# Patient Record
Sex: Female | Born: 1950 | Race: White | Hispanic: No | Smoking: Never smoker
Health system: Southern US, Community
[De-identification: ages and names within clinical notes are randomized; demographics above are authoritative.]

## PROBLEM LIST (undated history)

## (undated) DIAGNOSIS — R739 Hyperglycemia, unspecified: Secondary | ICD-10-CM

## (undated) DIAGNOSIS — I10 Essential (primary) hypertension: Secondary | ICD-10-CM

## (undated) DIAGNOSIS — I4891 Unspecified atrial fibrillation: Secondary | ICD-10-CM

## (undated) DIAGNOSIS — I5032 Chronic diastolic (congestive) heart failure: Secondary | ICD-10-CM

## (undated) DIAGNOSIS — N390 Urinary tract infection, site not specified: Secondary | ICD-10-CM

## (undated) DIAGNOSIS — I517 Cardiomegaly: Secondary | ICD-10-CM

## (undated) DIAGNOSIS — N200 Calculus of kidney: Secondary | ICD-10-CM

## (undated) DIAGNOSIS — E119 Type 2 diabetes mellitus without complications: Secondary | ICD-10-CM

## (undated) DIAGNOSIS — E039 Hypothyroidism, unspecified: Secondary | ICD-10-CM

## (undated) DIAGNOSIS — E663 Overweight: Secondary | ICD-10-CM

## (undated) HISTORY — DX: Calculus of kidney: N20.0

## (undated) HISTORY — DX: Urinary tract infection, site not specified: N39.0

## (undated) HISTORY — PX: THYROIDECTOMY: SHX17

## (undated) HISTORY — DX: Overweight: E66.3

## (undated) HISTORY — DX: Essential (primary) hypertension: I10

---

## 2001-06-14 ENCOUNTER — Ambulatory Visit (HOSPITAL_COMMUNITY): Admission: RE | Admit: 2001-06-14 | Discharge: 2001-06-14 | Payer: Self-pay | Admitting: Internal Medicine

## 2001-06-14 ENCOUNTER — Encounter: Payer: Self-pay | Admitting: Internal Medicine

## 2001-07-16 ENCOUNTER — Encounter: Payer: Self-pay | Admitting: Internal Medicine

## 2001-07-16 ENCOUNTER — Ambulatory Visit (HOSPITAL_COMMUNITY): Admission: RE | Admit: 2001-07-16 | Discharge: 2001-07-16 | Payer: Self-pay | Admitting: Internal Medicine

## 2001-07-18 ENCOUNTER — Encounter: Payer: Self-pay | Admitting: Urology

## 2001-07-18 ENCOUNTER — Ambulatory Visit (HOSPITAL_COMMUNITY): Admission: RE | Admit: 2001-07-18 | Discharge: 2001-07-18 | Payer: Self-pay | Admitting: Urology

## 2001-07-25 ENCOUNTER — Encounter: Payer: Self-pay | Admitting: Urology

## 2001-07-25 ENCOUNTER — Ambulatory Visit (HOSPITAL_COMMUNITY): Admission: RE | Admit: 2001-07-25 | Discharge: 2001-07-25 | Payer: Self-pay | Admitting: *Deleted

## 2001-07-30 ENCOUNTER — Encounter: Payer: Self-pay | Admitting: Urology

## 2001-07-30 ENCOUNTER — Ambulatory Visit (HOSPITAL_COMMUNITY): Admission: RE | Admit: 2001-07-30 | Discharge: 2001-07-30 | Payer: Self-pay | Admitting: Urology

## 2001-08-22 ENCOUNTER — Ambulatory Visit (HOSPITAL_COMMUNITY): Admission: RE | Admit: 2001-08-22 | Discharge: 2001-08-22 | Payer: Self-pay | Admitting: Urology

## 2001-08-22 ENCOUNTER — Encounter: Payer: Self-pay | Admitting: Urology

## 2001-08-24 ENCOUNTER — Ambulatory Visit (HOSPITAL_COMMUNITY): Admission: RE | Admit: 2001-08-24 | Discharge: 2001-08-24 | Payer: Self-pay | Admitting: Urology

## 2001-08-24 ENCOUNTER — Encounter: Payer: Self-pay | Admitting: Urology

## 2001-10-26 ENCOUNTER — Ambulatory Visit (HOSPITAL_COMMUNITY): Admission: RE | Admit: 2001-10-26 | Discharge: 2001-10-26 | Payer: Self-pay | Admitting: Urology

## 2001-10-26 ENCOUNTER — Encounter: Payer: Self-pay | Admitting: Urology

## 2002-06-14 ENCOUNTER — Ambulatory Visit (HOSPITAL_COMMUNITY): Admission: RE | Admit: 2002-06-14 | Discharge: 2002-06-14 | Payer: Self-pay | Admitting: Internal Medicine

## 2002-06-14 ENCOUNTER — Encounter: Payer: Self-pay | Admitting: Internal Medicine

## 2004-10-03 ENCOUNTER — Emergency Department (HOSPITAL_COMMUNITY): Admission: EM | Admit: 2004-10-03 | Discharge: 2004-10-03 | Payer: Self-pay | Admitting: Emergency Medicine

## 2008-08-26 ENCOUNTER — Ambulatory Visit (HOSPITAL_COMMUNITY): Admission: RE | Admit: 2008-08-26 | Discharge: 2008-08-26 | Payer: Self-pay | Admitting: Cardiology

## 2008-08-26 ENCOUNTER — Ambulatory Visit: Payer: Self-pay | Admitting: Cardiology

## 2008-08-27 ENCOUNTER — Ambulatory Visit: Payer: Self-pay | Admitting: Cardiology

## 2008-08-29 ENCOUNTER — Ambulatory Visit: Payer: Self-pay | Admitting: Cardiology

## 2008-09-04 ENCOUNTER — Ambulatory Visit: Payer: Self-pay | Admitting: Cardiology

## 2008-09-05 ENCOUNTER — Encounter: Payer: Self-pay | Admitting: Cardiology

## 2008-09-05 ENCOUNTER — Ambulatory Visit (HOSPITAL_COMMUNITY): Admission: RE | Admit: 2008-09-05 | Discharge: 2008-09-05 | Payer: Self-pay | Admitting: Cardiology

## 2008-09-05 ENCOUNTER — Ambulatory Visit: Payer: Self-pay | Admitting: Cardiology

## 2008-09-10 ENCOUNTER — Ambulatory Visit: Payer: Self-pay | Admitting: Cardiology

## 2008-09-19 ENCOUNTER — Ambulatory Visit: Payer: Self-pay | Admitting: Cardiology

## 2008-10-02 ENCOUNTER — Ambulatory Visit: Payer: Self-pay | Admitting: Cardiology

## 2008-10-13 ENCOUNTER — Ambulatory Visit: Payer: Self-pay | Admitting: Cardiology

## 2008-10-29 ENCOUNTER — Ambulatory Visit: Payer: Self-pay | Admitting: Cardiology

## 2008-11-03 ENCOUNTER — Ambulatory Visit: Payer: Self-pay | Admitting: Cardiology

## 2008-11-10 ENCOUNTER — Ambulatory Visit: Payer: Self-pay | Admitting: Cardiology

## 2008-11-27 ENCOUNTER — Ambulatory Visit: Payer: Self-pay | Admitting: Cardiology

## 2008-12-08 ENCOUNTER — Ambulatory Visit: Payer: Self-pay | Admitting: Cardiology

## 2008-12-17 ENCOUNTER — Ambulatory Visit (HOSPITAL_COMMUNITY): Admission: RE | Admit: 2008-12-17 | Discharge: 2008-12-17 | Payer: Self-pay | Admitting: Cardiology

## 2008-12-17 ENCOUNTER — Ambulatory Visit: Payer: Self-pay | Admitting: Cardiology

## 2008-12-25 ENCOUNTER — Ambulatory Visit: Payer: Self-pay | Admitting: Cardiology

## 2009-01-14 ENCOUNTER — Ambulatory Visit: Payer: Self-pay | Admitting: Cardiology

## 2009-01-22 ENCOUNTER — Ambulatory Visit: Payer: Self-pay | Admitting: Cardiology

## 2009-02-18 ENCOUNTER — Ambulatory Visit: Payer: Self-pay | Admitting: Cardiology

## 2009-03-17 ENCOUNTER — Encounter (INDEPENDENT_AMBULATORY_CARE_PROVIDER_SITE_OTHER): Payer: Self-pay | Admitting: *Deleted

## 2009-03-17 LAB — CONVERTED CEMR LAB
BUN: 27 mg/dL
CO2: 26 meq/L
Calcium: 8.9 mg/dL
Chloride: 104 meq/L
Creatinine, Ser: 0.85 mg/dL
Glucose, Bld: 167 mg/dL
Potassium: 4.3 meq/L
Sodium: 141 meq/L

## 2009-03-19 ENCOUNTER — Ambulatory Visit: Payer: Self-pay | Admitting: Cardiology

## 2009-04-20 ENCOUNTER — Ambulatory Visit: Payer: Self-pay | Admitting: Cardiology

## 2009-05-05 DIAGNOSIS — E1122 Type 2 diabetes mellitus with diabetic chronic kidney disease: Secondary | ICD-10-CM

## 2009-05-05 DIAGNOSIS — N200 Calculus of kidney: Secondary | ICD-10-CM

## 2009-05-05 DIAGNOSIS — N183 Chronic kidney disease, stage 3 (moderate): Secondary | ICD-10-CM

## 2009-05-05 DIAGNOSIS — I10 Essential (primary) hypertension: Secondary | ICD-10-CM

## 2009-05-05 DIAGNOSIS — N390 Urinary tract infection, site not specified: Secondary | ICD-10-CM | POA: Insufficient documentation

## 2009-05-05 DIAGNOSIS — I482 Chronic atrial fibrillation, unspecified: Secondary | ICD-10-CM

## 2009-05-05 DIAGNOSIS — Z9089 Acquired absence of other organs: Secondary | ICD-10-CM

## 2009-05-05 DIAGNOSIS — E1165 Type 2 diabetes mellitus with hyperglycemia: Secondary | ICD-10-CM

## 2009-05-05 DIAGNOSIS — Z6841 Body Mass Index (BMI) 40.0 and over, adult: Secondary | ICD-10-CM

## 2009-05-07 ENCOUNTER — Ambulatory Visit: Payer: Self-pay | Admitting: Cardiology

## 2009-06-08 ENCOUNTER — Ambulatory Visit: Payer: Self-pay | Admitting: Cardiology

## 2009-06-08 ENCOUNTER — Encounter: Payer: Self-pay | Admitting: *Deleted

## 2009-06-08 LAB — CONVERTED CEMR LAB: POC INR: 4.5

## 2009-06-15 ENCOUNTER — Ambulatory Visit: Payer: Self-pay

## 2009-06-15 LAB — CONVERTED CEMR LAB: POC INR: 2.4

## 2009-07-01 ENCOUNTER — Ambulatory Visit: Payer: Self-pay | Admitting: Cardiology

## 2009-07-01 LAB — CONVERTED CEMR LAB: POC INR: 2.8

## 2009-07-23 ENCOUNTER — Ambulatory Visit: Payer: Self-pay | Admitting: Cardiology

## 2009-07-23 LAB — CONVERTED CEMR LAB: POC INR: 2.6

## 2009-08-04 ENCOUNTER — Encounter (INDEPENDENT_AMBULATORY_CARE_PROVIDER_SITE_OTHER): Payer: Self-pay | Admitting: *Deleted

## 2009-08-07 ENCOUNTER — Telehealth (INDEPENDENT_AMBULATORY_CARE_PROVIDER_SITE_OTHER): Payer: Self-pay | Admitting: *Deleted

## 2009-08-20 ENCOUNTER — Ambulatory Visit: Payer: Self-pay | Admitting: Cardiology

## 2009-08-20 LAB — CONVERTED CEMR LAB: POC INR: 3.2

## 2009-08-26 ENCOUNTER — Encounter: Payer: Self-pay | Admitting: Cardiology

## 2009-08-26 ENCOUNTER — Encounter (INDEPENDENT_AMBULATORY_CARE_PROVIDER_SITE_OTHER): Payer: Self-pay | Admitting: *Deleted

## 2009-09-16 ENCOUNTER — Ambulatory Visit: Payer: Self-pay | Admitting: Cardiology

## 2009-09-16 LAB — CONVERTED CEMR LAB: POC INR: 2.4

## 2009-10-14 ENCOUNTER — Ambulatory Visit: Payer: Self-pay | Admitting: Cardiology

## 2009-10-14 LAB — CONVERTED CEMR LAB: POC INR: 2.2

## 2009-11-12 ENCOUNTER — Ambulatory Visit: Payer: Self-pay | Admitting: Cardiology

## 2009-11-12 LAB — CONVERTED CEMR LAB: POC INR: 1.9

## 2009-12-01 ENCOUNTER — Encounter (INDEPENDENT_AMBULATORY_CARE_PROVIDER_SITE_OTHER): Payer: Self-pay | Admitting: *Deleted

## 2009-12-10 ENCOUNTER — Ambulatory Visit: Payer: Self-pay | Admitting: Cardiology

## 2009-12-10 ENCOUNTER — Encounter: Payer: Self-pay | Admitting: Cardiology

## 2009-12-10 LAB — CONVERTED CEMR LAB: POC INR: 2.2

## 2010-01-06 ENCOUNTER — Ambulatory Visit: Payer: Self-pay | Admitting: Cardiology

## 2010-01-06 LAB — CONVERTED CEMR LAB: POC INR: 2.6

## 2010-01-14 ENCOUNTER — Telehealth (INDEPENDENT_AMBULATORY_CARE_PROVIDER_SITE_OTHER): Payer: Self-pay

## 2010-02-04 ENCOUNTER — Ambulatory Visit: Payer: Self-pay | Admitting: Cardiology

## 2010-02-04 LAB — CONVERTED CEMR LAB: POC INR: 2.4

## 2010-03-04 ENCOUNTER — Ambulatory Visit: Payer: Self-pay | Admitting: Cardiology

## 2010-03-04 LAB — CONVERTED CEMR LAB: POC INR: 2.5

## 2010-03-31 ENCOUNTER — Ambulatory Visit: Payer: Self-pay | Admitting: Cardiology

## 2010-03-31 ENCOUNTER — Telehealth (INDEPENDENT_AMBULATORY_CARE_PROVIDER_SITE_OTHER): Payer: Self-pay | Admitting: *Deleted

## 2010-03-31 LAB — CONVERTED CEMR LAB: POC INR: 1.9

## 2010-04-01 ENCOUNTER — Encounter (INDEPENDENT_AMBULATORY_CARE_PROVIDER_SITE_OTHER): Payer: Self-pay | Admitting: *Deleted

## 2010-04-02 ENCOUNTER — Encounter (INDEPENDENT_AMBULATORY_CARE_PROVIDER_SITE_OTHER): Payer: Self-pay | Admitting: *Deleted

## 2010-04-02 DIAGNOSIS — E876 Hypokalemia: Secondary | ICD-10-CM | POA: Insufficient documentation

## 2010-04-07 ENCOUNTER — Telehealth (INDEPENDENT_AMBULATORY_CARE_PROVIDER_SITE_OTHER): Payer: Self-pay | Admitting: *Deleted

## 2010-04-14 ENCOUNTER — Encounter: Payer: Self-pay | Admitting: Cardiology

## 2010-04-15 LAB — CONVERTED CEMR LAB
BUN: 22 mg/dL (ref 6–23)
CO2: 26 meq/L (ref 19–32)
Calcium: 8.9 mg/dL (ref 8.4–10.5)
Chloride: 102 meq/L (ref 96–112)
Creatinine, Ser: 1.05 mg/dL (ref 0.40–1.20)
Glucose, Bld: 256 mg/dL — ABNORMAL HIGH (ref 70–99)
Potassium: 4.1 meq/L (ref 3.5–5.3)
Sodium: 139 meq/L (ref 135–145)

## 2010-04-28 ENCOUNTER — Ambulatory Visit: Payer: Self-pay | Admitting: Cardiovascular Disease

## 2010-04-28 LAB — CONVERTED CEMR LAB: POC INR: 2.6

## 2010-06-02 ENCOUNTER — Ambulatory Visit: Payer: Self-pay | Admitting: Cardiology

## 2010-06-02 LAB — CONVERTED CEMR LAB: POC INR: 1.8

## 2010-06-14 ENCOUNTER — Ambulatory Visit: Payer: Self-pay | Admitting: Cardiology

## 2010-06-14 DIAGNOSIS — E785 Hyperlipidemia, unspecified: Secondary | ICD-10-CM

## 2010-06-23 ENCOUNTER — Ambulatory Visit: Payer: Self-pay | Admitting: Cardiology

## 2010-06-23 LAB — CONVERTED CEMR LAB: POC INR: 2.5

## 2010-06-25 LAB — CONVERTED CEMR LAB
BUN: 22 mg/dL (ref 6–23)
CO2: 30 meq/L (ref 19–32)
Calcium: 8.3 mg/dL — ABNORMAL LOW (ref 8.4–10.5)
Chloride: 103 meq/L (ref 96–112)
Creatinine, Ser: 1.15 mg/dL (ref 0.40–1.20)
Glucose, Bld: 254 mg/dL — ABNORMAL HIGH (ref 70–99)
Potassium: 4 meq/L (ref 3.5–5.3)
Sodium: 145 meq/L (ref 135–145)

## 2010-07-21 ENCOUNTER — Ambulatory Visit: Payer: Self-pay | Admitting: Cardiology

## 2010-07-21 LAB — CONVERTED CEMR LAB: POC INR: 2

## 2010-07-28 LAB — CONVERTED CEMR LAB
ALT: 15 units/L (ref 0–35)
AST: 13 units/L (ref 0–37)
Albumin: 3.7 g/dL (ref 3.5–5.2)
Alkaline Phosphatase: 77 units/L (ref 39–117)
BUN: 21 mg/dL (ref 6–23)
CO2: 26 meq/L (ref 19–32)
Calcium: 8.8 mg/dL (ref 8.4–10.5)
Chloride: 105 meq/L (ref 96–112)
Cholesterol: 212 mg/dL — ABNORMAL HIGH (ref 0–200)
Creatinine, Ser: 1.01 mg/dL (ref 0.40–1.20)
Glucose, Bld: 228 mg/dL — ABNORMAL HIGH (ref 70–99)
HDL: 43 mg/dL (ref >39)
LDL Cholesterol: 136 mg/dL — ABNORMAL HIGH (ref 0–99)
Potassium: 3.8 meq/L (ref 3.5–5.3)
Sodium: 139 meq/L (ref 135–145)
Total Bilirubin: 0.5 mg/dL (ref 0.3–1.2)
Total CHOL/HDL Ratio: 4.9
Total Protein: 6.2 g/dL (ref 6.0–8.3)
Triglycerides: 166 mg/dL — ABNORMAL HIGH (ref <150)
VLDL: 33 mg/dL (ref 0–40)

## 2010-08-18 ENCOUNTER — Ambulatory Visit: Payer: Self-pay | Admitting: Cardiology

## 2010-08-18 LAB — CONVERTED CEMR LAB: POC INR: 2.2

## 2010-09-02 ENCOUNTER — Telehealth (INDEPENDENT_AMBULATORY_CARE_PROVIDER_SITE_OTHER): Payer: Self-pay | Admitting: *Deleted

## 2010-09-15 ENCOUNTER — Ambulatory Visit: Payer: Self-pay

## 2010-09-15 LAB — CONVERTED CEMR LAB: POC INR: 2.2

## 2010-09-23 ENCOUNTER — Telehealth (INDEPENDENT_AMBULATORY_CARE_PROVIDER_SITE_OTHER): Payer: Self-pay

## 2010-09-23 ENCOUNTER — Ambulatory Visit: Payer: Self-pay | Admitting: Cardiology

## 2010-09-23 ENCOUNTER — Encounter (INDEPENDENT_AMBULATORY_CARE_PROVIDER_SITE_OTHER): Payer: Self-pay | Admitting: *Deleted

## 2010-10-13 ENCOUNTER — Ambulatory Visit: Payer: Self-pay | Admitting: Cardiology

## 2010-10-13 LAB — CONVERTED CEMR LAB: POC INR: 2.4

## 2010-11-10 ENCOUNTER — Ambulatory Visit: Admission: RE | Admit: 2010-11-10 | Discharge: 2010-11-10 | Payer: Self-pay | Source: Home / Self Care

## 2010-11-10 LAB — CONVERTED CEMR LAB: POC INR: 1.9

## 2010-11-14 ENCOUNTER — Encounter: Payer: Self-pay | Admitting: Urology

## 2010-11-23 NOTE — Medication Information (Signed)
Summary: ccr-lr  Anticoagulant Therapy  Managed by: Vashti Hey, RN PCP: Dr.Mark Judene Companion MD: Dietrich Pates MD, Molly Maduro Indication 1: Atrial Fibrillation Lab Used: [facility] [place] Site: [place] INR POC 2.5  Dietary changes: no    Health status changes: no    Bleeding/hemorrhagic complications: no    Recent/future hospitalizations: no    Any changes in medication regimen? no    Recent/future dental: no  Any missed doses?: no       Is patient compliant with meds? yes       Allergies: 1)  ! Penicillin  Anticoagulation Management History:      The patient is taking warfarin and comes in today for a routine follow up visit.  Positive risk factors for bleeding include presence of serious comorbidities.  Negative risk factors for bleeding include an age less than 53 years old.  The bleeding index is 'intermediate risk'.  Positive CHADS2 values include History of HTN and History of Diabetes.  Negative CHADS2 values include Age > 31 years old.  The start date was 08/27/2008.  Anticoagulation responsible provider: Dietrich Pates MD, Molly Maduro.  INR POC: 2.5.  Cuvette Lot#: [id number].  Exp: 10/11.    Anticoagulation Management Assessment/Plan:      The patient's current anticoagulation dose is Warfarin sodium 2.5 mg tabs: take as directed by coumadin clinic.  The target INR is 2.0-3.0.  The next INR is due 03/31/2010.  Anticoagulation instructions were given to patient.  Results were reviewed/authorized by Vashti Hey, RN.  She was notified by Vashti Hey RN.         Prior Anticoagulation Instructions: INR 2.4 Continue coumadin 5mg  once daily except 2.5mg  on Mondays and Fridays  Current Anticoagulation Instructions: INR 2.5 Continue coumadin 5mg  once daily except 2.5mg  on Mondays and Fridays Prescriptions: TOPROL XL 200 MG XR24H-TAB (METOPROLOL SUCCINATE) take 1 tab daily  #30 x 6   Entered by:   Vashti Hey RN   Authorized by:   Kathlen Brunswick, MD,  [facility]   Signed by:   Vashti Hey RN on 03/04/2010   Method used:   Electronically to        [facility]  [address] (retail)       [address]       [place], [place]  [address]       Ph: [phone]       Fax: [phone]   RxID:   [id number]

## 2010-11-23 NOTE — Assessment & Plan Note (Signed)
Summary: 6 mth f/u per checkout on 05/07/09/tg  Medications Added WARFARIN SODIUM 2.5 MG TABS (WARFARIN SODIUM) take as directed by coumadin clinic DILTIAZEM HCL ER BEADS 360 MG XR24H-CAP (DILTIAZEM HCL ER BEADS) take 1 tablet by mouth once daily TOPROL XL 200 MG XR24H-TAB (METOPROLOL SUCCINATE) take 1 tab daily GLIMEPIRIDE 2 MG TABS (GLIMEPIRIDE) take 1 tab daily METFORMIN HCL 500 MG TABS (METFORMIN HCL) take 1 tab two times a day LEVOTHYROXINE SODIUM 175 MCG TABS (LEVOTHYROXINE SODIUM) take 1 tab daily TRIAMTERENE-HCTZ 37.5-25 MG TABS (TRIAMTERENE-HCTZ) take 1 tab daily      Allergies Added:   Visit Type:  Follow-up Primary Provider:  Dr.Mark Nobie Putnam  CC:  no complaints today.  History of Present Illness: Amber Armstrong comes in today for further management of her history of atrial fibrillation, hypertension, obesity, mild left ventricular hypertrophy, and anticoagulation.  She's having no significant complaints today. She specifically denies any palpitations, chest pain, increased shortness of breath, orthopnea, or peripheral edema. She checks her blood pressure work and is running about 160  Current Medications (verified): 1)  Warfarin Sodium 2.5 Mg Tabs (Warfarin Sodium) .... Take As Directed By Coumadin Clinic 2)  Diltiazem Hcl Er Beads 360 Mg Xr24h-Cap (Diltiazem Hcl Er Beads) .... Take 1 Tablet By Mouth Once Daily 3)  Toprol Xl 200 Mg Xr24h-Tab (Metoprolol Succinate) .... Take 1 Tab Daily 4)  Glimepiride 2 Mg Tabs (Glimepiride) .... Take 1 Tab Daily 5)  Metformin Hcl 500 Mg Tabs (Metformin Hcl) .... Take 1 Tab Two Times A Day 6)  Levothyroxine Sodium 175 Mcg Tabs (Levothyroxine Sodium) .... Take 1 Tab Daily 7)  Triamterene-Hctz 37.5-25 Mg Tabs (Triamterene-Hctz) .... Take 1 Tab Daily  Allergies (verified): 1)  ! Penicillin  Past History:  Past Medical History: Last updated: 2009/05/15 Current Problems:  NEPHROLITHIASIS (ICD-592.0) HYPERTENSION (ICD-401.9) DIABETES  MELLITUS, TYPE II (ICD-250.00) THYROIDECTOMY, HX OF (ICD-V45.79) OVERWEIGHT (ICD-278.02) UTI (ICD-599.0) FIBRILLATION, ATRIAL (ICD-427.31)  Past Surgical History: Last updated: May 15, 2009 thyroidectomy  Family History: Last updated: 05-15-2009 Father:deceased due to alzhimers Mother:alive with afib  Social History: Last updated: 05/15/2009 Full Time pt works at [employer]  Tobacco Use - No.  Alcohol Use - no Regular Exercise - no Drug Use - no pt has no children  Risk Factors: Exercise: no (2009-05-15)  Risk Factors: Smoking Status: never (2009/05/15)  Review of Systems       negative other than history of present illness  Vital Signs:  Patient profile:   60 year old female Height:      62 inches Weight:      206 pounds BMI:     37.81 Pulse rate:   71 / minute BP sitting:   168 / 88  (right arm)  Vitals Entered By: Dreama Saa, CNA (December 10, 2009 11:02 AM)  Physical Exam  General:  obese.   Head:  normocephalic and atraumatic Eyes:  PERRLA/EOM intact; conjunctiva and lids normal. Mouth:  poor dentition.   Neck:  Neck supple, no JVD. No masses, thyromegaly or abnormal cervical nodes. Lungs:  Clear bilaterally to auscultation and percussion. Heart:  irregular rate and rhythm, variable S1-S2, PMI poorly appreciated Msk:  decreased ROM.   Pulses:  pulses normal in all 4 extremities Extremities:  No clubbing or cyanosis.trace left pedal edema and trace right pedal edema.   Neurologic:  Alert and oriented x 3. Skin:  Intact without lesions or rashes. Psych:  Normal affect.   Impression & Recommendations:  Problem # 1:  FIBRILLATION,  ATRIAL (ICD-427.31) Assessment Unchanged  Her updated medication list for this problem includes:    Warfarin Sodium 2.5 Mg Tabs (Warfarin sodium) .Marland Kitchen... Take as directed by coumadin clinic    Toprol Xl 200 Mg Xr24h-tab (Metoprolol succinate) .Marland Kitchen... Take 1 tab daily  Problem # 2:  HYPERTENSION  (ICD-401.9) Assessment: Deteriorated Herblood pressure running high. She is taking diltiazem extended release 180 twice a day. We'll change to 360 q.a.m. She return in about 2 weeks for blood pressure check. Her updated medication list for this problem includes:    Diltiazem Hcl Er Beads 360 Mg Xr24h-cap (Diltiazem hcl er beads) .Marland Kitchen... Take 1 tablet by mouth once daily    Toprol Xl 200 Mg Xr24h-tab (Metoprolol succinate) .Marland Kitchen... Take 1 tab daily    Triamterene-hctz 37.5-25 Mg Tabs (Triamterene-hctz) .Marland Kitchen... Take 1 tab daily  Patient Instructions: 1)  Your physician recommends that you schedule a follow-up appointment in: 6 months 2)  Your physician has recommended you make the following change in your medication: Increase Diltiazem to 360mg  by mouth once daily  Prescriptions: DILTIAZEM HCL ER BEADS 360 MG XR24H-CAP (DILTIAZEM HCL ER BEADS) take 1 tablet by mouth once daily  #30 x 6   Entered by:   Larita Fife Via LPN   Authorized by:   Gaylord Shih, MD, [facility]   Signed by:   Larita Fife Via LPN on 16/07/9603   Method used:   Electronically to        [facility]  [place] [address] (retail)       [address]       [place], [place]  [address]       Ph: [phone]       Fax: [phone]   RxID:   [id number] WARFARIN SODIUM 2.5 MG TABS (WARFARIN SODIUM) take as directed by coumadin clinic  #45 x 3   Entered by:   Larita Fife Via LPN   Authorized by:   Gaylord Shih, MD, [facility]   Signed by:   Larita Fife Via LPN on 08/20/2535   Method used:   Electronically to        [facility]  [address] [address] (retail)       [address]       [place], [place]  [address]       Ph: [phone]       Fax: [phone]   RxID:   [id number]

## 2010-11-23 NOTE — Medication Information (Signed)
Summary: ccr-lr  Anticoagulant Therapy  Managed by: Vashti Hey, RN PCP: Dr Wyvonnia Dusky MD: Dietrich Pates MD, Molly Maduro Indication 1: Atrial Fibrillation Lab Used: [facility] [place] Site: [place] INR POC 2.0  Dietary changes: no    Health status changes: no    Bleeding/hemorrhagic complications: no    Recent/future hospitalizations: no    Any changes in medication regimen? no    Recent/future dental: no  Any missed doses?: no       Is patient compliant with meds? yes       Allergies: 1)  ! Penicillin  Anticoagulation Management History:      The patient is taking warfarin and comes in today for a routine follow up visit.  Positive risk factors for bleeding include presence of serious comorbidities.  Negative risk factors for bleeding include an age less than 24 years old.  The bleeding index is 'intermediate risk'.  Positive CHADS2 values include History of HTN and History of Diabetes.  Negative CHADS2 values include Age > 85 years old.  The start date was 08/27/2008.  Anticoagulation responsible provider: Dietrich Pates MD, Molly Maduro.  INR POC: 2.0.  Cuvette Lot#: [id number].  Exp: 10/11.    Anticoagulation Management Assessment/Plan:      The patient's current anticoagulation dose is Warfarin sodium 2.5 mg tabs: take as directed by coumadin clinic.  The target INR is 2.0-3.0.  The next INR is due 08/18/2010.  Anticoagulation instructions were given to patient.  Results were reviewed/authorized by Vashti Hey, RN.  She was notified by Vashti Hey RN.         Prior Anticoagulation Instructions: INR 2.5 Continue coumadin 5mg  once daily except 2.5mg  on Mondays and Fridays  Current Anticoagulation Instructions: INR 2.0 Continue coumadin 5mg  once daily except 2.5mg  on Mondays and Fridays

## 2010-11-23 NOTE — Progress Notes (Signed)
**Note De-Identified Amber Armstrong Obfuscation** Summary: Medication Question   Phone Note Call from Patient   Caller: Patient Reason for Call: Talk to Nurse Summary of Call: patient would like return phone call regarding medicine that Dr.Wall wanted to start her on today / said it had Calcium in it and she couldn't take anything with Calcium in it / tg Initial call taken by: Raechel Ache [facility],  September 23, 2010 1:04 PM  Follow-up for Phone Call        Per Dr. Daleen Squibb, pt. is advised Atorvastatin is a calcium compound and is ok for her to take. Pt. states she understands. Follow-up by: Larita Fife Arman Loy LPN,  September 23, 2010 1:35 PM

## 2010-11-23 NOTE — Progress Notes (Signed)
   Labs faxed to [facility] @ [phone] [facility]  September 02, 2010 11:50 AM

## 2010-11-23 NOTE — Progress Notes (Signed)
   Phone Note Call from Patient   Caller: Patient Call For: triam/hctz 37.5/25 Summary of Call: [facility] will not be carrying triam/hctz anymore, takes 1/2 tablet daily pt doesn't want to tx meds anywhere else  would like for you to try her on something else pt only has 1 pill left for tomorrow  please advise asap Initial call taken by: Teressa Lower RN,  March 31, 2010 11:47 AM  Follow-up for Phone Call        change to HCTZ 25mg /d and KCL rich diet, Check BMET in 2 weeks. Follow-up by: Gaylord Shih, MD, [facility],  April 01, 2010 3:31 PM

## 2010-11-23 NOTE — Letter (Signed)
Summary: [place] [employer] [facility] [employer], agricultural at [employer]  [address], [place] [address]   Phone: [phone]  Fax: [phone]     September 23, 2010 MRN: [id number]   [facility] [address] [place], [place]  [address]      YOUR LAB WORK IS DUE   November 13, 2010  Please go to [facility], located across the street from [facility] on the second floor.  Hours are Monday - Friday 7am until 7:30pm         Saturday 8am until 12noon    _X_  DO NOT EAT OR DRINK AFTER MIDNIGHT EVENING PRIOR TO LABWORK

## 2010-11-23 NOTE — Letter (Signed)
Summary: Handout Printed  Printed Handout:  - Diet - Sodium-Controlled 

## 2010-11-23 NOTE — Medication Information (Signed)
Summary: ccr-lr  Anticoagulant Therapy  Managed by: Vashti Hey, RN Supervising MD: Dietrich Pates MD, Molly Maduro Indication 1: Atrial Fibrillation Lab Used: [facility] [place] Site: [place] INR POC 1.9  Dietary changes: no    Health status changes: no    Bleeding/hemorrhagic complications: no    Recent/future hospitalizations: no    Any changes in medication regimen? no    Recent/future dental: no  Any missed doses?: no       Is patient compliant with meds? yes       Allergies: 1)  ! Penicillin  Anticoagulation Management History:      The patient is taking warfarin and comes in today for a routine follow up visit.  Positive risk factors for bleeding include presence of serious comorbidities.  Negative risk factors for bleeding include an age less than 67 years old.  The bleeding index is 'intermediate risk'.  Positive CHADS2 values include History of HTN and History of Diabetes.  Negative CHADS2 values include Age > 1 years old.  The start date was 08/27/2008.  Anticoagulation responsible provider: Dietrich Pates MD, Molly Maduro.  INR POC: 1.9.  Cuvette Lot#: [id number].  Exp: 10/11.    Anticoagulation Management Assessment/Plan:      The patient's current anticoagulation dose is Warfarin sodium 2.5 mg tabs: Marland Kitchen  The target INR is 2.0-3.0.  The next INR is due 12/10/2009.  Anticoagulation instructions were given to patient.  Results were reviewed/authorized by Vashti Hey, RN.  She was notified by Vashti Hey RN.         Prior Anticoagulation Instructions: INR 2.2 Continue coumadin 5mg  once daily except 2.5mg  on Mondays and Fridays  Current Anticoagulation Instructions: INR 1.9 Take coumadin 3 tablets tonight then resume 2 tablets once daily except 1 tablet on Mondays and Fridays

## 2010-11-23 NOTE — Medication Information (Signed)
Summary: PROTIME/TG  Anticoagulant Therapy  Managed by: Vashti Hey, RN PCP: Dr Wyvonnia Dusky MD: Diona Browner MD, Remi Deter Indication 1: Atrial Fibrillation Lab Used: [facility] [place] Site: [place] INR POC 1.8  Dietary changes: no    Health status changes: no    Bleeding/hemorrhagic complications: no    Recent/future hospitalizations: no    Any changes in medication regimen? no    Recent/future dental: no  Any missed doses?: no       Is patient compliant with meds? yes       Allergies: 1)  ! Penicillin  Anticoagulation Management History:      The patient is taking warfarin and comes in today for a routine follow up visit.  Positive risk factors for bleeding include presence of serious comorbidities.  Negative risk factors for bleeding include an age less than 38 years old.  The bleeding index is 'intermediate risk'.  Positive CHADS2 values include History of HTN and History of Diabetes.  Negative CHADS2 values include Age > 80 years old.  The start date was 08/27/2008.  Anticoagulation responsible provider: Diona Browner MD, Remi Deter.  INR POC: 1.8.  Cuvette Lot#: [id number].  Exp: 10/11.    Anticoagulation Management Assessment/Plan:      The patient's current anticoagulation dose is Warfarin sodium 2.5 mg tabs: take as directed by coumadin clinic.  The target INR is 2.0-3.0.  The next INR is due 06/23/2010.  Anticoagulation instructions were given to patient.  Results were reviewed/authorized by Vashti Hey, RN.  She was notified by Vashti Hey RN.         Prior Anticoagulation Instructions: INR 2.6 Continue coumadin 5mg  once daily except 2.5mg  on Mondays and Fridays  Current Anticoagulation Instructions: INR 1.8 Take coumadin 3 tablets tonight and tomorrow night then resume 2 tablets once daily except 1 tablet on Mondays and Fridays

## 2010-11-23 NOTE — Medication Information (Signed)
Summary: ccr-lr  Anticoagulant Therapy  Managed by: Vashti Hey, RN Supervising MD: Daleen Squibb MD, Maisie Fus Indication 1: Atrial Fibrillation Lab Used: [facility]  Site: [place] INR POC 2.2  Dietary changes: no    Health status changes: no    Bleeding/hemorrhagic complications: no    Recent/future hospitalizations: no    Any changes in medication regimen? no    Recent/future dental: no  Any missed doses?: no       Is patient compliant with meds? yes       Allergies: 1)  ! Penicillin  Anticoagulation Management History:      The patient is taking warfarin and comes in today for a routine follow up visit.  Positive risk factors for bleeding include presence of serious comorbidities.  Negative risk factors for bleeding include an age less than 50 years old.  The bleeding index is 'intermediate risk'.  Positive CHADS2 values include History of HTN and History of Diabetes.  Negative CHADS2 values include Age > 42 years old.  The start date was 08/27/2008.  Anticoagulation responsible provider: Daleen Squibb MD, Maisie Fus.  INR POC: 2.2.  Cuvette Lot#: [id number].  Exp: 10/11.    Anticoagulation Management Assessment/Plan:      The patient's current anticoagulation dose is Warfarin sodium 2.5 mg tabs: Marland Kitchen  The target INR is 2.0-3.0.  The next INR is due 01/06/2010.  Anticoagulation instructions were given to patient.  Results were reviewed/authorized by Vashti Hey, RN.  She was notified by Vashti Hey RN.         Prior Anticoagulation Instructions: INR 1.9 Take coumadin 3 tablets tonight then resume 2 tablets once daily except 1 tablet on Mondays and Fridays  Current Anticoagulation Instructions: INR 2.2 Continue coumadin 5mg  once daily except 2.5mg  on Mondays and Fridays

## 2010-11-23 NOTE — Assessment & Plan Note (Signed)
Summary: 1-2 wk bp check per checkout on 06/14/10/tg  Nurse Visit   Vital Signs:  Patient profile:   60 year old female Weight:      201 pounds O2 Sat:      97 % Pulse rate:   65 / minute BP sitting:   177 / 91  (left arm)  Vitals Entered ByLarita Fife Via LPN (June 23, 2010 11:08 AM)  Primary Provider:  Dr Sherwood Gambler   History of Present Illness: Pt. arrives in office for BP check/nurse visit. On last OV with Dr. Daleen Squibb on 06-14-10, pt. was advised to stop Triamterine and start taking Furosemide 20mg  by mouth once daily and to decrease Simvastatin 10mg  at bedtime. Pt. c/o swelling in ankles, otherwise she has no complaints at this time. BP this morning is 177/91. Pt. states that her BP is not normally this high, she checks often at [facility] and states readings range from 150/70 to 160/77.  Pt. advised to elevate feet while at rest and that we will call her with Dr. Vern Claude recommendation's, if any.   Allergies: 1)  ! Penicillin

## 2010-11-23 NOTE — Medication Information (Signed)
Summary: ccr-lr  Anticoagulant Therapy  Managed by: Vashti Hey, RN PCP: Dr Wyvonnia Dusky MD: Eden Emms MD, Theron Arista Indication 1: Atrial Fibrillation Lab Used: [facility] [place] Site: [place] INR POC 2.6  Dietary changes: no    Health status changes: no    Bleeding/hemorrhagic complications: no    Recent/future hospitalizations: no    Any changes in medication regimen? no    Recent/future dental: no  Any missed doses?: no       Is patient compliant with meds? yes       Allergies: 1)  ! Penicillin  Anticoagulation Management History:      The patient is taking warfarin and comes in today for a routine follow up visit.  Positive risk factors for bleeding include presence of serious comorbidities.  Negative risk factors for bleeding include an age less than 45 years old.  The bleeding index is 'intermediate risk'.  Positive CHADS2 values include History of HTN and History of Diabetes.  Negative CHADS2 values include Age > 63 years old.  The start date was 08/27/2008.  Anticoagulation responsible provider: Eden Emms MD, Theron Arista.  INR POC: 2.6.  Cuvette Lot#: [id number].  Exp: 10/11.    Anticoagulation Management Assessment/Plan:      The patient's current anticoagulation dose is Warfarin sodium 2.5 mg tabs: take as directed by coumadin clinic.  The target INR is 2.0-3.0.  The next INR is due 06/02/2010.  Anticoagulation instructions were given to patient.  Results were reviewed/authorized by Vashti Hey, RN.  She was notified by Vashti Hey RN.         Prior Anticoagulation Instructions: INR 1.9 Take coumadin 3 tablets tonight then resume 5mg  once daily except 2.5mg  on Mondays and Fridays  Current Anticoagulation Instructions: INR 2.6 Continue coumadin 5mg  once daily except 2.5mg  on Mondays and Fridays Prescriptions: WARFARIN SODIUM 2.5 MG TABS (WARFARIN SODIUM) take as directed by coumadin clinic  #45 x 3   Entered by:   Vashti Hey RN   Authorized by:    Kathlen Brunswick, MD, [facility]   Signed by:   Vashti Hey RN on 04/28/2010   Method used:   Electronically to        [facility]  [address] [place] [address] (retail)       [address]       [place], [place]  [address]       Ph: [phone]       Fax: [phone]   RxID:   [id number]

## 2010-11-23 NOTE — Assessment & Plan Note (Signed)
Summary: 6 mth f/u per checkout on 12/10/09/tg  Medications Added SIMVASTATIN 10 MG TABS (SIMVASTATIN) take 1 tablet by mouth at bedtime CINNAMON 500 MG TABS (CINNAMON) TAKE 2 TABS DAILY RA FISH OIL 1000 MG CAPS (OMEGA-3 FATTY ACIDS) TAKE 1 TAB DAILY FUROSEMIDE 20 MG TABS (FUROSEMIDE) take 1 tablet by mouth at bedtime      Allergies Added:   Visit Type:  Follow-up Primary Provider:  Dr Sherwood Gambler  CC:  EDEMA IN FEET AND ANKLES.  History of Present Illness: Amber Armstrong returns today for evaluation and management of her atrial fibrillation, hypertension, obesity, and mixed hyperlipidemia.  Her weight has gone down 7 pounds. She is very compliant with her medications. By history at least, she seems to avoid sodium.  Her blood pressure continues to be high in the office. She is quite anxious.  Her biggest complaint today is that of edema in her legs.  She denies any melena or hematochezia  She has normal left ventricular systolic function by echocardiogram November of 2009. At that time she also had a moderately dilated right ventricle with hyperdynamic right ventricular function a right ventricular hypertrophy. She had moderate pulmonary hypertension. Right atrium is also dilated.  She denies any symptoms of atrial fibrillation  Current Medications (verified): 1)  Warfarin Sodium 2.5 Mg Tabs (Warfarin Sodium) .... Take As Directed By Coumadin Clinic 2)  Diltiazem Hcl Er Beads 360 Mg Xr24h-Cap (Diltiazem Hcl Er Beads) .... Take 1 Tablet By Mouth Once Daily 3)  Toprol Xl 200 Mg Xr24h-Tab (Metoprolol Succinate) .... Take 1 Tab Daily 4)  Glimepiride 2 Mg Tabs (Glimepiride) .... Take 1 Tab Daily 5)  Metformin Hcl 500 Mg Tabs (Metformin Hcl) .... Take 1 Tab Two Times A Day 6)  Levothyroxine Sodium 175 Mcg Tabs (Levothyroxine Sodium) .... Take 1 Tab Daily 7)  Simvastatin 10 Mg Tabs (Simvastatin) .... Take 1 Tablet By Mouth At Bedtime 8)  Cinnamon 500 Mg Tabs (Cinnamon) .... Take 2 Tabs  Daily 9)  Ra Fish Oil 1000 Mg Caps (Omega-3 Fatty Acids) .... Take 1 Tab Daily 10)  Furosemide 20 Mg Tabs (Furosemide) .... Take 1 Tablet By Mouth At Bedtime  Allergies (verified): 1)  ! Penicillin  Past History:  Past Medical History: Last updated: 05/16/2009 Current Problems:  NEPHROLITHIASIS (ICD-592.0) HYPERTENSION (ICD-401.9) DIABETES MELLITUS, TYPE II (ICD-250.00) THYROIDECTOMY, HX OF (ICD-V45.79) OVERWEIGHT (ICD-278.02) UTI (ICD-599.0) FIBRILLATION, ATRIAL (ICD-427.31)  Past Surgical History: Last updated: 2009/05/16 thyroidectomy  Family History: Last updated: May 16, 2009 Father:deceased due to alzhimers Mother:alive with afib  Social History: Last updated: May 16, 2009 Full Time pt works at [employer]  Tobacco Use - No.  Alcohol Use - no Regular Exercise - no Drug Use - no pt has no children  Risk Factors: Exercise: no (May 16, 2009)  Risk Factors: Smoking Status: never (2009/05/16)  Review of Systems       negative other history of present illness  Vital Signs:  Patient profile:   60 year old female Weight:      199 pounds Pulse rate:   70 / minute BP sitting:   165 / 91  (right arm)  Vitals Entered By: Dreama Saa, CNA (June 14, 2010 10:33 AM)  Physical Exam  General:  obese, no acute distress Head:  normocephalic and atraumatic Eyes:  glasses otherwise normal Mouth:  poor dentition.  poor dentition.   Neck:  Neck supple, no JVD. No masses, thyromegaly or abnormal cervical nodes. Chest Wall:  no deformities or breast masses noted Lungs:  Clear  bilaterally to auscultation and percussion. Heart:  PMI poorly appreciated, regular rate and rhythm, normal S1-S2 Msk:  decreased ROM.  decreased ROM.   Pulses:  pulses normal in all 4 extremities Extremities:  2+ left pedal edema and 2+ right pedal edema.  2+ left pedal edema and 2+ right pedal edema.   Neurologic:  Alert and oriented x 3. Skin:  Intact without lesions or  rashes. Psych:  Normal affect.   Problems:  Medical Problems Added: 1)  Dx of Hyperlipidemia  (ICD-272.4)  Impression & Recommendations:  Problem # 1:  FIBRILLATION, ATRIAL (ICD-427.31) Assessment Improved  Her updated medication list for this problem includes:    Warfarin Sodium 2.5 Mg Tabs (Warfarin sodium) .Marland Kitchen... Take as directed by coumadin clinic    Toprol Xl 200 Mg Xr24h-tab (Metoprolol succinate) .Marland Kitchen... Take 1 tab daily  Problem # 2:  HYPERTENSION (ICD-401.9) Assessment: Deteriorated Her blood pressure remains elevated. Salt restriction reviewed. Under change her Lasix from her other diuretic. Blood pressure check in 2 weeks with the nurses. Followup blood work in 6 weeks. The following medications were removed from the medication list:    Triamterene-hctz 37.5-25 Mg Tabs (Triamterene-hctz) .Marland Kitchen... Take 1 tablet by mouth once a day    Losartan Potassium 50 Mg Tabs (Losartan potassium) .Marland Kitchen... Take 1 tablet daily Her updated medication list for this problem includes:    Diltiazem Hcl Er Beads 360 Mg Xr24h-cap (Diltiazem hcl er beads) .Marland Kitchen... Take 1 tablet by mouth once daily    Toprol Xl 200 Mg Xr24h-tab (Metoprolol succinate) .Marland Kitchen... Take 1 tab daily    Furosemide 20 Mg Tabs (Furosemide) .Marland Kitchen... Take 1 tablet by mouth at bedtime  Problem # 3:  DIABETES MELLITUS, TYPE II (ICD-250.00) Assessment: Unchanged  The following medications were removed from the medication list:    Losartan Potassium 50 Mg Tabs (Losartan potassium) .Marland Kitchen... Take 1 tablet daily Her updated medication list for this problem includes:    Glimepiride 2 Mg Tabs (Glimepiride) .Marland Kitchen... Take 1 tab daily    Metformin Hcl 500 Mg Tabs (Metformin hcl) .Marland Kitchen... Take 1 tab two times a day  Problem # 4:  OVERWEIGHT (ICD-278.02) Assessment: Improved she has lost 7 pounds. Congratulations given  Problem # 5:  HYPOKALEMIA, MILD (ICD-276.8)  with That changed to Lasix, will followup potassium level in a week to 10 days. She'll  probably need potassium replacement. Another option would be to put her on spironolactone which would be potassium sparing and also perhaps help her blood pressure and edema with Lasix  Future Orders: T-Basic Metabolic Panel [id number]) ... 06/24/2010  Problem # 6:  HYPERLIPIDEMIA (ICD-272.4)  With increase in her action Will to diltiazem and simvastatin, we'll decrease her simvastatin 10 mg. When I see her back we may switch her Lipitor which will go generic later this year. Her updated medication list for this problem includes:    Simvastatin 10 Mg Tabs (Simvastatin) .Marland Kitchen... Take 1 tablet by mouth at bedtime  Her updated medication list for this problem includes:    Simvastatin 10 Mg Tabs (Simvastatin) .Marland Kitchen... Take 1 tablet by mouth at bedtime  Future Orders: T-Comprehensive Metabolic Panel ([id number]) ... 07/26/2010 T-Lipid Profile [id number]) ... 07/26/2010  Patient Instructions: 1)  Your physician recommends that you schedule a follow-up appointment in: 3 months 2)  Your physician recommends that you return for lab work in: 10 days and 7 weeks 3)  Your physician has recommended you make the following change in your medication: Stop taking Triamterine, start taking Furosemide  20mg  by mouth once daily and decrease Simvastatin to 10mg  by mouth at bedtime  4)  Your physician has requested that you limit the intake of sodium (salt) in your diet to four grams daily. Please see [facility] handout. 5)  Congratulations on your weight loss and we encourage you to continue weight loss effort. Good job. Prescriptions: SIMVASTATIN 10 MG TABS (SIMVASTATIN) take 1 tablet by mouth at bedtime  #30 x 3   Entered by:   Larita Fife Via LPN   Authorized by:   Gaylord Shih, MD, [facility]   Signed by:   Larita Fife Via LPN on 16/07/9603   Method used:   Electronically to        [facility]  [address] (retail)       [address]       [place], [place]  [address]       Ph: [phone]       Fax:  [phone]   RxID:   [id number] FUROSEMIDE 20 MG TABS (FUROSEMIDE) take 1 tablet by mouth at bedtime  #30 x 3   Entered by:   Larita Fife Via LPN   Authorized by:   Gaylord Shih, MD, [facility]   Signed by:   Larita Fife Via LPN on 08/20/2535   Method used:   Electronically to        [facility]  [address] (retail)       [address]       [place], [place]  [address]       Ph: [phone]       Fax: [phone]   RxID:   [id number]

## 2010-11-23 NOTE — Letter (Signed)
Summary: Work Writer at [employer]  [address] [place] [address]   Phone: [phone]  Fax: [phone]     December 10, 2009    [facility]   The above named patient had a medical visit today at:  11:00am.  Please take this into consideration that patient is taking  Furosemide (fluid pill) and must be allowed to go to bathroom as she needs to.     Sincerely yours,  Architectural technologist

## 2010-11-23 NOTE — Medication Information (Signed)
Summary: ccr-lr  Anticoagulant Therapy  Managed by: Vashti Hey, RN PCP: Dr Wyvonnia Dusky MD: Diona Browner MD, Remi Deter Indication 1: Atrial Fibrillation Lab Used: [facility] [place] Site: [place] INR POC 2.2  Dietary changes: no    Health status changes: no    Bleeding/hemorrhagic complications: no    Recent/future hospitalizations: no    Any changes in medication regimen? no    Recent/future dental: no  Any missed doses?: no       Is patient compliant with meds? yes       Allergies: 1)  ! Penicillin  Anticoagulation Management History:      The patient is taking warfarin and comes in today for a routine follow up visit.  Positive risk factors for bleeding include presence of serious comorbidities.  Negative risk factors for bleeding include an age less than 79 years old.  The bleeding index is 'intermediate risk'.  Positive CHADS2 values include History of HTN and History of Diabetes.  Negative CHADS2 values include Age > 35 years old.  The start date was 08/27/2008.  Anticoagulation responsible provider: Diona Browner MD, Remi Deter.  INR POC: 2.2.  Cuvette Lot#: [id number].  Exp: 10/11.    Anticoagulation Management Assessment/Plan:      The patient's current anticoagulation dose is Warfarin sodium 2.5 mg tabs: take as directed by coumadin clinic.  The target INR is 2.0-3.0.  The next INR is due 10/13/2010.  Anticoagulation instructions were given to patient.  Results were reviewed/authorized by Vashti Hey, RN.  She was notified by Vashti Hey RN.         Prior Anticoagulation Instructions: INR 2.2 Continue coumadin 5mg  once daily except 2.5mg  on Mondays and Fridays  Current Anticoagulation Instructions: Same as Prior Instructions.

## 2010-11-23 NOTE — Miscellaneous (Signed)
  Clinical Lists Changes  Problems: Added new problem of HYPOKALEMIA, MILD (ICD-276.8) Medications: Changed medication from TRIAMTERENE-HCTZ 37.5-25 MG TABS (TRIAMTERENE-HCTZ) take 1 tab daily to HYDROCHLOROTHIAZIDE 25 MG TABS (HYDROCHLOROTHIAZIDE) Take one tablet by mouth daily. - Signed Rx of HYDROCHLOROTHIAZIDE 25 MG TABS (HYDROCHLOROTHIAZIDE) Take one tablet by mouth daily.;  #30 x 3;  Signed;  Entered by: Teressa Lower RN;  Authorized by: Gaylord Shih, MD, [facility];  Method used: Electronically to [facility] [address] [place] [address], [place], [place], [place]  [address], Ph: [phone], Fax: [phone] Orders: Added new Test order of T-Basic Metabolic Panel [id number]) - Signed    Prescriptions: HYDROCHLOROTHIAZIDE 25 MG TABS (HYDROCHLOROTHIAZIDE) Take one tablet by mouth daily.  #30 x 3   Entered by:   Teressa Lower RN   Authorized by:   Gaylord Shih, MD, [facility]   Signed by:   Teressa Lower RN on 04/01/2010   Method used:   Electronically to        [facility]  [address] (retail)       [address]       [place], [place]  [address]       Ph: [phone]       Fax: [phone]   RxID:   [id number]  I spoke with pt mother, gave instructions for pt to family member, called in rx to drug store   Teressa Lower RN  April 02, 2010 11:39 AM

## 2010-11-23 NOTE — Miscellaneous (Signed)
Summary: LABS BMP,03/17/2009  Clinical Lists Changes  Observations: Added new observation of CALCIUM: 8.9 mg/dL (95/62/1308 65:78) Added new observation of CREATININE: 0.85 mg/dL (46/96/2952 84:13) Added new observation of BUN: 27 mg/dL (24/40/1027 25:36) Added new observation of BG RANDOM: 167 mg/dL (64/40/3474 25:95) Added new observation of CO2 PLSM/SER: 26 meq/L (03/17/2009 10:35) Added new observation of CL SERUM: 104 meq/L (03/17/2009 10:35) Added new observation of K SERUM: 4.3 meq/L (03/17/2009 10:35) Added new observation of NA: 141 meq/L (03/17/2009 10:35)

## 2010-11-23 NOTE — Assessment & Plan Note (Signed)
Summary: 3 mth f/u per checkout on 06/14/10/tg  Medications Added TOPROL XL 200 MG XR24H-TAB (METOPROLOL SUCCINATE) take 1 tab daily GLIMEPIRIDE 4 MG TABS (GLIMEPIRIDE) take 1 tab daily LIPITOR 10 MG TABS (ATORVASTATIN CALCIUM) take 1 tablet by mouth at bedtime      Allergies Added:   Visit Type:  Follow-up Primary Provider:  Dr Sherwood Gambler  CC:  no cardiology complaints.  History of Present Illness: Ms Pola comes in today for followup of her history of atrial for relation, hypertension, and hyperlipidemia.  She offers no complaints today. She did not take her medicines until 30 minutes before arrival. She works in crazy shifts.  When carefully questioned, she only takes a half of her Toprol the morning and half in the evening. That's because she cannot swallow whole pill.  We note also she is on simvastatin and diltiazem. She is on 20 mg of simvastatin, max suggested 10 mg.  She denies any palpitations, syncope, chest pain, orthopnea, PND, or edema.  Current Medications (verified): 1)  Warfarin Sodium 2.5 Mg Tabs (Warfarin Sodium) .... Take As Directed By Coumadin Clinic 2)  Diltiazem Hcl Er Beads 360 Mg Xr24h-Cap (Diltiazem Hcl Er Beads) .... Take 1 Tablet By Mouth Once Daily 3)  Toprol Xl 200 Mg Xr24h-Tab (Metoprolol Succinate) .... Take 1 Tab Daily 4)  Glimepiride 4 Mg Tabs (Glimepiride) .... Take 1 Tab Daily 5)  Metformin Hcl 500 Mg Tabs (Metformin Hcl) .... Take 1 Tab Two Times A Day 6)  Levothyroxine Sodium 175 Mcg Tabs (Levothyroxine Sodium) .... Take 1 Tab Daily 7)  Cinnamon 500 Mg Tabs (Cinnamon) .... Take 2 Tabs Daily 8)  Ra Fish Oil 1000 Mg Caps (Omega-3 Fatty Acids) .... Take 1 Tab Daily 9)  Furosemide 20 Mg Tabs (Furosemide) .... Take 1 Tablet By Mouth At Bedtime 10)  Lipitor 10 Mg Tabs (Atorvastatin Calcium) .... Take 1 Tablet By Mouth At Bedtime  Allergies (verified): 1)  ! Penicillin  Comments:  Nurse/Medical Assistant: patient brought med bottles the only med  she forgot at home is her simvastatin 20 mg at bedtime  Past History:  Past Medical History: Last updated: 2009-05-21 Current Problems:  NEPHROLITHIASIS (ICD-592.0) HYPERTENSION (ICD-401.9) DIABETES MELLITUS, TYPE II (ICD-250.00) THYROIDECTOMY, HX OF (ICD-V45.79) OVERWEIGHT (ICD-278.02) UTI (ICD-599.0) FIBRILLATION, ATRIAL (ICD-427.31)  Past Surgical History: Last updated: May 21, 2009 thyroidectomy  Family History: Last updated: 2009/05/21 Father:deceased due to alzhimers Mother:alive with afib  Social History: Last updated: May 21, 2009 Full Time pt works at [employer]  Tobacco Use - No.  Alcohol Use - no Regular Exercise - no Drug Use - no pt has no children  Risk Factors: Exercise: no (21-May-2009)  Risk Factors: Smoking Status: never (May 21, 2009)  Review of Systems       negative other than history of present illness  Vital Signs:  Patient profile:   60 year old female Weight:      201 pounds O2 Sat:      97 % on Room air Pulse rate:   73 / minute BP sitting:   195 / 102  (right arm)  Vitals Entered By: Dreama Saa, CNA (September 23, 2010 11:42 AM)  O2 Flow:  Room air  Physical Exam  General:  morbidly obese, no acute distress Head:  normocephalic and atraumatic Eyes:  PERRLA/EOM intact; conjunctiva and lids normal. Neck:  Neck supple, no JVD. No masses, thyromegaly or abnormal cervical nodes. Lungs:  Clear bilaterally to auscultation and percussion. Heart:  PMI difficult to appreciate, normal S1-S2  with some irregularity, no murmur, no carotid bruit Msk:  Back normal, normal gait. Muscle strength and tone normal. Pulses:  pulses normal in all 4 extremities Extremities:  No clubbing or cyanosis. Neurologic:  Alert and oriented x 3. Skin:  Intact without lesions or rashes. Psych:  Normal affect.   Impression & Recommendations:  Problem # 1:  HYPERLIPIDEMIA (ICD-272.4) Will change her simvastatin 2 atorvastatin to 10 mg a day.  Check labs in 6 weeks. The following medications were removed from the medication list:    Simvastatin 20 Mg Tabs (Simvastatin) .Marland Kitchen... Take 1 tablet by mouth at bedtime Her updated medication list for this problem includes:    Lipitor 10 Mg Tabs (Atorvastatin calcium) .Marland Kitchen... Take 1 tablet by mouth at bedtime  Future Orders: T-Lipid Profile ([id number]) ... 11/04/2010 T-Hepatic Function [id number]) ... 11/04/2010  Problem # 2:  HYPERTENSION (ICD-401.9) Assessment: Deteriorated I have asked her to take both the Toprol and diltiazem at night and continue take furosemide in the morning. We also asked her to take both halves of her Toprol at night for a total of 200 mg. She will check her blood pressure on a regular basis. Her updated medication list for this problem includes:    Diltiazem Hcl Er Beads 360 Mg Xr24h-cap (Diltiazem hcl er beads) .Marland Kitchen... Take 1 tablet by mouth once daily    Toprol Xl 200 Mg Xr24h-tab (Metoprolol succinate) .Marland Kitchen... Take 1 tab daily    Furosemide 20 Mg Tabs (Furosemide) .Marland Kitchen... Take 1 tablet by mouth at bedtime  Problem # 3:  DIABETES MELLITUS, TYPE II (ICD-250.00) tight blood sugar control emphasized to avoid micro-and macrovascular complications Her updated medication list for this problem includes:    Glimepiride 4 Mg Tabs (Glimepiride) .Marland Kitchen... Take 1 tab daily    Metformin Hcl 500 Mg Tabs (Metformin hcl) .Marland Kitchen... Take 1 tab two times a day  Patient Instructions: 1)  Your physician recommends that you schedule a follow-up appointment in: 6 months 2)  Your physician has recommended you make the following change in your medication: Stop taking Simvastatin and start taking Atorvastin 10mg  by mouth at bedtime  3)  Your physician recommends that you return for lab work in: 6 weeks Prescriptions: TOPROL XL 200 MG XR24H-TAB (METOPROLOL SUCCINATE) take 1 tab daily  #30 x 6   Entered by:   Larita Fife Via LPN   Authorized by:   Gaylord Shih, MD, [facility]   Signed by:   Larita Fife Via LPN on  43/32/9518   Method used:   Electronically to        [facility]  [address] (retail)       [address]       [place], [place]  [address]       Ph: [phone]       Fax: [phone]   RxID:   [id number] LIPITOR 10 MG TABS (ATORVASTATIN CALCIUM) take 1 tablet by mouth at bedtime  #30 x 6   Entered by:   Larita Fife Via LPN   Authorized by:   Gaylord Shih, MD, [facility]   Signed by:   Larita Fife Via LPN on 17/61/6073   Method used:   Electronically to        [facility]  [address] (retail)       [address]       [place], [place]  [address]  Ph: [phone]       Fax: [phone]   RxID:   [id number]

## 2010-11-23 NOTE — Medication Information (Signed)
Summary: ccr-lr  Anticoagulant Therapy  Managed by: Vashti Hey, RN PCP: Dr Wyvonnia Dusky MD: Diona Browner MD, Remi Deter Indication 1: Atrial Fibrillation Lab Used: [facility] [place] Site: [place] INR POC 2.2  Dietary changes: no    Health status changes: no    Bleeding/hemorrhagic complications: no    Recent/future hospitalizations: no    Any changes in medication regimen? no    Recent/future dental: no  Any missed doses?: no       Is patient compliant with meds? yes       Allergies: 1)  ! Penicillin  Anticoagulation Management History:      The patient is taking warfarin and comes in today for a routine follow up visit.  Positive risk factors for bleeding include presence of serious comorbidities.  Negative risk factors for bleeding include an age less than 61 years old.  The bleeding index is 'intermediate risk'.  Positive CHADS2 values include History of HTN and History of Diabetes.  Negative CHADS2 values include Age > 50 years old.  The start date was 08/27/2008.  Anticoagulation responsible provider: Diona Browner MD, Remi Deter.  INR POC: 2.2.  Cuvette Lot#: [id number].  Exp: 10/11.    Anticoagulation Management Assessment/Plan:      The patient's current anticoagulation dose is Warfarin sodium 2.5 mg tabs: take as directed by coumadin clinic.  The target INR is 2.0-3.0.  The next INR is due 09/15/2010.  Anticoagulation instructions were given to patient.  Results were reviewed/authorized by Vashti Hey, RN.  She was notified by Vashti Hey RN.         Prior Anticoagulation Instructions: INR 2.0 Continue coumadin 5mg  once daily except 2.5mg  on Mondays and Fridays  Current Anticoagulation Instructions: INR 2.2 Continue coumadin 5mg  once daily except 2.5mg  on Mondays and Fridays

## 2010-11-23 NOTE — Medication Information (Signed)
Summary: ccr-lr  Anticoagulant Therapy  Managed by: Vashti Hey, RN PCP: Dr.Mark Judene Companion MD: Dietrich Pates MD, Molly Maduro Indication 1: Atrial Fibrillation Lab Used: [facility] [place] Site: [place] INR POC 2.4  Dietary changes: no    Health status changes: no    Bleeding/hemorrhagic complications: no    Recent/future hospitalizations: no    Any changes in medication regimen? no    Recent/future dental: no  Any missed doses?: no       Is patient compliant with meds? yes       Allergies: 1)  ! Penicillin  Anticoagulation Management History:      The patient is taking warfarin and comes in today for a routine follow up visit.  Positive risk factors for bleeding include presence of serious comorbidities.  Negative risk factors for bleeding include an age less than 61 years old.  The bleeding index is 'intermediate risk'.  Positive CHADS2 values include History of HTN and History of Diabetes.  Negative CHADS2 values include Age > 108 years old.  The start date was 08/27/2008.  Anticoagulation responsible provider: Dietrich Pates MD, Molly Maduro.  INR POC: 2.4.  Cuvette Lot#: [id number].  Exp: 10/11.    Anticoagulation Management Assessment/Plan:      The patient's current anticoagulation dose is Warfarin sodium 2.5 mg tabs: take as directed by coumadin clinic.  The target INR is 2.0-3.0.  The next INR is due 03/04/2010.  Anticoagulation instructions were given to patient.  Results were reviewed/authorized by Vashti Hey, RN.  She was notified by Vashti Hey RN.         Prior Anticoagulation Instructions: INR 2.6 Continue coumadin 5mg  once daily except 2.5mg  on Mondays and Fridays  Current Anticoagulation Instructions: INR 2.4 Continue coumadin 5mg  once daily except 2.5mg  on Mondays and Fridays

## 2010-11-23 NOTE — Letter (Signed)
Summary: [place] [facility] Work Engineer, agricultural at [employer]  [address] [place] [address] [place] [address]   Phone: [phone]  Fax: [phone]     June 14, 2010 MRN: [id number]   [facility] [address] [place], [place]  [address]      YOUR LAB WORK IS DUE  July 26, 2010 _________________________________________  Please go to [facility], located across the street from [facility] on the second floor.  Hours are Monday - Friday 7am until 7:30pm         Saturday 8am until 12noon    _X_  DO NOT EAT OR DRINK AFTER MIDNIGHT EVENING PRIOR TO LABWORK  __ YOUR LABWORK IS NOT FASTING --YOU MAY EAT PRIOR TO LABWORK

## 2010-11-23 NOTE — Progress Notes (Signed)
Summary: RX REFILL   Phone Note Call from Patient Call back at Home Phone [phone]   Caller: PT Reason for Call: Refill Medication Summary of Call: PT NEEDS WARFRIN 2.5MG  CALLED INTO [facility] IN [place] Initial call taken by: Faythe Ghee,  January 14, 2010 9:39 AM  Follow-up for Phone Call        Pt. advised that med is at pharmacy ready for pick up. Follow-up by: Larita Fife Via LPN,  January 14, 2010 11:01 AM

## 2010-11-23 NOTE — Medication Information (Signed)
Summary: ccr-lr  Anticoagulant Therapy  Managed by: Vashti Hey, RN PCP: Dr.Mark Nobie Putnam Supervising MD: Daleen Squibb MD, Maisie Fus Indication 1: Atrial Fibrillation Lab Used: [facility] [place] Site: [place] INR POC 2.6  Dietary changes: no    Health status changes: no    Bleeding/hemorrhagic complications: no    Recent/future hospitalizations: no    Any changes in medication regimen? no    Recent/future dental: no  Any missed doses?: no       Is patient compliant with meds? yes       Allergies: 1)  ! Penicillin  Anticoagulation Management History:      The patient is taking warfarin and comes in today for a routine follow up visit.  Positive risk factors for bleeding include presence of serious comorbidities.  Negative risk factors for bleeding include an age less than 15 years old.  The bleeding index is 'intermediate risk'.  Positive CHADS2 values include History of HTN and History of Diabetes.  Negative CHADS2 values include Age > 18 years old.  The start date was 08/27/2008.  Anticoagulation responsible provider: Daleen Squibb MD, Maisie Fus.  INR POC: 2.6.  Cuvette Lot#: [id number].  Exp: 10/11.    Anticoagulation Management Assessment/Plan:      The patient's current anticoagulation dose is Warfarin sodium 2.5 mg tabs: take as directed by coumadin clinic.  The target INR is 2.0-3.0.  The next INR is due 02/04/2010.  Anticoagulation instructions were given to patient.  Results were reviewed/authorized by Vashti Hey, RN.  She was notified by Vashti Hey RN.         Prior Anticoagulation Instructions: INR 2.2 Continue coumadin 5mg  once daily except 2.5mg  on Mondays and Fridays  Current Anticoagulation Instructions: INR 2.6 Continue coumadin 5mg  once daily except 2.5mg  on Mondays and Fridays

## 2010-11-23 NOTE — Letter (Signed)
Summary: [place] [facility] [employer], agricultural at [employer]  [address], [place] [address]   Phone: [phone]  Fax: [phone]     April 02, 2010 MRN: [id number]   [facility] [address] [place], [place]  [address]      YOUR LAB WORK IS DUE   April 15, 2010  Please go to [facility], located across the street from [facility] on the second floor.  Hours are Monday - Friday 7am until 7:30pm         Saturday 8am until 12noon    __  DO NOT EAT OR DRINK AFTER MIDNIGHT EVENING PRIOR TO LABWORK  _x_ YOUR LABWORK IS NOT FASTING --YOU MAY EAT PRIOR TO LABWORK  PLEASE DISCONTINUE YOUR TRIAM/HCTZ , START HYDROCHLORATHIAZIDE 25MG  DAILY, INCREASE POTASSIUM IN YOUR DIET.  LABWORK ON April 15, 2010

## 2010-11-23 NOTE — Medication Information (Signed)
Summary: ccr-lr  Anticoagulant Therapy  Managed by: Vashti Hey, RN PCP: Dr.Mark Judene Companion MD: Diona Browner MD, Remi Deter Indication 1: Atrial Fibrillation Lab Used: [facility] [place] Site: [place] INR POC 1.9  Dietary changes: no    Health status changes: no    Bleeding/hemorrhagic complications: no    Recent/future hospitalizations: no    Any changes in medication regimen? no    Recent/future dental: no  Any missed doses?: no       Is patient compliant with meds? yes       Allergies: 1)  ! Penicillin  Anticoagulation Management History:      The patient is taking warfarin and comes in today for a routine follow up visit.  Positive risk factors for bleeding include presence of serious comorbidities.  Negative risk factors for bleeding include an age less than 33 years old.  The bleeding index is 'intermediate risk'.  Positive CHADS2 values include History of HTN and History of Diabetes.  Negative CHADS2 values include Age > 75 years old.  The start date was 08/27/2008.  Anticoagulation responsible provider: Diona Browner MD, Remi Deter.  INR POC: 1.9.  Cuvette Lot#: [id number].  Exp: 10/11.    Anticoagulation Management Assessment/Plan:      The patient's current anticoagulation dose is Warfarin sodium 2.5 mg tabs: take as directed by coumadin clinic.  The target INR is 2.0-3.0.  The next INR is due 04/28/2010.  Anticoagulation instructions were given to patient.  Results were reviewed/authorized by Vashti Hey, RN.  She was notified by Vashti Hey RN.         Prior Anticoagulation Instructions: INR 2.5 Continue coumadin 5mg  once daily except 2.5mg  on Mondays and Fridays  Current Anticoagulation Instructions: INR 1.9 Take coumadin 3 tablets tonight then resume 5mg  once daily except 2.5mg  on Mondays and Fridays

## 2010-11-23 NOTE — Progress Notes (Signed)
Summary: Medication Question  Medications Added TRIAMTERENE-HCTZ 37.5-25 MG TABS (TRIAMTERENE-HCTZ) Take 1 tablet by mouth once a day       Phone Note Call from Patient   Caller: Patient Reason for Call: Talk to Nurse Summary of Call: pt states that she received letter from [facility] about stopping a med / pt seemed to be extremely confused about the "med" she was to stop taking / please return call to pt/tg Initial call taken by: Raechel Ache [facility],  April 07, 2010 2:52 PM  Follow-up for Phone Call        [facility] and pt prefers to stay on maxzide since it controls her bp and fluid better than the plain hctz Follow-up by: Teressa Lower RN,  April 07, 2010 3:01 PM    New/Updated Medications: TRIAMTERENE-HCTZ 37.5-25 MG TABS (TRIAMTERENE-HCTZ) Take 1 tablet by mouth once a day Prescriptions: TRIAMTERENE-HCTZ 37.5-25 MG TABS (TRIAMTERENE-HCTZ) Take 1 tablet by mouth once a day  #30 x 3   Entered by:   Teressa Lower RN   Authorized by:   Gaylord Shih, MD, [facility]   Signed by:   Teressa Lower RN on 04/07/2010   Method used:   Electronically to        [facility]  [address] (retail)       [address]       [place], [place]  [address]       Ph: [phone]       Fax: [phone]   RxID:   [id number]

## 2010-11-23 NOTE — Medication Information (Signed)
Summary: ccr-lr  Anticoagulant Therapy  Managed by: Vashti Hey, RN PCP: Dr Wyvonnia Dusky MD: Dietrich Pates MD, Molly Maduro Indication 1: Atrial Fibrillation Lab Used: [facility] [place] Site: [address] INR POC 2.5  Dietary changes: no    Health status changes: no    Bleeding/hemorrhagic complications: no    Recent/future hospitalizations: no    Any changes in medication regimen? no    Recent/future dental: no  Any missed doses?: no       Is patient compliant with meds? yes       Allergies: 1)  ! Penicillin  Anticoagulation Management History:      The patient is taking warfarin and comes in today for a routine follow up visit.  Positive risk factors for bleeding include presence of serious comorbidities.  Negative risk factors for bleeding include an age less than 86 years old.  The bleeding index is 'intermediate risk'.  Positive CHADS2 values include History of HTN and History of Diabetes.  Negative CHADS2 values include Age > 75 years old.  The start date was 08/27/2008.  Anticoagulation responsible Nakeem Murnane: Dietrich Pates MD, Molly Maduro.  INR POC: 2.5.  Cuvette Lot#: [id number].  Exp: 10/11.    Anticoagulation Management Assessment/Plan:      The patient's current anticoagulation dose is Warfarin sodium 2.5 mg tabs: take as directed by coumadin clinic.  The target INR is 2.0-3.0.  The next INR is due 07/21/2010.  Anticoagulation instructions were given to patient.  Results were reviewed/authorized by Vashti Hey, RN.  She was notified by Vashti Hey RN.         Prior Anticoagulation Instructions: INR 1.8 Take coumadin 3 tablets tonight and tomorrow night then resume 2 tablets once daily except 1 tablet on Mondays and Fridays  Current Anticoagulation Instructions: INR 2.5 Continue coumadin 5mg  once daily except 2.5mg  on Mondays and Fridays

## 2010-11-25 NOTE — Medication Information (Signed)
Summary: ccr-lr  Anticoagulant Therapy  Managed by: Vashti Hey, RN PCP: Dr Wyvonnia Dusky MD: Diona Browner MD, Remi Deter Indication 1: Atrial Fibrillation Lab Used: [facility] [place] Site: [place] INR POC 1.9  Dietary changes: no    Health status changes: no    Bleeding/hemorrhagic complications: no    Recent/future hospitalizations: no    Any changes in medication regimen? no    Recent/future dental: no  Any missed doses?: no       Is patient compliant with meds? yes       Allergies: 1)  ! Penicillin  Anticoagulation Management History:      The patient is taking warfarin and comes in today for a routine follow up visit.  Positive risk factors for bleeding include presence of serious comorbidities.  Negative risk factors for bleeding include an age less than 61 years old.  The bleeding index is 'intermediate risk'.  Positive CHADS2 values include History of HTN and History of Diabetes.  Negative CHADS2 values include Age > 12 years old.  The start date was 08/27/2008.  Anticoagulation responsible provider: Diona Browner MD, Remi Deter.  INR POC: 1.9.  Cuvette Lot#: [id number].  Exp: 10/11.    Anticoagulation Management Assessment/Plan:      The patient's current anticoagulation dose is Warfarin sodium 2.5 mg tabs: take as directed by coumadin clinic.  The target INR is 2.0-3.0.  The next INR is due 12/08/2010.  Anticoagulation instructions were given to patient.  Results were reviewed/authorized by Vashti Hey, RN.  She was notified by Vashti Hey RN.         Prior Anticoagulation Instructions: INR 2.4 Continue coumadin 5mg  once daily except 2.5mg  on Mondays and Fridays  Current Anticoagulation Instructions: INR 1.9 Take coumadin 3 tablets tonight then resume 2 tablets once daily except 1 tablet on Mondays and Fridays

## 2010-11-25 NOTE — Medication Information (Signed)
Summary: ccr-lr  Anticoagulant Therapy  Managed by: Vashti Hey, RN PCP: Dr Wyvonnia Dusky MD: Dietrich Pates MD, Molly Maduro Indication 1: Atrial Fibrillation Lab Used: [facility] [place] Site: [place] INR POC 2.4  Dietary changes: no    Health status changes: no    Bleeding/hemorrhagic complications: no    Recent/future hospitalizations: no    Any changes in medication regimen? no    Recent/future dental: no  Any missed doses?: no       Is patient compliant with meds? yes       Allergies: 1)  ! Penicillin  Anticoagulation Management History:      The patient is taking warfarin and comes in today for a routine follow up visit.  Positive risk factors for bleeding include presence of serious comorbidities.  Negative risk factors for bleeding include an age less than 18 years old.  The bleeding index is 'intermediate risk'.  Positive CHADS2 values include History of HTN and History of Diabetes.  Negative CHADS2 values include Age > 18 years old.  The start date was 08/27/2008.  Anticoagulation responsible provider: Dietrich Pates MD, Molly Maduro.  INR POC: 2.4.  Cuvette Lot#: [id number].  Exp: 10/11.    Anticoagulation Management Assessment/Plan:      The patient's current anticoagulation dose is Warfarin sodium 2.5 mg tabs: take as directed by coumadin clinic.  The target INR is 2.0-3.0.  The next INR is due 11/10/2010.  Anticoagulation instructions were given to patient.  Results were reviewed/authorized by Vashti Hey, RN.  She was notified by Vashti Hey RN.         Prior Anticoagulation Instructions: INR 2.2 Continue coumadin 5mg  once daily except 2.5mg  on Mondays and Fridays  Current Anticoagulation Instructions: INR 2.4 Continue coumadin 5mg  once daily except 2.5mg  on Mondays and Fridays

## 2010-12-08 ENCOUNTER — Encounter (INDEPENDENT_AMBULATORY_CARE_PROVIDER_SITE_OTHER): Payer: BC Managed Care – PPO

## 2010-12-08 ENCOUNTER — Encounter: Payer: Self-pay | Admitting: Cardiology

## 2010-12-08 DIAGNOSIS — I4891 Unspecified atrial fibrillation: Secondary | ICD-10-CM

## 2010-12-08 DIAGNOSIS — Z7901 Long term (current) use of anticoagulants: Secondary | ICD-10-CM

## 2010-12-08 LAB — CONVERTED CEMR LAB: POC INR: 2.2

## 2010-12-15 NOTE — Medication Information (Signed)
Summary: CCR-LR/TR  Anticoagulant Therapy  Managed by: Vashti Hey, RN PCP: Dr Wyvonnia Dusky MD: Diona Browner MD, Remi Deter Indication 1: Atrial Fibrillation Lab Used: [facility] [place] Site: [place] INR POC 2.2  Dietary changes: no    Health status changes: no    Bleeding/hemorrhagic complications: no    Recent/future hospitalizations: no    Any changes in medication regimen? no    Recent/future dental: no  Any missed doses?: no       Is patient compliant with meds? yes       Allergies: 1)  ! Penicillin  Anticoagulation Management History:      The patient is taking warfarin and comes in today for a routine follow up visit.  Positive risk factors for bleeding include presence of serious comorbidities.  Negative risk factors for bleeding include an age less than 6 years old.  The bleeding index is 'intermediate risk'.  Positive CHADS2 values include History of HTN and History of Diabetes.  Negative CHADS2 values include Age > 3 years old.  The start date was 08/27/2008.  Anticoagulation responsible provider: Diona Browner MD, Remi Deter.  INR POC: 2.2.  Cuvette Lot#: [id number].  Exp: 10/11.    Anticoagulation Management Assessment/Plan:      The patient's current anticoagulation dose is Warfarin sodium 2.5 mg tabs: take as directed by coumadin clinic.  The target INR is 2.0-3.0.  The next INR is due 01/05/2011.  Anticoagulation instructions were given to patient.  Results were reviewed/authorized by Vashti Hey, RN.  She was notified by Vashti Hey RN.         Prior Anticoagulation Instructions: INR 1.9 Take coumadin 3 tablets tonight then resume 2 tablets once daily except 1 tablet on Mondays and Fridays  Current Anticoagulation Instructions: INR 2.2 Continue coumadin 5mg  once daily except 2.5mg  on Mondays and Fridays

## 2011-01-05 ENCOUNTER — Encounter: Payer: Self-pay | Admitting: Cardiology

## 2011-01-05 ENCOUNTER — Encounter (INDEPENDENT_AMBULATORY_CARE_PROVIDER_SITE_OTHER): Payer: BC Managed Care – PPO

## 2011-01-05 DIAGNOSIS — I4891 Unspecified atrial fibrillation: Secondary | ICD-10-CM

## 2011-01-05 DIAGNOSIS — Z7901 Long term (current) use of anticoagulants: Secondary | ICD-10-CM

## 2011-01-05 LAB — CONVERTED CEMR LAB: POC INR: 2.3

## 2011-01-11 NOTE — Medication Information (Signed)
Summary: ccr-lr  Anticoagulant Therapy  Managed by: Vashti Hey, RN PCP: Dr Wyvonnia Dusky MD: Daleen Squibb MD, Maisie Fus Indication 1: Atrial Fibrillation Lab Used: [facility] [place] Site: [place] INR POC 2.3  Dietary changes: no    Health status changes: no    Bleeding/hemorrhagic complications: no    Recent/future hospitalizations: no    Any changes in medication regimen? no    Recent/future dental: no  Any missed doses?: no       Is patient compliant with meds? yes       Allergies: 1)  ! Penicillin  Anticoagulation Management History:      The patient is taking warfarin and comes in today for a routine follow up visit.  Positive risk factors for bleeding include presence of serious comorbidities.  Negative risk factors for bleeding include an age less than 77 years old.  The bleeding index is 'intermediate risk'.  Positive CHADS2 values include History of HTN and History of Diabetes.  Negative CHADS2 values include Age > 68 years old.  The start date was 08/27/2008.  Anticoagulation responsible provider: Daleen Squibb MD, Maisie Fus.  INR POC: 2.3.  Cuvette Lot#: [id number].  Exp: 10/11.    Anticoagulation Management Assessment/Plan:      The patient's current anticoagulation dose is Warfarin sodium 2.5 mg tabs: take as directed by coumadin clinic.  The target INR is 2.0-3.0.  The next INR is due 02/02/2011.  Anticoagulation instructions were given to patient.  Results were reviewed/authorized by Vashti Hey, RN.  She was notified by Vashti Hey RN.         Prior Anticoagulation Instructions: INR 2.2 Continue coumadin 5mg  once daily except 2.5mg  on Mondays and Fridays  Current Anticoagulation Instructions: INR 2.3 Continue coumadin 5mg  once daily except 2.5mg  on Mondays and Fridays

## 2011-02-01 ENCOUNTER — Encounter: Payer: Self-pay | Admitting: Cardiology

## 2011-02-01 DIAGNOSIS — Z7901 Long term (current) use of anticoagulants: Secondary | ICD-10-CM | POA: Insufficient documentation

## 2011-02-01 DIAGNOSIS — I4891 Unspecified atrial fibrillation: Secondary | ICD-10-CM

## 2011-02-02 ENCOUNTER — Ambulatory Visit (INDEPENDENT_AMBULATORY_CARE_PROVIDER_SITE_OTHER): Payer: BC Managed Care – PPO | Admitting: *Deleted

## 2011-02-02 DIAGNOSIS — Z7901 Long term (current) use of anticoagulants: Secondary | ICD-10-CM

## 2011-02-02 DIAGNOSIS — I4891 Unspecified atrial fibrillation: Secondary | ICD-10-CM

## 2011-02-02 LAB — POCT INR: INR: 2.6

## 2011-02-07 ENCOUNTER — Other Ambulatory Visit: Payer: Self-pay | Admitting: Cardiology

## 2011-02-08 LAB — GLUCOSE, CAPILLARY: Glucose-Capillary: 213 mg/dL — ABNORMAL HIGH (ref 70–99)

## 2011-02-08 LAB — PROTIME-INR
INR: 2.2 — ABNORMAL HIGH (ref 0.00–1.49)
Prothrombin Time: 25.6 seconds — ABNORMAL HIGH (ref 11.6–15.2)

## 2011-02-10 ENCOUNTER — Telehealth: Payer: Self-pay | Admitting: Cardiology

## 2011-02-10 MED ORDER — WARFARIN SODIUM 2.5 MG PO TABS: 2.5000 mg | ORAL_TABLET | ORAL | Status: DC

## 2011-02-10 NOTE — Telephone Encounter (Signed)
Patient needs refill on Warfarin called to [facility] in [place] / tg

## 2011-03-02 ENCOUNTER — Ambulatory Visit (INDEPENDENT_AMBULATORY_CARE_PROVIDER_SITE_OTHER): Payer: BC Managed Care – PPO | Admitting: *Deleted

## 2011-03-02 DIAGNOSIS — I4891 Unspecified atrial fibrillation: Secondary | ICD-10-CM

## 2011-03-02 DIAGNOSIS — Z7901 Long term (current) use of anticoagulants: Secondary | ICD-10-CM

## 2011-03-02 LAB — POCT INR: INR: 2.5

## 2011-03-08 NOTE — Letter (Signed)
December 08, 2008    Amber Duel, MD  [address], [address]  [place], [place] [address]   RE:  Amber Armstrong, Amber Armstrong  MRN:  [id number]  /  DOB:  09-Jul-1951   Dear Amber Armstrong,   Amber Armstrong returns to the office for continued assessment and treatment  of atrial fibrillation and hypertension.  Since her last visit, she has  done well from a symptomatic standpoint.  She is working full-time at  [employer] without any difficulty.  She has noted some mild pedal edema  that is not terribly troublesome to her.   Her medications are unchanged from her last visit.   PHYSICAL EXAMINATION:  GENERAL:  Overweight, pleasant woman in no acute  distress.  VITAL SIGNS:  The weight is 190, 16 pounds more than last month.  Blood  pressure 170/95, heart rate 66 and irregular, respirations 14 and  unlabored.  NECK:  Mild jugular venous distention only noted on the right side of  the neck; normal carotid upstrokes without bruits.  LUNGS:  Clear.  CARDIAC:  Irregular rhythm; normal first and second heart sounds.  Modest systolic ejection murmur.  ABDOMEN:  Soft and nontender; no organomegaly.  EXTREMITIES:  1-1/2+ pretibial edema.   INR has been therapeutic since mid January.   IMPRESSION:  Amber Armstrong is doing well from a symptomatic standpoint.  We  will proceed with a cardioversion attempt.  She will return to the  office in 1 month to determine if this was successful and if she can  maintain sinus rhythm without antiarrhythmic agents.   Blood pressure is high on this visit, but has never been elevated in  multiple previous assessments.  She tells me that she ate a large  quantity of sausage recently and also has obviously gained a significant  amount of weight.  She will try to reverse both of these indiscretions  over the coming weeks.    Sincerely,      Gerrit Friends. Dietrich Pates, MD, [facility]  Electronically Signed    RMR/MedQ  DD: 12/08/2008  DT: 12/09/2008  Job #: [id number]

## 2011-03-08 NOTE — Letter (Signed)
September 04, 2008    Patrica Duel, MD  [address], [address]  [place], [place] [address]   RE:  Amber Armstrong, Amber Armstrong  MRN:  [id number]  /  DOB:  July 26, 1951   Dear [name],   Amber Armstrong returns to the office for continued assessment and treatment  of atrial fibrillation of unknown duration and a recent urinary tract  infection.  She continues to do well from symptomatic standpoint.  She  does note when she walks entirely across the floor at the [employer] where  she works, she experiences fatigue in her legs.  She has had no dyspnea.  She notes no palpitations.  She has no urinary symptoms.   Current medications include metoprolol ER 200 mg daily, warfarin as  directed with near therapeutic anticoagulation, levothyroxine 0.175 mg  daily, glyburide/metformin 2.5/500 mg b.i.d., diltiazem 180 mg daily.   On exam, pleasant woman in no acute distress.  The weight is 170,  stable.  Blood pressure 100/70, heart rate 100 and irregular,  respirations 16.  Neck, no jugular venous distention; no carotid bruits.  Lungs, decreased breath sounds in the bases; otherwise clear.  Cardiac,  irregular rhythm; normal first and second heart sounds; minimal systolic  murmur.  Abdomen, soft and nontender; no organomegaly.  Extremities, no  edema.   RHYTHM STRIP:  Atrial fibrillation with a ventricular rate of 96.   Echocardiogram is pending.   IMPRESSION:  Amber Armstrong continues to tolerate atrial fibrillation quite  well.  I am inclined to leave her in this arrhythmia, but generally  afford the patient's at least 1 cardioversion before accepting  chronic atrial fibrillation.  She has not been adequately anticoagulated  to allow this to proceed.  Rate control continues to be suboptimal.  We  will increase diltiazem to a total of 360 mg daily and reduce her dose  of metoprolol to 100 mg daily.  Further dosage adjustments will be made  in 1 week by the cardiology nurses.  I will see this nice woman again in  2  weeks.    Sincerely,      Gerrit Friends. Dietrich Pates, MD, [facility]  Electronically Signed    RMR/MedQ  DD: 09/04/2008  DT: 09/05/2008  Job #: [id number]

## 2011-03-08 NOTE — Assessment & Plan Note (Signed)
[facility]                       [place] CARDIOLOGY OFFICE NOTE   NAME:Armstrong, Amber CILENTO                       MRN:          [id number]  DATE:05/07/2009                            DOB:          [birth year]    Amber Armstrong comes in today for followup of the following issues.  1. Paroxysmal atrial fibrillation.  2. Hypertension.  3. Mild left ventricular hypertrophy.  4. Anticoagulation.   She comes in today with no significant complaints.  Specifically, denies  any syncope, palpitations, orthopnea, PND, or peripheral edema.  She  seems very compliant to our Coumadin Clinic and also brings her meds for  [place] to review today.   Her biggest complaint is that of having two discolorations of her big  toe nails.  On inspection, she has some small ecchymotic areas  underneath the nail bed.  There is no sign of infection.  Looks like  this is a shoe problem rather than trauma per se.   MEDICATIONS:  Today include,  1. Metoprolol extended release 100 mg twice a day.  2. Warfarin 2.5 mg as directed.  3. Levothyroxine 175 mcg a day.  4. Glyburide.  5. Metformin 2.5/500 b.i.d.  6. Diltiazem 180 mg p.o. b.i.d., which is extended release form.  7. Triamterene and hydrochlorothiazide 37.5/25 mg half a tablet per      day.   PHYSICAL EXAMINATION:  GENERAL:  She is very pleasant.  Alert and  oriented.  VITAL SIGNS:  Blood pressure is 161/76 in the left arm, her pulse is 66  and regular, her weight is up 8 pounds to 189.  HEENT:  Other than her teeth being in somewhat poor repair unremarkable.  NECK:  Supple.  Carotid upstrokes were equal bilaterally without bruits.  No JVD.  Thyroid is not enlarged.  Trachea is midline.  CHEST:  Lungs are clear to auscultation and percussion.  HEART:  Regular rate and rhythm.  No murmur, rub, or gallop.  ABDOMEN:  Soft, good bowel sounds.  No obvious organomegaly.  EXTREMITIES:  No edema.  She has small lunar areas of  discoloration  consistent with blood in her nails on big toes.  No sign of infection.  Pulses were 2+/4+ bilaterally symmetrical.  NEUROLOGIC:  Intact.  SKIN:  Otherwise unremarkable for a slight bruise on her right forearm.   INR today is 2.5.   ASSESSMENT:  1. History of paroxysmal atrial fibrillation, staying in sinus rhythm.  2. Hypertension.  She says it is under good control at home, but I am      concerned about other stress related circumstances besides Suppes      coat syndrome.  3. Anticoagulation therapeutic.  4. 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   PLAN:  1. Change diltiazem extended release to 360 mg q.a.m.  2. Continue other medication.  3. Advised to wear diabetic or orthopedic-type shoes that are      comfortable.  She actually just purchased some from a drug store in      [place].   We will see her back in 6 months.  Thomas C. Daleen Squibb, MD, [facility]  Electronically Signed    TCW/MedQ  DD: 05/07/2009  DT: 05/08/2009  Job #: [id number]   cc:   Madelin Rear. Sherwood Gambler, MD  Patrica Duel, M.D.

## 2011-03-08 NOTE — Letter (Signed)
October 29, 2008    Amber Duel, MD  [address], [address]  [place], [place] [address]   RE:  Amber Armstrong, Amber Armstrong  MRN:  [id number]  /  DOB:  [birth year]   Dear Amber Armstrong,   Amber Armstrong returns to the office for continued assessment and treatment  of atrial fibrillation.  Since her last visit, she has remained  asymptomatic.  She is active at work without difficulty.  She has noted  some mild pedal edema that has not been problematic for her.  She  experiences no palpitations.   Medications are unchanged from her last visit.  Unfortunately, she was  given improper directions regarding her warfarin dosage.  The dose was  accidentally had.  Accordingly, her INR is normal today.   PHYSICAL EXAMINATION:  GENERAL:  Pleasant, overweight woman in no acute  distress.  VITAL SIGNS:  The weight is 174, one pound more than at her last visit,  blood pressure 135/85, heart rate 80 and irregular, respirations 12.  NECK:  No jugular venous distention.  LUNGS:  Clear.  CARDIAC:  Irregular rhythm; normal first and second heart sound; minimal  systolic murmur.  ABDOMEN:  Soft and nontender; no organomegaly.  EXTREMITIES:  1+ pitting pretibial edema.   Rhythm strip:  Atrial fibrillation with a controlled ventricular  response.   IMPRESSION:  Amber Armstrong is doing well overall.  Unfortunately, we cannot  proceed with cardioversion now and anticoagulation has been  inadvertently interrupted.  She will resume her  usual dose of 5 mg 5 days per week and 2.5 mg 2 days per week.  We will  follow her INRs and reassess this nice woman in 6 weeks at which time I  hope we can schedule cardioversion.   Her pedal edema partially reflects some excess salt in her diet.  Diltiazem may be contributing.  At this point, she does not require  diuretics.  We will emphasize dietary management.    Sincerely,      Amber Friends. Dietrich Pates, MD, [facility]  Electronically Signed    RMR/MedQ  DD: 10/29/2008  DT: 10/30/2008  Job  #: [id number]

## 2011-03-08 NOTE — Letter (Signed)
September 19, 2008    Amber Duel, MD  [address]  [place], [place] [address]   RE:  Amber, Armstrong  MRN:  [id number]  /  DOB:  [birth year]   Dear Amber Armstrong,   Amber Armstrong returns to the office for continued management of atrial  fibrillation.  Since her last visit, she has done well.  She notes an  improved sense of well-being and an increased ability to walk without  fatigue or dyspnea.  She feels that she is back to normal with respect  to her exercise tolerance.  Her symptoms of nephrolithiasis and urinary  tract infection are resolved.  She feels fine at the present time.   CURRENT MEDICATIONS:  1. Metoprolol 100 mg b.i.d.  2. Warfarin as directed with therapeutic anticoagulation.  3. Levothyroxine 0.175 mg daily.  4. Glyburide.  5. Metformin 2.5/500 mg daily.  6. Diltiazem 180 mg b.i.d.   PHYSICAL EXAMINATION:  GENERAL:  Pleasant woman in no acute distress.  The weight is 173, 3 pounds more than 2 weeks ago.  Blood pressure  110/75, heart rate 70 and irregular, and respirations 14.  NECK:  Mild jugular venous distention.  LUNGS:  Clear.  CARDIAC:  Normal first and second heart sounds.  EXTREMITIES:  No edema.   IMPRESSION:  We have finally achieved adequate control of heart rate  with Amber Armstrong.  This appears to have resulted in resolution of any  symptoms she had attributable to atrial fibrillation.  Nonetheless,  since we have taken the trouble to anticoagulate her, I would be  inclined to  attempt 1 cardioversion.  She will need 1 month of therapeutic INRs  before we do that.  I will reevaluate her in 6 months to see if she is  ready at that time.  She is a borderline candidate for a long-term  anticoagulation.  She will probably need a transesophageal  echocardiogram to assist with that decision.  She will arrange to follow  up with Dr. Rito Ehrlich for nephrolithiasis.    Sincerely,      Gerrit Friends. Dietrich Pates, MD, [facility]  Electronically Signed    RMR/MedQ  DD: 09/19/2008  DT: 09/19/2008  Job #: [id number]

## 2011-03-08 NOTE — Assessment & Plan Note (Signed)
[facility]                       [facility] OFFICE NOTE   NAME:Carrara, KYARA BOXER                       MRN:          [id number]  DATE:02/18/2009                            DOB:          09-20-1951    Ms. Bourdon comes in today for history of paroxysmal atrial fibrillation.  She says she can now walk 1 mile which she could not walk hardly at all  back couple of months ago.   She is having her Coumadin checked across the street.  Her INR last was  2.0 on 2.5 mg of warfarin a day.   She is complaining of some lower extremity edema.  She used to be on  Maxzide but is no longer taking it.   Her current meds are:  1. Metoprolol extended release 200 mg a half a tablet twice a day.  2. Warfarin.  3. Levothyroxine 175 mcg a day.  4. Glyburide.  5. Metformin 2.5/500 one b.i.d.  6. Diltiazem 180 mg p.o. b.i.d.   PHYSICAL EXAMINATION:  VITAL SIGNS:  Today, her blood pressure is  150/80, her pulse is 76 and regular, weight is 181 up 7 pounds.  HEENT:  Normal except for bad dentition.  NECK:  Supple.  There is no JVD.  Carotid upstrokes are equal  bilaterally without bruits.  Thyroid is not enlarged.  Trachea is  midline.  LUNGS:  Clear to auscultation and percussion.  HEART:  A regular rate and rhythm.  She is clearly in sinus rhythm.  ABDOMEN:  Obese with good bowel sounds.  No midline bruit.  EXTREMITIES:  No 2+ pretibial edema.  Pulses were present but reduced.  No sign of DVT.  NEUROLOGIC:  Grossly intact.   ASSESSMENT AND PLAN:  Ms. Mcafee remains in sinus rhythm.  She was in  sinus rhythm in March when she was last seen.  She has gained weight,  and she has significant lower extremity edema.  She has no symptoms of  heart failure.  She has a history of normal left ventricular function.   PLAN:  1. Begin Maxzide per her request q.a.m.  2. Check BMET in the week.   We will see her back in several months.   Hopefully, the Maxzide will  help her blood pressure as well.     Thomas C. Daleen Squibb, MD, [facility]  Electronically Signed    TCW/MedQ  DD: 02/18/2009  DT: 02/19/2009  Job #: [id number]   cc:   Patrica Duel, M.D.

## 2011-03-08 NOTE — Letter (Signed)
August 29, 2008    Patrica Duel, MD  [address]  [place], [place] [address].   RE:  Amber Armstrong, Amber Armstrong  MRN:  [id number]  /  DOB:  1951-07-25   Dear [name]:   Ms. Paar returns to the office for continued assessment and treatment  of atrial fibrillation.  Since last visit, she continues to be  asymptomatic from cardiac standpoint.  She notes no lightheadedness, no  palpitations, no chest pain, and no dyspnea.  Her laboratory studies  revealed pyuria with minimal hematuria and bacteria.  She has been  started on Bactrim DS 1 tablet b.i.d. for 3 days with resolution of her  prior symptoms of abdominal pain, flank pain, nausea, and emesis.  INR  was nearly therapeutic when rechecked at 2 days ago.   MEDICATIONS:  Unchanged from her last visit except for discontinuation  of doxazosin and an increase of metoprolol to 50 mg t.i.d.   PHYSICAL EXAMINATION:  GENERAL:  Pleasant, well-appearing woman.  VITAL SIGNS:  The weight is 170, unchanged.  Blood pressure 100/75,  heart rate 120 and irregular.  NECK:  No jugular venous distention.  LUNGS:  Clear.  CARDIAC:  Normal first and second heart sound; modest early systolic  ejection murmur.  ABDOMEN:  Soft and nontender; normal bowel sounds; no masses.  EXTREMITIES:  No edema.   RHYTHM STRIP:  Atrial fibrillation with rapid ventricular response.   INR - 1.9 on August 27, 2008.   IMPRESSION:  [name] is doing well with antibiotic therapy for  presumed urinary tract infection.  I had ordered a urine culture, but  one was never obtained.  We will complete an empiric course of treatment  of 3 days and reassess UA and culture in a week.   Her rate in atrial fibrillation continues to be uncontrolled despite a  marked increase in metoprolol.  We will further increase the dose to 200  mg per day and add diltiazem 180 mg daily.  Because her blood pressure  is sagging, lisinopril will be discontinued.  I will reassess this nice  woman again in 1 week.  An INR is pending from today.    Sincerely,      Gerrit Friends. Dietrich Pates, MD, [facility]  Electronically Signed    RMR/MedQ  DD: 08/29/2008  DT: 08/30/2008  Job #: [id number]

## 2011-03-08 NOTE — Letter (Signed)
August 27, 2008    Amber Duel, MD  [address]  [place], [place] [address]   RE:  Amber Armstrong, Amber Armstrong  MRN:  [id number]  /  DOB:  05-14-1951   Dear Amber Armstrong,   It was my pleasure evaluating Amber Armstrong in consultation in the office  today at your request.  As you know, this nice woman was recently seen  by you for nausea and emesis.  She has had some abdominal discomfort,  but pain has not been prominent.  She also describes some flank pain.  She has had some chills, but no frank rigors and no definite fevers.  She has been given Phenergan, but has not been using it.  Nausea has  continued with four episodes of emesis today alone.  There has been no  hematemesis.  She had some diarrhea to begin with, but this has  resolved.  Her appetite and p.o. intake are fairly good in between  episodes.   On presentation to your office, a pulse irregularity was noted.  She was  found to have atrial fibrillation and was referred for further  evaluation.  She denies palpitations.  She has had no prior history of  cardiac disease.  She has never been evaluated by cardiologist nor  undergone any significant cardiac testing.   PAST MEDICAL HISTORY:  Essentially benign.  She has only been  hospitalized on one occasion, for elective partial thyroidectomy at age  48.  She has diabetes controlled with oral medication.  She has had  hypertension that has been well controlled with medical therapy.   ALLERGIES:  PENICILLIN is reported.   CURRENT MEDICATIONS:  1. Metoprolol ER 50 mg daily.  2. Warfarin 2.5 mg daily.  3. Levothyroxine 0.175 mg daily.  4. Glyburide/metformin 2.5/500 mg b.i.d.  5. Lisinopril 5 mg daily.  6. Doxazosin 2 mg daily.   SOCIAL HISTORY:  Liver with her mother, who is also a patient of mine.  Widowed with no children.  Currently employed at [employer].   FAMILY HISTORY:  Father died with Alzheimer; mother has atrial  fibrillation.  One sister is alive and well.   REVIEW OF SYSTEMS:  Notable for the need for corrective lenses, some  hearing impairment, stable weight following a diabetic diet.  All other  systems reviewed and are negative.   PHYSICAL EXAMINATION:  GENERAL:  Pleasant overweight woman in no acute  distress.  VITAL SIGNS:  The weight is 171, blood pressure 110/70, heart rate 125  and irregular, temperature 97.5.  HEENT:  Anicteric sclerae; normal lids and conjunctivae; normal oral  mucosa.  NECK:  No jugular venous distention; normal carotid upstrokes without  bruits.  ENDOCRINE:  1+ thyroid enlargement bilaterally.  SKIN:  No significant lesions.  HEMATOPOIETIC:  No adenopathy.  LUNGS:  Clear.  CARDIAC:  Normal first and second heart sounds; minimal systolic murmur.  ABDOMEN:  Mild tenderness in the upper abdomen and epigastrium, normal  bowel sounds; no masses; no organomegaly.  EXTREMITIES:  Normal distal  pulses; no edema.   EKG:  Atrial fibrillation/flutter with a rapid ventricular response;  rightward axis; right ventricular conduction delay - consider RVH; low-  voltage in the V leads; delayed R-wave progression; nonspecific ST-T  wave abnormality.   LABORATORY DATA:  Normal CBC, normal thyroid function studies, elevated  glucose of 286, mildly elevated BUN of 31, and mildly elevated  creatinine of 1.3.  Electrolytes are normal.   IMPRESSION:  Amber Armstrong has atrial fibrillation/flutter of  uncertain  duration.  She remains tachycardic despite appropriate initial therapy.  We will increase the metoprolol to a total of 150 mg per day.  An  echocardiogram will be obtained to evaluate for structural heart  disease.  Assuming a normal study, her arrhythmia will be attributed to  hypertension.   She has relatively low-risk thromboembolism.  We will continue  anticoagulation for now, but she may not require this in the long term.   Her abdominal symptoms persist.  I will obtain an amylase and lipase  level, an urine  analysis, stool for hemoccult testing, abdominal plain  film and an abdominal ultrasound.  I have asked her  to return to you for further assessment of those symptoms.  I will see  her again within the next 1-2 weeks.   Thanks so much for sending her to me for assessment.    Sincerely,      Gerrit Friends. Dietrich Pates, MD, [facility]  Electronically Signed    RMR/MedQ  DD: 08/27/2008  DT: 08/28/2008  Job #: [id number]

## 2011-03-11 NOTE — H&P (Signed)
[facility]  Patient:    Amber Armstrong, Amber Armstrong Visit Number: [id number] MRN: [id number]          Service Type: OUT Location: RAD Attending Physician:  Dennie Maizes Dictated by:   Dennie Maizes, M.D. Admit Date:  08/24/2001 Discharge Date: 08/24/2001   CC:         Elfredia Nevins, M.D.   History and Physical  PREOPERATIVE HISTORY AND PHYSICAL  CHIEF COMPLAINT:  Severe right flank pain, right upper ureteral calculus with obstruction, status post right ureteral stent placement.  HISTORY OF PRESENT ILLNESS:  This 60 year old female has a history of recurrent urolithiasis.  She has undergone ESL of left renal calculus in Mar 07, 1993.  She has passed several stones after the ESL.  She was referred to me by Dr. Elfredia Nevins for evaluation and management of right renal colic.  The patient had severe right flank pain radiating into the front for several days.  This was associated with nausea.  She did not have any fever, just the voiding difficulty.  There was no history of gross hematuria.  A CT scan of the abdomen and pelvis with contrast was done at [facility].  This revealed a moderate sized hiatal hernia, a small right renal calculus without obstruction and an obstructing 8 x 5 mm size right upper ureteral calculus, moderate hydronephrosis, dilated ureter, and perinephric soft tissue stranding were noted.  The patient underwent cystoscopy, right retrograde pyelogram, and right ureteral stent placement.  She has failed to undergo ESL about a month ago.  A stone could not be seen on the plain x-ray.  The procedure was cancelled.  A noncontrast CT was repeated and the stone was located in the right renal pelvis near the upper end of the stent.  The patient is brought back to the day hospital today for ESL of the right renal calculus.  PAST MEDICAL HISTORY:  History of hypertension, hypothyroidism, and status post thyroid goiter removal.  MEDICATIONS:   Cardura 2 mg p.o. q.d., Atenolol 25 mg p.o. q.d., Synthroid 0.125 mg p.o. q.d.  ALLERGIES:  PENICILLIN.  PHYSICAL EXAMINATION:  GENERAL:  The patient is not in any pain.  VITAL SIGNS:  Height 5 feet.  Weight 190 pounds.  HEAD, EYES, EARS, NOSE AND THROAT:  Normal.  NECK:  No masses.  LUNGS:  Clear to auscultation.  HEART:  Regular rate and rhythm, no murmurs.  ABDOMEN:  Soft.  No palpable flank mass, no costovertebral angle tenderness. Bladder is not palpable.  PELVIC:  Examination not done.  IMPRESSION:  1. Right renal colic. 2. Right renal calculus. 3. Status post right ureteral stent placement.  PLAN:  ESL of right renal calculus with IV sedation in day hospital.  I have discussed with the patient regarding the diagnosis, operative details, alternative treatments, outcome, possible risks and complications and she has agreed for the procedure to be done with IV sedation. Dictated by:   Dennie Maizes, M.D. Attending Physician:  Dennie Maizes DD:  08/21/01 TD:  08/22/01 Job: [id number]

## 2011-03-11 NOTE — H&P (Signed)
[facility]  Patient:    Amber Armstrong, Amber Armstrong Visit Number: [id number] MRN: [id number]          Service Type: DSU Location: DAY Attending Physician:  Dennie Maizes Dictated by:   Dennie Maizes, M.D. Admit Date:  07/25/2001   CC:         Elfredia Nevins, M.D.   History and Physical  CHIEF COMPLAINT: Right flank pain.  HISTORY OF PRESENT ILLNESS: This 60 year old female has been referred to me by Dr. Sherwood Gambler for evaluation and management of right renal colic.  She is known to me from prior office visits.  She was seen in 1994 and treated with ESWL of left renal calculus.  She has a history of recurrent urolithiasis.  She has passed seven or eight small stones since her last visit in 1995.  She has been experiencing intermittent right flank pain radiating to the front for the past three days.  She was evaluated by Dr. Sherwood Gambler.  A CT of the abdomen and pelvis with contrast has been done.  This revealed two small right renal calculi.  There is a large 8 x 5 mm size right upper ureter calculus at the level of the ureteropelvic junction with obstruction and hydronephrosis. Perinephric soft tissue stranding has been noted.  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  The patient continued to have intermittent moderate right flank pain and nausea.  She has undergone cystoscopy, retrograde pyelogram, and right ureter stent placement.  She is brought to the day hospital today for ESWL of right upper ureter calculus.  PAST MEDICAL HISTORY:  1. History of hypertension.  2. Hypothyroidism.  3. Status post thyroid surgery for removal of goiter.  4. Status post ESWL of left renal calculus.  5. History of recurrent urolithiasis.  MEDICATIONS:  1. Cardura 2 mg p.o. q.d.  2. Atenolol 25 mg p.o. q.d.  3. Synthroid 1.25 mg p.o. q.d.  ALLERGIES: PENICILLIN.  PHYSICAL EXAMINATION:  VITAL SIGNS: Height 5 feet.  Weight 190 pounds.  HEENT: Normal.  NECK: No  masses.  LUNGS: Clear to auscultation.  HEART: Regular rate and rhythm.  No murmurs.  ABDOMEN: Soft.  No palpable flank mass.  No costovertebral angle tenderness.  GU: Bladder not palpable.  PELVIC: Examination not done.  IMPRESSION: Right renal colic and right upper ureter calculus with obstruction, status post right ureter stent placement.  PLAN: I discussed with the patient regarding the diagnosis and treatment options.  She is scheduled to undergo ESWL of the right upper ureter calculus with IV sedation at [facility].  I have informed her about the diagnosis, operative details, alternative treatment, outcome, possible risks and complications, and she has agreed for the procedure to be done. Dictated by:   Dennie Maizes, M.D. Attending Physician:  Dennie Maizes DD:  07/24/01 TD:  07/25/01 Job: [id number]

## 2011-03-31 ENCOUNTER — Ambulatory Visit (INDEPENDENT_AMBULATORY_CARE_PROVIDER_SITE_OTHER): Payer: BC Managed Care – PPO | Admitting: *Deleted

## 2011-03-31 DIAGNOSIS — Z7901 Long term (current) use of anticoagulants: Secondary | ICD-10-CM

## 2011-03-31 DIAGNOSIS — I4891 Unspecified atrial fibrillation: Secondary | ICD-10-CM

## 2011-03-31 LAB — POCT INR: INR: 2.3

## 2011-04-05 ENCOUNTER — Encounter: Payer: Self-pay | Admitting: Cardiology

## 2011-04-19 ENCOUNTER — Encounter: Payer: Self-pay | Admitting: Cardiology

## 2011-04-19 ENCOUNTER — Ambulatory Visit (INDEPENDENT_AMBULATORY_CARE_PROVIDER_SITE_OTHER): Payer: BC Managed Care – PPO | Admitting: Cardiology

## 2011-04-19 DIAGNOSIS — I4891 Unspecified atrial fibrillation: Secondary | ICD-10-CM

## 2011-04-19 DIAGNOSIS — Z7901 Long term (current) use of anticoagulants: Secondary | ICD-10-CM

## 2011-04-19 DIAGNOSIS — I10 Essential (primary) hypertension: Secondary | ICD-10-CM

## 2011-04-19 DIAGNOSIS — E785 Hyperlipidemia, unspecified: Secondary | ICD-10-CM

## 2011-04-19 MED ORDER — ATORVASTATIN CALCIUM 20 MG PO TABS: 20.0000 mg | ORAL_TABLET | Freq: Every day | ORAL | Status: DC

## 2011-04-19 NOTE — Patient Instructions (Signed)
**Note De-Identified Clark Clowdus Obfuscation** Your physician has recommended you make the following change in your medication: Start taking Atorvastin (Lipitor) 20mg  at bedtime, REMINDER: YOU MUST TAKE THIS MEDICATION EVERY NIGHT  Your physician recommends that you return for lab work in: 6 weeks  Your physician recommends that you start a 2 gram sodium diet, please see handout given to you at today's office visit  Your physician recommends that you schedule a follow-up appointment in: 1 year

## 2011-04-19 NOTE — Assessment & Plan Note (Signed)
Stable, no change in treatment.

## 2011-04-19 NOTE — Progress Notes (Signed)
HPI [name] comes in today for followup, evaluation and management of her chronic A. Fib, hypertension, and mixed hyperlipidemia. She offers no complaints today including chest discomfort or palpitations. 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 She is not clear about her sugar control. She never started the Lipitor because of a flulike illness.  We're treating her with rate control and anticoagulation. Medications reviewed and she seems mostly compliant. Her weight is a difficult the last visit at 201 pounds Past Medical History  Diagnosis Date  . Hypertension   . Diabetes mellitus     Type II  . Arrhythmia     Atrial fib  . Nephrolithiasis   . History of thyroidectomy   . Overweight   . UTI (urinary tract infection)     Past Surgical History  Procedure Date  . Thyroidectomy     Family History  Problem Relation Age of Onset  . Arrhythmia Mother     Atrial fib    History   Social History  . Marital Status: Divorced    Spouse Name: N/A    Number of Children: 0  . Years of Education: N/A   Occupational History  . Employed     [employer]   Social History Main Topics  . Smoking status: Never Smoker   . Smokeless tobacco: Never Used  . Alcohol Use: No  . Drug Use: No  . Sexually Active: Not on file   Other Topics Concern  . Not on file   Social History Narrative   Full time pt works at [employer]. Widowed. No regular exercise.    Allergies  Allergen Reactions  . Penicillins     Current Outpatient Prescriptions  Medication Sig Dispense Refill  . Cinnamon 500 MG TABS Take 500 mg by mouth daily.       Marland Kitchen diltiazem (TIAZAC) 360 MG 24 hr capsule Take 360 mg by mouth daily.        . furosemide (LASIX) 20 MG tablet Take 20 mg by mouth at bedtime.        Marland Kitchen glimepiride (AMARYL) 4 MG tablet Take 4 mg by mouth daily.        Marland Kitchen levothyroxine (SYNTHROID, LEVOTHROID) 175 MCG tablet Take 175 mcg by mouth daily.        . metFORMIN  (GLUCOPHAGE) 500 MG tablet Take 500 mg by mouth 2 (two) times daily.        . metoprolol (TOPROL-XL) 200 MG 24 hr tablet Take 200 mg by mouth daily.        Marland Kitchen warfarin (COUMADIN) 2.5 MG tablet Take 1 tablet (2.5 mg total) by mouth as directed.  45 tablet  2  . atorvastatin (LIPITOR) 20 MG tablet Take 1 tablet (20 mg total) by mouth at bedtime.  30 tablet  11  . DISCONTD: atorvastatin (LIPITOR) 10 MG tablet Take 10 mg by mouth at bedtime.        Marland Kitchen DISCONTD: Omega-3 Fatty Acids (RA FISH OIL) 1000 MG CAPS Take 1,000 mg by mouth daily.          ROS Negative other than HPI.   PE General Appearance: well developed, well nourished in no acute distress, obese HEENT: symmetrical face, PERRLA, Fair dentition Neck: no JVD, thyromegaly, or adenopathy, trachea midline Chest: symmetric without deformity Cardiac: PMI non-displaced, U. Regular rate and rhythm normal S1, S2, no gallop or murmur Lung: clear to ausculation and percussion Vascular: all pulses full  without bruits  Abdominal: nondistended, nontender, good bowel sounds, no HSM, no bruits Extremities: no cyanosis, clubbing , 1+ pitting edema with chronic tightening and brawny changes, no sign of DVT, no varicosities  Skin: normal color, no rashes Neuro: alert and oriented x 3, non-focal Pysch: normal affect Filed Vitals:   04/19/11 1025  BP: 151/92  Pulse: 58  Height: 5\' 1"  (1.549 m)  Weight: 201 lb (91.173 kg)  SpO2: 97%    EKG  Labs and Studies Reviewed.   No results found for this basename: WBC, HGB, HCT, MCV, PLT      Chemistry      Component Value Date/Time   NA 139 07/26/2010 2028   K 3.8 07/26/2010 2028   CL 105 07/26/2010 2028   CO2 26 07/26/2010 2028   BUN 21 07/26/2010 2028   CREATININE 1.01 07/26/2010 2028      Component Value Date/Time   CALCIUM 8.8 07/26/2010 2028   ALKPHOS 77 07/26/2010 2028   AST 13 07/26/2010 2028   ALT 15 07/26/2010 2028   BILITOT 0.5 07/26/2010 2028       Lab Results  Component Value Date     CHOL 212* 07/26/2010   Lab Results  Component Value Date   HDL 43 07/26/2010   Lab Results  Component Value Date   LDLCALC 136* 07/26/2010   Lab Results  Component Value Date   TRIG 166* 07/26/2010   Lab Results  Component Value Date   CHOLHDL 4.9 Ratio 07/26/2010   No results found for this basename: HGBA1C   Lab Results  Component Value Date   ALT 15 07/26/2010   AST 13 07/26/2010   ALKPHOS 77 07/26/2010   BILITOT 0.5 07/26/2010   No results found for this basename: TSH

## 2011-04-19 NOTE — Assessment & Plan Note (Signed)
She never started the Lipitor. We will start with 20 mg of atorvastatin q.h.s. With followup blood work in 6 weeks

## 2011-04-25 ENCOUNTER — Other Ambulatory Visit: Payer: Self-pay | Admitting: Cardiology

## 2011-05-04 ENCOUNTER — Ambulatory Visit (INDEPENDENT_AMBULATORY_CARE_PROVIDER_SITE_OTHER): Payer: BC Managed Care – PPO | Admitting: *Deleted

## 2011-05-04 DIAGNOSIS — Z7901 Long term (current) use of anticoagulants: Secondary | ICD-10-CM

## 2011-05-04 DIAGNOSIS — I4891 Unspecified atrial fibrillation: Secondary | ICD-10-CM

## 2011-05-04 LAB — POCT INR: INR: 2.5

## 2011-06-02 ENCOUNTER — Ambulatory Visit (INDEPENDENT_AMBULATORY_CARE_PROVIDER_SITE_OTHER): Payer: BC Managed Care – PPO | Admitting: *Deleted

## 2011-06-02 DIAGNOSIS — Z7901 Long term (current) use of anticoagulants: Secondary | ICD-10-CM

## 2011-06-02 DIAGNOSIS — I4891 Unspecified atrial fibrillation: Secondary | ICD-10-CM

## 2011-06-29 ENCOUNTER — Ambulatory Visit (INDEPENDENT_AMBULATORY_CARE_PROVIDER_SITE_OTHER): Payer: BC Managed Care – PPO | Admitting: *Deleted

## 2011-06-29 DIAGNOSIS — I4891 Unspecified atrial fibrillation: Secondary | ICD-10-CM

## 2011-06-29 DIAGNOSIS — Z7901 Long term (current) use of anticoagulants: Secondary | ICD-10-CM

## 2011-07-12 ENCOUNTER — Other Ambulatory Visit: Payer: Self-pay | Admitting: *Deleted

## 2011-07-12 MED ORDER — WARFARIN SODIUM 2.5 MG PO TABS: 2.5000 mg | ORAL_TABLET | ORAL | Status: DC

## 2011-07-13 ENCOUNTER — Other Ambulatory Visit: Payer: Self-pay | Admitting: Cardiology

## 2011-07-27 ENCOUNTER — Ambulatory Visit (INDEPENDENT_AMBULATORY_CARE_PROVIDER_SITE_OTHER): Payer: BC Managed Care – PPO | Admitting: *Deleted

## 2011-07-27 DIAGNOSIS — I4891 Unspecified atrial fibrillation: Secondary | ICD-10-CM

## 2011-07-27 DIAGNOSIS — Z7901 Long term (current) use of anticoagulants: Secondary | ICD-10-CM

## 2011-07-27 LAB — POCT INR: INR: 1.9

## 2011-08-24 ENCOUNTER — Ambulatory Visit (INDEPENDENT_AMBULATORY_CARE_PROVIDER_SITE_OTHER): Payer: BC Managed Care – PPO | Admitting: *Deleted

## 2011-08-24 DIAGNOSIS — Z7901 Long term (current) use of anticoagulants: Secondary | ICD-10-CM

## 2011-08-24 DIAGNOSIS — I4891 Unspecified atrial fibrillation: Secondary | ICD-10-CM

## 2011-08-24 LAB — POCT INR: INR: 2.1

## 2011-09-06 ENCOUNTER — Other Ambulatory Visit: Payer: Self-pay | Admitting: *Deleted

## 2011-09-06 MED ORDER — WARFARIN SODIUM 2.5 MG PO TABS: 2.5000 mg | ORAL_TABLET | ORAL | Status: DC

## 2011-09-21 ENCOUNTER — Ambulatory Visit (INDEPENDENT_AMBULATORY_CARE_PROVIDER_SITE_OTHER): Payer: BC Managed Care – PPO | Admitting: *Deleted

## 2011-09-21 DIAGNOSIS — I4891 Unspecified atrial fibrillation: Secondary | ICD-10-CM

## 2011-09-21 DIAGNOSIS — Z7901 Long term (current) use of anticoagulants: Secondary | ICD-10-CM

## 2011-10-10 ENCOUNTER — Other Ambulatory Visit: Payer: Self-pay | Admitting: Cardiology

## 2011-10-26 ENCOUNTER — Ambulatory Visit (INDEPENDENT_AMBULATORY_CARE_PROVIDER_SITE_OTHER): Payer: BC Managed Care – PPO | Admitting: *Deleted

## 2011-10-26 DIAGNOSIS — Z7901 Long term (current) use of anticoagulants: Secondary | ICD-10-CM

## 2011-10-26 DIAGNOSIS — I4891 Unspecified atrial fibrillation: Secondary | ICD-10-CM

## 2011-10-26 LAB — POCT INR: INR: 1.8

## 2011-11-16 ENCOUNTER — Ambulatory Visit (INDEPENDENT_AMBULATORY_CARE_PROVIDER_SITE_OTHER): Payer: BC Managed Care – PPO | Admitting: *Deleted

## 2011-11-16 DIAGNOSIS — Z7901 Long term (current) use of anticoagulants: Secondary | ICD-10-CM

## 2011-11-16 DIAGNOSIS — I4891 Unspecified atrial fibrillation: Secondary | ICD-10-CM

## 2011-11-23 ENCOUNTER — Ambulatory Visit (INDEPENDENT_AMBULATORY_CARE_PROVIDER_SITE_OTHER): Payer: BC Managed Care – PPO | Admitting: *Deleted

## 2011-11-23 DIAGNOSIS — Z7901 Long term (current) use of anticoagulants: Secondary | ICD-10-CM

## 2011-11-23 DIAGNOSIS — I4891 Unspecified atrial fibrillation: Secondary | ICD-10-CM

## 2011-11-23 MED ORDER — WARFARIN SODIUM 2.5 MG PO TABS: 2.5000 mg | ORAL_TABLET | ORAL | Status: DC

## 2011-12-14 ENCOUNTER — Ambulatory Visit (INDEPENDENT_AMBULATORY_CARE_PROVIDER_SITE_OTHER): Payer: BC Managed Care – PPO | Admitting: *Deleted

## 2011-12-14 DIAGNOSIS — Z7901 Long term (current) use of anticoagulants: Secondary | ICD-10-CM

## 2011-12-14 DIAGNOSIS — I4891 Unspecified atrial fibrillation: Secondary | ICD-10-CM

## 2012-01-11 ENCOUNTER — Ambulatory Visit (INDEPENDENT_AMBULATORY_CARE_PROVIDER_SITE_OTHER): Payer: BC Managed Care – PPO | Admitting: *Deleted

## 2012-01-11 DIAGNOSIS — I4891 Unspecified atrial fibrillation: Secondary | ICD-10-CM

## 2012-01-11 DIAGNOSIS — Z7901 Long term (current) use of anticoagulants: Secondary | ICD-10-CM

## 2012-02-09 ENCOUNTER — Ambulatory Visit (INDEPENDENT_AMBULATORY_CARE_PROVIDER_SITE_OTHER): Payer: BC Managed Care – PPO | Admitting: *Deleted

## 2012-02-09 DIAGNOSIS — I4891 Unspecified atrial fibrillation: Secondary | ICD-10-CM

## 2012-02-09 DIAGNOSIS — Z7901 Long term (current) use of anticoagulants: Secondary | ICD-10-CM

## 2012-03-07 ENCOUNTER — Ambulatory Visit (INDEPENDENT_AMBULATORY_CARE_PROVIDER_SITE_OTHER): Payer: BC Managed Care – PPO | Admitting: *Deleted

## 2012-03-07 DIAGNOSIS — I4891 Unspecified atrial fibrillation: Secondary | ICD-10-CM

## 2012-03-07 DIAGNOSIS — Z7901 Long term (current) use of anticoagulants: Secondary | ICD-10-CM

## 2012-03-07 LAB — POCT INR: INR: 3.1

## 2012-03-10 ENCOUNTER — Other Ambulatory Visit: Payer: Self-pay | Admitting: Cardiology

## 2012-03-19 ENCOUNTER — Other Ambulatory Visit: Payer: Self-pay | Admitting: Cardiology

## 2012-03-21 ENCOUNTER — Telehealth: Payer: Self-pay | Admitting: Cardiology

## 2012-03-21 MED ORDER — FUROSEMIDE 20 MG PO TABS: 20.0000 mg | ORAL_TABLET | Freq: Every day | ORAL | Status: DC

## 2012-03-21 NOTE — Telephone Encounter (Signed)
Pt needs lasix called in she has one pill left/tmj

## 2012-03-21 NOTE — Telephone Encounter (Signed)
rx sent into pharmacy, pt mother aware/ct

## 2012-03-23 ENCOUNTER — Other Ambulatory Visit: Payer: Self-pay | Admitting: Cardiology

## 2012-03-28 ENCOUNTER — Encounter: Payer: Self-pay | Admitting: Cardiology

## 2012-03-28 ENCOUNTER — Ambulatory Visit (INDEPENDENT_AMBULATORY_CARE_PROVIDER_SITE_OTHER): Payer: BC Managed Care – PPO | Admitting: Cardiology

## 2012-03-28 ENCOUNTER — Other Ambulatory Visit: Payer: Self-pay

## 2012-03-28 VITALS — BP 170/92 | HR 75 | Resp 16 | Ht 60.0 in | Wt 205.0 lb

## 2012-03-28 DIAGNOSIS — E785 Hyperlipidemia, unspecified: Secondary | ICD-10-CM

## 2012-03-28 DIAGNOSIS — E119 Type 2 diabetes mellitus without complications: Secondary | ICD-10-CM

## 2012-03-28 DIAGNOSIS — Z7901 Long term (current) use of anticoagulants: Secondary | ICD-10-CM

## 2012-03-28 DIAGNOSIS — Z6841 Body Mass Index (BMI) 40.0 and over, adult: Secondary | ICD-10-CM

## 2012-03-28 DIAGNOSIS — I10 Essential (primary) hypertension: Secondary | ICD-10-CM

## 2012-03-28 DIAGNOSIS — R609 Edema, unspecified: Secondary | ICD-10-CM

## 2012-03-28 DIAGNOSIS — I4891 Unspecified atrial fibrillation: Secondary | ICD-10-CM

## 2012-03-28 DIAGNOSIS — R600 Localized edema: Secondary | ICD-10-CM | POA: Insufficient documentation

## 2012-03-28 MED ORDER — LISINOPRIL 20 MG PO TABS: 20.0000 mg | ORAL_TABLET | Freq: Every day | ORAL | Status: DC

## 2012-03-28 MED ORDER — METOPROLOL SUCCINATE ER 200 MG PO TB24: 200.0000 mg | ORAL_TABLET | Freq: Every day | ORAL | Status: DC

## 2012-03-28 NOTE — Patient Instructions (Addendum)
**Note De-Identified Bill Yohn Obfuscation** Your physician recommends that you return for lab work in: 2 weeks  Your physician has recommended you make the following change in your medication: start taking Lisinopril 20 mg daily  Your physician recommends that you schedule a follow-up appointment in: blood pressure check in 2 weeks and in 6 months with provider

## 2012-03-28 NOTE — Assessment & Plan Note (Signed)
I strongly encouraged her to lose weight.

## 2012-03-28 NOTE — Assessment & Plan Note (Signed)
Not under good control. With her diabetes, we'll add lisinopril 20 mg per day. We'll have blood pressure check as well as metabolic profile in 10 days to 2 weeks. Salt restriction increased activity and weight loss reinforced.

## 2012-03-28 NOTE — Assessment & Plan Note (Signed)
Asymptomatic. Continue rate control and anticoagulation. 

## 2012-03-28 NOTE — Progress Notes (Signed)
HPI  Amber Armstrong comes in today for evaluation and management of her chronic A. fib, anticoagulation, hypertension, and type 2 diabetes mellitus.  She is still working at [employer]. He stands and sits all day. She is ready to retire.  She has been noncompliant with her diet particularly with sodium. She has not lost any weight.  As the chest pain palpitations shortness of breath. She denies any orthopnea PND.  She denies any melena or hematochezia. Past Medical History  Diagnosis Date  . Hypertension   . Diabetes mellitus     Type II  . Arrhythmia     Atrial fib  . Nephrolithiasis   . History of thyroidectomy   . Overweight   . UTI (urinary tract infection)     Current Outpatient Prescriptions  Medication Sig Dispense Refill  . Cinnamon 500 MG TABS Take 500 mg by mouth daily.       Marland Kitchen diltiazem (TIAZAC) 360 MG 24 hr capsule TAKE ONE CAPSULE BY MOUTH EVERY DAY  30 capsule  3  . furosemide (LASIX) 20 MG tablet Take 1 tablet (20 mg total) by mouth at bedtime.  30 tablet  6  . insulin glargine (LANTUS) 100 UNIT/ML injection Inject 70 Units into the skin at bedtime.        Marland Kitchen levothyroxine (SYNTHROID, LEVOTHROID) 175 MCG tablet Take 175 mcg by mouth daily.        . metFORMIN (GLUCOPHAGE) 500 MG tablet Take 500 mg by mouth 2 (two) times daily.        . metoprolol (TOPROL-XL) 200 MG 24 hr tablet TAKE ONE TABLET BY MOUTH EVERY DAY  30 tablet  5  . Omega-3 Fatty Acids (FISH OIL PO) Take by mouth daily.      Marland Kitchen warfarin (COUMADIN) 2.5 MG tablet TAKE ONE TO TWO TABLETS BY MOUTH EVERY DAY AS DIRECTED  60 tablet  3    Allergies  Allergen Reactions  . Penicillins     Family History  Problem Relation Age of Onset  . Arrhythmia Mother     Atrial fib    History   Social History  . Marital Status: Divorced    Spouse Name: N/A    Number of Children: 0  . Years of Education: N/A   Occupational History  . Employed     [employer]   Social History Main Topics  . Smoking status: Never  Smoker   . Smokeless tobacco: Never Used  . Alcohol Use: No  . Drug Use: No  . Sexually Active: Not on file   Other Topics Concern  . Not on file   Social History Narrative   Full time pt works at [employer]. Widowed. No regular exercise.    ROS ALL NEGATIVE EXCEPT THOSE NOTED IN HPI  PE  General Appearance: well developed, well nourished in no acute distress, obese HEENT: symmetrical face, PERRLA, poor dentition  Neck: no JVD, thyromegaly, or adenopathy, trachea midline Chest: symmetric without deformity Cardiac: PMI non-displaced, irregular rate and rhythm, normal S1, S2, no gallop or murmur Lung: clear to ausculation and percussion Vascular: all pulses full without bruits  Abdominal: nondistended, nontender, good bowel sounds, no HSM, no bruits Extremities: no cyanosis, 1-2+ pitting edema bilaterally mid tibia down., no sign of DVT, no varicosities  Skin: normal color, no rashes Neuro: alert and oriented x 3, non-focal Pysch: normal affect  EKG  BMET    Component Value Date/Time   NA 139 07/26/2010 2028   K 3.8 07/26/2010 2028  CL 105 07/26/2010 2028   CO2 26 07/26/2010 2028   GLUCOSE 228* 07/26/2010 2028   BUN 21 07/26/2010 2028   CREATININE 1.01 07/26/2010 2028   CALCIUM 8.8 07/26/2010 2028    Lipid Panel     Component Value Date/Time   CHOL 212* 07/26/2010 2028   TRIG 166* 07/26/2010 2028   HDL 43 07/26/2010 2028   CHOLHDL 4.9 Ratio 07/26/2010 2028   VLDL 33 07/26/2010 2028   LDLCALC 136* 07/26/2010 2028    CBC No results found for this basename: wbc, rbc, hgb, hct, plt, mcv, mch, mchc, rdw, neutrabs, lymphsabs, monoabs, eosabs, basosabs

## 2012-04-04 ENCOUNTER — Ambulatory Visit (INDEPENDENT_AMBULATORY_CARE_PROVIDER_SITE_OTHER): Payer: BC Managed Care – PPO | Admitting: *Deleted

## 2012-04-04 DIAGNOSIS — Z7901 Long term (current) use of anticoagulants: Secondary | ICD-10-CM

## 2012-04-04 DIAGNOSIS — I4891 Unspecified atrial fibrillation: Secondary | ICD-10-CM

## 2012-04-11 ENCOUNTER — Ambulatory Visit (INDEPENDENT_AMBULATORY_CARE_PROVIDER_SITE_OTHER): Payer: BC Managed Care – PPO | Admitting: *Deleted

## 2012-04-11 VITALS — BP 152/84 | HR 46 | Wt 204.0 lb

## 2012-04-11 DIAGNOSIS — I10 Essential (primary) hypertension: Secondary | ICD-10-CM

## 2012-04-11 NOTE — Progress Notes (Signed)
Presents for return blood pressure check.  Medications reconciled by list.  No complaints noted at this time.

## 2012-04-12 LAB — COMPREHENSIVE METABOLIC PANEL
BUN: 23 mg/dL (ref 6–23)
CO2: 30 mEq/L (ref 19–32)
Calcium: 8.8 mg/dL (ref 8.4–10.5)
Chloride: 103 mEq/L (ref 96–112)
Creat: 1.09 mg/dL (ref 0.50–1.10)

## 2012-05-01 ENCOUNTER — Ambulatory Visit (HOSPITAL_COMMUNITY)
Admission: RE | Admit: 2012-05-01 | Discharge: 2012-05-01 | Disposition: A | Discharge status: Home / Self Care | Payer: BC Managed Care – PPO | Source: Ambulatory Visit | Attending: Family Medicine | Admitting: Family Medicine

## 2012-05-01 ENCOUNTER — Other Ambulatory Visit (HOSPITAL_COMMUNITY): Payer: Self-pay | Admitting: Family Medicine

## 2012-05-01 DIAGNOSIS — M25549 Pain in joints of unspecified hand: Secondary | ICD-10-CM | POA: Insufficient documentation

## 2012-05-02 LAB — PROTIME-INR: INR: 2.1 — AB (ref 0.9–1.1)

## 2012-05-03 ENCOUNTER — Encounter: Payer: Self-pay | Admitting: Cardiology

## 2012-05-07 ENCOUNTER — Encounter: Payer: Self-pay | Admitting: Cardiology

## 2012-05-09 ENCOUNTER — Ambulatory Visit (INDEPENDENT_AMBULATORY_CARE_PROVIDER_SITE_OTHER): Payer: BC Managed Care – PPO | Admitting: *Deleted

## 2012-05-09 DIAGNOSIS — Z7901 Long term (current) use of anticoagulants: Secondary | ICD-10-CM

## 2012-05-09 DIAGNOSIS — I4891 Unspecified atrial fibrillation: Secondary | ICD-10-CM

## 2012-06-04 ENCOUNTER — Ambulatory Visit (INDEPENDENT_AMBULATORY_CARE_PROVIDER_SITE_OTHER): Payer: BC Managed Care – PPO | Admitting: *Deleted

## 2012-06-04 DIAGNOSIS — Z7901 Long term (current) use of anticoagulants: Secondary | ICD-10-CM

## 2012-06-04 DIAGNOSIS — I4891 Unspecified atrial fibrillation: Secondary | ICD-10-CM

## 2012-06-13 ENCOUNTER — Other Ambulatory Visit: Payer: Self-pay | Admitting: Cardiology

## 2012-07-05 ENCOUNTER — Ambulatory Visit (INDEPENDENT_AMBULATORY_CARE_PROVIDER_SITE_OTHER): Payer: BC Managed Care – PPO | Admitting: *Deleted

## 2012-07-05 DIAGNOSIS — Z7901 Long term (current) use of anticoagulants: Secondary | ICD-10-CM

## 2012-07-05 DIAGNOSIS — I4891 Unspecified atrial fibrillation: Secondary | ICD-10-CM

## 2012-07-05 LAB — POCT INR: INR: 2.5

## 2012-07-09 ENCOUNTER — Other Ambulatory Visit: Payer: Self-pay | Admitting: Cardiology

## 2012-07-23 NOTE — Telephone Encounter (Signed)
Error/tg °

## 2012-08-01 ENCOUNTER — Other Ambulatory Visit: Payer: Self-pay | Admitting: Cardiology

## 2012-08-01 NOTE — Telephone Encounter (Signed)
Please Review and refill, Thank You. 

## 2012-08-16 ENCOUNTER — Ambulatory Visit (INDEPENDENT_AMBULATORY_CARE_PROVIDER_SITE_OTHER): Payer: BC Managed Care – PPO | Admitting: *Deleted

## 2012-08-16 DIAGNOSIS — Z7901 Long term (current) use of anticoagulants: Secondary | ICD-10-CM

## 2012-08-16 DIAGNOSIS — I4891 Unspecified atrial fibrillation: Secondary | ICD-10-CM

## 2012-09-06 ENCOUNTER — Ambulatory Visit (INDEPENDENT_AMBULATORY_CARE_PROVIDER_SITE_OTHER): Payer: BC Managed Care – PPO | Admitting: *Deleted

## 2012-09-06 DIAGNOSIS — Z7901 Long term (current) use of anticoagulants: Secondary | ICD-10-CM

## 2012-09-06 DIAGNOSIS — I4891 Unspecified atrial fibrillation: Secondary | ICD-10-CM

## 2012-10-09 ENCOUNTER — Other Ambulatory Visit: Payer: Self-pay | Admitting: *Deleted

## 2012-10-09 MED ORDER — METOPROLOL SUCCINATE ER 200 MG PO TB24: 200.0000 mg | ORAL_TABLET | Freq: Every day | ORAL | Status: DC

## 2012-10-09 MED ORDER — DILTIAZEM HCL ER BEADS 360 MG PO CP24: 360.0000 mg | ORAL_CAPSULE | Freq: Every day | ORAL | Status: DC

## 2012-10-10 ENCOUNTER — Ambulatory Visit: Payer: BC Managed Care – PPO | Admitting: Cardiology

## 2012-10-10 ENCOUNTER — Ambulatory Visit (INDEPENDENT_AMBULATORY_CARE_PROVIDER_SITE_OTHER): Payer: BC Managed Care – PPO | Admitting: *Deleted

## 2012-10-10 DIAGNOSIS — I4891 Unspecified atrial fibrillation: Secondary | ICD-10-CM

## 2012-10-10 DIAGNOSIS — Z7901 Long term (current) use of anticoagulants: Secondary | ICD-10-CM

## 2012-10-15 ENCOUNTER — Other Ambulatory Visit: Payer: Self-pay | Admitting: Cardiology

## 2012-10-25 ENCOUNTER — Ambulatory Visit (INDEPENDENT_AMBULATORY_CARE_PROVIDER_SITE_OTHER): Payer: BC Managed Care – PPO | Admitting: *Deleted

## 2012-10-25 DIAGNOSIS — I4891 Unspecified atrial fibrillation: Secondary | ICD-10-CM

## 2012-10-25 DIAGNOSIS — Z7901 Long term (current) use of anticoagulants: Secondary | ICD-10-CM

## 2012-10-25 LAB — POCT INR: INR: 1.9

## 2012-10-25 MED ORDER — WARFARIN SODIUM 2.5 MG PO TABS: ORAL_TABLET | ORAL | Status: DC

## 2012-11-26 ENCOUNTER — Ambulatory Visit (INDEPENDENT_AMBULATORY_CARE_PROVIDER_SITE_OTHER): Payer: BC Managed Care – PPO | Admitting: *Deleted

## 2012-11-26 DIAGNOSIS — Z7901 Long term (current) use of anticoagulants: Secondary | ICD-10-CM

## 2012-11-26 DIAGNOSIS — I4891 Unspecified atrial fibrillation: Secondary | ICD-10-CM

## 2012-11-26 LAB — POCT INR: INR: 3.9

## 2012-12-12 ENCOUNTER — Encounter: Payer: Self-pay | Admitting: Cardiology

## 2012-12-12 ENCOUNTER — Ambulatory Visit (INDEPENDENT_AMBULATORY_CARE_PROVIDER_SITE_OTHER): Payer: BC Managed Care – PPO | Admitting: *Deleted

## 2012-12-12 ENCOUNTER — Ambulatory Visit (INDEPENDENT_AMBULATORY_CARE_PROVIDER_SITE_OTHER): Payer: BC Managed Care – PPO | Admitting: Cardiology

## 2012-12-12 VITALS — BP 135/85 | HR 63 | Ht 60.0 in | Wt 202.1 lb

## 2012-12-12 DIAGNOSIS — I4891 Unspecified atrial fibrillation: Secondary | ICD-10-CM

## 2012-12-12 DIAGNOSIS — I517 Cardiomegaly: Secondary | ICD-10-CM

## 2012-12-12 DIAGNOSIS — E785 Hyperlipidemia, unspecified: Secondary | ICD-10-CM

## 2012-12-12 DIAGNOSIS — I10 Essential (primary) hypertension: Secondary | ICD-10-CM

## 2012-12-12 DIAGNOSIS — R609 Edema, unspecified: Secondary | ICD-10-CM

## 2012-12-12 DIAGNOSIS — Z6841 Body Mass Index (BMI) 40.0 and over, adult: Secondary | ICD-10-CM

## 2012-12-12 DIAGNOSIS — Z7901 Long term (current) use of anticoagulants: Secondary | ICD-10-CM

## 2012-12-12 HISTORY — DX: Cardiomegaly: I51.7

## 2012-12-12 LAB — POCT INR: INR: 3.1

## 2012-12-12 NOTE — Assessment & Plan Note (Addendum)
She says her lipids have been good. I do not have any recent values. With her diabetes, she probably should. Followup with primary care with blood work before starting drug. Goal LDL certainly less than 100.

## 2012-12-12 NOTE — Assessment & Plan Note (Signed)
Asymptomatic. Continue rate control and anticoagulation. Return the office in one year.

## 2012-12-12 NOTE — Patient Instructions (Addendum)
Your physician recommends that you schedule a follow-up appointment in: ONE YEAR  Your physician recommends that you continue on your current medications as directed. Please refer to the Current Medication list given to you today.  

## 2012-12-12 NOTE — Assessment & Plan Note (Signed)
Improved. No change in current medications.

## 2012-12-12 NOTE — Progress Notes (Signed)
HPI Ms Raphael returns today for evaluation and management of her history of chronic A. fib, morbid obesity, right-sided ventricular dysfunction, and other cardiac risk factors.  She denies any angina or chest pain. She is still working at [employer]. Her weight has been relatively stable. She denies orthopnea, PND and her edema has been stable. She denies any palpitations or presyncope or syncope. She's very compliant with her medications.  Blood work is followed by primary care at [facility]. She does not know her hemoglobin A1c but her blood sugars average around 110. She says her cholesterol is good. She's not on a statin.  She denies any problems with anticoagulation. She's had no bleeding or melena.  Past Medical History  Diagnosis Date  . Hypertension   . Diabetes mellitus     Type II  . Arrhythmia     Atrial fib  . Nephrolithiasis   . History of thyroidectomy   . Overweight   . UTI (urinary tract infection)     Current Outpatient Prescriptions  Medication Sig Dispense Refill  . bimatoprost (LUMIGAN) 0.03 % ophthalmic solution Place 1 drop into both eyes at bedtime.      . Cinnamon 500 MG TABS Take 500 mg by mouth daily.       Marland Kitchen diltiazem (TAZTIA XT) 360 MG 24 hr capsule Take 1 capsule (360 mg total) by mouth daily.  90 capsule  1  . furosemide (LASIX) 20 MG tablet TAKE ONE TABLET BY MOUTH EVERY DAY AT BEDTIME  30 tablet  5  . insulin glargine (LANTUS) 100 UNIT/ML injection Inject 70 Units into the skin at bedtime.        Marland Kitchen levothyroxine (SYNTHROID, LEVOTHROID) 175 MCG tablet Take 175 mcg by mouth daily.        Marland Kitchen lisinopril (PRINIVIL,ZESTRIL) 20 MG tablet Take 1 tablet (20 mg total) by mouth daily.  90 tablet  1  . metFORMIN (GLUCOPHAGE) 500 MG tablet Take 500 mg by mouth 2 (two) times daily.        . metoprolol (TOPROL-XL) 200 MG 24 hr tablet Take 1 tablet (200 mg total) by mouth daily.  90 tablet  1  . warfarin (COUMADIN) 2.5 MG tablet Take 2 tablets daily except 1 tablet on  Wednesdays  60 tablet  3   No current facility-administered medications for this visit.    Allergies  Allergen Reactions  . Penicillins     Family History  Problem Relation Age of Onset  . Arrhythmia Mother     Atrial fib    History   Social History  . Marital Status: Divorced    Spouse Name: N/A    Number of Children: 0  . Years of Education: N/A   Occupational History  . Employed     [employer]   Social History Main Topics  . Smoking status: Never Smoker   . Smokeless tobacco: Never Used  . Alcohol Use: No  . Drug Use: No  . Sexually Active: Not on file   Other Topics Concern  . Not on file   Social History Narrative   Full time pt works at [employer]. Widowed. No regular exercise.    ROS ALL NEGATIVE EXCEPT THOSE NOTED IN HPI  PE  General Appearance: well developed, well nourished in no acute distress, obese HEENT: symmetrical face, PERRLA,  Neck: no JVD, thyromegaly, or adenopathy, trachea midline Chest: symmetric without deformity Cardiac: PMI non-displaced, RRR, normal S1, S2, no gallop or murmur Lung: clear to ausculation and percussion  Vascular: all pulses full without bruits  Abdominal: nondistended, nontender, good bowel sounds, no HSM, no bruits Extremities: no cyanosis, clubbing , 1+ bilateral pitting edema no sign of DVT, no varicosities  Skin: normal color, no rashes Neuro: alert and oriented x 3, non-focal Pysch: normal affect  EKG chronic A. fib, low-voltage, right axis deviation consistent with right ventricular hypertrophy, nonspecific ST wave changes  BMET    Component Value Date/Time   NA 143 04/11/2012 0953   K 3.8 04/11/2012 0953   CL 103 04/11/2012 0953   CO2 30 04/11/2012 0953   GLUCOSE 181* 04/11/2012 0953   BUN 23 04/11/2012 0953   CREATININE 1.09 04/11/2012 0953   CREATININE 1.01 07/26/2010 2028   CALCIUM 8.8 04/11/2012 0953    Lipid Panel     Component Value Date/Time   CHOL 212* 07/26/2010 2028   TRIG 166*  07/26/2010 2028   HDL 43 07/26/2010 2028   CHOLHDL 4.9 Ratio 07/26/2010 2028   VLDL 33 07/26/2010 2028   LDLCALC 136* 07/26/2010 2028    CBC No results found for this basename: wbc, rbc, hgb, hct, plt, mcv, mch, mchc, rdw, neutrabs, lymphsabs, monoabs, eosabs, basosabs

## 2012-12-12 NOTE — Assessment & Plan Note (Signed)
Good control

## 2013-01-02 ENCOUNTER — Ambulatory Visit (INDEPENDENT_AMBULATORY_CARE_PROVIDER_SITE_OTHER): Payer: BC Managed Care – PPO | Admitting: *Deleted

## 2013-01-02 DIAGNOSIS — Z7901 Long term (current) use of anticoagulants: Secondary | ICD-10-CM

## 2013-01-02 LAB — POCT INR: INR: 3.1

## 2013-01-07 ENCOUNTER — Encounter: Payer: Self-pay | Admitting: Cardiology

## 2013-01-23 ENCOUNTER — Ambulatory Visit (INDEPENDENT_AMBULATORY_CARE_PROVIDER_SITE_OTHER): Payer: BC Managed Care – PPO | Admitting: *Deleted

## 2013-01-23 DIAGNOSIS — Z7901 Long term (current) use of anticoagulants: Secondary | ICD-10-CM

## 2013-01-23 DIAGNOSIS — I4891 Unspecified atrial fibrillation: Secondary | ICD-10-CM

## 2013-01-23 LAB — POCT INR: INR: 2.1

## 2013-02-05 ENCOUNTER — Telehealth: Payer: Self-pay | Admitting: *Deleted

## 2013-02-05 NOTE — Telephone Encounter (Signed)
PLEASE ADVISE.

## 2013-02-05 NOTE — Telephone Encounter (Signed)
Received a phone call from [place] from the office of Seward Speck, DDS.  Patient will need multiple teeth extracted and they are inquiring about a plan for anticoagulation therapy, prior to this procedure.  Phone number is [phone] Fax [phone].  Please advise.

## 2013-02-07 NOTE — Telephone Encounter (Signed)
I spoke with pt and she will be having multiple teeth extractions. She is on coumadin for her chronic atrial fibrillation.  She is aware Dr. Daleen Squibb is out of the office and will be back on 02/12/13.  Reviewed with Peyton Najjar & pt has never had a CVA so she could stop her Coumadin without Lovenox overlap if Dr. Daleen Squibb agrees.  Pt states this is not an urgent nature.  I also spoke with Inetta Fermo at Dr. Juanetta Gosling office about multiple dental extractions needed. They will fax clearance today to the [place] office. Aware pt will not need lovenox overlap & Dr. Daleen Squibb will be in the office on Tuesday. States pt has needed this done for years but has been postponing.  Mylo Red RN

## 2013-02-14 ENCOUNTER — Encounter: Payer: Self-pay | Admitting: *Deleted

## 2013-02-20 ENCOUNTER — Ambulatory Visit (INDEPENDENT_AMBULATORY_CARE_PROVIDER_SITE_OTHER): Payer: BC Managed Care – PPO | Admitting: *Deleted

## 2013-02-20 DIAGNOSIS — I4891 Unspecified atrial fibrillation: Secondary | ICD-10-CM

## 2013-02-20 DIAGNOSIS — Z7901 Long term (current) use of anticoagulants: Secondary | ICD-10-CM

## 2013-02-20 LAB — POCT INR: INR: 4.5

## 2013-03-07 ENCOUNTER — Ambulatory Visit (INDEPENDENT_AMBULATORY_CARE_PROVIDER_SITE_OTHER): Payer: BC Managed Care – PPO | Admitting: *Deleted

## 2013-03-07 DIAGNOSIS — Z7901 Long term (current) use of anticoagulants: Secondary | ICD-10-CM

## 2013-03-07 DIAGNOSIS — I4891 Unspecified atrial fibrillation: Secondary | ICD-10-CM

## 2013-03-07 LAB — POCT INR: INR: 3.9

## 2013-03-27 ENCOUNTER — Ambulatory Visit (INDEPENDENT_AMBULATORY_CARE_PROVIDER_SITE_OTHER): Payer: BC Managed Care – PPO | Admitting: *Deleted

## 2013-03-27 DIAGNOSIS — Z7901 Long term (current) use of anticoagulants: Secondary | ICD-10-CM

## 2013-03-27 DIAGNOSIS — I4891 Unspecified atrial fibrillation: Secondary | ICD-10-CM

## 2013-04-02 ENCOUNTER — Other Ambulatory Visit: Payer: Self-pay | Admitting: Cardiology

## 2013-04-02 ENCOUNTER — Other Ambulatory Visit: Payer: Self-pay | Admitting: *Deleted

## 2013-04-02 MED ORDER — FUROSEMIDE 20 MG PO TABS: 20.0000 mg | ORAL_TABLET | Freq: Every day | ORAL | Status: DC

## 2013-04-14 ENCOUNTER — Other Ambulatory Visit: Payer: Self-pay

## 2013-04-14 ENCOUNTER — Encounter (HOSPITAL_COMMUNITY): Payer: Self-pay | Admitting: Emergency Medicine

## 2013-04-14 ENCOUNTER — Inpatient Hospital Stay (HOSPITAL_COMMUNITY)
Admission: EM | Admit: 2013-04-14 | Discharge: 2013-04-29 | DRG: 550 | Disposition: A | Discharge status: Skilled Nursing Facility | Payer: BC Managed Care – PPO | Attending: Internal Medicine | Admitting: Internal Medicine

## 2013-04-14 ENCOUNTER — Emergency Department (HOSPITAL_COMMUNITY): Payer: BC Managed Care – PPO

## 2013-04-14 DIAGNOSIS — T502X5A Adverse effect of carbonic-anhydrase inhibitors, benzothiadiazides and other diuretics, initial encounter: Secondary | ICD-10-CM | POA: Diagnosis present

## 2013-04-14 DIAGNOSIS — Z6841 Body Mass Index (BMI) 40.0 and over, adult: Secondary | ICD-10-CM

## 2013-04-14 DIAGNOSIS — E785 Hyperlipidemia, unspecified: Secondary | ICD-10-CM | POA: Diagnosis present

## 2013-04-14 DIAGNOSIS — I10 Essential (primary) hypertension: Secondary | ICD-10-CM

## 2013-04-14 DIAGNOSIS — Z87442 Personal history of urinary calculi: Secondary | ICD-10-CM

## 2013-04-14 DIAGNOSIS — E1122 Type 2 diabetes mellitus with diabetic chronic kidney disease: Secondary | ICD-10-CM | POA: Diagnosis present

## 2013-04-14 DIAGNOSIS — IMO0002 Reserved for concepts with insufficient information to code with codable children: Secondary | ICD-10-CM | POA: Diagnosis not present

## 2013-04-14 DIAGNOSIS — I729 Aneurysm of unspecified site: Secondary | ICD-10-CM | POA: Diagnosis not present

## 2013-04-14 DIAGNOSIS — E1129 Type 2 diabetes mellitus with other diabetic kidney complication: Secondary | ICD-10-CM | POA: Diagnosis present

## 2013-04-14 DIAGNOSIS — J9600 Acute respiratory failure, unspecified whether with hypoxia or hypercapnia: Secondary | ICD-10-CM

## 2013-04-14 DIAGNOSIS — E119 Type 2 diabetes mellitus without complications: Secondary | ICD-10-CM

## 2013-04-14 DIAGNOSIS — N189 Chronic kidney disease, unspecified: Secondary | ICD-10-CM | POA: Diagnosis present

## 2013-04-14 DIAGNOSIS — N179 Acute kidney failure, unspecified: Secondary | ICD-10-CM

## 2013-04-14 DIAGNOSIS — I2789 Other specified pulmonary heart diseases: Secondary | ICD-10-CM | POA: Diagnosis present

## 2013-04-14 DIAGNOSIS — E876 Hypokalemia: Secondary | ICD-10-CM | POA: Diagnosis present

## 2013-04-14 DIAGNOSIS — E46 Unspecified protein-calorie malnutrition: Secondary | ICD-10-CM | POA: Diagnosis present

## 2013-04-14 DIAGNOSIS — I5033 Acute on chronic diastolic (congestive) heart failure: Principal | ICD-10-CM | POA: Diagnosis present

## 2013-04-14 DIAGNOSIS — R609 Edema, unspecified: Secondary | ICD-10-CM

## 2013-04-14 DIAGNOSIS — I129 Hypertensive chronic kidney disease with stage 1 through stage 4 chronic kidney disease, or unspecified chronic kidney disease: Secondary | ICD-10-CM | POA: Diagnosis present

## 2013-04-14 DIAGNOSIS — E1169 Type 2 diabetes mellitus with other specified complication: Secondary | ICD-10-CM | POA: Diagnosis present

## 2013-04-14 DIAGNOSIS — Z7901 Long term (current) use of anticoagulants: Secondary | ICD-10-CM

## 2013-04-14 DIAGNOSIS — Z823 Family history of stroke: Secondary | ICD-10-CM

## 2013-04-14 DIAGNOSIS — I509 Heart failure, unspecified: Secondary | ICD-10-CM | POA: Diagnosis present

## 2013-04-14 DIAGNOSIS — Z833 Family history of diabetes mellitus: Secondary | ICD-10-CM

## 2013-04-14 DIAGNOSIS — I5031 Acute diastolic (congestive) heart failure: Secondary | ICD-10-CM

## 2013-04-14 DIAGNOSIS — D62 Acute posthemorrhagic anemia: Secondary | ICD-10-CM | POA: Diagnosis not present

## 2013-04-14 DIAGNOSIS — R600 Localized edema: Secondary | ICD-10-CM

## 2013-04-14 DIAGNOSIS — J189 Pneumonia, unspecified organism: Secondary | ICD-10-CM | POA: Diagnosis present

## 2013-04-14 DIAGNOSIS — E039 Hypothyroidism, unspecified: Secondary | ICD-10-CM | POA: Diagnosis present

## 2013-04-14 DIAGNOSIS — N058 Unspecified nephritic syndrome with other morphologic changes: Secondary | ICD-10-CM | POA: Diagnosis present

## 2013-04-14 DIAGNOSIS — J9620 Acute and chronic respiratory failure, unspecified whether with hypoxia or hypercapnia: Secondary | ICD-10-CM | POA: Diagnosis present

## 2013-04-14 DIAGNOSIS — Y9210 Unspecified residential institution as the place of occurrence of the external cause: Secondary | ICD-10-CM | POA: Diagnosis not present

## 2013-04-14 DIAGNOSIS — Z85820 Personal history of malignant melanoma of skin: Secondary | ICD-10-CM

## 2013-04-14 DIAGNOSIS — Y849 Medical procedure, unspecified as the cause of abnormal reaction of the patient, or of later complication, without mention of misadventure at the time of the procedure: Secondary | ICD-10-CM | POA: Diagnosis not present

## 2013-04-14 DIAGNOSIS — R578 Other shock: Secondary | ICD-10-CM | POA: Diagnosis not present

## 2013-04-14 DIAGNOSIS — E870 Hyperosmolality and hypernatremia: Secondary | ICD-10-CM | POA: Diagnosis present

## 2013-04-14 DIAGNOSIS — I279 Pulmonary heart disease, unspecified: Secondary | ICD-10-CM | POA: Diagnosis present

## 2013-04-14 DIAGNOSIS — S301XXA Contusion of abdominal wall, initial encounter: Secondary | ICD-10-CM

## 2013-04-14 DIAGNOSIS — I482 Chronic atrial fibrillation, unspecified: Secondary | ICD-10-CM | POA: Diagnosis present

## 2013-04-14 DIAGNOSIS — I4891 Unspecified atrial fibrillation: Secondary | ICD-10-CM | POA: Diagnosis present

## 2013-04-14 DIAGNOSIS — Z66 Do not resuscitate: Secondary | ICD-10-CM | POA: Diagnosis present

## 2013-04-14 HISTORY — DX: Type 2 diabetes mellitus without complications: E11.9

## 2013-04-14 HISTORY — DX: Hypothyroidism, unspecified: E03.9

## 2013-04-14 HISTORY — DX: Unspecified atrial fibrillation: I48.91

## 2013-04-14 LAB — CBC WITH DIFFERENTIAL/PLATELET
Basophils Absolute: 0.1 10*3/uL (ref 0.0–0.1)
Eosinophils Absolute: 0.2 10*3/uL (ref 0.0–0.7)
Hemoglobin: 13.1 g/dL (ref 12.0–15.0)
Lymphocytes Relative: 16 % (ref 12–46)
MCHC: 29.7 g/dL — ABNORMAL LOW (ref 30.0–36.0)
Neutrophils Relative %: 65 % (ref 43–77)
RDW: 17.3 % — ABNORMAL HIGH (ref 11.5–15.5)
WBC: 7.9 10*3/uL (ref 4.0–10.5)

## 2013-04-14 LAB — BASIC METABOLIC PANEL
Calcium: 9.1 mg/dL (ref 8.4–10.5)
Creatinine, Ser: 1.76 mg/dL — ABNORMAL HIGH (ref 0.50–1.10)
GFR calc Af Amer: 34 mL/min — ABNORMAL LOW (ref >90)
GFR calc non Af Amer: 30 mL/min — ABNORMAL LOW (ref >90)

## 2013-04-14 MED ORDER — LIVING BETTER WITH HEART FAILURE BOOK
Freq: Once | Status: AC
  Administered 2013-04-15: 10:00:00
  Filled 2013-04-14: qty 1

## 2013-04-14 MED ORDER — NYSTATIN 100000 UNIT/GM EX POWD
CUTANEOUS | Status: AC
  Filled 2013-04-14: qty 15

## 2013-04-14 MED ORDER — LEVALBUTEROL HCL 0.63 MG/3ML IN NEBU
INHALATION_SOLUTION | RESPIRATORY_TRACT | Status: AC
  Administered 2013-04-14: 0.63 mg
  Filled 2013-04-14: qty 3

## 2013-04-14 MED ORDER — ACETAMINOPHEN 325 MG PO TABS: 650.0000 mg | ORAL_TABLET | ORAL | Status: DC | PRN

## 2013-04-14 MED ORDER — INSULIN ASPART 100 UNIT/ML ~~LOC~~ SOLN: 0.0000 [IU] | Freq: Every day | SUBCUTANEOUS | Status: DC

## 2013-04-14 MED ORDER — BIMATOPROST 0.03 % OP SOLN
1.0000 [drp] | Freq: Every day | OPHTHALMIC | Status: DC
  Filled 2013-04-14: qty 2.5

## 2013-04-14 MED ORDER — INSULIN GLARGINE 100 UNIT/ML ~~LOC~~ SOLN
SUBCUTANEOUS | Status: AC
  Filled 2013-04-14: qty 10

## 2013-04-14 MED ORDER — BIOTENE DRY MOUTH MT LIQD
15.0000 mL | Freq: Two times a day (BID) | OROMUCOSAL | Status: DC
  Administered 2013-04-14 – 2013-04-16 (×5): 15 mL via OROMUCOSAL

## 2013-04-14 MED ORDER — POTASSIUM CHLORIDE CRYS ER 20 MEQ PO TBCR
20.0000 meq | EXTENDED_RELEASE_TABLET | Freq: Two times a day (BID) | ORAL | Status: DC
  Administered 2013-04-14: 20 meq via ORAL
  Filled 2013-04-14 (×2): qty 1

## 2013-04-14 MED ORDER — SODIUM CHLORIDE 0.9 % IJ SOLN: 3.0000 mL | INTRAMUSCULAR | Status: DC | PRN

## 2013-04-14 MED ORDER — FUROSEMIDE 40 MG PO TABS
80.0000 mg | ORAL_TABLET | Freq: Once | ORAL | Status: AC
  Administered 2013-04-14: 80 mg via ORAL
  Filled 2013-04-14: qty 2

## 2013-04-14 MED ORDER — INSULIN GLARGINE 100 UNIT/ML ~~LOC~~ SOLN
30.0000 [IU] | Freq: Every day | SUBCUTANEOUS | Status: DC
  Administered 2013-04-14 – 2013-04-16 (×3): 30 [IU] via SUBCUTANEOUS
  Filled 2013-04-14: qty 0.3

## 2013-04-14 MED ORDER — LEVOTHYROXINE SODIUM 100 MCG PO TABS
200.0000 ug | ORAL_TABLET | Freq: Every day | ORAL | Status: DC
  Administered 2013-04-15 – 2013-04-16 (×2): 200 ug via ORAL
  Filled 2013-04-14 (×2): qty 2

## 2013-04-14 MED ORDER — ASPIRIN EC 81 MG PO TBEC
81.0000 mg | DELAYED_RELEASE_TABLET | Freq: Every day | ORAL | Status: DC
  Administered 2013-04-14 – 2013-04-16 (×3): 81 mg via ORAL
  Filled 2013-04-14 (×3): qty 1

## 2013-04-14 MED ORDER — WARFARIN - PHARMACIST DOSING INPATIENT
Status: DC
  Administered 2013-04-15 – 2013-04-16 (×2)

## 2013-04-14 MED ORDER — NYSTATIN 100000 UNIT/GM EX POWD
Freq: Two times a day (BID) | CUTANEOUS | Status: DC
  Administered 2013-04-14 – 2013-04-29 (×30): via TOPICAL
  Filled 2013-04-14: qty 15

## 2013-04-14 MED ORDER — SODIUM CHLORIDE 0.9 % IJ SOLN
3.0000 mL | Freq: Two times a day (BID) | INTRAMUSCULAR | Status: DC
  Administered 2013-04-14 – 2013-04-16 (×5): 3 mL via INTRAVENOUS

## 2013-04-14 MED ORDER — INSULIN ASPART 100 UNIT/ML ~~LOC~~ SOLN
0.0000 [IU] | Freq: Three times a day (TID) | SUBCUTANEOUS | Status: DC
  Administered 2013-04-15 – 2013-04-16 (×2): 2 [IU] via SUBCUTANEOUS

## 2013-04-14 MED ORDER — WARFARIN SODIUM 2.5 MG PO TABS: 2.5000 mg | ORAL_TABLET | ORAL | Status: DC

## 2013-04-14 MED ORDER — WARFARIN - PHYSICIAN DOSING INPATIENT: Freq: Every day | Status: DC

## 2013-04-14 MED ORDER — SODIUM CHLORIDE 0.9 % IV SOLN: 250.0000 mL | INTRAVENOUS | Status: DC | PRN

## 2013-04-14 MED ORDER — ONDANSETRON HCL 4 MG/2ML IJ SOLN
4.0000 mg | Freq: Four times a day (QID) | INTRAMUSCULAR | Status: DC | PRN
  Administered 2013-04-18: 4 mg via INTRAVENOUS
  Filled 2013-04-14: qty 2

## 2013-04-14 MED ORDER — METOPROLOL SUCCINATE ER 50 MG PO TB24
200.0000 mg | ORAL_TABLET | Freq: Every day | ORAL | Status: DC
  Administered 2013-04-14: 200 mg via ORAL
  Filled 2013-04-14: qty 4

## 2013-04-14 MED ORDER — FUROSEMIDE 10 MG/ML IJ SOLN
80.0000 mg | Freq: Two times a day (BID) | INTRAMUSCULAR | Status: DC
  Administered 2013-04-14 – 2013-04-16 (×4): 80 mg via INTRAVENOUS
  Filled 2013-04-14 (×4): qty 8

## 2013-04-14 MED ORDER — WARFARIN SODIUM 2.5 MG PO TABS
2.5000 mg | ORAL_TABLET | Freq: Once | ORAL | Status: AC
  Administered 2013-04-14: 2.5 mg via ORAL
  Filled 2013-04-14: qty 1

## 2013-04-14 MED ORDER — WARFARIN - PHARMACIST DOSING INPATIENT: Freq: Every day | Status: DC

## 2013-04-14 MED ORDER — DILTIAZEM HCL ER BEADS 240 MG PO CP24
360.0000 mg | ORAL_CAPSULE | Freq: Every day | ORAL | Status: DC
  Administered 2013-04-14: 360 mg via ORAL
  Filled 2013-04-14 (×3): qty 1

## 2013-04-14 NOTE — ED Notes (Addendum)
Per EMS, pt woke up this am with SOB. Pt also reported generalized weakness, and not being able to walk without assistance. Pt alert and oriented. Per EMS, pt was 80 on room air, neb admin en route. Pt sat 99-96 post neb. CBG en route 109. Pt diaphoretic, taking shallow breaths upon arrival. Pt placed on 4L nasal cannula 92 O2 sat.  Pt denies being on oxygen at home, denies cp and n/v/d. Pt also reports right arm,right leg has increased weeping. Pt reports she was seen by PCP and told to get compression hose. Pt reports no improvement. Pt also reports a change from metformin to glipizide recently.

## 2013-04-14 NOTE — ED Provider Notes (Signed)
History    This chart was scribed for Amber Co, MD by Leone Payor, ED Scribe. This patient was seen in room APA10/APA10 and the patient's care was started 11:13 AM.   CSN: [id number]  Arrival date & time 04/14/13  1004   First MD Initiated Contact with Patient 04/14/13 1050      Chief Complaint  Patient presents with  . Shortness of Breath     The history is provided by the patient, a parent and a relative. No language interpreter was used.    HPI Comments: Amber Armstrong is a 62 y.o. female brought in by ambulance, who presents to the Emergency Department complaining of ongoing, constant SOB that increased last night. Per family, pt has worsened swelling to arms and legs in the last 3 weeks. States "the water is oozing from her skin." She takes warfarin for atrial fibrillation. She denies using O2 at home. She recently had a skin biopsy performed that came back positive for melanoma. She used to take 20 mg of lasix once daily but was increased to BID recently. Pt's O2 sat on room air is 78% currently. Per EMS, she was 80 on room air and was administered a nebulizer en route after which her O2 saturation was 96-99. She denies fever, chills, cough. Pt has h/o HTN, DM.    Past Medical History  Diagnosis Date  . Hypertension   . Diabetes mellitus     Type II  . Arrhythmia     Atrial fib  . Nephrolithiasis   . History of thyroidectomy   . Overweight(278.02)   . UTI (urinary tract infection)     Past Surgical History  Procedure Laterality Date  . Thyroidectomy      Family History  Problem Relation Age of Onset  . Arrhythmia Mother     Atrial fib    History  Substance Use Topics  . Smoking status: Never Smoker   . Smokeless tobacco: Never Used  . Alcohol Use: No    OB History   Grav Para Term Preterm Abortions TAB SAB Ect Mult Living                  Review of Systems A complete 10 system review of systems was obtained and all systems are negative except as  noted in the HPI and PMH.   Allergies  Penicillins  Home Medications   Current Outpatient Rx  Name  Route  Sig  Dispense  Refill  . bimatoprost (LUMIGAN) 0.03 % ophthalmic solution   Both Eyes   Place 1 drop into both eyes at bedtime.         . furosemide (LASIX) 20 MG tablet   Oral   Take 1 tablet (20 mg total) by mouth daily.   30 tablet   5   . insulin glargine (LANTUS) 100 UNIT/ML injection   Subcutaneous   Inject 70 Units into the skin at bedtime.           Marland Kitchen levothyroxine (SYNTHROID, LEVOTHROID) 175 MCG tablet   Oral   Take 175 mcg by mouth daily.           Marland Kitchen EXPIRED: lisinopril (PRINIVIL,ZESTRIL) 20 MG tablet   Oral   Take 1 tablet (20 mg total) by mouth daily.   90 tablet   1   . metFORMIN (GLUCOPHAGE) 500 MG tablet   Oral   Take 500 mg by mouth 2 (two) times daily.           Marland Kitchen  metoprolol (TOPROL-XL) 200 MG 24 hr tablet      TAKE ONE TABLET BY MOUTH EVERY DAY   30 tablet   0   . TAZTIA XT 360 MG 24 hr capsule      TAKE ONE CAPSULE BY MOUTH EVERY DAY   90 capsule   0   . warfarin (COUMADIN) 2.5 MG tablet      Take 2 tablets daily except 1 tablet on Wednesdays   60 tablet   3     BP 152/82  Pulse 64  Temp(Src) 98.6 F (37 C) (Oral)  Ht 5' (1.524 m)  Wt 225 lb (102.059 kg)  BMI 43.94 kg/m2  SpO2 91%  Physical Exam  Nursing note and vitals reviewed. Constitutional: She is oriented to person, place, and time. She appears well-developed and well-nourished.  HENT:  Head: Normocephalic.  Eyes: EOM are normal.  Neck: Normal range of motion.  Cardiovascular: Normal rate, regular rhythm and normal heart sounds.   Pulmonary/Chest: Effort normal. She has wheezes.  Bilateral wheezing, decreased breath sounds at bases.   Abdominal: She exhibits no distension.  Musculoskeletal: Normal range of motion. She exhibits edema.  2+ pitting edema to bilateral lower extremities.   Neurological: She is alert and oriented to person, place, and time.   Psychiatric: She has a normal mood and affect.    ED Course  Procedures (including critical care time)   Date: 04/14/2013  Rate: 59  Rhythm: rate controlled afib  QRS Axis: normal  Intervals: normal  ST/T Wave abnormalities: normal  Conduction Disutrbances: none  Narrative Interpretation:   Old EKG Reviewed: No significant changes noted     DIAGNOSTIC STUDIES: Oxygen Saturation is 91% on 3 L, low by my interpretation.  (78% on RA, low)  COORDINATION OF CARE: 11:13 AM Discussed treatment plan with pt at bedside and pt agreed to plan.    Labs Reviewed  CBC WITH DIFFERENTIAL - Abnormal; Notable for the following:    RBC 5.23 (*)    MCH 25.0 (*)    MCHC 29.7 (*)    RDW 17.3 (*)    Monocytes Relative 15 (*)    Monocytes Absolute 1.2 (*)    All other components within normal limits  BASIC METABOLIC PANEL - Abnormal; Notable for the following:    CO2 35 (*)    BUN 38 (*)    Creatinine, Ser 1.76 (*)    GFR calc non Af Amer 30 (*)    GFR calc Af Amer 34 (*)    All other components within normal limits  PRO B NATRIURETIC PEPTIDE - Abnormal; Notable for the following:    Pro B Natriuretic peptide (BNP) 12159.0 (*)    All other components within normal limits  PROTIME-INR - Abnormal; Notable for the following:    Prothrombin Time 24.7 (*)    INR 2.35 (*)    All other components within normal limits  TROPONIN I   Dg Chest Portable 1 View  04/14/2013   *RADIOLOGY REPORT*  Clinical Data: Short of breath  PORTABLE CHEST - 1 VIEW  Comparison: None.  Findings: Cardiac silhouette is enlarged.  There is central venous pulmonary congestion.  The lung bases are poorly evaluated due to technique and body habitus.  Likely bilateral effusions.  IMPRESSION: Cardiomegaly, central venous congestion. and  bilateral effusions consistent congestive heart failure.   Original Report Authenticated By: Genevive Bi, M.D.   I personally reviewed the imaging tests through PACS system I  reviewed available ER/hospitalization  records through the EMR   1. CHF exacerbation       MDM  This appears to be congestive heart failure exacerbation.  The patient will need to be admitted for ongoing diuresis.  Lasix given in the emergency department.      I personally performed the services described in this documentation, which was scribed in my presence. The recorded information has been reviewed and is accurate.      Amber Co, MD 04/14/13 1151

## 2013-04-14 NOTE — Progress Notes (Signed)
Unable to provide patient with heart failure book at this time not available.

## 2013-04-14 NOTE — Progress Notes (Signed)
ANTICOAGULATION CONSULT NOTE - Initial Consult  Pharmacy Consult for Coumadin Indication: atrial fibrillation  Allergies  Allergen Reactions  . Penicillins Rash    Patient Measurements: Height: 5' (152.4 cm) Weight: 233 lb 4 oz (105.8 kg) IBW/kg (Calculated) : 45.5  Vital Signs: Temp: 97.4 F (36.3 C) (06/22 1554) Temp src: Oral (06/22 1554) BP: 160/80 mmHg (06/22 1554) Pulse Rate: 61 (06/22 1554)  Labs:  Recent Labs  04/14/13 1030  HGB 13.1  HCT 44.1  PLT 293  LABPROT 24.7*  INR 2.35*  CREATININE 1.76*  TROPONINI <0.30    Estimated Creatinine Clearance: 35.9 ml/min (by C-G formula based on Cr of 1.76).   Medical History: Past Medical History  Diagnosis Date  . Hypertension   . Diabetes mellitus     Type II  . Arrhythmia     Atrial fib  . Nephrolithiasis   . History of thyroidectomy   . Overweight(278.02)   . UTI (urinary tract infection)     Medications:  Scheduled:  . aspirin EC  81 mg Oral Daily  . bimatoprost  1 drop Both Eyes QHS  . diltiazem  360 mg Oral Daily  . furosemide  80 mg Intravenous Q12H  . [START ON 04/15/2013] insulin aspart  0-15 Units Subcutaneous TID WC  . insulin aspart  0-5 Units Subcutaneous QHS  . insulin glargine  30 Units Subcutaneous QHS  . [START ON 04/15/2013] levothyroxine  200 mcg Oral QAC breakfast  . [START ON 04/15/2013] Living Better with Heart Failure Book   Does not apply Once  . metoprolol  200 mg Oral Daily  . nystatin   Topical BID  . potassium chloride  20 mEq Oral BID  . sodium chloride  3 mL Intravenous Q12H  . [START ON 04/15/2013] Warfarin - Pharmacist Dosing Inpatient   Does not apply Q24H    Assessment: 62 yo F on chronic warfarin 2.5mg  daily except 5mg  on Wed for Afib.  INR therapeutic on admission. No bleeding noted.   Goal of Therapy:  INR 2-3   Plan:  Coumadin 2.5mg  po x1  INR daily  Elson Clan 04/14/2013,6:50 PM

## 2013-04-14 NOTE — Progress Notes (Signed)
Patient running bradycardic on telemetry monitor 38-40's.  Patient is alert and oriented at this time with no complaints of dizziness, nausea, or pain.  STAT EKG reported to on-call MD with discontinuation of a blood pressure medication and continue to monitor.

## 2013-04-14 NOTE — H&P (Signed)
[facility] History and Physical  Amber Armstrong ZOX:[id number] DOB: 1951-07-13 DOA: 04/14/2013  Referring physician: Dr. Patria Mane, ER physician PCP: Pershing Proud  Specialists: [facility]  Chief Complaint: Shortness of breath  HPI: Amber Armstrong is a 63 y.o. female who presents to the emergency room with diffuse edema and shortness of breath. Patient reports that her generalized edema started a few weeks ago. This has progressively gotten worse. She denies any chest pain. She has noted increasingly short of breath. She describes PND and orthopnea. She's noticing steady increase in her weight. Her edema has gotten to the point that her arms/legs are weeping. She reports compliant with salt and fluid intake. She was taking 20 mg of Lasix daily. This was increased to 20 mg twice a day approximately 3 days ago by her primary care physician. She reports compliance with her medications. She denies any fever, cough, vomiting or diarrhea. She was evaluated in the emergency room and noted to be in congestive heart failure. She was hypoxic on room air requiring supplemental oxygen. She is admitted to the hospital further treatments.  Review of Systems: Pertinent positives as per history of present illness, otherwise negative  Past Medical History  Diagnosis Date  . Hypertension   . Diabetes mellitus     Type II  . Arrhythmia     Atrial fib  . Nephrolithiasis   . History of thyroidectomy   . Overweight(278.02)   . UTI (urinary tract infection)    Past Surgical History  Procedure Laterality Date  . Thyroidectomy     Social History:  reports that she has never smoked. She has never used smokeless tobacco. She reports that she does not drink alcohol or use illicit drugs. Lives with her mother  Allergies  Allergen Reactions  . Penicillins Rash    Family History  Problem Relation Age of Onset  . Arrhythmia Mother     Atrial fib   father died of stroke, sister has  diabetes  Prior to Admission medications   Medication Sig Start Date End Date Taking? Authorizing Provider  furosemide (LASIX) 20 MG tablet Take 20 mg by mouth 2 (two) times daily.   Yes Historical Provider, MD  glimepiride (AMARYL) 2 MG tablet Take 2 mg by mouth daily before breakfast.   Yes Historical Provider, MD  insulin glargine (LANTUS) 100 UNIT/ML injection Inject 30 Units into the skin at bedtime.    Yes Historical Provider, MD  levothyroxine (SYNTHROID, LEVOTHROID) 200 MCG tablet Take 200 mcg by mouth daily before breakfast.   Yes Historical Provider, MD  metoprolol (TOPROL-XL) 200 MG 24 hr tablet TAKE ONE TABLET BY MOUTH EVERY DAY 04/02/13  Yes Gaylord Shih, MD  TAZTIA XT 360 MG 24 hr capsule TAKE ONE CAPSULE BY MOUTH EVERY DAY 04/02/13  Yes Gaylord Shih, MD  warfarin (COUMADIN) 2.5 MG tablet Take 2.5-5 mg by mouth See admin instructions. Take 1 tablet daily except for Wednesday pt takes 2 tablets   Yes Historical Provider, MD  bimatoprost (LUMIGAN) 0.03 % ophthalmic solution Place 1 drop into both eyes at bedtime.    Historical Provider, MD   Physical Exam: Filed Vitals:   04/14/13 1302 04/14/13 1400 04/14/13 1500 04/14/13 1554  BP: 139/83 132/84 143/79 160/80  Pulse:  50 59 61  Temp:    97.4 F (36.3 C)  TempSrc:    Oral  Resp:  20 18   Height:    5' (1.524 m)  Weight:    105.8  kg (233 lb 4 oz)  SpO2:  95% 96% 97%     General:  Mildly increased work of breathing otherwise no acute distress  Eyes: Pupils are equal round reactive to light  ENT: Mucous membranes are moist  Neck: Supple  Cardiovascular: S1, S2, irregular  Respiratory: Bilateral crackles and wheezes  Abdomen: Distended, soft, positive bowel sounds, nontender  Skin: coin sized lesion on anterior abdomen  Musculoskeletal: 1-2+ edema bilaterally  Psychiatric: Normal affect, cooperative with exam  Neurologic: Grossly intact, nonfocal  Labs on Admission:  Basic Metabolic Panel:  Recent Labs Lab  04/14/13 1030  NA 144  K 4.7  CL 102  CO2 35*  GLUCOSE 91  BUN 38*  CREATININE 1.76*  CALCIUM 9.1   Liver Function Tests: No results found for this basename: AST, ALT, ALKPHOS, BILITOT, PROT, ALBUMIN,  in the last 168 hours No results found for this basename: LIPASE, AMYLASE,  in the last 168 hours No results found for this basename: AMMONIA,  in the last 168 hours CBC:  Recent Labs Lab 04/14/13 1030  WBC 7.9  NEUTROABS 5.1  HGB 13.1  HCT 44.1  MCV 84.3  PLT 293   Cardiac Enzymes:  Recent Labs Lab 04/14/13 1030  TROPONINI <0.30    BNP (last 3 results)  Recent Labs  04/14/13 1030  PROBNP 12159.0*   CBG: No results found for this basename: GLUCAP,  in the last 168 hours  Radiological Exams on Admission: Dg Chest Portable 1 View  04/14/2013   *RADIOLOGY REPORT*  Clinical Data: Short of breath  PORTABLE CHEST - 1 VIEW  Comparison: None.  Findings: Cardiac silhouette is enlarged.  There is central venous pulmonary congestion.  The lung bases are poorly evaluated due to technique and body habitus.  Likely bilateral effusions.  IMPRESSION: Cardiomegaly, central venous congestion. and  bilateral effusions consistent congestive heart failure.   Original Report Authenticated By: Genevive Bi, M.D.    EKG: Independently reviewed. Atrial fibrillation with a slow ventricular response. No acute changes  Assessment/Plan Active Problems:   DIABETES MELLITUS, TYPE II   HYPERLIPIDEMIA   Morbid obesity with BMI of 40.0-44.9, adult   HYPERTENSION   FIBRILLATION, ATRIAL   Long term current use of anticoagulant   CHF exacerbation   Acute respiratory failure   1. Acute exacerbation of CHF. Last echo in system is from 2009. At that time, she had a normal ejection fraction with possible RV dysfunction. We will repeat echo during this admission. She'll be started on aggressive IV diuresis. We will monitor daily weights and strict ins and outs. Continue beta blockers. She's  not a candidate for ACE inhibitors due to elevated creatinine at this time. Continue aspirin. Check TSH and cardiac markers. Monitor on telemetry. We will request a cardiology consultation for assistance in further management. 2. Acute respiratory failure secondary to #1. Wean down oxygen as tolerated 3. Acute renal failure. Possibly due to decreased cardiac output. Will give Lasix and monitor urine outputs/serum creatinine. 4. Atrial fibrillation. Currently rate controlled. She is anticoagulated with Coumadin. 5. Diabetes. We'll use sliding scale insulin while in the hospital.    Code Status: Full Family Communication: Discussed with patient and family at the bedside Disposition Plan: Discharge home once improved  Time spent:  MEMON,JEHANZEB [facility] Pager [phone]  If 7PM-7AM, please contact night-coverage [email or web address] Password [facility] 04/14/2013, 5:54 PM

## 2013-04-15 ENCOUNTER — Encounter (HOSPITAL_COMMUNITY): Payer: Self-pay | Admitting: Cardiology

## 2013-04-15 DIAGNOSIS — I369 Nonrheumatic tricuspid valve disorder, unspecified: Secondary | ICD-10-CM

## 2013-04-15 DIAGNOSIS — N179 Acute kidney failure, unspecified: Secondary | ICD-10-CM

## 2013-04-15 DIAGNOSIS — Z6841 Body Mass Index (BMI) 40.0 and over, adult: Secondary | ICD-10-CM

## 2013-04-15 DIAGNOSIS — Z7901 Long term (current) use of anticoagulants: Secondary | ICD-10-CM

## 2013-04-15 DIAGNOSIS — I5031 Acute diastolic (congestive) heart failure: Secondary | ICD-10-CM

## 2013-04-15 LAB — PROTIME-INR
INR: 2.32 — ABNORMAL HIGH (ref 0.00–1.49)
Prothrombin Time: 24.4 seconds — ABNORMAL HIGH (ref 11.6–15.2)

## 2013-04-15 LAB — BASIC METABOLIC PANEL
BUN: 41 mg/dL — ABNORMAL HIGH (ref 6–23)
CO2: 35 mEq/L — ABNORMAL HIGH (ref 19–32)
Calcium: 9 mg/dL (ref 8.4–10.5)
Creatinine, Ser: 1.91 mg/dL — ABNORMAL HIGH (ref 0.50–1.10)
Glucose, Bld: 114 mg/dL — ABNORMAL HIGH (ref 70–99)

## 2013-04-15 LAB — GLUCOSE, CAPILLARY
Glucose-Capillary: 133 mg/dL — ABNORMAL HIGH (ref 70–99)
Glucose-Capillary: 183 mg/dL — ABNORMAL HIGH (ref 70–99)

## 2013-04-15 LAB — LACTATE DEHYDROGENASE: LDH: 234 U/L (ref 94–250)

## 2013-04-15 MED ORDER — LEVALBUTEROL HCL 0.63 MG/3ML IN NEBU
0.6300 mg | INHALATION_SOLUTION | Freq: Four times a day (QID) | RESPIRATORY_TRACT | Status: DC | PRN
  Administered 2013-04-15 – 2013-04-20 (×3): 0.63 mg via RESPIRATORY_TRACT
  Filled 2013-04-15 (×3): qty 3

## 2013-04-15 MED ORDER — METOPROLOL SUCCINATE ER 50 MG PO TB24
100.0000 mg | ORAL_TABLET | Freq: Every day | ORAL | Status: DC
  Administered 2013-04-16: 100 mg via ORAL
  Filled 2013-04-15: qty 2

## 2013-04-15 MED ORDER — WARFARIN SODIUM 2.5 MG PO TABS
2.5000 mg | ORAL_TABLET | Freq: Once | ORAL | Status: AC
  Administered 2013-04-15: 2.5 mg via ORAL
  Filled 2013-04-15: qty 1

## 2013-04-15 MED ORDER — INSULIN GLARGINE 100 UNIT/ML ~~LOC~~ SOLN
SUBCUTANEOUS | Status: AC
  Filled 2013-04-15: qty 10

## 2013-04-15 MED ORDER — BIMATOPROST 0.01 % OP SOLN
1.0000 [drp] | Freq: Every day | OPHTHALMIC | Status: DC
  Administered 2013-04-15 – 2013-04-28 (×13): 1 [drp] via OPHTHALMIC
  Filled 2013-04-15: qty 2.5

## 2013-04-15 NOTE — Progress Notes (Signed)
Patient's Potassium level 5.0,Dr Gosrani notified.  Orders received and given. Will continue to monitor patient.

## 2013-04-15 NOTE — Care Management Note (Signed)
    Page 1 of 2   04/29/2013     9:59:58 AM   CARE MANAGEMENT NOTE 04/29/2013  Patient:  Amber Armstrong, Amber Armstrong   Account Number:  [id number]  Date Initiated:  04/15/2013  Documentation initiated by:  Rosemary Holms  Subjective/Objective Assessment:   Pt admitted from home where she lives with her mom. CHF program suggested to pt who is agreeable and selected [facility].     Action/Plan:   Anticipated DC Date:  04/27/2013   Anticipated DC Plan:  SKILLED NURSING FACILITY  In-house referral  Clinical Social Worker      DC Planning Services  CM consult      Choice offered to / List presented to:             Status of service:  Completed, signed off Medicare Important Message given?  YES (If response is "NO", the following Medicare IM given date fields will be blank) Date Medicare IM given:  04/25/2013 Date Additional Medicare IM given:    Discharge Disposition:    Per UR Regulation:  Reviewed for med. necessity/level of care/duration of stay  If discussed at Long Length of Stay Meetings, dates discussed:   04/23/2013  04/25/2013    Comments:  04/29/13 Amber Oldenkamp RN BSN CM DC to [facility]  04/23/13 Amber Winfield RN BSN CM Pt evaluated by PT and SNF recommended and pt is receptive.  04/22/13 1600 Amber Schwarzkopf Leanord Hawking RN BSN CM Pt evaluated by Amber Armstrong. [facility] accepted pt but pt does not want to be so far from her mother. MD notified and we will redress tomorrow. Remains on vent.  04/19/13 1400 Amber Lacroix RN BSN CM CM spoke to pt, sister and mother at bedside. Notified them that a representative from [facility] had called to notify of pt's benefits. They include LTAC, SNF and HH. Representative is Amber Armstrong [phone] [id number]. Hoping to wean off vent tomorrow.  04/17/13 1000 Amber Henderson RN/CM pt transferred to ICU, intubated and on a vent, due to resp. failure 04/15/13 Amber Minerd Leanord Hawking RN BSN CM

## 2013-04-15 NOTE — Consult Note (Signed)
CARDIOLOGY CONSULT NOTE  Patient ID: Amber Armstrong MRN: [id number] DOB/AGE: 10/26/1950 62 y.o.  Admit date: 04/14/2013 Referring Physician: PTH-Gosrani MD Primary PhysicianJACKSON,SAMANTHA, PA-C Primary Cardiologist: Valera Castle MD Reason for Consultation: CHF  HPI: [name] is a 62 y/o female with history of hypertension, atrial fibrillation on coumadin, diabetes, right sided ventricular dysfunction admitted with shortness of breath and anasarca. She states symptoms began one month ago with increased edema. Called our office and was told to increase her lasix to 20 mg BID. Unfortunately edema worsened to the point of weeping. Saw PCP last week with blood work. On Saturday she had extreme dyspnea prompting ER visit.     In ER O2 sat 83%.with BP of 152/82, Pro BNP of 12,159. Troponin negative. Creatinine of 1.76. CXR demonstrated central venous congestion, bilateral effusions, and CHF. She has been on IV diureses,lasix 80 mg IV BID,with wt gain per documentation and very little urine output. Due to bradycardia, metoprolol dose has been decreased.     Of note, she has had recent teeth extractions this month.   She was last seen by Dr. Daleen Squibb in February of this year.     Review of systems complete and found to be negative unless listed above   Past Medical History  Diagnosis Date  . Essential hypertension, benign   . Type 2 diabetes mellitus     Type II  . Atrial fibrillation   . Nephrolithiasis   . Overweight(278.02)   . UTI (urinary tract infection)   . Hypothyroidism (acquired)     Family History  Problem Relation Age of Onset  . Arrhythmia Mother     Atrial fib    History   Social History  . Marital Status: Divorced    Spouse Name: N/A    Number of Children: 0  . Years of Education: N/A   Occupational History  . Employed     [employer]   Social History Main Topics  . Smoking status: Never Smoker   . Smokeless tobacco: Never Used  . Alcohol Use: No  . Drug Use:  No  . Sexually Active: Not on file   Other Topics Concern  . Not on file   Social History Narrative   Full time pt works at [employer]. Widowed. No regular exercise.    Past Surgical History  Procedure Laterality Date  . Thyroidectomy       Prescriptions prior to admission  Medication Sig Dispense Refill  . furosemide (LASIX) 20 MG tablet Take 20 mg by mouth 2 (two) times daily.      Marland Kitchen glimepiride (AMARYL) 2 MG tablet Take 2 mg by mouth daily before breakfast.      . insulin glargine (LANTUS) 100 UNIT/ML injection Inject 30 Units into the skin at bedtime.       Marland Kitchen levothyroxine (SYNTHROID, LEVOTHROID) 200 MCG tablet Take 200 mcg by mouth daily before breakfast.      . metoprolol (TOPROL-XL) 200 MG 24 hr tablet TAKE ONE TABLET BY MOUTH EVERY DAY  30 tablet  0  . TAZTIA XT 360 MG 24 hr capsule TAKE ONE CAPSULE BY MOUTH EVERY DAY  90 capsule  0  . warfarin (COUMADIN) 2.5 MG tablet Take 2.5-5 mg by mouth See admin instructions. Take 1 tablet daily except for Wednesday pt takes 2 tablets      . bimatoprost (LUMIGAN) 0.03 % ophthalmic solution Place 1 drop into both eyes at bedtime.       Echocardiogram 2009  SUMMARY - Overall left ventricular systolic function was vigorous. There were no left ventricular regional wall motion abnormalities. Left ventricular wall thickness was at the upper limits of normal. There was mild flattening of the interventricular septum. There was mild dyssynergic motion of the interventricular septum. - The left atrium was mild to moderately dilated. - The right ventricle was moderately dilated. Right ventricular systolic function was hyperdynamic. There was mild right ventricular hypertrophy. The estimated peak right ventricular systolic pressure was mild to moderately increased. - There was moderate tricuspid valvular regurgitation. - The right atrium was moderately dilated. - The inferior vena cava was mildly dilated.   Physical Exam: Blood  pressure 121/72, pulse 60, temperature 97.4 F (36.3 C), temperature source Oral, resp. rate 18, height 5' (1.524 m), weight 236 lb 1.8 oz (107.1 kg), SpO2 91.00%.  General: Obese in no acute distress Head:  Eyes PERRLA, No xanthomas.   Normal cephalic and atramatic  Lungs:  Diminished breath sounds in the bases, with inspiratory and expiratory wheezes without coughing.  Heart:  HRIR S1 S2, bradycardic without MRG.  Pulses are 2+ & equal.            No carotid bruit. No JVD. Abdomen: Bowel sounds are positive, abdomen distended,and non-tender. Msk:  Back normal, normal gait. Normal strength and tone for age. Extremities: No clubbing, cyanosis, 2+edema. No weeping noted  DP diminished. Neuro: Alert and oriented X 3. Psych:  Good affect, responds appropriately  Labs:   Lab Results  Component Value Date   WBC 7.9 04/14/2013   HGB 13.1 04/14/2013   HCT 44.1 04/14/2013   MCV 84.3 04/14/2013   PLT 293 04/14/2013     Recent Labs Lab 04/15/13 0458  NA 142  K 5.0  CL 101  CO2 35*  BUN 41*  CREATININE 1.91*  CALCIUM 9.0  GLUCOSE 114*   Lab Results  Component Value Date   TROPONINI <0.30 04/15/2013      BNP (last 3 results)  Recent Labs  04/14/13 1030  PROBNP 12159.0*    Radiology: Dg Chest Portable 1 View  04/14/2013   *RADIOLOGY REPORT*  Clinical Data: Short of breath  PORTABLE CHEST - 1 VIEW  Comparison: None.  Findings: Cardiac silhouette is enlarged.  There is central venous pulmonary congestion.  The lung bases are poorly evaluated due to technique and body habitus.  Likely bilateral effusions.  IMPRESSION: Cardiomegaly, central venous congestion. and  bilateral effusions consistent congestive heart failure.   Original Report Authenticated By: Genevive Bi, M.D.   EKG: Atrial fibrillation with slow ventricular response, Right axis deviation, Possible Right ventricular hypertrophy, Nonspecific T wave abnormality, Low voltage.  ASSESSMENT AND PLAN:   1. Presumably  acute diastolic CHF with anasarca: Currently on IV diuretics with 350cc out more than in last 12 hours. Echo has been ordered to re-evaluate LV and RV function. Albumin 3.6. She denies dietary noncompliance with salt.  2. Atrial fibrillation: She is currently in atrial fib with slow ventricular response. Metoprolol has been decreased per hospitalist. Will check TSH in the setting of thyroidectomy and thyroid replacement therapy. Continue coumadin.  3. Acute renal insufficiency: Creatinine elevated on admission at 1.76 with worsening level this morning. Review of labs earlier this month and in the past has her at 1.09.   4. Diabetes: Glucose stable Continues on insulin per PCP.    Signed: Bettey Mare. Lyman Bishop NP [facility] 04/15/2013, 11:30 AM Co-Sign MD  Attending note:  Patient seen and examined.  Modified above note by Ms. Lawrence NP. Ms. Beckstrom presents with essentially one-month history of increasing shortness of breath and also generalized edema. She reports compliance with her medications including Lasix 20 mg twice daily. No reported change in fluid intake her sodium intake. Objectively she does have evidence of volume overload on examination, also based on pro-BNP and chest x-ray. Her last echocardiogram in 2009 demonstrated vigorous LVEF, flattening of the ventricular septum with moderate RV dilatation but vigorous function, moderate tricuspid regurgitation, moderately elevated PASP.  Plan at this time is IV diuresis, followup echocardiogram to reassess LV and RV function. Atrial fibrillation persists, but has not been rapid, in fact bradycardic necessitating reduction in beta blocker dose. She is on Coumadin, presently with therapeutic INR. Acute renal insufficiency is noted, will need to be followed closely in the face of active diuresis. Patient states that her urinary output has been normal in general, do not necessarily suspect any bladder outlet obstruction.  Jonelle Sidle, M.D., F.A.C.C.

## 2013-04-15 NOTE — Progress Notes (Signed)
*  PRELIMINARY RESULTS* Echocardiogram 2D Echocardiogram has been performed.  Conrad [place] 04/15/2013, 10:46 AM

## 2013-04-15 NOTE — Progress Notes (Signed)
ANTICOAGULATION CONSULT NOTE   Pharmacy Consult for Coumadin Indication: atrial fibrillation  Allergies  Allergen Reactions  . Penicillins Rash   Patient Measurements: Height: 5' (152.4 cm) Weight: 236 lb 1.8 oz (107.1 kg) IBW/kg (Calculated) : 45.5  Vital Signs: Temp: 97.4 F (36.3 C) (06/23 0505) Temp src: Oral (06/23 0505) BP: 121/72 mmHg (06/23 0505) Pulse Rate: 60 (06/23 0505)  Labs:  Recent Labs  04/14/13 1030 04/14/13 1847 04/14/13 2353 04/15/13 0458  HGB 13.1  --   --   --   HCT 44.1  --   --   --   PLT 293  --   --   --   LABPROT 24.7*  --   --  24.4*  INR 2.35*  --   --  2.32*  CREATININE 1.76*  --   --  1.91*  TROPONINI <0.30 <0.30 <0.30 <0.30    Estimated Creatinine Clearance: 33.4 ml/min (by C-G formula based on Cr of 1.91).   Medical History: Past Medical History  Diagnosis Date  . Essential hypertension, benign   . Type 2 diabetes mellitus     Type II  . Atrial fibrillation   . Nephrolithiasis   . Overweight(278.02)   . UTI (urinary tract infection)   . Hypothyroidism (acquired)     Medications:  Scheduled:  . antiseptic oral rinse  15 mL Mouth Rinse BID  . aspirin EC  81 mg Oral Daily  . bimatoprost  1 drop Both Eyes QHS  . furosemide  80 mg Intravenous Q12H  . insulin aspart  0-15 Units Subcutaneous TID WC  . insulin aspart  0-5 Units Subcutaneous QHS  . insulin glargine  30 Units Subcutaneous QHS  . levothyroxine  200 mcg Oral QAC breakfast  . [START ON 04/16/2013] metoprolol  100 mg Oral Daily  . nystatin   Topical BID  . sodium chloride  3 mL Intravenous Q12H  . Warfarin - Pharmacist Dosing Inpatient   Does not apply Q24H   Assessment: 62 yo F on chronic warfarin 2.5mg  daily except 5mg  on Wed for Afib. INR therapeutic on admission. No bleeding noted.  Pt is also on Aspirin.  Goal of Therapy:  INR 2-3   Plan:  Coumadin 2.5mg  po x1  INR daily  Valrie Hart A 04/15/2013,11:41 AM

## 2013-04-15 NOTE — Progress Notes (Signed)
  Amber Armstrong ZOX:[id number] DOB: January 19, 1951 DOA: 04/14/2013 PCP: Pershing Proud   Subjective: This lady was admitted with congestive heart failure. She still feels somewhat short of breath and is clearly edematous.           Physical Exam: Blood pressure 121/72, pulse 60, temperature 97.4 F (36.3 C), temperature source Oral, resp. rate 18, height 5' (1.524 m), weight 107.1 kg (236 lb 1.8 oz), SpO2 91.00%. Obese. Peripheral pitting edema in her legs. Lung fields show bilateral wheezing. Heart sounds are present and irregular with ventricular rate that's somewhat slower. She is alert and orientated.   Investigations:     Basic Metabolic Panel:  Recent Labs  81/19/14 1030 04/15/13 0458  NA 144 142  K 4.7 5.0  CL 102 101  CO2 35* 35*  GLUCOSE 91 114*  BUN 38* 41*  CREATININE 1.76* 1.91*  CALCIUM 9.1 9.0       CBC:  Recent Labs  04/14/13 1030  WBC 7.9  NEUTROABS 5.1  HGB 13.1  HCT 44.1  MCV 84.3  PLT 293    Dg Chest Portable 1 View  04/14/2013   *RADIOLOGY REPORT*  Clinical Data: Short of breath  PORTABLE CHEST - 1 VIEW  Comparison: None.  Findings: Cardiac silhouette is enlarged.  There is central venous pulmonary congestion.  The lung bases are poorly evaluated due to technique and body habitus.  Likely bilateral effusions.  IMPRESSION: Cardiomegaly, central venous congestion. and  bilateral effusions consistent congestive heart failure.   Original Report Authenticated By: Genevive Bi, M.D.      Medications: I have reviewed the patient's current medications.  Impression: 1. Acute Congestive heart failure. 2. Atrial fibrillation with slow ventricular response. 3. Diabetes mellitus type 2. 4. Acute renal failure in the setting of #1. 5. Chronic anticoagulation. 6. Obesity. 7. Hypertension.     Plan: 1. Continue with intravenous diuretics. 2. Reduce metoprolol. 3. Await echocardiogram report. 2. Appreciate cardiology  input.  Consultants:  Cardiology, Dr. Diona Browner.   Procedures:  None.   Antibiotics:  None.                   Code Status: Full code.  Family Communication: Discussed plan with patient at bedside.   Disposition Plan: Home when medically stable.  Time spent: 20 minutes.   LOS: 1 day   Wilson Singer Pager [phone]  04/15/2013, 10:46 AM

## 2013-04-15 NOTE — Progress Notes (Signed)
Utilization Review Complete  

## 2013-04-16 ENCOUNTER — Encounter (HOSPITAL_COMMUNITY): Payer: Self-pay | Admitting: Cardiology

## 2013-04-16 LAB — BASIC METABOLIC PANEL
CO2: 34 mEq/L — ABNORMAL HIGH (ref 19–32)
Calcium: 9.6 mg/dL (ref 8.4–10.5)
GFR calc non Af Amer: 26 mL/min — ABNORMAL LOW (ref >90)
Glucose, Bld: 149 mg/dL — ABNORMAL HIGH (ref 70–99)
Potassium: 5.1 mEq/L (ref 3.5–5.1)
Sodium: 142 mEq/L (ref 135–145)

## 2013-04-16 LAB — GLUCOSE, CAPILLARY: Glucose-Capillary: 97 mg/dL (ref 70–99)

## 2013-04-16 LAB — PROTIME-INR: INR: 2.37 — ABNORMAL HIGH (ref 0.00–1.49)

## 2013-04-16 MED ORDER — INSULIN GLARGINE 100 UNIT/ML ~~LOC~~ SOLN
SUBCUTANEOUS | Status: AC
  Filled 2013-04-16: qty 10

## 2013-04-16 MED ORDER — FUROSEMIDE 10 MG/ML IJ SOLN: 80.0000 mg | Freq: Four times a day (QID) | INTRAMUSCULAR | Status: DC

## 2013-04-16 MED ORDER — DEXTROSE 5 % IV SOLN
10.0000 mg/h | INTRAVENOUS | Status: DC
  Administered 2013-04-16: 10 mg/h via INTRAVENOUS
  Filled 2013-04-16 (×2): qty 25

## 2013-04-16 MED ORDER — WARFARIN SODIUM 2.5 MG PO TABS
2.5000 mg | ORAL_TABLET | Freq: Once | ORAL | Status: AC
  Administered 2013-04-16: 2.5 mg via ORAL
  Filled 2013-04-16: qty 1

## 2013-04-16 MED ORDER — METOLAZONE 5 MG PO TABS
2.5000 mg | ORAL_TABLET | Freq: Once | ORAL | Status: AC
  Administered 2013-04-16: 2.5 mg via ORAL
  Filled 2013-04-16: qty 1

## 2013-04-16 NOTE — Progress Notes (Signed)
Amber Armstrong JYN:[id number] DOB: 1951/01/14 DOA: 04/14/2013 PCP: Pershing Proud   Subjective: This lady was admitted with congestive heart failure. She still feels somewhat short of breath and is clearly edematous. She has not had a good diuresis in the last 24 hours. She has not lost any weight.           Physical Exam: Blood pressure 135/74, pulse 75, temperature 98.1 F (36.7 C), temperature source Oral, resp. rate 18, height 5' (1.524 m), weight 108.274 kg (238 lb 11.2 oz), SpO2 95.00%. Obese. Peripheral pitting edema in her legs. Lung fields show bilateral wheezing. Heart sounds are present and irregular with ventricular rate that's somewhat slower. She is alert and orientated.   Investigations:     Basic Metabolic Panel:  Recent Labs  86/57/84 0458 04/16/13 0526  NA 142 142  K 5.0 5.1  CL 101 100  CO2 35* 34*  GLUCOSE 114* 149*  BUN 41* 44*  CREATININE 1.91* 1.97*  CALCIUM 9.0 9.6       CBC:  Recent Labs  04/14/13 1030  WBC 7.9  NEUTROABS 5.1  HGB 13.1  HCT 44.1  MCV 84.3  PLT 293    Dg Chest Portable 1 View  04/14/2013   *RADIOLOGY REPORT*  Clinical Data: Short of breath  PORTABLE CHEST - 1 VIEW  Comparison: None.  Findings: Cardiac silhouette is enlarged.  There is central venous pulmonary congestion.  The lung bases are poorly evaluated due to technique and body habitus.  Likely bilateral effusions.  IMPRESSION: Cardiomegaly, central venous congestion. and  bilateral effusions consistent congestive heart failure.   Original Report Authenticated By: Genevive Bi, M.D.      Medications: I have reviewed the patient's current medications.  Impression: 1. Acute Congestive right heart failure with reduced right ventricular systolic function, moderate tricuspid regurgitation. 2. Atrial fibrillation with slow ventricular response. 3. Diabetes mellitus type 2. 4. Acute renal failure in the setting of #1. 5. Chronic anticoagulation. 6.  Obesity. 7. Hypertension.     Plan: 1. Increase intravenous Lasix to 80 mg every 6 hours. 2. Consider the use of metolazone in this setting. Appreciate cardiology input. Consultants:  Cardiology, Dr. Diona Browner.   Procedures:  Echocardiogram:    ------------------------------------------------------------ Study Conclusions  - Left ventricle: The cavity size was normal. There was mild concentric hypertrophy. Systolic function was vigorous. The estimated ejection fraction was in the range of 65% to 70%. Wall motion was normal; there were no regional wall motion abnormalities. The study is not technically sufficient to allow evaluation of LV diastolic function. - Ventricular septum: The contour showed diastolic flattening and systolic flattening consistent with RV pressure and volume overload. - Aortic valve: Mildly calcified annulus. Trileaflet. No significant regurgitation. - Mitral valve: Mildly thickened leaflets . Trivial regurgitation. - Left atrium: The atrium was moderately dilated. - Right ventricle: The cavity size was severely dilated. Systolic function was low normal - appears reduced further in apical views with hypokinesis to akinesis of distal free wall. - Right atrium: The atrium was moderately dilated. - Tricuspid valve: Suspect at least moderate regurgitation with fairly laminar flow noted across tricuspid valve due to incomplete coaptation. Peak RV-RA gradient: 33mm Hg (S). - Pulmonic valve: Mild regurgitation. - Pulmonary arteries: Systolic pressure was likely moderately increased if CVP elevated (could not estimate). - Inferior vena cava: Unable to estimate CVP. - Pericardium, extracardiac: There was no pericardial effusion. Transthoracic echocardiography. M-mode, complete 2D, spectral Doppler, and color Doppler. Height: Height: 152.4cm. Height: 60in. Weight:  Weight: 107kg. Weight: 235.5lb. Body mass index: BMI: 46.1kg/m^2. Body surface area:  BSA: 31m^2. Patient status: Inpatient. Location: Bedside.   Antibiotics:  None.                   Code Status: Full code.  Family Communication: Discussed plan with patient at bedside.   Disposition Plan: Home when medically stable.  Time spent: 20 minutes.   LOS: 2 days   Wilson Singer Pager [phone]  04/16/2013, 8:42 AM

## 2013-04-16 NOTE — Progress Notes (Addendum)
SUBJECTIVE:I feel better. Didn't sleep well.  Active Problems:   DIABETES MELLITUS, TYPE II   HYPERLIPIDEMIA   Morbid obesity with BMI of 40.0-44.9, adult   HYPERTENSION   FIBRILLATION, ATRIAL   Long term current use of anticoagulant   CHF exacerbation   Acute respiratory failure   Acute renal failure   Acute diastolic heart failure  LABS: Basic Metabolic Panel:  Recent Labs  45/40/98 0458 04/16/13 0526  NA 142 142  K 5.0 5.1  CL 101 100  CO2 35* 34*  GLUCOSE 114* 149*  BUN 41* 44*  CREATININE 1.91* 1.97*  CALCIUM 9.0 9.6   CBC:  Recent Labs  04/14/13 1030  WBC 7.9  NEUTROABS 5.1  HGB 13.1  HCT 44.1  MCV 84.3  PLT 293   Cardiac Enzymes:  Recent Labs  04/14/13 1847 04/14/13 2353 04/15/13 0458  TROPONINI <0.30 <0.30 <0.30   Thyroid Function Tests:  Recent Labs  04/14/13 1847  TSH 3.470    RADIOLOGY: Dg Chest Portable 1 View  04/14/2013   *RADIOLOGY REPORT*  Clinical Data: Short of breath  PORTABLE CHEST - 1 VIEW  Comparison: None.  Findings: Cardiac silhouette is enlarged.  There is central venous pulmonary congestion.  The lung bases are poorly evaluated due to technique and body habitus.  Likely bilateral effusions.  IMPRESSION: Cardiomegaly, central venous congestion. and  bilateral effusions consistent congestive heart failure.   Original Report Authenticated By: Genevive Bi, M.D.   Echocardiogram: 04/15/2013 Mild LVH; nl EF; septal flattening; biatrial enlargement; RV enlargement and dysfunction; moderate TR mild to moderate pulmonary hypertension   PHYSICAL EXAM BP 135/74  Pulse 75  Temp(Src) 98.1 F (36.7 C) (Oral)  Resp 18  Ht 5' (1.524 m)  Wt 238 lb 11.2 oz (108.274 kg)  BMI 46.62 kg/m2  SpO2 95%  Weight increased from baseline of 200-205 pounds General: Well developed, well nourished, in no acute distress; lethargic Head: Eyes PERRLA, No xanthomas.   Normal cephalic and atramatic  Lungs: Inspiratory and expiratory wheezes with  diminished breath sounds at the bases. Heart: HRIR S1 S2, No RG; grade 2/6 systolic murmur at the upper left sternal border;  Pulses are 2+ & equal.No carotid bruit. +JVD   No abdominal bruits. No femoral bruits. Abdomen: Bowel sounds are positive, abdomen mildly distended and non-tender without masses Msk:  Back normal, normal gait. Normal strength and tone for age. Extremities: No clubbing, cyanosis, 2+ pre-tibial edema.  DP +1; chronic stasis changes Neuro: Alert and oriented X 3. Psych:  Good affect, responds appropriately  TELEMETRY: Reviewed telemetry pt JX:BJYNWG fibrillation, 70's.   Total I&O since admission: -2 Liters.  No significant diuresis until the past 24 hours. Weight has actually increased 3 kg since admission.  ASSESSMENT AND PLAN: 1. Acute on Chronic Diastolic CHF with Anasarca: Very little urine output over last 24 hours on IV lasix 80mg  Q 6 hrs. She has normal EF with evidence of severe RV dilation demonstrating right ventricular failure as etiology of CHF. Will give one dose of metolazone 2.5 mg, and begin IV lasix gtt for 24 hours. Recommend f/c for strict I/O. Creatinine 1.97 with potassium of 5.1. Watch renal fx closely.   2. Atrial fibrillation: Rate is much improved from bradycardia yesterday after decreasing metoprolol to 100 mg Q 24 hrs. Continue coumadin.TSH normal.  3. Renal insufficiency: Watch this closely to avoid worsening with use of diuretics. Baseline is 1.09.   4. Morbid obesity: Hypoventilation syndrome considered, with elevated RV size. Consider  OSA evaluation and use of CPAP. Overnight O2 sats will be ordered.  Bettey Mare. Lyman Bishop NP [facility] 04/16/2013, 8:47 AM  Cardiology Attending Patient interviewed and examined. Discussed with Joni Reining, NP.  Above note annotated and modified based upon my findings.  Patient presents with congestive heart failure, likely related to cor pulmonale. Arterial blood gas and overnight oximetry   pending.  Chronic pulmonary emboli is an alternative diagnosis that may need to be considered.  Considerable additional diuresis will be necessary.  Atrial fibrillation is apparently chronic. Appropriate treatment strategy of rate control and anticoagulation utilized in the past and will be maintained in the future.  There has been no evidence for occult blood loss.  We will continue to monitor for this possibility.  [place] Bing, MD 04/16/2013, 5:53 PM

## 2013-04-16 NOTE — Progress Notes (Signed)
ANTICOAGULATION CONSULT NOTE   Pharmacy Consult for Coumadin (chronic PTA) Indication: atrial fibrillation  Allergies  Allergen Reactions  . Penicillins Rash   Patient Measurements: Height: 5' (152.4 cm) Weight: 238 lb 11.2 oz (108.274 kg) IBW/kg (Calculated) : 45.5  Vital Signs: Temp: 98.1 F (36.7 C) (06/24 0611) Temp src: Oral (06/24 0611) BP: 135/74 mmHg (06/24 0611) Pulse Rate: 75 (06/24 0611)  Labs:  Recent Labs  04/14/13 1030 04/14/13 1847 04/14/13 2353 04/15/13 0458 04/16/13 0526  HGB 13.1  --   --   --   --   HCT 44.1  --   --   --   --   PLT 293  --   --   --   --   LABPROT 24.7*  --   --  24.4* 24.8*  INR 2.35*  --   --  2.32* 2.37*  CREATININE 1.76*  --   --  1.91* 1.97*  TROPONINI <0.30 <0.30 <0.30 <0.30  --     Estimated Creatinine Clearance: 32.6 ml/min (by C-G formula based on Cr of 1.97).   Medical History: Past Medical History  Diagnosis Date  . Essential hypertension, benign   . Type 2 diabetes mellitus     Type II  . Atrial fibrillation   . Nephrolithiasis   . Overweight(278.02)   . UTI (urinary tract infection)   . Hypothyroidism (acquired)     Medications:  Scheduled:  . antiseptic oral rinse  15 mL Mouth Rinse BID  . aspirin EC  81 mg Oral Daily  . bimatoprost  1 drop Both Eyes QHS  . insulin aspart  0-15 Units Subcutaneous TID WC  . insulin aspart  0-5 Units Subcutaneous QHS  . insulin glargine  30 Units Subcutaneous QHS  . levothyroxine  200 mcg Oral QAC breakfast  . metoprolol  100 mg Oral Daily  . nystatin   Topical BID  . sodium chloride  3 mL Intravenous Q12H  . Warfarin - Pharmacist Dosing Inpatient   Does not apply Q24H   Assessment: 62 yo F on chronic warfarin 2.5mg  daily except 5mg  on Wed for Afib. INR is therapeutic.   No bleeding noted.  Pt is also on Aspirin.  Goal of Therapy:  INR 2-3   Plan:  Coumadin 2.5mg  po x1 (home dose) INR daily  Valrie Hart A 04/16/2013,11:13 AM

## 2013-04-17 ENCOUNTER — Inpatient Hospital Stay (HOSPITAL_COMMUNITY): Payer: BC Managed Care – PPO

## 2013-04-17 ENCOUNTER — Encounter (HOSPITAL_COMMUNITY): Payer: BC Managed Care – PPO

## 2013-04-17 LAB — HEPATIC FUNCTION PANEL
ALT: 11 U/L (ref 0–35)
Alkaline Phosphatase: 131 U/L — ABNORMAL HIGH (ref 39–117)
Bilirubin, Direct: 0.3 mg/dL (ref 0.0–0.3)
Indirect Bilirubin: 0.4 mg/dL (ref 0.3–0.9)
Total Bilirubin: 0.7 mg/dL (ref 0.3–1.2)

## 2013-04-17 LAB — GLUCOSE, CAPILLARY
Glucose-Capillary: 95 mg/dL (ref 70–99)
Glucose-Capillary: 91 mg/dL (ref 70–99)

## 2013-04-17 LAB — URINALYSIS, ROUTINE W REFLEX MICROSCOPIC
Bilirubin Urine: NEGATIVE
Nitrite: NEGATIVE
Specific Gravity, Urine: 1.01 (ref 1.005–1.030)
Urobilinogen, UA: 0.2 mg/dL (ref 0.0–1.0)

## 2013-04-17 LAB — URINE MICROSCOPIC-ADD ON

## 2013-04-17 LAB — BLOOD GAS, ARTERIAL
Acid-Base Excess: 5.9 mmol/L — ABNORMAL HIGH (ref 0.0–2.0)
Bicarbonate: 33.9 mEq/L — ABNORMAL HIGH (ref 20.0–24.0)
Drawn by: 21310
FIO2: 100 %
FIO2: 100 %
MECHVT: 370 mL
O2 Saturation: 95.6 %
O2 Saturation: 99.1 %
RATE: 20 resp/min
pCO2 arterial: 126 mmHg (ref 35.0–45.0)
pH, Arterial: 7.06 — CL (ref 7.350–7.450)
pO2, Arterial: 89.5 mmHg (ref 80.0–100.0)
pO2, Arterial: 128 mmHg — ABNORMAL HIGH (ref 80.0–100.0)

## 2013-04-17 LAB — CBC WITH DIFFERENTIAL/PLATELET
Eosinophils Absolute: 0 10*3/uL (ref 0.0–0.7)
Eosinophils Relative: 0 % (ref 0–5)
HCT: 46.7 % — ABNORMAL HIGH (ref 36.0–46.0)
Lymphs Abs: 0.3 10*3/uL — ABNORMAL LOW (ref 0.7–4.0)
MCH: 24.8 pg — ABNORMAL LOW (ref 26.0–34.0)
MCV: 89.6 fL (ref 78.0–100.0)
Monocytes Absolute: 2.4 10*3/uL — ABNORMAL HIGH (ref 0.1–1.0)
Monocytes Relative: 27 % — ABNORMAL HIGH (ref 3–12)
Platelets: 317 10*3/uL (ref 150–400)
RBC: 5.21 MIL/uL — ABNORMAL HIGH (ref 3.87–5.11)

## 2013-04-17 LAB — BASIC METABOLIC PANEL
BUN: 47 mg/dL — ABNORMAL HIGH (ref 6–23)
CO2: 36 mEq/L — ABNORMAL HIGH (ref 19–32)
Calcium: 9.6 mg/dL (ref 8.4–10.5)
Chloride: 100 mEq/L (ref 96–112)
Creatinine, Ser: 2.09 mg/dL — ABNORMAL HIGH (ref 0.50–1.10)

## 2013-04-17 LAB — PROTIME-INR
INR: 3.02 — ABNORMAL HIGH (ref 0.00–1.49)
INR: 3.35 — ABNORMAL HIGH (ref 0.00–1.49)
Prothrombin Time: 32.7 seconds — ABNORMAL HIGH (ref 11.6–15.2)

## 2013-04-17 MED ORDER — ROCURONIUM BROMIDE 50 MG/5ML IV SOLN
INTRAVENOUS | Status: AC
  Filled 2013-04-17: qty 2

## 2013-04-17 MED ORDER — ETOMIDATE 2 MG/ML IV SOLN
INTRAVENOUS | Status: AC
  Filled 2013-04-17: qty 20

## 2013-04-17 MED ORDER — DEXTROSE-NACL 5-0.45 % IV SOLN
INTRAVENOUS | Status: DC
  Administered 2013-04-17: 20:00:00 via INTRAVENOUS

## 2013-04-17 MED ORDER — DOPAMINE-DEXTROSE 3.2-5 MG/ML-% IV SOLN
2.0000 ug/kg/min | INTRAVENOUS | Status: DC
  Administered 2013-04-20: 2 ug/kg/min via INTRAVENOUS
  Filled 2013-04-17: qty 250

## 2013-04-17 MED ORDER — BIOTENE DRY MOUTH MT LIQD
15.0000 mL | Freq: Four times a day (QID) | OROMUCOSAL | Status: DC
  Administered 2013-04-17 – 2013-04-26 (×34): 15 mL via OROMUCOSAL

## 2013-04-17 MED ORDER — DEXTROSE 50 % IV SOLN: 25.0000 mL | Freq: Once | INTRAVENOUS | Status: AC | PRN

## 2013-04-17 MED ORDER — VANCOMYCIN HCL 10 G IV SOLR
1500.0000 mg | INTRAVENOUS | Status: DC
  Administered 2013-04-17 – 2013-04-19 (×3): 1500 mg via INTRAVENOUS
  Filled 2013-04-17 (×6): qty 1500

## 2013-04-17 MED ORDER — LIDOCAINE HCL (CARDIAC) 20 MG/ML IV SOLN
INTRAVENOUS | Status: AC
  Filled 2013-04-17: qty 5

## 2013-04-17 MED ORDER — INSULIN GLARGINE 100 UNIT/ML ~~LOC~~ SOLN: 20.0000 [IU] | Freq: Every day | SUBCUTANEOUS | Status: DC

## 2013-04-17 MED ORDER — CHLORHEXIDINE GLUCONATE 0.12 % MT SOLN
15.0000 mL | Freq: Two times a day (BID) | OROMUCOSAL | Status: DC
  Administered 2013-04-17 – 2013-04-22 (×11): 15 mL via OROMUCOSAL
  Filled 2013-04-17 (×11): qty 15

## 2013-04-17 MED ORDER — MORPHINE SULFATE 2 MG/ML IJ SOLN
2.0000 mg | INTRAMUSCULAR | Status: DC | PRN
  Administered 2013-04-17: 2 mg via INTRAVENOUS
  Filled 2013-04-17: qty 1

## 2013-04-17 MED ORDER — LORAZEPAM 2 MG/ML IJ SOLN
1.0000 mg | Freq: Four times a day (QID) | INTRAMUSCULAR | Status: DC | PRN
  Administered 2013-04-17: 1 mg via INTRAVENOUS
  Filled 2013-04-17 (×2): qty 1

## 2013-04-17 MED ORDER — MORPHINE SULFATE 2 MG/ML IJ SOLN
2.0000 mg | INTRAMUSCULAR | Status: DC | PRN
  Administered 2013-04-17: 4 mg via INTRAVENOUS
  Administered 2013-04-18 – 2013-04-19 (×2): 2 mg via INTRAVENOUS
  Administered 2013-04-19: 4 mg via INTRAVENOUS
  Administered 2013-04-20 – 2013-04-22 (×4): 2 mg via INTRAVENOUS
  Filled 2013-04-17: qty 1
  Filled 2013-04-17: qty 2
  Filled 2013-04-17 (×2): qty 1
  Filled 2013-04-17: qty 2
  Filled 2013-04-17 (×3): qty 1

## 2013-04-17 MED ORDER — LEVOFLOXACIN IN D5W 500 MG/100ML IV SOLN: 500.0000 mg | INTRAVENOUS | Status: DC

## 2013-04-17 MED ORDER — MIDAZOLAM HCL 2 MG/2ML IJ SOLN
2.0000 mg | INTRAMUSCULAR | Status: DC | PRN
  Administered 2013-04-17 – 2013-04-22 (×10): 2 mg via INTRAVENOUS
  Filled 2013-04-17 (×4): qty 2
  Filled 2013-04-17: qty 4
  Filled 2013-04-17 (×5): qty 2

## 2013-04-17 MED ORDER — FUROSEMIDE 10 MG/ML IJ SOLN
80.0000 mg | Freq: Two times a day (BID) | INTRAMUSCULAR | Status: DC
  Administered 2013-04-17: 80 mg via INTRAVENOUS
  Filled 2013-04-17: qty 8

## 2013-04-17 MED ORDER — SUCCINYLCHOLINE CHLORIDE 20 MG/ML IJ SOLN
INTRAMUSCULAR | Status: AC
  Filled 2013-04-17: qty 1

## 2013-04-17 MED ORDER — DEXTROSE 50 % IV SOLN
INTRAVENOUS | Status: AC
  Administered 2013-04-17: 25 mL
  Filled 2013-04-17: qty 50

## 2013-04-17 MED ORDER — DEXTROSE 50 % IV SOLN
INTRAVENOUS | Status: AC
  Administered 2013-04-17: 25 mL via INTRAVENOUS
  Filled 2013-04-17: qty 50

## 2013-04-17 MED ORDER — INSULIN ASPART 100 UNIT/ML ~~LOC~~ SOLN
2.0000 [IU] | SUBCUTANEOUS | Status: DC
  Administered 2013-04-18 – 2013-04-20 (×4): 2 [IU] via SUBCUTANEOUS
  Administered 2013-04-20: 4 [IU] via SUBCUTANEOUS
  Administered 2013-04-21 (×2): 2 [IU] via SUBCUTANEOUS

## 2013-04-17 MED ORDER — FUROSEMIDE 10 MG/ML IJ SOLN: 80.0000 mg | Freq: Two times a day (BID) | INTRAMUSCULAR | Status: DC

## 2013-04-17 MED ORDER — PANTOPRAZOLE SODIUM 40 MG IV SOLR
40.0000 mg | Freq: Every day | INTRAVENOUS | Status: DC
  Administered 2013-04-17 – 2013-04-24 (×8): 40 mg via INTRAVENOUS
  Filled 2013-04-17 (×8): qty 40

## 2013-04-17 MED ORDER — DEXTROSE 50 % IV SOLN: 25.0000 mL | Freq: Once | INTRAVENOUS | Status: DC | PRN

## 2013-04-17 MED ORDER — VITAMIN K1 10 MG/ML IJ SOLN
5.0000 mg | Freq: Once | INTRAMUSCULAR | Status: AC
  Administered 2013-04-17: 5 mg via SUBCUTANEOUS
  Filled 2013-04-17: qty 1

## 2013-04-17 MED ORDER — LEVOFLOXACIN IN D5W 750 MG/150ML IV SOLN
750.0000 mg | INTRAVENOUS | Status: DC
  Administered 2013-04-17 – 2013-04-23 (×4): 750 mg via INTRAVENOUS
  Filled 2013-04-17 (×6): qty 150

## 2013-04-17 NOTE — Progress Notes (Signed)
ANTICOAGULATION CONSULT NOTE   Pharmacy Consult for Coumadin  Indication: atrial fibrillation  Allergies  Allergen Reactions  . Penicillins Rash   Patient Measurements: Height: 5' (152.4 cm) Weight: 239 lb 3.2 oz (108.5 kg) IBW/kg (Calculated) : 45.5  Vital Signs: Temp: 97.7 F (36.5 C) (06/25 0730) Temp src: Axillary (06/25 0730) BP: 75/54 mmHg (06/25 0800) Pulse Rate: 82 (06/25 0800)  Labs:  Recent Labs  04/14/13 1030 04/14/13 1847 04/14/13 2353 04/15/13 0458 04/16/13 0526 04/17/13 0447  HGB 13.1  --   --   --   --   --   HCT 44.1  --   --   --   --   --   PLT 293  --   --   --   --   --   LABPROT 24.7*  --   --  24.4* 24.8* 29.7*  INR 2.35*  --   --  2.32* 2.37* 3.02*  CREATININE 1.76*  --   --  1.91* 1.97* 2.09*  TROPONINI <0.30 <0.30 <0.30 <0.30  --   --     Estimated Creatinine Clearance: 30.8 ml/min (by C-G formula based on Cr of 2.09).   Medical History: Past Medical History  Diagnosis Date  . Hypertension   . Type 2 diabetes mellitus     Type II  . Atrial fibrillation   . Nephrolithiasis   . Overweight(278.02)   . UTI (urinary tract infection)   . Hypothyroidism (acquired)     Medications:  Scheduled:  . antiseptic oral rinse  15 mL Mouth Rinse QID  . bimatoprost  1 drop Both Eyes QHS  . chlorhexidine  15 mL Mouth Rinse BID  . etomidate      . furosemide  80 mg Intravenous BID  . insulin aspart  2-6 Units Subcutaneous Q4H  . insulin glargine  20 Units Subcutaneous QHS  . levofloxacin (LEVAQUIN) IV  750 mg Intravenous Q48H  . levothyroxine  200 mcg Oral QAC breakfast  . lidocaine (cardiac) 100 mg/26ml      . metoprolol  100 mg Oral Daily  . nystatin   Topical BID  . pantoprazole (PROTONIX) IV  40 mg Intravenous Daily  . rocuronium      . sodium chloride  3 mL Intravenous Q12H  . succinylcholine      . vancomycin  1,500 mg Intravenous Q24H  . Warfarin - Pharmacist Dosing Inpatient   Does not apply Q24H   Assessment: 62 yo F on  chronic warfarin 2.5mg  daily except 5mg  on Wed for Afib. INR was therapeutic on admission & has been stable ~2.3, however large jump in INR overnight to high end of goal range.  Acute events noted.  No bleeding noted.    Goal of Therapy:  INR 2-3   Plan:  Hold Coumadin today INR daily  Elson Clan 04/17/2013,8:29 AM

## 2013-04-17 NOTE — Progress Notes (Signed)
Hypoglycemic Event  CBG:61  Treatment: D50 IV 25 mL  Symptoms: None  Follow-up CBG: Time:1315 CBG Result:93  Possible Reasons for Event: Other: NPO  Comments/MD notified:     Ruvi Fullenwider SLAUGHTER  Remember to initiate Hypoglycemia Order Set & complete

## 2013-04-17 NOTE — Progress Notes (Signed)
Hypoglycemic Event  CBG: 61  Treatment:25cc of D50   Follow-up CBG: Time:2000 CBG Result:91  Possible Reasons for Event:  Comments/MD notified:Dr Josph Macho, Rochele Raring  Remember to initiate Hypoglycemia Order Set & complete

## 2013-04-17 NOTE — Progress Notes (Signed)
SUBJECTIVE:Intubated. Decompensated overnight with respiratory failure requiring intubation.  Active Problems:   DIABETES MELLITUS, TYPE II   HYPERLIPIDEMIA   Morbid obesity with BMI of 40.0-44.9, adult   HYPERTENSION   FIBRILLATION, ATRIAL   Long term current use of anticoagulant   CHF exacerbation   Acute respiratory failure   Acute renal failure   Acute diastolic heart failure  LABS: Basic Metabolic Panel:  Recent Labs  16/10/96 0526 04/17/13 0447  NA 142 141  K 5.1 5.3*  CL 100 100  CO2 34* 36*  GLUCOSE 149* 122*  BUN 44* 47*  CREATININE 1.97* 2.09*  CALCIUM 9.6 9.6   Creatinine was normal one year ago. CBC:  Recent Labs  04/14/13 1030  WBC 7.9  NEUTROABS 5.1  HGB 13.1  HCT 44.1  MCV 84.3  PLT 293   Thyroid Function Tests:  Recent Labs  04/14/13 1847  TSH 3.470   ABG: 7.06/126/90 on FiO2 of 100%  RADIOLOGY: Portable Chest Xray  04/17/2013   *RADIOLOGY REPORT*  Clinical Data: ET tube placement.  PORTABLE CHEST - 1 VIEW  Comparison: 04/14/2013  Findings: Endotracheal tube has been placed and is at the level of the carina directed toward the right main stem bronchus.  Recommend retracting 3 cm.  Cardiomegaly with vascular congestion and mild perihilar and lower lobe opacities.  No visible effusions.  No acute bony abnormality.  IMPRESSION: Endotracheal tube at the level of the carina directed toward the right mainstem bronchus.  Recommend retracting 3 cm.  Cardiomegaly.  Bilateral perihilar and lower lobe airspace opacities, likely edema.   Original Report Authenticated By: Charlett Nose, M.D.    PHYSICAL EXAM BP 112/72  Pulse 96  Temp(Src) 97.8 F (36.6 C) (Axillary)  Resp 20  Ht 5' (1.524 m)  Wt 239 lb 3.2 oz (108.5 kg)  BMI 46.72 kg/m2  SpO2 94% General: Well developed, well nourished, in no acute distress, intubated. Alert on the vent and appears comfortable Head: Eyes PERRLA, No xanthomas.   Normal cephalic and atramatic  Lungs: Clear  bilaterally to auscultation, no wheezes, diminished sounds in the bases. Heart: Normal S1 S2, No RG; irregular rhythm; modest basilar systolic murmur .  Pulses are 2+ & equal.  No carotid bruit.  JVD-difficult to evaluate.  No abdominal bruits. No femoral bruits. Abdomen: Bowel sounds are positive, abdomen soft and non-tender without masses  Msk:  Back normal, normal gait. Normal strength and tone for age. Extremities: Positive for  clubbing, no cyanosis, generalized edema 2+ .  DP +1 Neuro: Alert and oriented X 3. Psych:  Good affect, responds appropriately  TELEMETRY: Atrial fibrillation with controlled ventricular response  Total I&O: -3.1 L; weight unchanged from yesterday  ASSESSMENT AND PLAN:  1.VDRF: Likely due to hypoventilation syndrome and CHF decompensation. She appears comfortable and is without pain with the exception of her throat being sore from intubation. Lungs sounds are essentially clear. She did not receive sedative or pain medication last evening. Consider hospital acquired pneumonia as differential.   2. Right sided heart failure with anasarca: She continues with evidence of fluid overload, despite >2000 cc diureses. May have dropped pre-load too quickly. Creatinine elevated at 2.09. Will stop lasix gtt for now and continue to give IV bolus. Q 12 hours. She still has significant fluid overload. Check CMP.  3. Atrial fibrillation: Heart rate is well controlled, INR is elevated and therefore coumadin is held. Some bloody stomach contents per NG tube. Will check CBC.  PPI added to Rx.  4. Diabetes: Controlled presently.  Amber Armstrong. Lyman Bishop NP [facility] 04/17/2013, 7:21 AM  Cardiology Attending Patient interviewed and examined. Discussed with Joni Reining, NP.  Above note annotated and modified based upon my findings.  Respiratory failure related to likely underlying Pickwickian Syndrome exacerbated by congestive heart failure.  Progressive renal  dysfunction is problematic, but we have no choice other than to continue diuresis, and she remains 35 pounds above her dry weight.  Low dose dopamine can be added if creatinine continues to increase.   [place] Bing, MD 04/17/2013, 8:34 AM

## 2013-04-17 NOTE — Progress Notes (Signed)
Hypoglycemic Event  CBG: 59  Treatment: D50 IV 25 mL  Symptoms: None  Follow-up CBG: Time:1715 CBG Result:83  Possible Reasons for Event: Other: NPO  Comments/MD notified:Dr. Henriette Combs, Laurieann Friddle SLAUGHTER  Remember to initiate Hypoglycemia Order Set & complete

## 2013-04-17 NOTE — Progress Notes (Signed)
Per MDs order RT withdrew ET tube 3cm, for placement at 20cm at the lips. Patient tolerated well, no complications noted.

## 2013-04-17 NOTE — Progress Notes (Signed)
ANTIBIOTIC CONSULT NOTE - INITIAL  Pharmacy Consult for Vancomycin Indication: pneumonia  Allergies  Allergen Reactions  . Penicillins Rash    Patient Measurements: Height: 5' (152.4 cm) Weight: 239 lb 3.2 oz (108.5 kg) IBW/kg (Calculated) : 45.5  Vital Signs: Temp: 97.8 F (36.6 C) (06/25 0525) Temp src: Axillary (06/25 0525) BP: 80/60 mmHg (06/25 0700) Pulse Rate: 96 (06/25 0536) Intake/Output from previous day: 06/24 0701 - 06/25 0700 In: 283 [P.O.:240; I.V.:43] Out: 2730 [Urine:2730] Intake/Output from this shift:    Labs:  Recent Labs  04/14/13 1030 04/15/13 0458 04/16/13 0526 04/17/13 0447  WBC 7.9  --   --   --   HGB 13.1  --   --   --   PLT 293  --   --   --   CREATININE 1.76* 1.91* 1.97* 2.09*   Estimated Creatinine Clearance: 30.8 ml/min (by C-G formula based on Cr of 2.09). No results found for this basename: VANCOTROUGH, VANCOPEAK, VANCORANDOM, GENTTROUGH, GENTPEAK, GENTRANDOM, TOBRATROUGH, TOBRAPEAK, TOBRARND, AMIKACINPEAK, AMIKACINTROU, AMIKACIN,  in the last 72 hours   Microbiology: No results found for this or any previous visit (from the past 720 hour(s)).  Medical History: Past Medical History  Diagnosis Date  . Hypertension   . Type 2 diabetes mellitus     Type II  . Atrial fibrillation   . Nephrolithiasis   . Overweight(278.02)   . UTI (urinary tract infection)   . Hypothyroidism (acquired)     Medications:  Scheduled:  . antiseptic oral rinse  15 mL Mouth Rinse QID  . bimatoprost  1 drop Both Eyes QHS  . chlorhexidine  15 mL Mouth Rinse BID  . etomidate      . furosemide  80 mg Intramuscular BID  . insulin aspart  2-6 Units Subcutaneous Q4H  . insulin glargine  20 Units Subcutaneous QHS  . levofloxacin (LEVAQUIN) IV  500 mg Intravenous Q24H  . levothyroxine  200 mcg Oral QAC breakfast  . lidocaine (cardiac) 100 mg/31ml      . metoprolol  100 mg Oral Daily  . nystatin   Topical BID  . pantoprazole (PROTONIX) IV  40 mg  Intravenous Daily  . rocuronium      . sodium chloride  3 mL Intravenous Q12H  . succinylcholine      . Warfarin - Pharmacist Dosing Inpatient   Does not apply Q24H   Assessment: 62 yo obese F admitted with HF exacerbation has now decompensated, on ventilator with suspicion for HAP.   Patient has some ARI but I/O from yesterday -2.4L. Starting on Levaquin & Vancomycin.   Levaquin 04/17/13>> Vancomycin 04/17/13>>  Goal of Therapy:  Vancomycin trough level 15-20 mcg/ml  Plan:  1) Change Levaquin 750mg  q48h 2 Vancomycin 1500mg  IV Q24h 3) Check Vancomycin trough at steady state 4) Monitor renal function and cx data    Elson Clan 04/17/2013,7:56 AM

## 2013-04-17 NOTE — Progress Notes (Signed)
eLink Physician-Brief Progress Note Patient Name: Amber Armstrong DOB: 03/01/1951 MRN: [id number]  Date of Service  04/17/2013   HPI/Events of Note  CBGs low  eICU Interventions  Stop lantus   Intervention Category Major Interventions: Hyperglycemia - active titration of insulin therapy  Shan Levans 04/17/2013, 10:10 PM

## 2013-04-17 NOTE — Consult Note (Signed)
Consult requested by: Dr. Karilyn Cota Consult requested for respiratory failure:  HPI: This is a 62 year old who has been having increasing problems with fluid retention for a month or so. She was admitted and felt to have cor pulmonale. Echocardiogram shows good left ventricular function but significant dysfunction of the right ventricle. Further history is that she appears to have episodes that may be sleep apnea/obesity hypoventilation. She was admitted with the above problems and became acutely worse last night requiring intubation and mechanical ventilation. She remains intubated and on the ventilator.  Past Medical History  Diagnosis Date  . Hypertension   . Type 2 diabetes mellitus     Type II  . Atrial fibrillation   . Nephrolithiasis   . Overweight(278.02)   . UTI (urinary tract infection)   . Hypothyroidism (acquired)      Family History  Problem Relation Age of Onset  . Arrhythmia Mother     Atrial fib     History   Social History  . Marital Status: Divorced    Spouse Name: N/A    Number of Children: 0  . Years of Education: N/A   Occupational History  . Employed     [employer]   Social History Main Topics  . Smoking status: Never Smoker   . Smokeless tobacco: Never Used  . Alcohol Use: No  . Drug Use: No  . Sexually Active: None   Other Topics Concern  . None   Social History Narrative   Full time pt works at [employer]. Widowed. No regular exercise.     ROS: Unobtainable    Objective: Vital signs in last 24 hours: Temp:  [97.4 F (36.3 C)-98.5 F (36.9 C)] 97.7 F (36.5 C) (06/25 0730) Pulse Rate:  [64-96] 82 (06/25 0800) Resp:  [14-20] 15 (06/25 0800) BP: (75-153)/(54-91) 75/54 mmHg (06/25 0800) SpO2:  [78 %-100 %] 100 % (06/25 0800) FiO2 (%):  [99.9 %-100 %] 100 % (06/25 0803) Weight:  [108.5 kg (239 lb 3.2 oz)] 108.5 kg (239 lb 3.2 oz) (06/25 0525) Weight change: 1.4 kg (3 lb 1.4 oz) Last BM Date: 04/14/13  Intake/Output from  previous day: 06/24 0701 - 06/25 0700 In: 283 [P.O.:240; I.V.:43] Out: 2730 [Urine:2730]  PHYSICAL EXAM She is obese. She is arousable. Pupils are reactive mucous membranes are moist her neck is supple and I can't tell if she has any JVD. Her chest shows some rales in the bases bilaterally. Her heart is irregular without gallop. Her abdomen is soft obese without masses. She still has edema of the extremities. Her central nervous system exam shows that she is intubated and somewhat sedated but arousable  Lab Results: Basic Metabolic Panel:  Recent Labs  16/10/96 0526 04/17/13 0447  NA 142 141  K 5.1 5.3*  CL 100 100  CO2 34* 36*  GLUCOSE 149* 122*  BUN 44* 47*  CREATININE 1.97* 2.09*  CALCIUM 9.6 9.6   Liver Function Tests:  Recent Labs  04/17/13 0447  AST 25  ALT 11  ALKPHOS 131*  BILITOT 0.7  PROT 7.8  ALBUMIN 3.4*   No results found for this basename: LIPASE, AMYLASE,  in the last 72 hours No results found for this basename: AMMONIA,  in the last 72 hours CBC:  Recent Labs  04/14/13 1030 04/17/13 0447  WBC 7.9 8.9  NEUTROABS 5.1 6.0  HGB 13.1 12.9  HCT 44.1 46.7*  MCV 84.3 89.6  PLT 293 317   Cardiac Enzymes:  Recent Labs  04/14/13 1847 04/14/13 2353 04/15/13 0458  TROPONINI <0.30 <0.30 <0.30   BNP:  Recent Labs  04/14/13 1030  PROBNP 12159.0*   D-Dimer: No results found for this basename: DDIMER,  in the last 72 hours CBG:  Recent Labs  04/14/13 2141 04/15/13 1709 04/15/13 2106 04/16/13 1647 04/16/13 2038 04/17/13 0720  GLUCAP 113* 133* 183* 97 132* 95   Hemoglobin A1C: No results found for this basename: HGBA1C,  in the last 72 hours Fasting Lipid Panel: No results found for this basename: CHOL, HDL, LDLCALC, TRIG, CHOLHDL, LDLDIRECT,  in the last 72 hours Thyroid Function Tests:  Recent Labs  04/14/13 1847  TSH 3.470   Anemia Panel: No results found for this basename: VITAMINB12, FOLATE, FERRITIN, TIBC, IRON,  RETICCTPCT,  in the last 72 hours Coagulation:  Recent Labs  04/16/13 0526 04/17/13 0447  LABPROT 24.8* 29.7*  INR 2.37* 3.02*   Urine Drug Screen: Drugs of Abuse  No results found for this basename: labopia, cocainscrnur, labbenz, amphetmu, thcu, labbarb    Alcohol Level: No results found for this basename: ETH,  in the last 72 hours Urinalysis: No results found for this basename: COLORURINE, APPERANCEUR, LABSPEC, PHURINE, GLUCOSEU, HGBUR, BILIRUBINUR, KETONESUR, PROTEINUR, UROBILINOGEN, NITRITE, LEUKOCYTESUR,  in the last 72 hours Misc. Labs:   ABGS:  Recent Labs  04/17/13 0815  PHART 7.372  PO2ART 128.0*  TCO2 28.2  HCO3 31.0*     MICROBIOLOGY: No results found for this or any previous visit (from the past 240 hour(s)).  Studies/Results: Portable Chest Xray  04/17/2013   *RADIOLOGY REPORT*  Clinical Data: ET tube placement.  PORTABLE CHEST - 1 VIEW  Comparison: 04/14/2013  Findings: Endotracheal tube has been placed and is at the level of the carina directed toward the right main stem bronchus.  Recommend retracting 3 cm.  Cardiomegaly with vascular congestion and mild perihilar and lower lobe opacities.  No visible effusions.  No acute bony abnormality.  IMPRESSION: Endotracheal tube at the level of the carina directed toward the right mainstem bronchus.  Recommend retracting 3 cm.  Cardiomegaly.  Bilateral perihilar and lower lobe airspace opacities, likely edema.   Original Report Authenticated By: Charlett Nose, M.D.   Dg Chest Port 1v Same Day  04/17/2013   *RADIOLOGY REPORT*  Clinical Data: Nasogastric tube placement.  PORTABLE CHEST - 1 VIEW SAME DAY  Comparison: 04/17/2013.  Findings: Nasogastric tube is followed into the stomach. Endotracheal tube terminates at the orifice of the right mainstem bronchus.  Heart appears enlarged, stable.  Bibasilar dependent air space disease appears slightly progressive.  IMPRESSION:  1.  Endotracheal tube is at the wall of the  right mainstem bronchus.  Retracting approximately 3 cm would better position the tip above the carina. These results will be called to the ordering clinician or representative by the Radiologist Assistant, and communication documented in the PACS Dashboard. 2.  Nasogastric tube is followed into the stomach. 3.  Bibasilar dependent air space disease, worsening.   Original Report Authenticated By: Leanna Battles, M.D.    Medications:  Prior to Admission:  Prescriptions prior to admission  Medication Sig Dispense Refill  . furosemide (LASIX) 20 MG tablet Take 20 mg by mouth 2 (two) times daily.      Marland Kitchen glimepiride (AMARYL) 2 MG tablet Take 2 mg by mouth daily before breakfast.      . insulin glargine (LANTUS) 100 UNIT/ML injection Inject 30 Units into the skin at bedtime.       Marland Kitchen levothyroxine (  SYNTHROID, LEVOTHROID) 200 MCG tablet Take 200 mcg by mouth daily before breakfast.      . metoprolol (TOPROL-XL) 200 MG 24 hr tablet TAKE ONE TABLET BY MOUTH EVERY DAY  30 tablet  0  . TAZTIA XT 360 MG 24 hr capsule TAKE ONE CAPSULE BY MOUTH EVERY DAY  90 capsule  0  . warfarin (COUMADIN) 2.5 MG tablet Take 2.5-5 mg by mouth See admin instructions. Take 1 tablet daily except for Wednesday pt takes 2 tablets      . bimatoprost (LUMIGAN) 0.03 % ophthalmic solution Place 1 drop into both eyes at bedtime.       Scheduled: . antiseptic oral rinse  15 mL Mouth Rinse QID  . bimatoprost  1 drop Both Eyes QHS  . chlorhexidine  15 mL Mouth Rinse BID  . etomidate      . furosemide  80 mg Intravenous BID  . insulin aspart  2-6 Units Subcutaneous Q4H  . insulin glargine  20 Units Subcutaneous QHS  . levofloxacin (LEVAQUIN) IV  750 mg Intravenous Q48H  . levothyroxine  200 mcg Oral QAC breakfast  . lidocaine (cardiac) 100 mg/59ml      . metoprolol  100 mg Oral Daily  . nystatin   Topical BID  . pantoprazole (PROTONIX) IV  40 mg Intravenous Daily  . rocuronium      . sodium chloride  3 mL Intravenous Q12H  .  succinylcholine      . vancomycin  1,500 mg Intravenous Q24H  . Warfarin - Pharmacist Dosing Inpatient   Does not apply Q24H   Continuous: . DOPamine     AVW:UJWJXB chloride, levalbuterol, morphine injection, ondansetron (ZOFRAN) IV, sodium chloride  Assesment: She has acute on chronic respiratory failure. She has what looks like right ventricular failure with cor pulmonale. I suspect she has sleep apnea and probably is chronically hypoxic at home. She does have morbid obesity. I agree that chronic pulmonary emboli could also be a potential cause of this problem. She has not responded well to diuresis thus far. Active Problems:   DIABETES MELLITUS, TYPE II   HYPERLIPIDEMIA   Morbid obesity with BMI of 40.0-44.9, adult   HYPERTENSION   FIBRILLATION, ATRIAL   Long term current use of anticoagulant   CHF exacerbation   Acute respiratory failure   Acute renal failure   Acute diastolic heart failure    Plan: Continue on mechanical ventilation for now. Her endotracheal tube appears to be too far down so I'm going to have been retracted again. Thanks for allow me to see her with you I will plan to follow with you    LOS: 3 days   Roosvelt Churchwell L 04/17/2013, 8:56 AM

## 2013-04-17 NOTE — Progress Notes (Signed)
eLink Physician-Brief Progress Note Patient Name: DAELYNN BLOWER DOB: February 02, 1951 MRN: [id number]  Date of Service  04/17/2013   HPI/Events of Note   Pt needs sedation orders on vent  eICU Interventions  See orders for prn morphine and versed   Intervention Category Major Interventions: Respiratory failure - evaluation and management  Shan Levans 04/17/2013, 3:14 PM

## 2013-04-17 NOTE — Progress Notes (Addendum)
Amber Armstrong ZOX:[id number] DOB: September 28, 1951 DOA: 04/14/2013 PCP: Pershing Proud   Subjective: This lady was admitted with congestive heart failure, mainly right heart failure. Unfortunately, yesterday she decompensated and became  unresponsive. She is now on a ventilator. She is somewhat hypotensive.           Physical Exam: Blood pressure 80/60, pulse 96, temperature 97.8 F (36.6 C), temperature source Axillary, resp. rate 19, height 5' (1.524 m), weight 108.5 kg (239 lb 3.2 oz), SpO2 94.00%. Obese. Peripheral pitting edema in her legs, less than yesterday. Lung fields show bilateral wheezing. Heart sounds are present and irregular with ventricular rate that's somewhat slower. She is on the ventilator but appears to be alert..   Investigations:     Basic Metabolic Panel:  Recent Labs  81/19/14 0526 04/17/13 0447  NA 142 141  K 5.1 5.3*  CL 100 100  CO2 34* 36*  GLUCOSE 149* 122*  BUN 44* 47*  CREATININE 1.97* 2.09*  CALCIUM 9.6 9.6       CBC:  Recent Labs  04/14/13 1030  WBC 7.9  NEUTROABS 5.1  HGB 13.1  HCT 44.1  MCV 84.3  PLT 293    Portable Chest Xray  04/17/2013   *RADIOLOGY REPORT*  Clinical Data: ET tube placement.  PORTABLE CHEST - 1 VIEW  Comparison: 04/14/2013  Findings: Endotracheal tube has been placed and is at the level of the carina directed toward the right main stem bronchus.  Recommend retracting 3 cm.  Cardiomegaly with vascular congestion and mild perihilar and lower lobe opacities.  No visible effusions.  No acute bony abnormality.  IMPRESSION: Endotracheal tube at the level of the carina directed toward the right mainstem bronchus.  Recommend retracting 3 cm.  Cardiomegaly.  Bilateral perihilar and lower lobe airspace opacities, likely edema.   Original Report Authenticated By: Charlett Nose, M.D.   Dg Chest Port 1v Same Day  04/17/2013   *RADIOLOGY REPORT*  Clinical Data: Nasogastric tube placement.  PORTABLE CHEST - 1 VIEW  SAME DAY  Comparison: 04/17/2013.  Findings: Nasogastric tube is followed into the stomach. Endotracheal tube terminates at the orifice of the right mainstem bronchus.  Heart appears enlarged, stable.  Bibasilar dependent air space disease appears slightly progressive.  IMPRESSION:  1.  Endotracheal tube is at the wall of the right mainstem bronchus.  Retracting approximately 3 cm would better position the tip above the carina. These results will be called to the ordering clinician or representative by the Radiologist Assistant, and communication documented in the PACS Dashboard. 2.  Nasogastric tube is followed into the stomach. 3.  Bibasilar dependent air space disease, worsening.   Original Report Authenticated By: Leanna Battles, M.D.      Medications: I have reviewed the patient's current medications.  Impression: 1. Acute on chronic respiratory failure, multifactorial. I think she clearly has obesity hypoventilation syndrome, and now predominantly right-sided congestive heart failure and possible pneumonia. 2. Atrial fibrillation with slow ventricular response. 3. Diabetes mellitus type 2. 4. Acute renal failure in the setting of #1. Deteriorating. 5. Chronic anticoagulation. 6. Obesity. 7. Hypertension. Currently hypotensive.     Plan: 1. Discontinue Lasix drip in view of the hypotension. 2. Start dopamine drip. Start intravenous antibiotics empirically in view of the possibility of an infiltrate on the chest x-ray and pneumonia. 3. Nephrology consultation. 4. Pulmonary consultation for ventilator management. 5. Appreciate ongoing cardiology input.. Consultants:  Cardiology, Dr. Diona Browner.   Procedures:  Echocardiogram:    ------------------------------------------------------------ Study Conclusions  -  Left ventricle: The cavity size was normal. There was mild concentric hypertrophy. Systolic function was vigorous. The estimated ejection fraction was in the range of  65% to 70%. Wall motion was normal; there were no regional wall motion abnormalities. The study is not technically sufficient to allow evaluation of LV diastolic function. - Ventricular septum: The contour showed diastolic flattening and systolic flattening consistent with RV pressure and volume overload. - Aortic valve: Mildly calcified annulus. Trileaflet. No significant regurgitation. - Mitral valve: Mildly thickened leaflets . Trivial regurgitation. - Left atrium: The atrium was moderately dilated. - Right ventricle: The cavity size was severely dilated. Systolic function was low normal - appears reduced further in apical views with hypokinesis to akinesis of distal free wall. - Right atrium: The atrium was moderately dilated. - Tricuspid valve: Suspect at least moderate regurgitation with fairly laminar flow noted across tricuspid valve due to incomplete coaptation. Peak RV-RA gradient: 33mm Hg (S). - Pulmonic valve: Mild regurgitation. - Pulmonary arteries: Systolic pressure was likely moderately increased if CVP elevated (could not estimate). - Inferior vena cava: Unable to estimate CVP. - Pericardium, extracardiac: There was no pericardial effusion. Transthoracic echocardiography. M-mode, complete 2D, spectral Doppler, and color Doppler. Height: Height: 152.4cm. Height: 60in. Weight: Weight: 107kg. Weight: 235.5lb. Body mass index: BMI: 46.1kg/m^2. Body surface area: BSA: 3m^2. Patient status: Inpatient. Location: Bedside.   Antibiotics:  None.                   Code Status: Full code.  Family Communication: I've spoken with extended family members today, including her mother and sister and have told them of her serious condition and the possibility of deterioration. They understand and appreciate.  Disposition Plan: Home when medically stable.  Time spent: 20 minutes.   LOS: 3 days   Wilson Singer Pager [phone]  04/17/2013, 7:47 AM

## 2013-04-17 NOTE — Progress Notes (Signed)
At 1435hr patient was ransported to Radiology for Head CT, no complications in route. At 1505hr, patient was returned back to ICU without any complications noted.

## 2013-04-17 NOTE — Progress Notes (Signed)
ET tube pulled back to 23@ lip.

## 2013-04-17 NOTE — Progress Notes (Addendum)
Called by RN as patient was unresponsive.  Upon arrival at bedside she was unresponsive with shallow respirations. She had a pulse. BP was normal per RN. Sats dropped to 70's. Breath sounds not heard.  Ambu bag was initiated. ED physician was called to assist with intubation as patient was not breathing on own.  Sister at bedside was updated.  ABG showed ph: 7.06, PCO2 125.  Patient will be transferred to ICU. ABG will be repeated. Pulm will be consulted. CXR will be obtained. Continue other management as before. Patient is a full code. Order CT head to rule out other reasons for unresponsiveness.  Continue to monitor closely.  Critical care time: 35 mins  Saamiya Jeppsen 04/17/2013 5:14 AM

## 2013-04-17 NOTE — Procedures (Signed)
Central Venous Catheter Insertion Procedure Note Amber Armstrong [id number] 1951/01/21  Procedure: Insertion of Central Venous Catheter Indications: Assessment of intravascular volume, Drug and/or fluid administration and Frequent blood sampling  Procedure Details Consent: Risks of procedure as well as the alternatives and risks of each were explained to the (patient/caregiver).  Consent for procedure obtained. Time Out: Verified patient identification, verified procedure, site/side was marked, verified correct patient position, special equipment/implants available, medications/allergies/relevent history reviewed, required imaging and test results available.  Performed  Maximum sterile technique was used including antiseptics, cap, gloves, gown, hand hygiene, mask and sheet. Skin prep: Chlorhexidine; local anesthetic administered A antimicrobial bonded/coated triple lumen catheter was placed in the right femoral vein due to anticoagulation using the Seldinger technique.  Evaluation Blood flow good Complications: No apparent complications Patient did tolerate procedure well however she was restless during the entire procedure and was not able to lay still for the majority of the procedure.. Chest X-ray ordered to verify placement.  CXR: Not indicated.  Ho Parisi C 04/17/2013, 12:09 PM

## 2013-04-17 NOTE — Progress Notes (Signed)
Patient had 75cc of urine in foley.  Went to do a whole set of vital signs on the patient to notify the MD and noticed the patient was unresponsive.  Tried to arose patient and was unsuccessful.  Patient was Afib on the monitor and her breathing was shallow.  VS at this time was 112/72, 94, 14, 78% on 5L [place].  MD called.

## 2013-04-17 NOTE — Consult Note (Signed)
Reason for Consult: Worsening of renal failure Referring Physician: Dr Dionne Bucy is an 62 y.o. female.  HPI: Patient wheeze history of hypertension, diabetes, a trial fibrillation and previous history of any stone with mild to moderate hydronephrosis since 09/05/2008 presently came with complaints of difficulty in breathing. Which was evaluated she was found to have CHF and elevated BUN and creatinine. While she was in the floor patient become unresponsive and hypoxic respiratory failure. Presently patient is intubated and hence very difficult to get any additional information. Since her BUN and creatinine seems to be increasing from from her admission level consult is obtained.   Past Medical History  Diagnosis Date  . Hypertension   . Type 2 diabetes mellitus     Type II  . Atrial fibrillation   . Nephrolithiasis   . Overweight(278.02)   . UTI (urinary tract infection)   . Hypothyroidism (acquired)     Past Surgical History  Procedure Laterality Date  . Thyroidectomy      Family History  Problem Relation Age of Onset  . Arrhythmia Mother     Atrial fib    Social History:  reports that she has never smoked. She has never used smokeless tobacco. She reports that she does not drink alcohol or use illicit drugs.  Allergies:  Allergies  Allergen Reactions  . Penicillins Rash    Medications: I have reviewed the patient's current medications.  Results for orders placed during the hospital encounter of 04/14/13 (from the past 48 hour(s))  GLUCOSE, CAPILLARY     Status: Abnormal   Collection Time    04/15/13  5:09 PM      Result Value Range   Glucose-Capillary 133 (*) 70 - 99 mg/dL   Comment 1 Notify RN    GLUCOSE, CAPILLARY     Status: Abnormal   Collection Time    04/15/13  9:06 PM      Result Value Range   Glucose-Capillary 183 (*) 70 - 99 mg/dL   Comment 1 Notify RN    PROTIME-INR     Status: Abnormal   Collection Time    04/16/13  5:26 AM      Result  Value Range   Prothrombin Time 24.8 (*) 11.6 - 15.2 seconds   INR 2.37 (*) 0.00 - 1.49  BASIC METABOLIC PANEL     Status: Abnormal   Collection Time    04/16/13  5:26 AM      Result Value Range   Sodium 142  135 - 145 mEq/L   Potassium 5.1  3.5 - 5.1 mEq/L   Chloride 100  96 - 112 mEq/L   CO2 34 (*) 19 - 32 mEq/L   Glucose, Bld 149 (*) 70 - 99 mg/dL   BUN 44 (*) 6 - 23 mg/dL   Creatinine, Ser 4.54 (*) 0.50 - 1.10 mg/dL   Calcium 9.6  8.4 - 09.8 mg/dL   GFR calc non Af Amer 26 (*) >90 mL/min   GFR calc Af Amer 30 (*) >90 mL/min   Comment:            The eGFR has been calculated     using the CKD EPI equation.     This calculation has not been     validated in all clinical     situations.     eGFR's persistently     <90 mL/min signify     possible Chronic Kidney Disease.  GLUCOSE, CAPILLARY  Status: None   Collection Time    04/16/13  4:47 PM      Result Value Range   Glucose-Capillary 97  70 - 99 mg/dL   Comment 1 Documented in Chart     Comment 2 Notify RN    GLUCOSE, CAPILLARY     Status: Abnormal   Collection Time    04/16/13  8:38 PM      Result Value Range   Glucose-Capillary 132 (*) 70 - 99 mg/dL   Comment 1 Notify RN    BLOOD GAS, ARTERIAL     Status: Abnormal   Collection Time    04/17/13  4:45 AM      Result Value Range   FIO2 100.00     Delivery systems MANUAL VENTILATION     pH, Arterial 7.060 (*) 7.350 - 7.450   Comment: CRITICAL RESULT CALLED TO, READ BACK BY AND VERIFIED WITH:     HEARN,J. RN AT 0505 04/17/13 ANDERSON,S. RRT   pCO2 arterial 126.0 (*) 35.0 - 45.0 mmHg   Comment: CRITICAL RESULT CALLED TO, READ BACK BY AND VERIFIED WITH:     HEARN,J.RN 0505 04/17/13 ANDERSON,S RRT   pO2, Arterial 89.5  80.0 - 100.0 mmHg   Bicarbonate 33.9 (*) 20.0 - 24.0 mEq/L   TCO2 33.4  0 - 100 mmol/L   Acid-Base Excess 3.9 (*) 0.0 - 2.0 mmol/L   O2 Saturation 95.6     Patient temperature 37.0     Collection site LEFT RADIAL     Drawn by [id number]     Sample type  ARTERIAL     Allens test (pass/fail) PASS  PASS  PROTIME-INR     Status: Abnormal   Collection Time    04/17/13  4:47 AM      Result Value Range   Prothrombin Time 29.7 (*) 11.6 - 15.2 seconds   INR 3.02 (*) 0.00 - 1.49  BASIC METABOLIC PANEL     Status: Abnormal   Collection Time    04/17/13  4:47 AM      Result Value Range   Sodium 141  135 - 145 mEq/L   Potassium 5.3 (*) 3.5 - 5.1 mEq/L   Chloride 100  96 - 112 mEq/L   CO2 36 (*) 19 - 32 mEq/L   Glucose, Bld 122 (*) 70 - 99 mg/dL   BUN 47 (*) 6 - 23 mg/dL   Creatinine, Ser 1.61 (*) 0.50 - 1.10 mg/dL   Calcium 9.6  8.4 - 09.6 mg/dL   GFR calc non Af Amer 24 (*) >90 mL/min   GFR calc Af Amer 28 (*) >90 mL/min   Comment:            The eGFR has been calculated     using the CKD EPI equation.     This calculation has not been     validated in all clinical     situations.     eGFR's persistently     <90 mL/min signify     possible Chronic Kidney Disease.  CBC WITH DIFFERENTIAL     Status: Abnormal   Collection Time    04/17/13  4:47 AM      Result Value Range   WBC 8.9  4.0 - 10.5 K/uL   RBC 5.21 (*) 3.87 - 5.11 MIL/uL   Hemoglobin 12.9  12.0 - 15.0 g/dL   HCT 04.5 (*) 40.9 - 81.1 %   MCV 89.6  78.0 - 100.0 fL   MCH  24.8 (*) 26.0 - 34.0 pg   MCHC 27.6 (*) 30.0 - 36.0 g/dL   RDW 16.1 (*) 09.6 - 04.5 %   Platelets 317  150 - 400 K/uL   Neutrophils Relative % 68  43 - 77 %   Neutro Abs 6.0  1.7 - 7.7 K/uL   Lymphocytes Relative 4 (*) 12 - 46 %   Lymphs Abs 0.3 (*) 0.7 - 4.0 K/uL   Monocytes Relative 27 (*) 3 - 12 %   Monocytes Absolute 2.4 (*) 0.1 - 1.0 K/uL   Eosinophils Relative 0  0 - 5 %   Eosinophils Absolute 0.0  0.0 - 0.7 K/uL   Basophils Relative 1  0 - 1 %   Basophils Absolute 0.1  0.0 - 0.1 K/uL  HEPATIC FUNCTION PANEL     Status: Abnormal   Collection Time    04/17/13  4:47 AM      Result Value Range   Total Protein 7.8  6.0 - 8.3 g/dL   Albumin 3.4 (*) 3.5 - 5.2 g/dL   AST 25  0 - 37 U/L   ALT 11  0  - 35 U/L   Alkaline Phosphatase 131 (*) 39 - 117 U/L   Total Bilirubin 0.7  0.3 - 1.2 mg/dL   Bilirubin, Direct 0.3  0.0 - 0.3 mg/dL   Indirect Bilirubin 0.4  0.3 - 0.9 mg/dL  GLUCOSE, CAPILLARY     Status: None   Collection Time    04/17/13  7:20 AM      Result Value Range   Glucose-Capillary 95  70 - 99 mg/dL  BLOOD GAS, ARTERIAL     Status: Abnormal   Collection Time    04/17/13  8:15 AM      Result Value Range   FIO2 100.00     Delivery systems VENTILATOR     Mode PRESSURE REGULATED VOLUME CONTROL     VT 370     Rate 20     Peep/cpap 5.0     pH, Arterial 7.372  7.350 - 7.450   pCO2 arterial 54.7 (*) 35.0 - 45.0 mmHg   pO2, Arterial 128.0 (*) 80.0 - 100.0 mmHg   Bicarbonate 31.0 (*) 20.0 - 24.0 mEq/L   TCO2 28.2  0 - 100 mmol/L   Acid-Base Excess 5.9 (*) 0.0 - 2.0 mmol/L   O2 Saturation 99.1     Patient temperature 37.0     Collection site LEFT RADIAL     Drawn by COLLECTED BY RT     Sample type ARTERIAL     Allens test (pass/fail) PASS  PASS    Portable Chest Xray  04/17/2013   *RADIOLOGY REPORT*  Clinical Data: ET tube placement.  PORTABLE CHEST - 1 VIEW  Comparison: 04/14/2013  Findings: Endotracheal tube has been placed and is at the level of the carina directed toward the right main stem bronchus.  Recommend retracting 3 cm.  Cardiomegaly with vascular congestion and mild perihilar and lower lobe opacities.  No visible effusions.  No acute bony abnormality.  IMPRESSION: Endotracheal tube at the level of the carina directed toward the right mainstem bronchus.  Recommend retracting 3 cm.  Cardiomegaly.  Bilateral perihilar and lower lobe airspace opacities, likely edema.   Original Report Authenticated By: Charlett Nose, M.D.   Dg Chest Port 1v Same Day  04/17/2013   *RADIOLOGY REPORT*  Clinical Data: Nasogastric tube placement.  PORTABLE CHEST - 1 VIEW SAME DAY  Comparison: 04/17/2013.  Findings:  Nasogastric tube is followed into the stomach. Endotracheal tube terminates at  the orifice of the right mainstem bronchus.  Heart appears enlarged, stable.  Bibasilar dependent air space disease appears slightly progressive.  IMPRESSION:  1.  Endotracheal tube is at the wall of the right mainstem bronchus.  Retracting approximately 3 cm would better position the tip above the carina. These results will be called to the ordering clinician or representative by the Radiologist Assistant, and communication documented in the PACS Dashboard. 2.  Nasogastric tube is followed into the stomach. 3.  Bibasilar dependent air space disease, worsening.   Original Report Authenticated By: Leanna Battles, M.D.    Review of Systems  Unable to perform ROS  Blood pressure 75/54, pulse 82, temperature 97.7 F (36.5 C), temperature source Axillary, resp. rate 15, height 5' (1.524 m), weight 108.5 kg (239 lb 3.2 oz), SpO2 100.00%. Physical Exam  Eyes: No scleral icterus.  Neck: JVD present.  Cardiovascular:  No murmur heard. Irrigular rate and rythm  Respiratory: She has wheezes.  Patient intubated  GI: She exhibits no distension. There is no tenderness.  Musculoskeletal: She exhibits edema.  Neurological: She is alert.    Assessment/Plan: Problem #1 acute kidney injury possibly superimposed to chronic. As stated above patient wheeze previous history of kidney stone and mild to moderate hydronephrosis of her right kidney. 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 Presently other etiologies such as diabetic nephropathy and hypertensive nephrosclerosis cannot ruled out. The present increasing BUN and creatinine is most likely secondary to removal. Problem #2 history of CHF/anasarca patient is on Lasix was good urine output. Presently patient still has some edema. Problem #3 respiratory failure. Chest x-ray showed bibasilar airspace disease which seems to be worsening. This could be a combination of CHF and possible also bilateral pneumonia. He Problem  #4 after fibrillation her heart rate is controlled Problem #5 obesity Problem #6 history of right lateral and ventricular dilatation with possible pulmonary hypertension and right-sided heart failure. Problem #7 history of nephrolithiasis with CT scan on 09/05/2008 showing a calculus in the right kidney. Right kidney was 11.4 cm and left kidney 10.7. As this moment or sure whether patient has past kidney stone. Problem #8 hypertension. Presently her blood pressure seems to be somewhat low. Plan: Agree with Lasix drip. Presently patient was good urine output, low but stable blood pressure. We'll check ultrasound of her kidneys. We'll follow her basic metabolic panel, phosphorus and CBC in the morning. We'll check her urinalysis and patient has proteinuria we'll do further workup.  Louana Fontenot S 04/17/2013, 8:52 AM

## 2013-04-17 NOTE — Progress Notes (Signed)
Rapid response called.  I was on the phone at this time updating the MD.  MD on the way.

## 2013-04-18 ENCOUNTER — Inpatient Hospital Stay (HOSPITAL_COMMUNITY): Payer: BC Managed Care – PPO

## 2013-04-18 DIAGNOSIS — I10 Essential (primary) hypertension: Secondary | ICD-10-CM

## 2013-04-18 DIAGNOSIS — E119 Type 2 diabetes mellitus without complications: Secondary | ICD-10-CM

## 2013-04-18 LAB — GLUCOSE, CAPILLARY
Glucose-Capillary: 108 mg/dL — ABNORMAL HIGH (ref 70–99)
Glucose-Capillary: 101 mg/dL — ABNORMAL HIGH (ref 70–99)
Glucose-Capillary: 135 mg/dL — ABNORMAL HIGH (ref 70–99)
Glucose-Capillary: 110 mg/dL — ABNORMAL HIGH (ref 70–99)
Glucose-Capillary: 63 mg/dL — ABNORMAL LOW (ref 70–99)
Glucose-Capillary: 129 mg/dL — ABNORMAL HIGH (ref 70–99)
Glucose-Capillary: 120 mg/dL — ABNORMAL HIGH (ref 70–99)

## 2013-04-18 LAB — CBC
HCT: 36.9 % (ref 36.0–46.0)
Hemoglobin: 11.3 g/dL — ABNORMAL LOW (ref 12.0–15.0)
MCV: 80.7 fL (ref 78.0–100.0)
RDW: 17.3 % — ABNORMAL HIGH (ref 11.5–15.5)
WBC: 8.5 10*3/uL (ref 4.0–10.5)

## 2013-04-18 LAB — URINE CULTURE: Colony Count: NO GROWTH

## 2013-04-18 LAB — PHOSPHORUS: Phosphorus: 1.6 mg/dL — ABNORMAL LOW (ref 2.3–4.6)

## 2013-04-18 LAB — BASIC METABOLIC PANEL
CO2: 42 mEq/L (ref 19–32)
Chloride: 101 mEq/L (ref 96–112)
Creatinine, Ser: 1.76 mg/dL — ABNORMAL HIGH (ref 0.50–1.10)
GFR calc Af Amer: 34 mL/min — ABNORMAL LOW (ref >90)
Potassium: 3.1 mEq/L — ABNORMAL LOW (ref 3.5–5.1)

## 2013-04-18 MED ORDER — DEXTROSE 50 % IV SOLN: 25.0000 mL | Freq: Once | INTRAVENOUS | Status: AC | PRN

## 2013-04-18 MED ORDER — LEVOTHYROXINE SODIUM 200 MCG IV SOLR
100.0000 ug | INTRAVENOUS | Status: DC
Start: 2013-04-18 — End: 2013-04-25
  Administered 2013-04-18 – 2013-04-24 (×7): 100 ug via INTRAVENOUS
  Filled 2013-04-18 (×10): qty 5

## 2013-04-18 MED ORDER — METOPROLOL TARTRATE 1 MG/ML IV SOLN
5.0000 mg | Freq: Four times a day (QID) | INTRAVENOUS | Status: DC
  Administered 2013-04-18 – 2013-04-19 (×7): 5 mg via INTRAVENOUS
  Filled 2013-04-18 (×8): qty 5

## 2013-04-18 MED ORDER — LEVOTHYROXINE SODIUM 200 MCG IV SOLR: 200.0000 ug | Freq: Every day | INTRAVENOUS | Status: DC

## 2013-04-18 MED ORDER — FUROSEMIDE 10 MG/ML IJ SOLN
40.0000 mg | Freq: Two times a day (BID) | INTRAMUSCULAR | Status: DC
  Administered 2013-04-18 – 2013-04-19 (×4): 40 mg via INTRAVENOUS
  Filled 2013-04-18 (×6): qty 4

## 2013-04-18 MED ORDER — ENOXAPARIN SODIUM 100 MG/ML ~~LOC~~ SOLN
100.0000 mg | Freq: Two times a day (BID) | SUBCUTANEOUS | Status: DC
  Administered 2013-04-18 – 2013-04-19 (×3): 100 mg via SUBCUTANEOUS
  Filled 2013-04-18 (×4): qty 1

## 2013-04-18 MED ORDER — POTASSIUM CHLORIDE 2 MEQ/ML IV SOLN
INTRAVENOUS | Status: DC
  Administered 2013-04-18 (×2): via INTRAVENOUS
  Filled 2013-04-18 (×3): qty 1000

## 2013-04-18 MED ORDER — POTASSIUM PHOSPHATE DIBASIC 3 MMOLE/ML IV SOLN
30.0000 mmol | Freq: Once | INTRAVENOUS | Status: AC
  Administered 2013-04-18: 30 mmol via INTRAVENOUS
  Filled 2013-04-18: qty 10

## 2013-04-18 MED ORDER — DEXTROSE 50 % IV SOLN
INTRAVENOUS | Status: AC
  Administered 2013-04-18: 50 mL
  Filled 2013-04-18: qty 50

## 2013-04-18 MED ORDER — DEXTROSE 10 % IV SOLN
INTRAVENOUS | Status: DC
  Administered 2013-04-18: 04:00:00 via INTRAVENOUS

## 2013-04-18 MED FILL — Medication: Qty: 1 | Status: AC

## 2013-04-18 NOTE — Progress Notes (Addendum)
Amber Armstrong ZOX:[id number] DOB: Jun 05, 1951 DOA: 04/14/2013 PCP: Pershing Proud   Subjective: This lady was admitted with congestive heart failure, mainly right heart failure. Unfortunately, she decompensated and became  unresponsive. She is now on a ventilator.  She has done better overnight, has had a significant diuresis in last 24 hours,  remains on the ventilator.           Physical Exam: Blood pressure 107/52, pulse 94, temperature 99.4 F (37.4 C), temperature source Axillary, resp. rate 20, height 5' (1.524 m), weight 101 kg (222 lb 10.6 oz), SpO2 97.00%. Obese. Now very minimal peripheral pitting edema in her legs significantly less than yesterday. Lung fields are relatively clear today.Marland Kitchen Heart sounds are present and irregular with ventricular rate that's somewhat slower. She is on the ventilator but appears to be alert..   Investigations:     Basic Metabolic Panel:  Recent Labs  81/19/14 0447 04/18/13 0430  NA 141 147*  K 5.3* 3.1*  CL 100 101  CO2 36* 42*  GLUCOSE 122* 97  BUN 47* 44*  CREATININE 2.09* 1.76*  CALCIUM 9.6 8.8  PHOS  --  1.6*       CBC:  Recent Labs  04/17/13 0447 04/18/13 0430  WBC 8.9 8.5  NEUTROABS 6.0  --   HGB 12.9 11.3*  HCT 46.7* 36.9  MCV 89.6 80.7  PLT 317 209    Ct Head Wo Contrast  04/17/2013   *RADIOLOGY REPORT*  Clinical Data: Hypertension.  Diabetes.  Atrial fibrillation. Unresponsive.  CT HEAD WITHOUT CONTRAST  Technique:  Contiguous axial images were obtained from the base of the skull through the vertex without contrast.  Comparison: None.  Findings: There is no evidence of old or acute focal infarction. No mass lesion, hemorrhage, hydrocephalus or extra-axial collection.  The calvarium is unremarkable.  There are these mucosal inflammation and within the paranasal sinuses.  IMPRESSION: No acute or focal brain pathology.  Normal appearing brain for age by CT.  Some inflammatory change of the paranasal  sinuses.   Original Report Authenticated By: Paulina Fusi, M.D.   US Renal  04/17/2013   *RADIOLOGY REPORT*  Clinical Data: 62 year old female with renal failure.  The patient is unresponsive.  RENAL/URINARY TRACT ULTRASOUND COMPLETE  Comparison:  None.  Findings:  Right Kidney:  No hydronephrosis.  Renal cortical thinning.  Renal length 8.4 cm.  Left Kidney:  Difficult to visualize.  No definite hydronephrosis. Approximate renal length 9.3 cm.  Bladder:  Decompressed and Foley catheter balloon visible.  Other findings:  Evidence of ascites in the pelvis.  IMPRESSION: 1.  No definite acute renal findings.  Right renal cortical atrophy.  The left kidney is difficult to visualize. 2.  Ascites in the pelvis.   Original Report Authenticated By: Erskine Speed, M.D.   Portable Chest Xray  04/17/2013   *RADIOLOGY REPORT*  Clinical Data: ET tube placement.  PORTABLE CHEST - 1 VIEW  Comparison: 04/14/2013  Findings: Endotracheal tube has been placed and is at the level of the carina directed toward the right main stem bronchus.  Recommend retracting 3 cm.  Cardiomegaly with vascular congestion and mild perihilar and lower lobe opacities.  No visible effusions.  No acute bony abnormality.  IMPRESSION: Endotracheal tube at the level of the carina directed toward the right mainstem bronchus.  Recommend retracting 3 cm.  Cardiomegaly.  Bilateral perihilar and lower lobe airspace opacities, likely edema.   Original Report Authenticated By: Charlett Nose, M.D.   Dg  Chest Port 1v Same Day  04/17/2013   *RADIOLOGY REPORT*  Clinical Data: Nasogastric tube placement.  PORTABLE CHEST - 1 VIEW SAME DAY  Comparison: 04/17/2013.  Findings: Nasogastric tube is followed into the stomach. Endotracheal tube terminates at the orifice of the right mainstem bronchus.  Heart appears enlarged, stable.  Bibasilar dependent air space disease appears slightly progressive.  IMPRESSION:  1.  Endotracheal tube is at the wall of the right mainstem  bronchus.  Retracting approximately 3 cm would better position the tip above the carina. These results will be called to the ordering clinician or representative by the Radiologist Assistant, and communication documented in the PACS Dashboard. 2.  Nasogastric tube is followed into the stomach. 3.  Bibasilar dependent air space disease, worsening.   Original Report Authenticated By: Leanna Battles, M.D.      Medications: I have reviewed the patient's current medications.  Impression: 1. Acute on chronic respiratory failure, multifactorial. I think she clearly has obesity hypoventilation syndrome, and now predominantly right-sided congestive heart failure and possible pneumonia. Improving. 2. Atrial fibrillation with slow ventricular response. 3. Diabetes mellitus type 2. 4. Acute renal failure in the setting of #1. Improving. 5. Chronic anticoagulation. 6. Obesity. 7. Hypertension.  8. Hypokalemia and hypernatremia.     Plan: 1. Reduce intravenous Lasix to 40 mg IV every 12 hours. 2. Continue dopamine drip and consider reducing. Continue with intravenous antibiotics. 3. Replete potassium. 4. Repeat chest x-ray this morning. 5. Consider weaning off the ventilator, I will defer this to Dr. Juanetta Gosling, pulmonology. 6. Full dose Lovenox anticoagulation per pharmacy whilst the patient is off warfarin.   Consultants:  Cardiology, Dr. Diona Browner.   Procedures:  Echocardiogram:    ------------------------------------------------------------ Study Conclusions  - Left ventricle: The cavity size was normal. There was mild concentric hypertrophy. Systolic function was vigorous. The estimated ejection fraction was in the range of 65% to 70%. Wall motion was normal; there were no regional wall motion abnormalities. The study is not technically sufficient to allow evaluation of LV diastolic function. - Ventricular septum: The contour showed diastolic flattening and systolic flattening  consistent with RV pressure and volume overload. - Aortic valve: Mildly calcified annulus. Trileaflet. No significant regurgitation. - Mitral valve: Mildly thickened leaflets . Trivial regurgitation. - Left atrium: The atrium was moderately dilated. - Right ventricle: The cavity size was severely dilated. Systolic function was low normal - appears reduced further in apical views with hypokinesis to akinesis of distal free wall. - Right atrium: The atrium was moderately dilated. - Tricuspid valve: Suspect at least moderate regurgitation with fairly laminar flow noted across tricuspid valve due to incomplete coaptation. Peak RV-RA gradient: 33mm Hg (S). - Pulmonic valve: Mild regurgitation. - Pulmonary arteries: Systolic pressure was likely moderately increased if CVP elevated (could not estimate). - Inferior vena cava: Unable to estimate CVP. - Pericardium, extracardiac: There was no pericardial effusion. Transthoracic echocardiography. M-mode, complete 2D, spectral Doppler, and color Doppler. Height: Height: 152.4cm. Height: 60in. Weight: Weight: 107kg. Weight: 235.5lb. Body mass index: BMI: 46.1kg/m^2. Body surface area: BSA: 28m^2. Patient status: Inpatient. Location: Bedside.   Antibiotics:  None.                   Code Status: Full code.  Family Communication: Updated family members at the bedside regarding patient's condition.  Disposition Plan: Home when medically stable.  Time spent: 20 minutes.   LOS: 4 days   Wilson Singer Pager [phone]  04/18/2013, 7:26 AM

## 2013-04-18 NOTE — Progress Notes (Signed)
E-link MD updated on co2=42. Previous co2=46.7

## 2013-04-18 NOTE — Progress Notes (Signed)
Hypoglycemic Event  CBG:59  Treatment: 25cc of D50     Follow-up CBG: Time:0030 CBG Result93    Comments/MD notifiedDr Josph Macho, Rochele Raring  Remember to initiate Hypoglycemia Order Set & complete

## 2013-04-18 NOTE — Progress Notes (Signed)
SUBJECTIVE:Intubated. Awake and responsive. Denies chest pain.Weaning parameters for vent.    DIABETES MELLITUS, TYPE II   HYPERLIPIDEMIA   Morbid obesity with BMI of 40.0-44.9   HYPERTENSION   FIBRILLATION, ATRIAL   Long term current use of anticoagulant   CHF exacerbation   Acute respiratory failure   Acute renal failure   Acute diastolic heart failure  LABS: Basic Metabolic Panel:  Recent Labs  91/47/82 0447 04/18/13 0430  NA 141 147*  K 5.3* 3.1*  CL 100 101  CO2 36* 42*  GLUCOSE 122* 97  BUN 47* 44*  CREATININE 2.09* 1.76*  CALCIUM 9.6 8.8  PHOS  --  1.6*   Liver Function Tests:  Recent Labs  04/17/13 0447  AST 25  ALT 11  ALKPHOS 131*  BILITOT 0.7  PROT 7.8  ALBUMIN 3.4*   CBC:  Recent Labs  04/17/13 0447 04/18/13 0430  WBC 8.9 8.5  NEUTROABS 6.0  --   HGB 12.9 11.3*  HCT 46.7* 36.9  MCV 89.6 80.7  PLT 317 209   RADIOLOGY: Ct Head Wo Contrast  04/17/2013   No acute or focal brain pathology.  Normal appearing brain for age by CT.  Some inflammatory change of the paranasal sinuses.    US Renal  04/17/2013     1.  No definite acute renal findings.  Right renal cortical atrophy.  The left kidney is difficult to visualize. 2.  Ascites in the pelvis.    Portable Chest Xray   Endotracheal tube at the level of the carina directed toward the right mainstem bronchus.  Recommend retracting 3 cm.  Cardiomegaly.  Bilateral perihilar and lower lobe airspace opacities, likely edema.  PHYSICAL EXAM BP 107/52  Pulse 94  Temp(Src) 99.2 F (37.3 C) (Axillary)  Resp 20  Ht 5' (1.524 m)  Wt 222 lb 10.6 oz (101 kg)  BMI 43.49 kg/m2  SpO2 97% WT Loss 17 lbs overnight, (?) total wt loss from highest recorded.   General: Well developed, well nourished, in no acute distress Head: Eyes PERRLA, No xanthomas.   Normal cephalic and atramatic  Lungs: Clear bilaterally to auscultation, on vent without coarse breath sounds.. Heart: HRIR tachycardic S1 S2, No MRG .   Pulses are 2+ & equal.            No carotid bruit. No JVD.  No abdominal bruits. No femoral bruits. Abdomen: Bowel sounds are positive, abdomen soft and non-tender without masses or                  Hernia's noted. Msk:  Back normal, normal gait. Normal strength and tone for age. Extremities: No clubbing, cyanosis, NO edema.  DP +1 Neuro: Alert and oriented X 3. Psych:  Good affect, responds appropriately  TELEMETRY: Reviewed telemetry pt in: Atrial fibrillation rates in  The 100-120 bpm.  ASSESSMENT AND PLAN:  1. Acute on Chronic Right sided CHF: She has continued to diurese with addition of low dose dopamine and change from IV lasix drip to IV lasix bolus of 40 mg IV BID. Hypokalemia present this morning-IV replacement initiated. Wt loss of 17 lbs total from admission, with most dramatic since being in ICU. Creatinine improved from 2.0 to 1.76. Continue IV diureses.   2.Atrial fibrillation: Heart rate is elevated this am. May be combination of dopamine and vent weaning. Now only on IV metoprolol 5 mg IV Q 6 hours. Will try and wean dopamine once extubated.Continues on anticoagulation with SQ LMWH.  3.  VDRF: Related to hypoventilation syndrome. Will most likely require CPAP after extubation. Vent management per Dr.Hawkins.  Bettey Mare. Lyman Bishop NP [facility] 04/18/2013, 8:16 AM  Cardiology Attending Patient interviewed and examined. Discussed with Joni Reining, NP.  Above note annotated and modified based upon my findings.  Creatinine decreasing despite diuresis suggesting that renal dysfunction is related to congestive heart failure.  She is fully anticoagulated with enoxaparin, the dose of which has not been adjusted for renal dysfunction.  She is now off dopamine with good blood pressure and reasonable heart rate. Plan is for extubation in the morning.  She is receiving essentially no AV nodal blocking medication, but was on fairly high dose metoprolol + diltiazem therapy  prior to admission.  Denton Bing, MD 04/18/2013, 5:42 PM

## 2013-04-18 NOTE — Progress Notes (Signed)
Hypoglycemic Event  CBG:63  Treatment: 25cc D50  Symptoms: none  Follow-up CBG: Time:0400 CBG Result:110  Possible Reasons for Event: NPO  Comments/MD notified:E-link MD(see orders)    Royetta Crochet  Remember to initiate Hypoglycemia Order Set & complete

## 2013-04-18 NOTE — Progress Notes (Signed)
eLink Physician-Brief Progress Note Patient Name: Amber Armstrong DOB: 03-24-1951 MRN: [id number]  Date of Service  04/18/2013   HPI/Events of Note   Hypokalemia Hypophosphatemia    eICU Interventions   KPhos 30 mmol IV x 1   Intervention Category Intermediate Interventions: Electrolyte abnormality - evaluation and management  Jamale Spangler 04/18/2013, 6:32 AM

## 2013-04-18 NOTE — Progress Notes (Signed)
Subjective: Interval History: none.  Objective: Vital signs in last 24 hours: Temp:  [98.3 F (36.8 C)-99.6 F (37.6 C)] 99.2 F (37.3 C) (06/26 0730) Pulse Rate:  [82-108] 94 (06/26 0523) Resp:  [19-25] 20 (06/26 0523) BP: (97-128)/(46-97) 107/52 mmHg (06/26 0400) SpO2:  [96 %-100 %] 97 % (06/26 0523) FiO2 (%):  [40 %-80.1 %] 40 % (06/26 0840) Weight:  [101 kg (222 lb 10.6 oz)] 101 kg (222 lb 10.6 oz) (06/26 0500) Weight change: -7.5 kg (-16 lb 8.6 oz)  Intake/Output from previous day: 06/25 0701 - 06/26 0700 In: 958.3 [I.V.:308.3; IV Piggyback:650] Out: 6050 [Urine:6050] Intake/Output this shift:    Generally patient is sedated and is unable to get any additional history Chest decreased breath sound bilaterally and anteriorly Heart irregular rate and rhythm no murmur Abdomen soft positive bowel sound Extremities she has trace lower extremity edema however she has significant upper extremity edema.   Lab Results:  Recent Labs  04/17/13 0447 04/18/13 0430  WBC 8.9 8.5  HGB 12.9 11.3*  HCT 46.7* 36.9  PLT 317 209   BMET:  Recent Labs  04/17/13 0447 04/18/13 0430  NA 141 147*  K 5.3* 3.1*  CL 100 101  CO2 36* 42*  GLUCOSE 122* 97  BUN 47* 44*  CREATININE 2.09* 1.76*  CALCIUM 9.6 8.8   No results found for this basename: PTH,  in the last 72 hours Iron Studies: No results found for this basename: IRON, TIBC, TRANSFERRIN, FERRITIN,  in the last 72 hours  Studies/Results: Ct Head Wo Contrast  04/17/2013   *RADIOLOGY REPORT*  Clinical Data: Hypertension.  Diabetes.  Atrial fibrillation. Unresponsive.  CT HEAD WITHOUT CONTRAST  Technique:  Contiguous axial images were obtained from the base of the skull through the vertex without contrast.  Comparison: None.  Findings: There is no evidence of old or acute focal infarction. No mass lesion, hemorrhage, hydrocephalus or extra-axial collection.  The calvarium is unremarkable.  There are these mucosal inflammation and  within the paranasal sinuses.  IMPRESSION: No acute or focal brain pathology.  Normal appearing brain for age by CT.  Some inflammatory change of the paranasal sinuses.   Original Report Authenticated By: Paulina Fusi, M.D.   US Renal  04/17/2013   *RADIOLOGY REPORT*  Clinical Data: 62 year old female with renal failure.  The patient is unresponsive.  RENAL/URINARY TRACT ULTRASOUND COMPLETE  Comparison:  None.  Findings:  Right Kidney:  No hydronephrosis.  Renal cortical thinning.  Renal length 8.4 cm.  Left Kidney:  Difficult to visualize.  No definite hydronephrosis. Approximate renal length 9.3 cm.  Bladder:  Decompressed and Foley catheter balloon visible.  Other findings:  Evidence of ascites in the pelvis.  IMPRESSION: 1.  No definite acute renal findings.  Right renal cortical atrophy.  The left kidney is difficult to visualize. 2.  Ascites in the pelvis.   Original Report Authenticated By: Erskine Speed, M.D.   Portable Chest Xray  04/17/2013   *RADIOLOGY REPORT*  Clinical Data: ET tube placement.  PORTABLE CHEST - 1 VIEW  Comparison: 04/14/2013  Findings: Endotracheal tube has been placed and is at the level of the carina directed toward the right main stem bronchus.  Recommend retracting 3 cm.  Cardiomegaly with vascular congestion and mild perihilar and lower lobe opacities.  No visible effusions.  No acute bony abnormality.  IMPRESSION: Endotracheal tube at the level of the carina directed toward the right mainstem bronchus.  Recommend retracting 3 cm.  Cardiomegaly.  Bilateral perihilar and lower lobe airspace opacities, likely edema.   Original Report Authenticated By: Charlett Nose, M.D.   Dg Chest Port 1v Same Day  04/17/2013   *RADIOLOGY REPORT*  Clinical Data: Nasogastric tube placement.  PORTABLE CHEST - 1 VIEW SAME DAY  Comparison: 04/17/2013.  Findings: Nasogastric tube is followed into the stomach. Endotracheal tube terminates at the orifice of the right mainstem bronchus.  Heart appears  enlarged, stable.  Bibasilar dependent air space disease appears slightly progressive.  IMPRESSION:  1.  Endotracheal tube is at the wall of the right mainstem bronchus.  Retracting approximately 3 cm would better position the tip above the carina. These results will be called to the ordering clinician or representative by the Radiologist Assistant, and communication documented in the PACS Dashboard. 2.  Nasogastric tube is followed into the stomach. 3.  Bibasilar dependent air space disease, worsening.   Original Report Authenticated By: Leanna Battles, M.D.    I have reviewed the patient's current medications.  Assessment/Plan: Problem #1 acute kidney injury superimposed on chronic her BUN and creatinine presently seems to be improving. Patient ultrasound showed bilaterally small and echogenic kidneys. 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 Presently she doesn't have any hydro nephrosis or kidney stone. Problem #2 CHF patient on Lasix was good urine output. Problem #3 hypokalemia related most likely to her diuretics. Problem #4 diabetes Problem #5 a trial fibrillation her heart rate is controlled Problem #6 respiratory failure possibly combination of CHF and also pneumonia. Patient is presently remains intubated. Problem #7 hyperlipidemia Problem #8 obesity Problem #9 hypertension presently patient blood pressure seems to be low normal. Problem #10 hypernatremia most likely secondary to lack of free water and have normal saline. Plan: We'll start her on D5 water with 40 mEq of KCl at 75 cc per hour after she finished the potassium  she's getting now.    LOS: 4 days   Amber Armstrong S 04/18/2013,9:16 AM

## 2013-04-18 NOTE — Progress Notes (Signed)
ANTICOAGULATION CONSULT NOTE   Pharmacy Consult for Lovenox Indication: atrial fibrillation  Allergies  Allergen Reactions  . Penicillins Rash   Patient Measurements: Height: 5' (152.4 cm) Weight: 222 lb 10.6 oz (101 kg) IBW/kg (Calculated) : 45.5  Vital Signs: Temp: 99.2 F (37.3 C) (06/26 0730) Temp src: Axillary (06/26 0730) BP: 107/52 mmHg (06/26 0400) Pulse Rate: 94 (06/26 0523)  Labs:  Recent Labs  04/16/13 0526 04/17/13 0447 04/17/13 1210 04/18/13 0430  HGB  --  12.9  --  11.3*  HCT  --  46.7*  --  36.9  PLT  --  317  --  209  LABPROT 24.8* 29.7* 32.7* 21.4*  INR 2.37* 3.02* 3.35* 1.92*  CREATININE 1.97* 2.09*  --  1.76*    Estimated Creatinine Clearance: 35 ml/min (by C-G formula based on Cr of 1.76).   Medical History: Past Medical History  Diagnosis Date  . Hypertension   . Type 2 diabetes mellitus     Type II  . Atrial fibrillation   . Nephrolithiasis   . Overweight(278.02)   . UTI (urinary tract infection)   . Hypothyroidism (acquired)     Medications:  Scheduled:  . antiseptic oral rinse  15 mL Mouth Rinse QID  . bimatoprost  1 drop Both Eyes QHS  . chlorhexidine  15 mL Mouth Rinse BID  . enoxaparin (LOVENOX) injection  100 mg Subcutaneous Q12H  . furosemide  40 mg Intravenous BID  . insulin aspart  2-6 Units Subcutaneous Q4H  . levofloxacin (LEVAQUIN) IV  750 mg Intravenous Q48H  . Levothyroxine Sodium  100 mcg Intravenous Q24H  . metoprolol  5 mg Intravenous Q6H  . nystatin   Topical BID  . pantoprazole (PROTONIX) IV  40 mg Intravenous Daily  . potassium phosphate IVPB (mmol)  30 mmol Intravenous Once  . vancomycin  1,500 mg Intravenous Q24H  . Warfarin - Pharmacist Dosing Inpatient   Does not apply Q24H   Assessment: 62 yo F on chronic warfarin 2.5mg  daily except 5mg  on Wed for Afib. INR was therapeutic on admission & has been stable ~2.3, however large jump in INR overnight to high end of goal range and MD ordered Vit K 5mg  sq.   INR is now <2.   Discussed with Dr. Karilyn Cota- plan to hold Coumadin & start sq Lovenox while patient intubated/acutely ill.   Renal function has returned to baseline (24h I/O -5L). No bleeding noted, however she did have a decline in platelet count.  Will monitor closely.   Goal of Therapy:  INR 2-3   Plan:  Hold Coumadin Lovenox 100mg  sq q12h CBC on MWF INR daily  Elson Clan 04/18/2013,8:08 AM

## 2013-04-18 NOTE — Progress Notes (Signed)
INITIAL NUTRITION ASSESSMENT  DOCUMENTATION CODES Per approved criteria  -Obesity Class III   INTERVENTION: IF unable to wean recommend: Initiate continuous Vital 1.2 @ 30 ml/hr via NGT. Add 30 ml Prostat BID.  Regimen will provide: 1064 kcal, 84 gr protein and 583 ml water.   NUTRITION DIAGNOSIS: Inadequate oral intake related to inability to eat as evidenced by NPO status.  Goal: Enteral nutrition to provide 60-70% of estimated calorie needs (22-25 kcals/kg ideal body weight) and 100% of estimated protein needs, based on ASPEN guidelines for permissive underfeeding in critically ill obese individuals.  Monitor:  Nutrition care plan progression  Reason for Assessment: mechanical ventilation   62 y.o. female  Admitting Dx: acute respiratory failure  ASSESSMENT: Patient is currently intubated on ventilator support.  MV: 7.9 ml Temp:Temp (24hrs), Avg:99.2 F (37.3 C), Min:98.7 F (37.1 C), Max:99.6 F (37.6 C)  IVF-D5 with potassium chloride 40 mEq @ 75 ml/hr  Height: Ht Readings from Last 1 Encounters:  04/17/13 5' (1.524 m)    Weight: Wt Readings from Last 1 Encounters:  04/18/13 222 lb 10.6 oz (101 kg)    Ideal Body Weight: 100# (45.4 kg)  % Ideal Body Weight: 222%  Wt Readings from Last 10 Encounters:  04/18/13 222 lb 10.6 oz (101 kg)  12/12/12 202 lb 1.3 oz (91.663 kg)  04/11/12 204 lb (92.534 kg)  03/28/12 205 lb (92.987 kg)  04/19/11 201 lb (91.173 kg)  09/23/10 201 lb (91.173 kg)  06/23/10 201 lb (91.173 kg)  06/14/10 199 lb (90.266 kg)  12/10/09 206 lb (93.441 kg)    Usual Body Weight: 200-205#  % Usual Body Weight: 109%  BMI:  Body mass index is 43.49 kg/(m^2). Extreme Obesity Class III (skewed due to pt fluid status)  Estimated Nutritional Needs: Kcal: 1647 (161-0960 kcal/day to meet 60-70% of est needs) Protein: 90-112 gr Fluid: per MD goals  Skin: No issues noted  Diet Order: NPO  EDUCATION NEEDS: -No education needs identified  at this time   Intake/Output Summary (Last 24 hours) at 04/18/13 1213 Last data filed at 04/18/13 0953  Gross per 24 hour  Intake 1968.33 ml  Output   6050 ml  Net -4081.67 ml    Last BM:   Labs:   Recent Labs Lab 04/16/13 0526 04/17/13 0447 04/18/13 0430  NA 142 141 147*  K 5.1 5.3* 3.1*  CL 100 100 101  CO2 34* 36* 42*  BUN 44* 47* 44*  CREATININE 1.97* 2.09* 1.76*  CALCIUM 9.6 9.6 8.8  PHOS  --   --  1.6*  GLUCOSE 149* 122* 97    CBG (last 3)   Recent Labs  04/18/13 0414 04/18/13 0733 04/18/13 1112  GLUCAP 110* 84 108*    Scheduled Meds: . antiseptic oral rinse  15 mL Mouth Rinse QID  . bimatoprost  1 drop Both Eyes QHS  . chlorhexidine  15 mL Mouth Rinse BID  . enoxaparin (LOVENOX) injection  100 mg Subcutaneous Q12H  . furosemide  40 mg Intravenous BID  . insulin aspart  2-6 Units Subcutaneous Q4H  . levofloxacin (LEVAQUIN) IV  750 mg Intravenous Q48H  . Levothyroxine Sodium  100 mcg Intravenous Q24H  . metoprolol  5 mg Intravenous Q6H  . nystatin   Topical BID  . pantoprazole (PROTONIX) IV  40 mg Intravenous Daily  . potassium phosphate IVPB (mmol)  30 mmol Intravenous Once  . vancomycin  1,500 mg Intravenous Q24H    Continuous Infusions: . dextrose 5 %  with kcl 75 mL/hr at 04/18/13 1017  . DOPamine      Past Medical History  Diagnosis Date  . Hypertension   . Type 2 diabetes mellitus     Type II  . Atrial fibrillation   . Nephrolithiasis   . Overweight(278.02)   . UTI (urinary tract infection)   . Hypothyroidism (acquired)     Past Surgical History  Procedure Laterality Date  . Thyroidectomy      Royann Shivers MS,RD,LDN,CSG Office: [phone] Pager: [phone]

## 2013-04-18 NOTE — Progress Notes (Signed)
eLink Physician-Brief Progress Note Patient Name: Amber Armstrong DOB: 1951/04/06 MRN: [id number]  Date of Service  04/18/2013   HPI/Events of Note   Hypoglycemia  eICU Interventions   D5 replaced with D10 @ 50 mL/h   Intervention Category Major Interventions: Other:  [name] 04/18/2013, 3:59 AM

## 2013-04-18 NOTE — Progress Notes (Signed)
Subjective: She remains intubated and on the ventilator. She is responsive.  Objective: Vital signs in last 24 hours: Temp:  [98.3 F (36.8 C)-99.6 F (37.6 C)] 99.2 F (37.3 C) (06/26 0730) Pulse Rate:  [82-108] 94 (06/26 0523) Resp:  [15-25] 20 (06/26 0523) BP: (75-128)/(46-97) 107/52 mmHg (06/26 0400) SpO2:  [96 %-100 %] 97 % (06/26 0523) FiO2 (%):  [59.6 %-100 %] 60 % (06/26 0523) Weight:  [101 kg (222 lb 10.6 oz)] 101 kg (222 lb 10.6 oz) (06/26 0500) Weight change: -7.5 kg (-16 lb 8.6 oz) Last BM Date: 04/14/13  Intake/Output from previous day: 06/25 0701 - 06/26 0700 In: 958.3 [I.V.:308.3; IV Piggyback:650] Out: 6050 [Urine:6050]  PHYSICAL EXAM General appearance: alert, cooperative and moderate distress Resp: rhonchi bilaterally Cardio: irregularly irregular rhythm GI: soft, non-tender; bowel sounds normal; no masses,  no organomegaly Extremities: She still has significant edema  Lab Results:    Basic Metabolic Panel:  Recent Labs  16/10/96 0447 04/18/13 0430  NA 141 147*  K 5.3* 3.1*  CL 100 101  CO2 36* 42*  GLUCOSE 122* 97  BUN 47* 44*  CREATININE 2.09* 1.76*  CALCIUM 9.6 8.8  PHOS  --  1.6*   Liver Function Tests:  Recent Labs  04/17/13 0447  AST 25  ALT 11  ALKPHOS 131*  BILITOT 0.7  PROT 7.8  ALBUMIN 3.4*   No results found for this basename: LIPASE, AMYLASE,  in the last 72 hours No results found for this basename: AMMONIA,  in the last 72 hours CBC:  Recent Labs  04/17/13 0447 04/18/13 0430  WBC 8.9 8.5  NEUTROABS 6.0  --   HGB 12.9 11.3*  HCT 46.7* 36.9  MCV 89.6 80.7  PLT 317 209   Cardiac Enzymes: No results found for this basename: CKTOTAL, CKMB, CKMBINDEX, TROPONINI,  in the last 72 hours BNP: No results found for this basename: PROBNP,  in the last 72 hours D-Dimer: No results found for this basename: DDIMER,  in the last 72 hours CBG:  Recent Labs  04/17/13 2002 04/17/13 2359 04/18/13 0029 04/18/13 0333  04/18/13 0414 04/18/13 0733  GLUCAP 91 59* 93 63* 110* 84   Hemoglobin A1C: No results found for this basename: HGBA1C,  in the last 72 hours Fasting Lipid Panel: No results found for this basename: CHOL, HDL, LDLCALC, TRIG, CHOLHDL, LDLDIRECT,  in the last 72 hours Thyroid Function Tests: No results found for this basename: TSH, T4TOTAL, FREET4, T3FREE, THYROIDAB,  in the last 72 hours Anemia Panel: No results found for this basename: VITAMINB12, FOLATE, FERRITIN, TIBC, IRON, RETICCTPCT,  in the last 72 hours Coagulation:  Recent Labs  04/17/13 1210 04/18/13 0430  LABPROT 32.7* 21.4*  INR 3.35* 1.92*   Urine Drug Screen: Drugs of Abuse  No results found for this basename: labopia, cocainscrnur, labbenz, amphetmu, thcu, labbarb    Alcohol Level: No results found for this basename: ETH,  in the last 72 hours Urinalysis:  Recent Labs  04/17/13 1127  COLORURINE YELLOW  LABSPEC 1.010  PHURINE 5.5  GLUCOSEU NEGATIVE  HGBUR LARGE*  BILIRUBINUR NEGATIVE  KETONESUR NEGATIVE  PROTEINUR NEGATIVE  UROBILINOGEN 0.2  NITRITE NEGATIVE  LEUKOCYTESUR MODERATE*   Misc. Labs:  ABGS  Recent Labs  04/17/13 0815  PHART 7.372  PO2ART 128.0*  TCO2 28.2  HCO3 31.0*   CULTURES Recent Results (from the past 240 hour(s))  MRSA PCR SCREENING     Status: None   Collection Time    04/17/13  6:18 AM      Result Value Range Status   MRSA by PCR NEGATIVE  NEGATIVE Final   Comment:            The GeneXpert MRSA Assay (FDA     approved for NASAL specimens     only), is one component of a     comprehensive MRSA colonization     surveillance program. It is not     intended to diagnose MRSA     infection nor to guide or     monitor treatment for     MRSA infections.   Studies/Results: Ct Head Wo Contrast  04/17/2013   *RADIOLOGY REPORT*  Clinical Data: Hypertension.  Diabetes.  Atrial fibrillation. Unresponsive.  CT HEAD WITHOUT CONTRAST  Technique:  Contiguous axial images  were obtained from the base of the skull through the vertex without contrast.  Comparison: None.  Findings: There is no evidence of old or acute focal infarction. No mass lesion, hemorrhage, hydrocephalus or extra-axial collection.  The calvarium is unremarkable.  There are these mucosal inflammation and within the paranasal sinuses.  IMPRESSION: No acute or focal brain pathology.  Normal appearing brain for age by CT.  Some inflammatory change of the paranasal sinuses.   Original Report Authenticated By: Paulina Fusi, M.D.   US Renal  04/17/2013   *RADIOLOGY REPORT*  Clinical Data: 62 year old female with renal failure.  The patient is unresponsive.  RENAL/URINARY TRACT ULTRASOUND COMPLETE  Comparison:  None.  Findings:  Right Kidney:  No hydronephrosis.  Renal cortical thinning.  Renal length 8.4 cm.  Left Kidney:  Difficult to visualize.  No definite hydronephrosis. Approximate renal length 9.3 cm.  Bladder:  Decompressed and Foley catheter balloon visible.  Other findings:  Evidence of ascites in the pelvis.  IMPRESSION: 1.  No definite acute renal findings.  Right renal cortical atrophy.  The left kidney is difficult to visualize. 2.  Ascites in the pelvis.   Original Report Authenticated By: Erskine Speed, M.D.   Portable Chest Xray  04/17/2013   *RADIOLOGY REPORT*  Clinical Data: ET tube placement.  PORTABLE CHEST - 1 VIEW  Comparison: 04/14/2013  Findings: Endotracheal tube has been placed and is at the level of the carina directed toward the right main stem bronchus.  Recommend retracting 3 cm.  Cardiomegaly with vascular congestion and mild perihilar and lower lobe opacities.  No visible effusions.  No acute bony abnormality.  IMPRESSION: Endotracheal tube at the level of the carina directed toward the right mainstem bronchus.  Recommend retracting 3 cm.  Cardiomegaly.  Bilateral perihilar and lower lobe airspace opacities, likely edema.   Original Report Authenticated By: Charlett Nose, M.D.   Dg Chest  Port 1v Same Day  04/17/2013   *RADIOLOGY REPORT*  Clinical Data: Nasogastric tube placement.  PORTABLE CHEST - 1 VIEW SAME DAY  Comparison: 04/17/2013.  Findings: Nasogastric tube is followed into the stomach. Endotracheal tube terminates at the orifice of the right mainstem bronchus.  Heart appears enlarged, stable.  Bibasilar dependent air space disease appears slightly progressive.  IMPRESSION:  1.  Endotracheal tube is at the wall of the right mainstem bronchus.  Retracting approximately 3 cm would better position the tip above the carina. These results will be called to the ordering clinician or representative by the Radiologist Assistant, and communication documented in the PACS Dashboard. 2.  Nasogastric tube is followed into the stomach. 3.  Bibasilar dependent air space disease, worsening.   Original Report Authenticated  By: Leanna Battles, M.D.    Medications:  Scheduled: . antiseptic oral rinse  15 mL Mouth Rinse QID  . bimatoprost  1 drop Both Eyes QHS  . chlorhexidine  15 mL Mouth Rinse BID  . enoxaparin (LOVENOX) injection  100 mg Subcutaneous Q12H  . furosemide  40 mg Intravenous BID  . insulin aspart  2-6 Units Subcutaneous Q4H  . levofloxacin (LEVAQUIN) IV  750 mg Intravenous Q48H  . Levothyroxine Sodium  100 mcg Intravenous Q24H  . metoprolol  5 mg Intravenous Q6H  . nystatin   Topical BID  . pantoprazole (PROTONIX) IV  40 mg Intravenous Daily  . potassium phosphate IVPB (mmol)  30 mmol Intravenous Once  . vancomycin  1,500 mg Intravenous Q24H  . Warfarin - Pharmacist Dosing Inpatient   Does not apply Q24H   Continuous: . dextrose 50 mL/hr at 04/18/13 0407  . DOPamine     ZOX:WRUEAVWU, dextrose, levalbuterol, midazolam, morphine injection, ondansetron (ZOFRAN) IV  Assesment: She has acute on chronic respiratory failure. She has cor pulmonale and what seems to be acute on chronic right heart failure. She is still on 60% oxygen but I anticipate reducing oxygen through the  day today and perhaps being able to extubate Active Problems:   DIABETES MELLITUS, TYPE II   HYPERLIPIDEMIA   Morbid obesity with BMI of 40.0-44.9, adult   HYPERTENSION   FIBRILLATION, ATRIAL   Long term current use of anticoagulant   CHF exacerbation   Acute respiratory failure   Acute renal failure   Acute diastolic heart failure    Plan: As above    LOS: 4 days   Amber Armstrong 04/18/2013, 7:58 AM

## 2013-04-19 ENCOUNTER — Inpatient Hospital Stay (HOSPITAL_COMMUNITY): Payer: BC Managed Care – PPO

## 2013-04-19 ENCOUNTER — Encounter (HOSPITAL_COMMUNITY): Payer: BC Managed Care – PPO

## 2013-04-19 LAB — BASIC METABOLIC PANEL
BUN: 39 mg/dL — ABNORMAL HIGH (ref 6–23)
Chloride: 94 mEq/L — ABNORMAL LOW (ref 96–112)
GFR calc Af Amer: 34 mL/min — ABNORMAL LOW (ref >90)
GFR calc non Af Amer: 29 mL/min — ABNORMAL LOW (ref >90)
Glucose, Bld: 124 mg/dL — ABNORMAL HIGH (ref 70–99)
Potassium: 3.4 mEq/L — ABNORMAL LOW (ref 3.5–5.1)
Sodium: 143 mEq/L (ref 135–145)

## 2013-04-19 LAB — BLOOD GAS, ARTERIAL
Acid-Base Excess: 14.9 mmol/L — ABNORMAL HIGH (ref 0.0–2.0)
Bicarbonate: 38.4 mEq/L — ABNORMAL HIGH (ref 20.0–24.0)
FIO2: 100 %
MECHVT: 370 mL
O2 Saturation: 99.9 %
Patient temperature: 37
TCO2: 34 mmol/L (ref 0–100)

## 2013-04-19 LAB — GLUCOSE, CAPILLARY
Glucose-Capillary: 104 mg/dL — ABNORMAL HIGH (ref 70–99)
Glucose-Capillary: 96 mg/dL (ref 70–99)
Glucose-Capillary: 170 mg/dL — ABNORMAL HIGH (ref 70–99)

## 2013-04-19 LAB — CBC
HCT: 36 % (ref 36.0–46.0)
HCT: 21.5 % — ABNORMAL LOW (ref 36.0–46.0)
Hemoglobin: 11.1 g/dL — ABNORMAL LOW (ref 12.0–15.0)
Hemoglobin: 6.7 g/dL — CL (ref 12.0–15.0)
MCHC: 30.8 g/dL (ref 30.0–36.0)
RBC: 4.55 MIL/uL (ref 3.87–5.11)
RBC: 2.68 MIL/uL — ABNORMAL LOW (ref 3.87–5.11)
WBC: 9.3 10*3/uL (ref 4.0–10.5)

## 2013-04-19 MED ORDER — SODIUM CHLORIDE 0.9 % IJ SOLN
10.0000 mL | INTRAMUSCULAR | Status: DC | PRN
  Administered 2013-04-25: 10 mL

## 2013-04-19 MED ORDER — SODIUM CHLORIDE 0.9 % IV BOLUS (SEPSIS)
1000.0000 mL | Freq: Once | INTRAVENOUS | Status: AC
  Administered 2013-04-19: 1000 mL via INTRAVENOUS

## 2013-04-19 MED ORDER — PROTAMINE SULFATE 10 MG/ML IV SOLN
INTRAVENOUS | Status: AC
  Filled 2013-04-19: qty 5

## 2013-04-19 MED ORDER — PROTAMINE SULFATE 10 MG/ML IV SOLN
50.0000 mg | Freq: Once | INTRAVENOUS | Status: AC
  Administered 2013-04-19: 50 mg via INTRAVENOUS
  Filled 2013-04-19: qty 5

## 2013-04-19 MED ORDER — SODIUM CHLORIDE 0.9 % IJ SOLN
10.0000 mL | Freq: Two times a day (BID) | INTRAMUSCULAR | Status: DC
  Administered 2013-04-20 – 2013-04-26 (×12): 10 mL
  Administered 2013-04-27: 20 mL
  Administered 2013-04-27 – 2013-04-28 (×2): 40 mL
  Administered 2013-04-28 – 2013-04-29 (×2): 20 mL

## 2013-04-19 NOTE — Progress Notes (Signed)
eLink Physician-Brief Progress Note Patient Name: Amber Armstrong DOB: November 30, 1950 MRN: [id number]  Date of Service  04/19/2013   HPI/Events of Note   Called to assess the pts R leg after femoral line removal ? If line was arterial  eICU Interventions  Pt has pulses in foot. Foot warm.  No active bleeding seen on exam   Intervention Category Major Interventions: Other:assess R groin for pain s/p fem line pull  Shan Levans 04/19/2013, 8:52 PM

## 2013-04-19 NOTE — Progress Notes (Signed)
Subjective: She did very well yesterday and yesterday afternoon was on 40% oxygen and was awake and alert. However during the night last night she had at least 2 episodes of hypoxia requiring lavage and bagging by respiratory and returned to 100% oxygen. She is attempting weaning this morning  Objective: Vital signs in last 24 hours: Temp:  [97.6 F (36.4 C)-99.4 F (37.4 C)] 99 F (37.2 C) (06/27 0400) Pulse Rate:  [88-110] 91 (06/27 0700) Resp:  [7-22] 20 (06/27 0700) BP: (86-125)/(50-90) 124/90 mmHg (06/27 0700) SpO2:  [82 %-100 %] 89 % (06/27 0741) FiO2 (%):  [39.8 %-100 %] 55 % (06/27 0741) Weight:  [100.4 kg (221 lb 5.5 oz)] 100.4 kg (221 lb 5.5 oz) (06/27 0500) Weight change: -0.6 kg (-1 lb 5.2 oz) Last BM Date: 04/14/13  Intake/Output from previous day: 06/26 0701 - 06/27 0700 In: 2647.1 [I.V.:1637.1; IV Piggyback:1010] Out: 4050 [Urine:4050]  PHYSICAL EXAM General appearance: alert, cooperative and moderate distress Resp: clear to auscultation bilaterally Cardio: irregularly irregular rhythm GI: soft, non-tender; bowel sounds normal; no masses,  no organomegaly Extremities: extremities normal, atraumatic, no cyanosis or edema  Lab Results:    Basic Metabolic Panel:  Recent Labs  44/01/02 0430 04/19/13 0416  NA 147* 143  K 3.1* 3.4*  CL 101 94*  CO2 42* 40*  GLUCOSE 97 124*  BUN 44* 39*  CREATININE 1.76* 1.77*  CALCIUM 8.8 8.4  PHOS 1.6* 2.8   Liver Function Tests:  Recent Labs  04/17/13 0447  AST 25  ALT 11  ALKPHOS 131*  BILITOT 0.7  PROT 7.8  ALBUMIN 3.4*   No results found for this basename: LIPASE, AMYLASE,  in the last 72 hours No results found for this basename: AMMONIA,  in the last 72 hours CBC:  Recent Labs  04/17/13 0447 04/18/13 0430 04/19/13 0416  WBC 8.9 8.5 8.3  NEUTROABS 6.0  --   --   HGB 12.9 11.3* 11.1*  HCT 46.7* 36.9 36.0  MCV 89.6 80.7 79.1  PLT 317 209 200   Cardiac Enzymes: No results found for this  basename: CKTOTAL, CKMB, CKMBINDEX, TROPONINI,  in the last 72 hours BNP: No results found for this basename: PROBNP,  in the last 72 hours D-Dimer: No results found for this basename: DDIMER,  in the last 72 hours CBG:  Recent Labs  04/18/13 1112 04/18/13 1619 04/18/13 2002 04/18/13 2312 04/19/13 0355 04/19/13 0718  GLUCAP 108* 129* 120* 136* 132* 115*   Hemoglobin A1C: No results found for this basename: HGBA1C,  in the last 72 hours Fasting Lipid Panel: No results found for this basename: CHOL, HDL, LDLCALC, TRIG, CHOLHDL, LDLDIRECT,  in the last 72 hours Thyroid Function Tests: No results found for this basename: TSH, T4TOTAL, FREET4, T3FREE, THYROIDAB,  in the last 72 hours Anemia Panel: No results found for this basename: VITAMINB12, FOLATE, FERRITIN, TIBC, IRON, RETICCTPCT,  in the last 72 hours Coagulation:  Recent Labs  04/18/13 0430 04/19/13 0416  LABPROT 21.4* 17.0*  INR 1.92* 1.42   Urine Drug Screen: Drugs of Abuse  No results found for this basename: labopia, cocainscrnur, labbenz, amphetmu, thcu, labbarb    Alcohol Level: No results found for this basename: ETH,  in the last 72 hours Urinalysis:  Recent Labs  04/17/13 1127  COLORURINE YELLOW  LABSPEC 1.010  PHURINE 5.5  GLUCOSEU NEGATIVE  HGBUR LARGE*  BILIRUBINUR NEGATIVE  KETONESUR NEGATIVE  PROTEINUR NEGATIVE  UROBILINOGEN 0.2  NITRITE NEGATIVE  LEUKOCYTESUR MODERATE*  Misc. Labs:  ABGS  Recent Labs  04/19/13 0635  PHART 7.595*  PO2ART 196.0*  TCO2 34.0  HCO3 38.4*   CULTURES Recent Results (from the past 240 hour(s))  MRSA PCR SCREENING     Status: None   Collection Time    04/17/13  6:18 AM      Result Value Range Status   MRSA by PCR NEGATIVE  NEGATIVE Final   Comment:            The GeneXpert MRSA Assay (FDA     approved for NASAL specimens     only), is one component of a     comprehensive MRSA colonization     surveillance program. It is not     intended to  diagnose MRSA     infection nor to guide or     monitor treatment for     MRSA infections.  URINE CULTURE     Status: None   Collection Time    04/17/13 11:40 AM      Result Value Range Status   Specimen Description URINE, CLEAN CATCH   Final   Special Requests NONE   Final   Culture  Setup Time 04/17/2013 12:20   Final   Colony Count NO GROWTH   Final   Culture NO GROWTH   Final   Report Status 04/18/2013 FINAL   Final   Studies/Results: Ct Head Wo Contrast  04/17/2013   *RADIOLOGY REPORT*  Clinical Data: Hypertension.  Diabetes.  Atrial fibrillation. Unresponsive.  CT HEAD WITHOUT CONTRAST  Technique:  Contiguous axial images were obtained from the base of the skull through the vertex without contrast.  Comparison: None.  Findings: There is no evidence of old or acute focal infarction. No mass lesion, hemorrhage, hydrocephalus or extra-axial collection.  The calvarium is unremarkable.  There are these mucosal inflammation and within the paranasal sinuses.  IMPRESSION: No acute or focal brain pathology.  Normal appearing brain for age by CT.  Some inflammatory change of the paranasal sinuses.   Original Report Authenticated By: Paulina Fusi, M.D.   US Renal  04/17/2013   *RADIOLOGY REPORT*  Clinical Data: 62 year old female with renal failure.  The patient is unresponsive.  RENAL/URINARY TRACT ULTRASOUND COMPLETE  Comparison:  None.  Findings:  Right Kidney:  No hydronephrosis.  Renal cortical thinning.  Renal length 8.4 cm.  Left Kidney:  Difficult to visualize.  No definite hydronephrosis. Approximate renal length 9.3 cm.  Bladder:  Decompressed and Foley catheter balloon visible.  Other findings:  Evidence of ascites in the pelvis.  IMPRESSION: 1.  No definite acute renal findings.  Right renal cortical atrophy.  The left kidney is difficult to visualize. 2.  Ascites in the pelvis.   Original Report Authenticated By: Erskine Speed, M.D.   Dg Chest Port 1 View  04/18/2013   *RADIOLOGY REPORT*   Clinical Data: Follow up pneumonia and CHF  PORTABLE CHEST - 1 VIEW  Comparison: Portable exam 0917 hours compared to 04/17/2013  Findings: Tip of endotracheal tube 3.0 cm above carina. Nasogastric tube extends into abdomen. Underpenetration at lung bases. Enlargement of cardiac silhouette. Pulmonary vascular congestion. Diffuse airspace and interstitial infiltrates consistent with pulmonary edema and CHF. Question coexistent consolidation or atelectasis in the lower lobes. No pneumothorax.  IMPRESSION: Diffuse pulmonary edema/CHF. Question coexistent consolidation versus atelectasis in the lower lobes.   Original Report Authenticated By: Ulyses Southward, M.D.    Medications:  Prior to Admission:  Prescriptions prior to admission  Medication Sig Dispense Refill  . furosemide (LASIX) 20 MG tablet Take 20 mg by mouth 2 (two) times daily.      Marland Kitchen glimepiride (AMARYL) 2 MG tablet Take 2 mg by mouth daily before breakfast.      . insulin glargine (LANTUS) 100 UNIT/ML injection Inject 30 Units into the skin at bedtime.       Marland Kitchen levothyroxine (SYNTHROID, LEVOTHROID) 200 MCG tablet Take 200 mcg by mouth daily before breakfast.      . metoprolol (TOPROL-XL) 200 MG 24 hr tablet TAKE ONE TABLET BY MOUTH EVERY DAY  30 tablet  0  . TAZTIA XT 360 MG 24 hr capsule TAKE ONE CAPSULE BY MOUTH EVERY DAY  90 capsule  0  . warfarin (COUMADIN) 2.5 MG tablet Take 2.5-5 mg by mouth See admin instructions. Take 1 tablet daily except for Wednesday pt takes 2 tablets      . bimatoprost (LUMIGAN) 0.03 % ophthalmic solution Place 1 drop into both eyes at bedtime.       Scheduled: . antiseptic oral rinse  15 mL Mouth Rinse QID  . bimatoprost  1 drop Both Eyes QHS  . chlorhexidine  15 mL Mouth Rinse BID  . enoxaparin (LOVENOX) injection  100 mg Subcutaneous Q12H  . furosemide  40 mg Intravenous BID  . insulin aspart  2-6 Units Subcutaneous Q4H  . levofloxacin (LEVAQUIN) IV  750 mg Intravenous Q48H  . Levothyroxine Sodium  100 mcg  Intravenous Q24H  . metoprolol  5 mg Intravenous Q6H  . nystatin   Topical BID  . pantoprazole (PROTONIX) IV  40 mg Intravenous Daily  . vancomycin  1,500 mg Intravenous Q24H   Continuous: . dextrose 5 % with kcl 75 mL/hr at 04/19/13 0400  . DOPamine     AOZ:HYQMVHQIONGE, midazolam, morphine injection, ondansetron (ZOFRAN) IV  Assesment: She has acute on chronic respiratory failure. She probably has sleep apnea, and obesity hypoventilation. She seems to have acute right heart failure. She had episodes last night of hypoxia. This morning she has more pink frothy secretions. Active Problems:   DIABETES MELLITUS, TYPE II   HYPERLIPIDEMIA   Morbid obesity with BMI of 40.0-44.9, adult   HYPERTENSION   FIBRILLATION, ATRIAL   Long term current use of anticoagulant   CHF exacerbation   Acute respiratory failure   Acute renal failure   Acute diastolic heart failure    Plan: I don't think she's going to able to come off today. However I will give her an attempt at weaning.    LOS: 5 days   Szymon Foiles L 04/19/2013, 8:07 AM

## 2013-04-19 NOTE — Progress Notes (Signed)
Was called by staff referred her persistent bleeding. Patient had a femoral central line removed earlier today after PICC line had been placed. Since removal of femoral line, staff reports the patient has been consistently bleeding. Bleeding appears to be a pulsatile nature. They have been putting pressure over the bleeding site. The patient is also receiving therapeutic dose Lovenox for atrial fibrillation. I discussed the case with pharmacy and recommendations are for protamine to reverse Lovenox. Will also give 2 units of FFP. Protamine may be repeated x1 dose in 2 hours if patient is still bleeding and PTT is elevated. I have also discussed with Dr. Leticia Penna who will continue to follow if the bleeding does not cease. Have discontinued Lovenox.  Kele Withem

## 2013-04-19 NOTE — Progress Notes (Signed)
RT FEMEROL LINE D/C'D AT 1815 AND SITE IS PULSATING BRIGHT RED BLOOD PRESSURE APPLIED FOR 35 MIN. THEN DR MEMON CALLED AND INFORMED. ORDERS RECEIVED. PRESSURE TO SITE CONTINUED. CONSENT FOR TRANSFUSION SIGNED BY SISTER BRENDA HYLER D/T PT BEING ON VENT.

## 2013-04-19 NOTE — Progress Notes (Signed)
eLink Physician-Brief Progress Note Patient Name: Amber Armstrong DOB: 03-20-1951 MRN: [id number]  Date of Service  04/19/2013   HPI/Events of Note   SBP 72 and RN called saying hematoma growing. Says Surgeon has asked for pressure application.   eICU Interventions  D/w Primary bedside team hospitalist - Dr Orvan Falconer: explained RNs neeed help at bedside directing resuscitation. He will go there to direct  Instructed RN to apply manual pressure  Instructed RN and Dr Orvan Falconer to engage surgeon Dr Girtha Rm to bedside, ? Needs to go to OR  Patient bleeding and likely unsafe transfer to [facility]   Intervention Category Major Interventions: Other: Intermediate Interventions: Electrolyte abnormality - evaluation and management  Lyon Dumont 04/19/2013, 10:58 PM

## 2013-04-19 NOTE — Progress Notes (Signed)
9pm no right groin swelling 10pm noticed swelling and bruising of right groin. MDs notified; orders obtained.

## 2013-04-19 NOTE — Progress Notes (Signed)
Subjective: Interval History: No chest pain.  Objective: Vital signs in last 24 hours: Temp:  [97.6 F (36.4 C)-99.4 F (37.4 C)] 99 F (37.2 C) (06/27 0400) Pulse Rate:  [91-110] 91 (06/27 0700) Resp:  [7-22] 20 (06/27 0700) BP: (86-148)/(50-94) 148/94 mmHg (06/27 0804) SpO2:  [82 %-100 %] 92 % (06/27 0804) FiO2 (%):  [39.8 %-100 %] 60 % (06/27 0804) Weight:  [100.4 kg (221 lb 5.5 oz)] 100.4 kg (221 lb 5.5 oz) (06/27 0500) Weight change: -0.6 kg (-1 lb 5.2 oz)  Intake/Output from previous day: 06/26 0701 - 06/27 0700 In: 2797.1 [I.V.:1787.1; IV Piggyback:1010] Out: 4050 [Urine:4050] Intake/Output this shift: Total I/O In: 225 [I.V.:75; IV Piggyback:150] Out: -   Generally patient is alert today and she doesn't seem to be in any apparent distress. Chest decreased breath sound bilaterally and anteriorly. She has also some inspiratory crackles but Heart irregular rate and rhythm no murmur Abdomen soft positive bowel sound Extremities she has trace lower extremity edema however she has significant upper extremity edema.   Lab Results:  Recent Labs  04/18/13 0430 04/19/13 0416  WBC 8.5 8.3  HGB 11.3* 11.1*  HCT 36.9 36.0  PLT 209 200   BMET:   Recent Labs  04/18/13 0430 04/19/13 0416  NA 147* 143  K 3.1* 3.4*  CL 101 94*  CO2 42* 40*  GLUCOSE 97 124*  BUN 44* 39*  CREATININE 1.76* 1.77*  CALCIUM 8.8 8.4   No results found for this basename: PTH,  in the last 72 hours Iron Studies: No results found for this basename: IRON, TIBC, TRANSFERRIN, FERRITIN,  in the last 72 hours  Studies/Results: Ct Head Wo Contrast  04/17/2013   *RADIOLOGY REPORT*  Clinical Data: Hypertension.  Diabetes.  Atrial fibrillation. Unresponsive.  CT HEAD WITHOUT CONTRAST  Technique:  Contiguous axial images were obtained from the base of the skull through the vertex without contrast.  Comparison: None.  Findings: There is no evidence of old or acute focal infarction. No mass lesion,  hemorrhage, hydrocephalus or extra-axial collection.  The calvarium is unremarkable.  There are these mucosal inflammation and within the paranasal sinuses.  IMPRESSION: No acute or focal brain pathology.  Normal appearing brain for age by CT.  Some inflammatory change of the paranasal sinuses.   Original Report Authenticated By: Paulina Fusi, M.D.   US Renal  04/17/2013   *RADIOLOGY REPORT*  Clinical Data: 62 year old female with renal failure.  The patient is unresponsive.  RENAL/URINARY TRACT ULTRASOUND COMPLETE  Comparison:  None.  Findings:  Right Kidney:  No hydronephrosis.  Renal cortical thinning.  Renal length 8.4 cm.  Left Kidney:  Difficult to visualize.  No definite hydronephrosis. Approximate renal length 9.3 cm.  Bladder:  Decompressed and Foley catheter balloon visible.  Other findings:  Evidence of ascites in the pelvis.  IMPRESSION: 1.  No definite acute renal findings.  Right renal cortical atrophy.  The left kidney is difficult to visualize. 2.  Ascites in the pelvis.   Original Report Authenticated By: Erskine Speed, M.D.   Dg Chest Port 1 View  04/18/2013   *RADIOLOGY REPORT*  Clinical Data: Follow up pneumonia and CHF  PORTABLE CHEST - 1 VIEW  Comparison: Portable exam 0917 hours compared to 04/17/2013  Findings: Tip of endotracheal tube 3.0 cm above carina. Nasogastric tube extends into abdomen. Underpenetration at lung bases. Enlargement of cardiac silhouette. Pulmonary vascular congestion. Diffuse airspace and interstitial infiltrates consistent with pulmonary edema and CHF. Question coexistent consolidation  or atelectasis in the lower lobes. No pneumothorax.  IMPRESSION: Diffuse pulmonary edema/CHF. Question coexistent consolidation versus atelectasis in the lower lobes.   Original Report Authenticated By: Ulyses Southward, M.D.    I have reviewed the patient's current medications.  Assessment/Plan: Problem #1 acute kidney injury superimposed on chronic her BUN and creatinine presently  is stable.. Patient ultrasound showed bilaterally small and echogenic kidneys. 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  Problem #2 CHF patient on Lasix was good urine output. Problem #3 hypokalemia related most likely to her diuretics. Her potassium is improving. Problem #4 diabetes Problem #5 a trial fibrillation her heart rate is controlled Problem #6 respiratory failure possibly combination of CHF and also pneumonia. Patient is presently remains intubated. Problem #7 hyperlipidemia Problem #8 obesity Problem #9 hypertension presently patient blood pressure seems to be low normal. Problem #10 hypernatremia most likely secondary to lack of free water and have normal saline. Presently her potassium has corrected. Plan: We'll continue his present management We'll check her basic metabolic panel in the morning.    LOS: 5 days   Taurean Ju S 04/19/2013,9:05 AM

## 2013-04-19 NOTE — Progress Notes (Signed)
Verbal order to have blood wide open per dr ziegler

## 2013-04-19 NOTE — Progress Notes (Signed)
[facility] PROGRESS NOTE  Amber Armstrong WJX:[id number] DOB: [birth year] DOA: 04/14/2013 PCP: Pershing Proud  Assessment/Plan: 1. Acute on chronic respiratory failure, multifactorial. Patient is currently on ventilator. She is undergoing weaning trial. We'll defer extubation to pulmonology. She appears to be improving. Congestive heart failure appears to be improving. She is started on antibiotics for a component of pneumonia. Her WBC count is normal she does not have any fevers. We'll continue current treatments for now. 2. Acute right-sided congestive heart failure. Diuresis appears improved over the last 24-48 hours. We will continue with current treatments. She has been weaned off of vasopressors. 3. Atrial fibrillation with slow ventricular response. She is anticoagulated. Her rate appears to be controlled on Lopressor. 4. Acute renal failure. It appears to be improving with diuresis. 5. Type 2 diabetes. Patient has been running hypoglycemic and was started on dextrose infusion. Her Lantus was subsequently held on 6/25. We will discontinue dextrose infusion today and monitor CBGs closely. 6. Hypertension. Stable  Code Status: Patient was made DO NOT RESUSCITATE by Dr. Karilyn Cota.; Family Communication: discussed with family at the bedside Disposition Plan: to be determined   Consultants:  [facility] cardiology  Nephrology, Dr. Kristian Covey  General surgery, Dr. Leticia Penna for central line placement  Procedures:  Intubation on 6/25  Central line placement on 6/25  Antibiotics: Vancomycin 6/25 Levofloxacin 6/25  HPI/Subjective: Patient is on ventilator, she is awake, calm, able to respond to questions.  Objective: Filed Vitals:   04/19/13 0915 04/19/13 0930 04/19/13 0945 04/19/13 1000  BP:    131/77  Pulse: 106 96 107 107  Temp:      TempSrc:      Resp: 18 14 21 18   Height:      Weight:      SpO2: 99% 81% 75% 96%    Intake/Output Summary (Last 24 hours) at  04/19/13 1040 Last data filed at 04/19/13 2130  Gross per 24 hour  Intake 1703.75 ml  Output   4050 ml  Net -2346.25 ml   Filed Weights   04/17/13 0525 04/18/13 0500 04/19/13 0500  Weight: 108.5 kg (239 lb 3.2 oz) 101 kg (222 lb 10.6 oz) 100.4 kg (221 lb 5.5 oz)    Exam:   General:  NAD  Cardiovascular: s1, s2, tachycardic, irregular  Respiratory: rhonchi bilaterally  Abdomen: soft, nt, nd, bs+  Musculoskeletal: 1+ edema b/l   Data Reviewed: Basic Metabolic Panel:  Recent Labs Lab 04/15/13 0458 04/16/13 0526 04/17/13 0447 04/18/13 0430 04/19/13 0416  NA 142 142 141 147* 143  K 5.0 5.1 5.3* 3.1* 3.4*  CL 101 100 100 101 94*  CO2 35* 34* 36* 42* 40*  GLUCOSE 114* 149* 122* 97 124*  BUN 41* 44* 47* 44* 39*  CREATININE 1.91* 1.97* 2.09* 1.76* 1.77*  CALCIUM 9.0 9.6 9.6 8.8 8.4  PHOS  --   --   --  1.6* 2.8   Liver Function Tests:  Recent Labs Lab 04/17/13 0447  AST 25  ALT 11  ALKPHOS 131*  BILITOT 0.7  PROT 7.8  ALBUMIN 3.4*   No results found for this basename: LIPASE, AMYLASE,  in the last 168 hours No results found for this basename: AMMONIA,  in the last 168 hours CBC:  Recent Labs Lab 04/14/13 1030 04/17/13 0447 04/18/13 0430 04/19/13 0416  WBC 7.9 8.9 8.5 8.3  NEUTROABS 5.1 6.0  --   --   HGB 13.1 12.9 11.3* 11.1*  HCT 44.1 46.7* 36.9 36.0  MCV 84.3  89.6 80.7 79.1  PLT 293 317 209 200   Cardiac Enzymes:  Recent Labs Lab 04/14/13 1030 04/14/13 1847 04/14/13 2353 04/15/13 0458  TROPONINI <0.30 <0.30 <0.30 <0.30   BNP (last 3 results)  Recent Labs  04/14/13 1030  PROBNP 12159.0*   CBG:  Recent Labs Lab 04/18/13 1619 04/18/13 2002 04/18/13 2312 04/19/13 0355 04/19/13 0718  GLUCAP 129* 120* 136* 132* 115*    Recent Results (from the past 240 hour(s))  MRSA PCR SCREENING     Status: None   Collection Time    04/17/13  6:18 AM      Result Value Range Status   MRSA by PCR NEGATIVE  NEGATIVE Final   Comment:             The GeneXpert MRSA Assay (FDA     approved for NASAL specimens     only), is one component of a     comprehensive MRSA colonization     surveillance program. It is not     intended to diagnose MRSA     infection nor to guide or     monitor treatment for     MRSA infections.  URINE CULTURE     Status: None   Collection Time    04/17/13 11:40 AM      Result Value Range Status   Specimen Description URINE, CLEAN CATCH   Final   Special Requests NONE   Final   Culture  Setup Time 04/17/2013 12:20   Final   Colony Count NO GROWTH   Final   Culture NO GROWTH   Final   Report Status 04/18/2013 FINAL   Final     Studies: Ct Head Wo Contrast  04/17/2013   *RADIOLOGY REPORT*  Clinical Data: Hypertension.  Diabetes.  Atrial fibrillation. Unresponsive.  CT HEAD WITHOUT CONTRAST  Technique:  Contiguous axial images were obtained from the base of the skull through the vertex without contrast.  Comparison: None.  Findings: There is no evidence of old or acute focal infarction. No mass lesion, hemorrhage, hydrocephalus or extra-axial collection.  The calvarium is unremarkable.  There are these mucosal inflammation and within the paranasal sinuses.  IMPRESSION: No acute or focal brain pathology.  Normal appearing brain for age by CT.  Some inflammatory change of the paranasal sinuses.   Original Report Authenticated By: Paulina Fusi, M.D.   US Renal  04/17/2013   *RADIOLOGY REPORT*  Clinical Data: 62 year old female with renal failure.  The patient is unresponsive.  RENAL/URINARY TRACT ULTRASOUND COMPLETE  Comparison:  None.  Findings:  Right Kidney:  No hydronephrosis.  Renal cortical thinning.  Renal length 8.4 cm.  Left Kidney:  Difficult to visualize.  No definite hydronephrosis. Approximate renal length 9.3 cm.  Bladder:  Decompressed and Foley catheter balloon visible.  Other findings:  Evidence of ascites in the pelvis.  IMPRESSION: 1.  No definite acute renal findings.  Right renal cortical  atrophy.  The left kidney is difficult to visualize. 2.  Ascites in the pelvis.   Original Report Authenticated By: Erskine Speed, M.D.   Dg Chest Port 1 View  04/18/2013   *RADIOLOGY REPORT*  Clinical Data: Follow up pneumonia and CHF  PORTABLE CHEST - 1 VIEW  Comparison: Portable exam 0917 hours compared to 04/17/2013  Findings: Tip of endotracheal tube 3.0 cm above carina. Nasogastric tube extends into abdomen. Underpenetration at lung bases. Enlargement of cardiac silhouette. Pulmonary vascular congestion. Diffuse airspace and interstitial infiltrates consistent with pulmonary  edema and CHF. Question coexistent consolidation or atelectasis in the lower lobes. No pneumothorax.  IMPRESSION: Diffuse pulmonary edema/CHF. Question coexistent consolidation versus atelectasis in the lower lobes.   Original Report Authenticated By: Ulyses Southward, M.D.    Scheduled Meds: . antiseptic oral rinse  15 mL Mouth Rinse QID  . bimatoprost  1 drop Both Eyes QHS  . chlorhexidine  15 mL Mouth Rinse BID  . enoxaparin (LOVENOX) injection  100 mg Subcutaneous Q12H  . furosemide  40 mg Intravenous BID  . insulin aspart  2-6 Units Subcutaneous Q4H  . levofloxacin (LEVAQUIN) IV  750 mg Intravenous Q48H  . Levothyroxine Sodium  100 mcg Intravenous Q24H  . metoprolol  5 mg Intravenous Q6H  . nystatin   Topical BID  . pantoprazole (PROTONIX) IV  40 mg Intravenous Daily  . vancomycin  1,500 mg Intravenous Q24H   Continuous Infusions: . DOPamine      Active Problems:   DIABETES MELLITUS, TYPE II   HYPERLIPIDEMIA   Morbid obesity with BMI of 40.0-44.9, adult   HYPERTENSION   FIBRILLATION, ATRIAL   Long term current use of anticoagulant   CHF exacerbation   Acute respiratory failure   Acute renal failure   Acute diastolic heart failure    Time spent:    Kailo Kosik  [facility] Pager [phone]. If 7PM-7AM, please contact night-coverage at [email or web address], password [email or web address] 04/19/2013, 10:40 AM   LOS: 5 days

## 2013-04-19 NOTE — Progress Notes (Signed)
Started weaning Pt at 0730,5/5 and 50%. The Pt had oxygenation issues and VT issues. Increased PS to 10 and her VT amd SATS were still low so RT increased to 12 then to 20 PS.The Pt VT are in the 300's now. The Pt's O2 SATs are fluctuating a lot but the ABG shows that her PO2 is in normal range. RT willl try to decrease PS as the day goes on. Its 1045 and I will be putting her on18 of PS and lowering her O2 to 55%

## 2013-04-19 NOTE — Progress Notes (Signed)
PINPOINT DRSG APPLIED FOR 15. SITE CHECKED. OOZING NOW  INSTEAD OF PULSATING. PRESSURE HELD AGAIN FOR FIVE MINUTES  THEN PRESSURE DRSG APPLIED. THE 10 POUND PRESSURE BAG APPLIED.  BLOOD DRAWN FROM PICCLINE FOR TYPE AND CROSS MATCH.

## 2013-04-19 NOTE — Progress Notes (Signed)
eLink Physician-Brief Progress Note Patient Name: RAYETTE MOGG DOB: 1951-07-15 MRN: [id number]  Date of Service  04/19/2013   HPI/Events of Note   Rt femoral art line pulled out approx 6pm and then pressure held for long time per rn. Most recent   Recent Labs Lab 04/14/13 1030 04/17/13 0447 04/18/13 0430 04/19/13 0416 04/19/13 1934  HGB 13.1 12.9 11.3* 11.1* 10.0*    was okay and protaimnie and ffp given but now suddenly patient hypotneisve and has hematoma rt groin that I can see on camera exam  eICU Interventions  Fluid bolus Stat labs Asked RN to call hospitalist primary team to bedsid; might need surgical eval?   Intervention Category Major Interventions: Other: Intermediate Interventions: Electrolyte abnormality - evaluation and management  Anaira Seay 04/19/2013, 9:54 PM

## 2013-04-19 NOTE — Progress Notes (Signed)
Called to patients room after a period of desaturation into the 70s after and bath and turning;increased O2 to 100% but saturations dropped shortly after;repostioned pulse ox probe to make sure of a good wave form;still remained in the 70s. I took patient off ventilator and manually bag patient for a few minutes to bring saturations up to 100%;modrate amount of thick reddish,tan ,yellow secretion came up and out of tube. Patient remained in lower 90s and then dropped back down into the 70s. Patient was placed on 100%.

## 2013-04-19 NOTE — Progress Notes (Signed)
Patient ID: Amber Armstrong, female   DOB: 1951/01/11, 62 y.o.   MRN: [id number]   Patient Name: Amber Armstrong Date of Encounter: 04/19/2013    SUBJECTIVE  Mrs. Shropshire is clearly better today. She is alert on the vent and recognizes me. Continues to diuresis. Renal function is improving by numbers. Her atrial fib rate is fairly well controlled in the 90s.  CURRENT MEDS . antiseptic oral rinse  15 mL Mouth Rinse QID  . bimatoprost  1 drop Both Eyes QHS  . chlorhexidine  15 mL Mouth Rinse BID  . enoxaparin (LOVENOX) injection  100 mg Subcutaneous Q12H  . furosemide  40 mg Intravenous BID  . insulin aspart  2-6 Units Subcutaneous Q4H  . levofloxacin (LEVAQUIN) IV  750 mg Intravenous Q48H  . Levothyroxine Sodium  100 mcg Intravenous Q24H  . metoprolol  5 mg Intravenous Q6H  . nystatin   Topical BID  . pantoprazole (PROTONIX) IV  40 mg Intravenous Daily  . vancomycin  1,500 mg Intravenous Q24H    OBJECTIVE  Filed Vitals:   04/19/13 0700 04/19/13 0727 04/19/13 0741 04/19/13 0804  BP: 124/90   148/94  Pulse: 91     Temp:      TempSrc:      Resp: 20     Height:      Weight:      SpO2: 100% 90% 89% 92%    Intake/Output Summary (Last 24 hours) at 04/19/13 0920 Last data filed at 04/19/13 7829  Gross per 24 hour  Intake 3022.08 ml  Output   4050 ml  Net -1027.92 ml   Filed Weights   04/17/13 0525 04/18/13 0500 04/19/13 0500  Weight: 239 lb 3.2 oz (108.5 kg) 222 lb 10.6 oz (101 kg) 221 lb 5.5 oz (100.4 kg)    PHYSICAL EXAM  General: Pleasant, NAD. Morbidly obese Neuro: Alert and oriented X 3. Moves all extremities spontaneously. Psych: Normal affect. HEENT:  Normal  Neck: Supple without bruits or JVD. Lungs:  Resp regular and unlabored, CTA. Heart: Irregular rate and rhythm, no s3, s4, or murmurs. Abdomen: Soft, non-tender, non-distended, BS + x 4.  Extremities: No clubbing, cyanosis , 1+ pitting edema in the legs, 3+ in the hands.Marland Kitchen DP/PT/Radials 2+ and equal  bilaterally.  Accessory Clinical Findings  CBC  Recent Labs  04/17/13 0447 04/18/13 0430 04/19/13 0416  WBC 8.9 8.5 8.3  NEUTROABS 6.0  --   --   HGB 12.9 11.3* 11.1*  HCT 46.7* 36.9 36.0  MCV 89.6 80.7 79.1  PLT 317 209 200   Basic Metabolic Panel  Recent Labs  04/18/13 0430 04/19/13 0416  NA 147* 143  K 3.1* 3.4*  CL 101 94*  CO2 42* 40*  GLUCOSE 97 124*  BUN 44* 39*  CREATININE 1.76* 1.77*  CALCIUM 8.8 8.4  PHOS 1.6* 2.8   Liver Function Tests  Recent Labs  04/17/13 0447  AST 25  ALT 11  ALKPHOS 131*  BILITOT 0.7  PROT 7.8  ALBUMIN 3.4*   No results found for this basename: LIPASE, AMYLASE,  in the last 72 hours Cardiac Enzymes No results found for this basename: CKTOTAL, CKMB, CKMBINDEX, TROPONINI,  in the last 72 hours BNP No components found with this basename: POCBNP,  D-Dimer No results found for this basename: DDIMER,  in the last 72 hours Hemoglobin A1C No results found for this basename: HGBA1C,  in the last 72 hours Fasting Lipid Panel No results found for this basename:  CHOL, HDL, LDLCALC, TRIG, CHOLHDL, LDLDIRECT,  in the last 72 hours Thyroid Function Tests No results found for this basename: TSH, T4TOTAL, FREET3, T3FREE, THYROIDAB,  in the last 72 hours  TELE Atrial fib  ECG    Radiology/Studies  Ct Head Wo Contrast  04/17/2013   *RADIOLOGY REPORT*  Clinical Data: Hypertension.  Diabetes.  Atrial fibrillation. Unresponsive.  CT HEAD WITHOUT CONTRAST  Technique:  Contiguous axial images were obtained from the base of the skull through the vertex without contrast.  Comparison: None.  Findings: There is no evidence of old or acute focal infarction. No mass lesion, hemorrhage, hydrocephalus or extra-axial collection.  The calvarium is unremarkable.  There are these mucosal inflammation and within the paranasal sinuses.  IMPRESSION: No acute or focal brain pathology.  Normal appearing brain for age by CT.  Some inflammatory change of  the paranasal sinuses.   Original Report Authenticated By: Paulina Fusi, M.D.   US Renal  04/17/2013   *RADIOLOGY REPORT*  Clinical Data: 63 year old female with renal failure.  The patient is unresponsive.  RENAL/URINARY TRACT ULTRASOUND COMPLETE  Comparison:  None.  Findings:  Right Kidney:  No hydronephrosis.  Renal cortical thinning.  Renal length 8.4 cm.  Left Kidney:  Difficult to visualize.  No definite hydronephrosis. Approximate renal length 9.3 cm.  Bladder:  Decompressed and Foley catheter balloon visible.  Other findings:  Evidence of ascites in the pelvis.  IMPRESSION: 1.  No definite acute renal findings.  Right renal cortical atrophy.  The left kidney is difficult to visualize. 2.  Ascites in the pelvis.   Original Report Authenticated By: Erskine Speed, M.D.   Dg Chest Port 1 View  04/18/2013   *RADIOLOGY REPORT*  Clinical Data: Follow up pneumonia and CHF  PORTABLE CHEST - 1 VIEW  Comparison: Portable exam 0917 hours compared to 04/17/2013  Findings: Tip of endotracheal tube 3.0 cm above carina. Nasogastric tube extends into abdomen. Underpenetration at lung bases. Enlargement of cardiac silhouette. Pulmonary vascular congestion. Diffuse airspace and interstitial infiltrates consistent with pulmonary edema and CHF. Question coexistent consolidation or atelectasis in the lower lobes. No pneumothorax.  IMPRESSION: Diffuse pulmonary edema/CHF. Question coexistent consolidation versus atelectasis in the lower lobes.   Original Report Authenticated By: Ulyses Southward, M.D.   Portable Chest Xray  04/17/2013   *RADIOLOGY REPORT*  Clinical Data: ET tube placement.  PORTABLE CHEST - 1 VIEW  Comparison: 04/14/2013  Findings: Endotracheal tube has been placed and is at the level of the carina directed toward the right main stem bronchus.  Recommend retracting 3 cm.  Cardiomegaly with vascular congestion and mild perihilar and lower lobe opacities.  No visible effusions.  No acute bony abnormality.   IMPRESSION: Endotracheal tube at the level of the carina directed toward the right mainstem bronchus.  Recommend retracting 3 cm.  Cardiomegaly.  Bilateral perihilar and lower lobe airspace opacities, likely edema.   Original Report Authenticated By: Charlett Nose, M.D.   Dg Chest Portable 1 View  04/14/2013   *RADIOLOGY REPORT*  Clinical Data: Short of breath  PORTABLE CHEST - 1 VIEW  Comparison: None.  Findings: Cardiac silhouette is enlarged.  There is central venous pulmonary congestion.  The lung bases are poorly evaluated due to technique and body habitus.  Likely bilateral effusions.  IMPRESSION: Cardiomegaly, central venous congestion. and  bilateral effusions consistent congestive heart failure.   Original Report Authenticated By: Genevive Bi, M.D.   Dg Chest Port 1v Same Day  04/17/2013   *RADIOLOGY  REPORT*  Clinical Data: Nasogastric tube placement.  PORTABLE CHEST - 1 VIEW SAME DAY  Comparison: 04/17/2013.  Findings: Nasogastric tube is followed into the stomach. Endotracheal tube terminates at the orifice of the right mainstem bronchus.  Heart appears enlarged, stable.  Bibasilar dependent air space disease appears slightly progressive.  IMPRESSION:  1.  Endotracheal tube is at the Devani Odonnel of the right mainstem bronchus.  Retracting approximately 3 cm would better position the tip above the carina. These results will be called to the ordering clinician or representative by the Radiologist Assistant, and communication documented in the PACS Dashboard. 2.  Nasogastric tube is followed into the stomach. 3.  Bibasilar dependent air space disease, worsening.   Original Report Authenticated By: Leanna Battles, M.D.    ASSESSMENT AND PLAN  Active Problems:   DIABETES MELLITUS, TYPE II   HYPERLIPIDEMIA   Morbid obesity with BMI of 40.0-44.9, adult   HYPERTENSION   FIBRILLATION, ATRIAL   Long term current use of anticoagulant   CHF exacerbation   Acute respiratory failure   Acute renal  failure   Acute diastolic heart failure      She is clearly doing better with improved diuresis. Chest x-ray still shows some pulmonary edema and congestion. Would continue with IV Lasix which are doing. Continue to watch daily renal function. Would not begin any rate slowing drugs other than metoprolol when necessary. Hopefully extubation next day or 2.  Valera Castle MD

## 2013-04-19 NOTE — Progress Notes (Signed)
Pt O2 sats dropped in the 75% to 77%. Respiratory notified & pt was manually bagged on 100% & thick tan copious secretions were suctioned. Pt placed on 100% 02 followed by resp tx. Saturations came up to 100%  Abg drawn @0630  Pt was not in distress during these events.

## 2013-04-20 ENCOUNTER — Inpatient Hospital Stay (HOSPITAL_COMMUNITY): Payer: BC Managed Care – PPO

## 2013-04-20 ENCOUNTER — Encounter (HOSPITAL_COMMUNITY): Payer: Self-pay | Admitting: Anesthesiology

## 2013-04-20 ENCOUNTER — Inpatient Hospital Stay (HOSPITAL_COMMUNITY): Payer: BC Managed Care – PPO | Admitting: Anesthesiology

## 2013-04-20 ENCOUNTER — Encounter (HOSPITAL_COMMUNITY): Payer: BC Managed Care – PPO

## 2013-04-20 ENCOUNTER — Encounter (HOSPITAL_COMMUNITY)
Admission: EM | Disposition: A | Discharge status: Skilled Nursing Facility | Payer: Self-pay | Source: Home / Self Care | Attending: Internal Medicine

## 2013-04-20 DIAGNOSIS — D62 Acute posthemorrhagic anemia: Secondary | ICD-10-CM

## 2013-04-20 DIAGNOSIS — I729 Aneurysm of unspecified site: Secondary | ICD-10-CM | POA: Diagnosis not present

## 2013-04-20 DIAGNOSIS — R578 Other shock: Secondary | ICD-10-CM

## 2013-04-20 DIAGNOSIS — S301XXA Contusion of abdominal wall, initial encounter: Secondary | ICD-10-CM

## 2013-04-20 HISTORY — PX: GROIN DISSECTION: SHX5250

## 2013-04-20 LAB — HEMOGLOBIN AND HEMATOCRIT, BLOOD
HCT: 25.8 % — ABNORMAL LOW (ref 36.0–46.0)
Hemoglobin: 8.5 g/dL — ABNORMAL LOW (ref 12.0–15.0)

## 2013-04-20 LAB — PREPARE FRESH FROZEN PLASMA: Unit division: 0

## 2013-04-20 LAB — BLOOD GAS, ARTERIAL
MECHVT: 370 mL
PEEP: 5 cmH2O
RATE: 20 resp/min
pCO2 arterial: 38 mmHg (ref 35.0–45.0)
pH, Arterial: 7.59 — ABNORMAL HIGH (ref 7.350–7.450)

## 2013-04-20 LAB — BASIC METABOLIC PANEL
BUN: 35 mg/dL — ABNORMAL HIGH (ref 6–23)
CO2: 38 mEq/L — ABNORMAL HIGH (ref 19–32)
CO2: 38 mEq/L — ABNORMAL HIGH (ref 19–32)
Calcium: 7.4 mg/dL — ABNORMAL LOW (ref 8.4–10.5)
Chloride: 91 mEq/L — ABNORMAL LOW (ref 96–112)
Chloride: 93 mEq/L — ABNORMAL LOW (ref 96–112)
Glucose, Bld: 200 mg/dL — ABNORMAL HIGH (ref 70–99)
Glucose, Bld: 203 mg/dL — ABNORMAL HIGH (ref 70–99)
Potassium: 3.5 mEq/L (ref 3.5–5.1)
Potassium: 4 mEq/L (ref 3.5–5.1)
Sodium: 139 mEq/L (ref 135–145)
Sodium: 140 mEq/L (ref 135–145)

## 2013-04-20 LAB — GLUCOSE, CAPILLARY
Glucose-Capillary: 118 mg/dL — ABNORMAL HIGH (ref 70–99)
Glucose-Capillary: 198 mg/dL — ABNORMAL HIGH (ref 70–99)

## 2013-04-20 LAB — CBC
HCT: 27.9 % — ABNORMAL LOW (ref 36.0–46.0)
HCT: 23.3 % — ABNORMAL LOW (ref 36.0–46.0)
Hemoglobin: 9.8 g/dL — ABNORMAL LOW (ref 12.0–15.0)
Hemoglobin: 9.4 g/dL — ABNORMAL LOW (ref 12.0–15.0)
MCH: 29.1 pg (ref 26.0–34.0)
MCH: 28.2 pg (ref 26.0–34.0)
MCH: 28.5 pg (ref 26.0–34.0)
MCHC: 34.4 g/dL (ref 30.0–36.0)
MCHC: 34.8 g/dL (ref 30.0–36.0)
MCV: 82.8 fL (ref 78.0–100.0)
MCV: 82 fL (ref 78.0–100.0)
MCV: 82 fL (ref 78.0–100.0)
Platelets: 85 10*3/uL — ABNORMAL LOW (ref 150–400)
RBC: 3.37 MIL/uL — ABNORMAL LOW (ref 3.87–5.11)
RBC: 3.33 MIL/uL — ABNORMAL LOW (ref 3.87–5.11)
RDW: 15.4 % (ref 11.5–15.5)

## 2013-04-20 LAB — PREPARE RBC (CROSSMATCH)

## 2013-04-20 LAB — VANCOMYCIN, TROUGH: Vancomycin Tr: 27.3 ug/mL (ref 10.0–20.0)

## 2013-04-20 SURGERY — EXPLORATION, INGUINAL REGION
Anesthesia: General | Site: Groin | Laterality: Right | Wound class: Clean

## 2013-04-20 MED ORDER — FENTANYL CITRATE 0.05 MG/ML IJ SOLN
INTRAMUSCULAR | Status: DC | PRN
  Administered 2013-04-20 (×3): 25 ug via INTRAVENOUS

## 2013-04-20 MED ORDER — MIDAZOLAM HCL 2 MG/2ML IJ SOLN
INTRAMUSCULAR | Status: AC
  Filled 2013-04-20: qty 2

## 2013-04-20 MED ORDER — PHENYLEPHRINE HCL 10 MG/ML IJ SOLN
INTRAMUSCULAR | Status: DC | PRN
  Administered 2013-04-20 (×7): 100 ug via INTRAVENOUS
  Administered 2013-04-20: 200 ug via INTRAVENOUS
  Administered 2013-04-20 (×2): 100 ug via INTRAVENOUS
  Administered 2013-04-20 (×2): 200 ug via INTRAVENOUS

## 2013-04-20 MED ORDER — FENTANYL CITRATE 0.05 MG/ML IJ SOLN
INTRAMUSCULAR | Status: AC
  Filled 2013-04-20: qty 2

## 2013-04-20 MED ORDER — 0.9 % SODIUM CHLORIDE (POUR BTL) OPTIME
TOPICAL | Status: DC | PRN
  Administered 2013-04-20: 1000 mL

## 2013-04-20 MED ORDER — VANCOMYCIN HCL IN DEXTROSE 1-5 GM/200ML-% IV SOLN
1000.0000 mg | INTRAVENOUS | Status: DC
  Administered 2013-04-21 – 2013-04-23 (×3): 1000 mg via INTRAVENOUS
  Filled 2013-04-20 (×4): qty 200

## 2013-04-20 MED ORDER — HEMOSTATIC AGENTS (NO CHARGE) OPTIME
TOPICAL | Status: DC | PRN
  Administered 2013-04-20: 1 via TOPICAL

## 2013-04-20 MED ORDER — SODIUM CHLORIDE 0.9 % IV BOLUS (SEPSIS)
500.0000 mL | Freq: Once | INTRAVENOUS | Status: AC
  Administered 2013-04-20: 500 mL via INTRAVENOUS

## 2013-04-20 MED ORDER — PHENYLEPHRINE HCL 10 MG/ML IJ SOLN
INTRAMUSCULAR | Status: AC
  Filled 2013-04-20: qty 1

## 2013-04-20 MED ORDER — ARTIFICIAL TEARS OP OINT
TOPICAL_OINTMENT | OPHTHALMIC | Status: DC | PRN
  Administered 2013-04-20: 1 via OPHTHALMIC

## 2013-04-20 MED ORDER — BACITRACIN ZINC 500 UNIT/GM EX OINT
TOPICAL_OINTMENT | CUTANEOUS | Status: AC
  Filled 2013-04-20: qty 1.8

## 2013-04-20 MED ORDER — SODIUM CHLORIDE 0.9 % IV SOLN
INTRAVENOUS | Status: DC
  Administered 2013-04-20 (×3): via INTRAVENOUS

## 2013-04-20 MED ORDER — MIDAZOLAM HCL 5 MG/5ML IJ SOLN
INTRAMUSCULAR | Status: DC | PRN
  Administered 2013-04-20: 2 mg via INTRAVENOUS

## 2013-04-20 SURGICAL SUPPLY — 50 items
APL SKNCLS STERI-STRIP NONHPOA (GAUZE/BANDAGES/DRESSINGS)
BAG HAMPER (MISCELLANEOUS) ×2 IMPLANT
BENZOIN TINCTURE PRP APPL 2/3 (GAUZE/BANDAGES/DRESSINGS) ×1 IMPLANT
CLOTH BEACON ORANGE TIMEOUT ST (SAFETY) ×2 IMPLANT
COVER LIGHT HANDLE STERIS (MISCELLANEOUS) ×4 IMPLANT
DECANTER SPIKE VIAL GLASS SM (MISCELLANEOUS) IMPLANT
DRAIN PENROSE 18X.75 LTX STRL (MISCELLANEOUS) ×1 IMPLANT
DURAPREP 26ML APPLICATOR (WOUND CARE) ×2 IMPLANT
ELECT REM PT RETURN 9FT ADLT (ELECTROSURGICAL) ×2
ELECTRODE REM PT RTRN 9FT ADLT (ELECTROSURGICAL) ×1 IMPLANT
FORMALIN 10 PREFIL 120ML (MISCELLANEOUS) ×1 IMPLANT
GLOVE BIO SURGEON STRL SZ7.5 (GLOVE) ×2 IMPLANT
GLOVE BIOGEL PI IND STRL 7.0 (GLOVE) ×2 IMPLANT
GLOVE BIOGEL PI IND STRL 7.5 (GLOVE) ×1 IMPLANT
GLOVE BIOGEL PI INDICATOR 7.0 (GLOVE) ×2
GLOVE BIOGEL PI INDICATOR 7.5 (GLOVE) ×4
GLOVE ECLIPSE 7.0 STRL STRAW (GLOVE) ×4 IMPLANT
GOWN STRL REIN XL XLG (GOWN DISPOSABLE) ×6 IMPLANT
HEMOSTAT SNOW SURGICEL 2X4 (HEMOSTASIS) ×1 IMPLANT
INST SET MINOR GENERAL (KITS) ×2 IMPLANT
KIT ROOM TURNOVER APOR (KITS) ×2 IMPLANT
LOOP VESSEL MINI RED (MISCELLANEOUS) ×1 IMPLANT
MANIFOLD NEPTUNE II (INSTRUMENTS) ×2 IMPLANT
MARKER SKIN DUAL TIP RULER LAB (MISCELLANEOUS) ×2 IMPLANT
NDL HYPO 25X1 1.5 SAFETY (NEEDLE) ×1 IMPLANT
NEEDLE HYPO 25X1 1.5 SAFETY (NEEDLE) IMPLANT
NS IRRIG 1000ML POUR BTL (IV SOLUTION) ×2 IMPLANT
PACK MINOR (CUSTOM PROCEDURE TRAY) ×2 IMPLANT
PAD ARMBOARD 7.5X6 YLW CONV (MISCELLANEOUS) ×2 IMPLANT
SET BASIN LINEN APH (SET/KITS/TRAYS/PACK) ×2 IMPLANT
SPONGE GAUZE 4X4 12PLY (GAUZE/BANDAGES/DRESSINGS) ×1 IMPLANT
STAPLER VISISTAT 35W (STAPLE) ×1 IMPLANT
STRIP CLOSURE SKIN 1/2X4 (GAUZE/BANDAGES/DRESSINGS) ×1 IMPLANT
SUT MNCRL AB 4-0 PS2 18 (SUTURE) ×1 IMPLANT
SUT NOVA NAB GS-22 2 2-0 T-19 (SUTURE) IMPLANT
SUT NOVAFIL NAB HGS22 2-0 30IN (SUTURE) IMPLANT
SUT PROLENE 4 0 PS 2 18 (SUTURE) ×6 IMPLANT
SUT SILK 2 0 (SUTURE) ×2
SUT SILK 2-0 18XBRD TIE 12 (SUTURE) ×1 IMPLANT
SUT VIC AB 2-0 CT1 27 (SUTURE)
SUT VIC AB 2-0 CT1 TAPERPNT 27 (SUTURE) ×1 IMPLANT
SUT VIC AB 3-0 SH 27 (SUTURE) ×4
SUT VIC AB 3-0 SH 27X BRD (SUTURE) ×1 IMPLANT
SUT VICRYL AB 3 0 TIES (SUTURE) IMPLANT
SYR BULB IRRIGATION 50ML (SYRINGE) ×1 IMPLANT
SYR CONTROL 10ML LL (SYRINGE) ×1 IMPLANT
TAPE CLOTH SURG 4X10 WHT LF (GAUZE/BANDAGES/DRESSINGS) ×1 IMPLANT
TOWEL OR 17X26 4PK STRL BLUE (TOWEL DISPOSABLE) ×1 IMPLANT
VESSEL LOOPS MAXI RED (MISCELLANEOUS) ×1 IMPLANT
VESSEL LOOPS MINI RED (MISCELLANEOUS) ×2

## 2013-04-20 NOTE — Anesthesia Preprocedure Evaluation (Addendum)
Anesthesia Evaluation  Patient identified by MRN, date of birth, ID band Patient awake  General Assessment Comment:Patient intubated on vent, but is awake and seems to nod appropriately to questions.  Reviewed: Allergy & Precautions, H&P , NPO status , Patient's Chart, lab work & pertinent test results, reviewed documented beta blocker date and time   Airway      Comment: Patient intubated Dental   Pulmonary  Patient intubated for respiratory failure - rhonchi  + decreased breath sounds      Cardiovascular hypertension, On Medications +CHF + dysrhythmias Atrial Fibrillation Rhythm:irregular Rate:Tachycardia     Neuro/Psych    GI/Hepatic GERD-  ,  Endo/Other  diabetesHypothyroidism   Renal/GU Renal diseaseUrosepsis     Musculoskeletal   Abdominal   Peds  Hematology   Anesthesia Other Findings   Reproductive/Obstetrics                        Anesthesia Physical Anesthesia Plan  ASA: IV and emergent  Anesthesia Plan: General ETT   Post-op Pain Management:    Induction:   Airway Management Planned:   Additional Equipment:   Intra-op Plan:   Post-operative Plan:   Informed Consent: I have reviewed the patients History and Physical, chart, labs and discussed the procedure including the risks, benefits and alternatives for the proposed anesthesia with the patient or authorized representative who has indicated his/her understanding and acceptance.   History available from chart only  Plan Discussed with: Anesthesiologist and Surgeon  Anesthesia Plan Comments:        Anesthesia Quick Evaluation

## 2013-04-20 NOTE — Progress Notes (Signed)
ANTIBIOTIC CONSULT NOTE   Pharmacy Consult for Vancomycin Indication: pneumonia  Allergies  Allergen Reactions  . Penicillins Rash   Patient Measurements: Height: 5' (152.4 cm) Weight: 217 lb 6 oz (98.6 kg) IBW/kg (Calculated) : 45.5  Vital Signs: Temp: 97.7 F (36.5 C) (06/28 0800) Temp src: Axillary (06/28 0800) BP: 71/36 mmHg (06/28 0556) Pulse Rate: 67 (06/28 1000) Intake/Output from previous day: 06/27 0701 - 06/28 0700 In: 7549.8 [I.V.:1108.8; Blood:3191.1; IV Piggyback:1650] Out: 2650 [Urine:1650; Blood:1000] Intake/Output from this shift: Total I/O In: 225 [I.V.:225] Out: -   Labs:  Recent Labs  04/19/13 0416  04/19/13 2325 04/20/13 0157 04/20/13 0531 04/20/13 0945  WBC 8.3  --  9.3  --  9.9 9.3  HGB 11.1*  < > 6.7* 8.5* 9.8* 9.4*  PLT 200  --  146*  --  101* PENDING  CREATININE 1.77*  --  1.64*  --  1.82*  --   < > = values in this interval not displayed. Estimated Creatinine Clearance: 33.3 ml/min (by C-G formula based on Cr of 1.82).  Recent Labs  04/20/13 0840  VANCOTROUGH 27.3*    Microbiology: Recent Results (from the past 720 hour(s))  MRSA PCR SCREENING     Status: None   Collection Time    04/17/13  6:18 AM      Result Value Range Status   MRSA by PCR NEGATIVE  NEGATIVE Final   Comment:            The GeneXpert MRSA Assay (FDA     approved for NASAL specimens     only), is one component of a     comprehensive MRSA colonization     surveillance program. It is not     intended to diagnose MRSA     infection nor to guide or     monitor treatment for     MRSA infections.  URINE CULTURE     Status: None   Collection Time    04/17/13 11:40 AM      Result Value Range Status   Specimen Description URINE, CLEAN CATCH   Final   Special Requests NONE   Final   Culture  Setup Time 04/17/2013 12:20   Final   Colony Count NO GROWTH   Final   Culture NO GROWTH   Final   Report Status 04/18/2013 FINAL   Final   Medical History: Past  Medical History  Diagnosis Date  . Hypertension   . Type 2 diabetes mellitus     Type II  . Atrial fibrillation   . Nephrolithiasis   . Overweight(278.02)   . UTI (urinary tract infection)   . Hypothyroidism (acquired)    Medications:  Scheduled:  . antiseptic oral rinse  15 mL Mouth Rinse QID  . bimatoprost  1 drop Both Eyes QHS  . chlorhexidine  15 mL Mouth Rinse BID  . furosemide  40 mg Intravenous BID  . insulin aspart  2-6 Units Subcutaneous Q4H  . levofloxacin (LEVAQUIN) IV  750 mg Intravenous Q48H  . Levothyroxine Sodium  100 mcg Intravenous Q24H  . nystatin   Topical BID  . pantoprazole (PROTONIX) IV  40 mg Intravenous Daily  . sodium chloride  500 mL Intravenous Once  . sodium chloride  10-40 mL Intracatheter Q12H  . [START ON 04/21/2013] vancomycin  1,000 mg Intravenous Q24H   Assessment: 62 yo obese F admitted with HF exacerbation has now decompensated, on ventilator with suspicion for HAP.   Patient has  some ARI.  SCr is elevated.  Estimated Creatinine Clearance: 33.3 ml/min (by C-G formula based on Cr of 1.82). Vancomycin trough level is above goal.  Vancomycin clearance is worse than predicted.  Urine culture = no growth.  Levaquin 04/17/13>> Vancomycin 04/17/13>>  Goal of Therapy:  Vancomycin trough level 15-20 mcg/ml  Plan:  1) Change Levaquin 750mg  IV q48h 2  Change Vancomycin to 1000mg  IV Q24h 3) Re-check Vancomycin trough at steady state 4) Monitor renal function and cx data   Valrie Hart A 04/20/2013,10:13 AM

## 2013-04-20 NOTE — Progress Notes (Signed)
Back from OR at 0530

## 2013-04-20 NOTE — Progress Notes (Signed)
[facility] PROGRESS NOTE  GLENDALE WHERRY AVW:[id number] DOB: 1950/12/05 DOA: 04/14/2013 PCP: Pershing Proud  Assessment/Plan: 1. Acute right groin hematoma with pseudoaneurysm. Patient had significant bleeding from right groin when her femoral central line was discontinued yesterday. This is complicated by the fact that she was anticoagulated with Lovenox. Events overnight have been noted. Patient received protamine as well as FFP to reverse her coagulopathy. The patient had significant bleeding requiring transfusion of PRBCs and FFPs. Ultimately, she required surgical repair by Dr. Leticia Penna to achieve hemostasis. Her hemoglobin has since improved and bleeding appears to have stabilized. Pulses in lower extremity are difficult to palpate due to edema and hypotension. Right foot does not appear to be any cooler than the rest of her body. She is moving her legs spontaneously. We'll continue to follow. 2. Hemorrhagic shock. Due to bleeding from right groin site. Blood pressure still running on the low side. Hemoglobin has improved after transfusions. We'll give a small bolus of saline and restart on low-dose dopamine for now. Continue to follow serial hemoglobins. 3. Acute blood loss anemia. Due to bleeding from right groin site. Patient received 4 units of PRBCs and 2 units of FFP his overnight. Continue to follow serial hemoglobins. 4. Acute on chronic respiratory failure, multifactorial. Patient is currently on ventilator. With the chain of events overnight and her ongoing hypotension, it appears reasonable not to extubate the patient today. 5. Acute right-sided congestive heart failure. She continues to have evidence of heart failure on chest x-ray as well as peripheral edema. Diuresis will be challenging in light of hypotension. 6. Atrial fibrillation. Patient is having episodes of intermittent tachycardia. This may be secondary to volume related issues. She will be receiving a fluid bolus.  Anticoagulation has been discontinued due to bleeding from right groin site. 7. Acute renal failure. It appears to be improving with diuresis. 8. Type 2 diabetes. Blood sugars have been stable.   Code Status: Patient was made DO NOT RESUSCITATE by Dr. Karilyn Cota.; Family Communication: discussed with family at the bedside Disposition Plan: to be determined   Consultants:  [facility] cardiology  Nephrology, Dr. Kristian Covey  General surgery, Dr. Leticia Penna for central line placement  Procedures:  Intubation on 6/25  Central line placement on 6/25  Right groin exploration, suture repair of right femoral vein, suture repair artery, right radial arterial line placement 6/28 by Dr. Leticia Penna   Antibiotics: Vancomycin 6/25 Levofloxacin 6/25  HPI/Subjective: Patient on ventilator. She is awake. She answers questions. Appears to be calm. Denies any pain.  Objective: Filed Vitals:   04/20/13 0800 04/20/13 0815 04/20/13 0830 04/20/13 0845  BP:      Pulse: 102 101 100 102  Temp: 97.7 F (36.5 C)     TempSrc: Axillary     Resp: 20 20 6 20   Height:      Weight:      SpO2: 95% 98% 96% 95%    Intake/Output Summary (Last 24 hours) at 04/20/13 0904 Last data filed at 04/20/13 0800  Gross per 24 hour  Intake 7389.82 ml  Output   2650 ml  Net 4739.82 ml   Filed Weights   04/18/13 0500 04/19/13 0500 04/20/13 0600  Weight: 101 kg (222 lb 10.6 oz) 100.4 kg (221 lb 5.5 oz) 98.6 kg (217 lb 6 oz)    Exam:   General:  NAD  Cardiovascular: s1, s2, tachycardic, irregular  Respiratory: rhonchi bilaterally  Abdomen: soft, nt, nd, bs+  Musculoskeletal: 1+ edema b/l, right groin with dressing  in place which was not removed. Pedal pulses are difficult to palpate bilaterally.   Data Reviewed: Basic Metabolic Panel:  Recent Labs Lab 04/17/13 0447 04/18/13 0430 04/19/13 0416 04/19/13 2325 04/20/13 0531  NA 141 147* 143 139 140  K 5.3* 3.1* 3.4* 3.5 4.0  CL 100 101 94* 91* 93*  CO2  36* 42* 40* 38* 38*  GLUCOSE 122* 97 124* 200* 203*  BUN 47* 44* 39* 35* 38*  CREATININE 2.09* 1.76* 1.77* 1.64* 1.82*  CALCIUM 9.6 8.8 8.4 7.7* 7.4*  PHOS  --  1.6* 2.8  --   --    Liver Function Tests:  Recent Labs Lab 04/17/13 0447  AST 25  ALT 11  ALKPHOS 131*  BILITOT 0.7  PROT 7.8  ALBUMIN 3.4*   No results found for this basename: LIPASE, AMYLASE,  in the last 168 hours No results found for this basename: AMMONIA,  in the last 168 hours CBC:  Recent Labs Lab 04/14/13 1030 04/17/13 0447 04/18/13 0430 04/19/13 0416 04/19/13 1934 04/19/13 2325 04/20/13 0157 04/20/13 0531  WBC 7.9 8.9 8.5 8.3  --  9.3  --  9.9  NEUTROABS 5.1 6.0  --   --   --   --   --   --   HGB 13.1 12.9 11.3* 11.1* 10.0* 6.7* 8.5* 9.8*  HCT 44.1 46.7* 36.9 36.0 32.3* 21.5* 25.8* 27.9*  MCV 84.3 89.6 80.7 79.1  --  80.2  --  82.8  PLT 293 317 209 200  --  146*  --  101*   Cardiac Enzymes:  Recent Labs Lab 04/14/13 1030 04/14/13 1847 04/14/13 2353 04/15/13 0458  TROPONINI <0.30 <0.30 <0.30 <0.30   BNP (last 3 results)  Recent Labs  04/14/13 1030  PROBNP 12159.0*   CBG:  Recent Labs Lab 04/19/13 2013 04/19/13 2229 04/20/13 0010 04/20/13 0157 04/20/13 0532  GLUCAP 118* 170* 192* 198* 186*    Recent Results (from the past 240 hour(s))  MRSA PCR SCREENING     Status: None   Collection Time    04/17/13  6:18 AM      Result Value Range Status   MRSA by PCR NEGATIVE  NEGATIVE Final   Comment:            The GeneXpert MRSA Assay (FDA     approved for NASAL specimens     only), is one component of a     comprehensive MRSA colonization     surveillance program. It is not     intended to diagnose MRSA     infection nor to guide or     monitor treatment for     MRSA infections.  URINE CULTURE     Status: None   Collection Time    04/17/13 11:40 AM      Result Value Range Status   Specimen Description URINE, CLEAN CATCH   Final   Special Requests NONE   Final   Culture   Setup Time 04/17/2013 12:20   Final   Colony Count NO GROWTH   Final   Culture NO GROWTH   Final   Report Status 04/18/2013 FINAL   Final     Studies: [place] Lower Ext Art Right Ltd  04/20/2013   *RADIOLOGY REPORT*  Clinical Data: Expanding the right groin hematoma after catheter removal  RIGHT LOWER EXTREMITY ARTERIAL DUPLEX EVAL  Comparison: None.  Findings:  Provided gray scale images demonstrate a large mixed echogenic collection within the right groin measuring  approximately 20 x 14.5 x 12.6 cm.  There is layering echogenic debris within the dependent portion of this structure.  Provided images demonstrate apparent communication between this structure and the adjacent femoral artery though evaluation is somewhat degraded secondary to the large size of this mixed echogenic collection.   A small amount of apparent to and fro flow was noted within the apparent communication between the structure in the adjacent femoral artery (image 9).  A small amount of arterial flow is noted within the caudal aspect of this collection (image 5).  IMPRESSION: Large, approximately 20 cm narrow neck pseudoaneurysm/hematoma within the right groin adjacent to the femoral artery.  Ordering physician Dr. Leticia Penna there was present at the time of the ultrasound and is aware of the above findings.   Original Report Authenticated By: Tacey Ruiz, MD   Dg Chest Port 1 View  04/20/2013   *RADIOLOGY REPORT*  Clinical Data: Respiratory failure  PORTABLE CHEST - 1 VIEW  Comparison: Yesterday  Findings: Endotracheal tube and left PICC are stable.  Diffuse edema is worse.  Cardiomegaly.  Left basilar opacity.  No pneumothorax.  IMPRESSION: Worsening edema.   Original Report Authenticated By: Jolaine Click, M.D.   Dg Chest Port 1 View  04/19/2013   *RADIOLOGY REPORT*  Clinical Data: Status post PICC placement  PORTABLE CHEST - 1 VIEW  Comparison: April 18, 2013.  Findings: Stable moderate cardiomegaly.  Endotracheal tube is in grossly  good position and unchanged.  Central pulmonary vascular congestion is again noted.  Bilateral basilar opacities are again noted and unchanged.  There has been interval placement of left- sided PICC line with distal tip in the expected position of the right atrium.  IMPRESSION: Stable cardiomegaly and central pulmonary vascular congestion. Persistent bilateral basilar opacities which are unchanged. Interval placement of left-sided PICC line with distal tip in right atrium.  It is recommended to be withdrawn at least 2-3 cm.   Original Report Authenticated By: Lupita Raider.,  M.D.   Dg Chest Port 1 View  04/18/2013   *RADIOLOGY REPORT*  Clinical Data: Follow up pneumonia and CHF  PORTABLE CHEST - 1 VIEW  Comparison: Portable exam 0917 hours compared to 04/17/2013  Findings: Tip of endotracheal tube 3.0 cm above carina. Nasogastric tube extends into abdomen. Underpenetration at lung bases. Enlargement of cardiac silhouette. Pulmonary vascular congestion. Diffuse airspace and interstitial infiltrates consistent with pulmonary edema and CHF. Question coexistent consolidation or atelectasis in the lower lobes. No pneumothorax.  IMPRESSION: Diffuse pulmonary edema/CHF. Question coexistent consolidation versus atelectasis in the lower lobes.   Original Report Authenticated By: Ulyses Southward, M.D.    Scheduled Meds: . antiseptic oral rinse  15 mL Mouth Rinse QID  . bimatoprost  1 drop Both Eyes QHS  . chlorhexidine  15 mL Mouth Rinse BID  . furosemide  40 mg Intravenous BID  . insulin aspart  2-6 Units Subcutaneous Q4H  . levofloxacin (LEVAQUIN) IV  750 mg Intravenous Q48H  . Levothyroxine Sodium  100 mcg Intravenous Q24H  . nystatin   Topical BID  . pantoprazole (PROTONIX) IV  40 mg Intravenous Daily  . sodium chloride  10-40 mL Intracatheter Q12H  . vancomycin  1,500 mg Intravenous Q24H   Continuous Infusions: . sodium chloride 75 mL/hr at 04/20/13 0800  . DOPamine      Active Problems:   DIABETES  MELLITUS, TYPE II   HYPERLIPIDEMIA   Morbid obesity with BMI of 40.0-44.9, adult   HYPERTENSION   FIBRILLATION, ATRIAL  Long term current use of anticoagulant   CHF exacerbation   Acute respiratory failure   Acute renal failure   Acute diastolic heart failure   Hematoma of groin   Pseudoaneurysm    Time spent: critical care: 45 mins    Anna Beaird  [facility] Pager [phone]. If 7PM-7AM, please contact night-coverage at [email or web address], password [facility] 04/20/2013, 9:04 AM  LOS: 6 days

## 2013-04-20 NOTE — Op Note (Signed)
Patient:  Amber Armstrong  DOB:  [birth year]  MRN:  [id number]   Preop Diagnosis:  Expanding right groin hematoma, pseudoaneurysm  Postop Diagnosis:  The same  Procedure:  Right groin exploration, suture repair of right femoral vein, suture repair artery, right radial arterial line placement  Surgeon:  Dr. Tilford Pillar  Anes:  General endotracheal  Indications:  Patient is a 62 year old female who's been admitted to the intensive care respirator. She's been anticoagulated due to her history of atrial fibrillation. She's currently on therapeutic Lovenox. She had a previous central venous catheter placed in the right groin. This was placed several days ago. Patient had no significant issues and the catheter was functional. Today patient obtained a PICC line in the femoral catheter was removed. Upon removing a femoral catheter patient had significant groin bleeding. Initially this is localized at the skin level. Upon status formation the patient developed a expanding hematoma. Pressure was held by a nurse the nursing staff as well as myself. Ultrasound was also obtained and visible bleeding at the hematoma was visualized with the duplex. Impression with the ultrasound probe was attempted but failed. A sinus failure and continued need of blood products he was advised the patient be taken to the operating room. Patient's sister was fully advised the risks benefits alternatives. Consent was obtained.  Procedure note:  Patient was taken to the operating room was placed in supine position the or table. As patient was Re: intubated general anesthesia was administered.  At this point I did prep the patient's right wrist. An Allen's test was performed with good results. A right radial artery catheter was in place for continue monitoring. This was secured to the skin with a 2-0 silk. Good waveform was obtained.  The patient's lower abdomen and right groin were prepped with Betadine solution and draped in  standard fashion. Time out was performed. Vertical incision was created with a 10 blade scalpel with additional dissection through subcutaneous tissue carried out using electrocautery. Upon entering into the hematoma I encountered minimal clot. Significant liquid bright red blood was encountered. Approximately 700 mL plus was initially encountered. A continue my dissection downward. Identified the common femoral artery. Compression was held on the artery with as I dissected around to obtain proximal and distal control. A right angle clamp was utilized to dissect behind the artery and utilized to pass a vessel loop. Again a vessel loop was placed both proximally and distally.  While blood flow was controlled I noted that there was continued bleeding. The vessel was then moved laterally at which time I noted bleeding coming from an apparent venipuncture site in the right femoral vein. This was controlled by using a 4-0 Prolene to repair the venipuncture. While this did control bleeding from the vein I still noted considerable amount of active welling of blood. With continued dissection with the suction tip and is able to identify a branch artery that also appeared to have a laceration at the sidewall. Several additional 4-0 Prolene's were utilized to obtain repair and hemostasis. Noting that hemostasis was good at this point I opted to place a piece of Surgicel snow around the vessels to assist with hemostasis. I then turned my attention to closure.  The fascia was reapproximated using a 3-0 Vicryl and running continuous fashion. The deep subcuticular tumor tissue was also reapproximated using a 3-0 Vicryl suture in running continuous fashion. The wound is irrigated. Skin staples were utilized reapproximate the skin edges. Neosporin ointment was placed over the  incision and 4 x 4 gauze dressings were placed over the incision. The skin was washed and dried moist dry towel. The drapes removed the dressing was secured.  The patient was allowed to come out of general anesthetic. She was transferred to the ICU in stable condition.  At the conclusion of procedure all instrument, sponge, needle counts are correct. Patient received 2 additional packed RBCs in the operating room as well as 2 units of FFP. Urine output was 50 ML's during the case.  Complications:  None apparent  EBL:  (800 ML's initial, 300 ML's during the case)  Specimen:  None

## 2013-04-20 NOTE — Progress Notes (Signed)
0000 dr ziegler at bedside with patient holding pressure at fem. artery.

## 2013-04-20 NOTE — Progress Notes (Signed)
Patient off floor to OR 0315

## 2013-04-20 NOTE — Progress Notes (Signed)
SPRING SAN WGN:[id number] DOB: 02/12/1951 DOA: 04/14/2013  Nocturnist on-call event note: A signout was given to me by Dr. Kerry Hough, early last evening, advising Dr. Leticia Penna needs to be called for follow up if there was any recurrence of bleeding in this patient. Paged to see patient at 9:56 PM last night for recurrence of bleeding from the right groin, complication of a recently pulled femoral central line. On arrival grapefruit size hematoma noted in right groin and patient's systolic blood pressure of 72 with the first of 2 units of FFP in progress, and patient's last known hemoglobin of 10.0. Patient's right foot was pink and warm.  Nurse instructed to infuse 2 units FFP over 30 minutes and follow with 2 units of packed red cells, and to call Dr. Leticia Penna for followup  Immediately.   Dr. Leticia Penna was called and recommended  sandbag to the area.  Page by Dr. Marchelle Gearing with [facility] at 10:54 PM. Hematoma was reportedly getting bigger and he felt this was an arterial bleed and he felt that my presence at the bedside may help to slow the bleeding. He also felt I may have some influence on Dr. Leticia Penna to get him to come to see the patient, as he felt surgical intervention may be needed.  On arrival to the bedside the patient's systolic blood pressure was now 86, she remained alert, FFP was still being infused as there was a delay in getting a second unit thawed. Nurses were applying manual pressure to the right groin. Situation was discussed immediately with Dr. Leticia Penna who did not feel this was an arterial bleed and felt manual pressure was adequate. I relayed Dr. Jane Canary opinion that this was an arterial bleed and probably needed exploration, and Dr. Leticia Penna agreed to come in to see the patient, but expressed concern that there was no vascular surgeon at [facility].  Called back to Dr. Marchelle Gearing shortly after this and expressed my opinion that I felt the patient would be better off transfered  to [facility] for vascular surgery  despite her hypotension; I felt what was being done at the bedside could be done in the [facility] ambulance en route to [facility]. He still felt felt the patient was too unstable for transfer. Dr. Leticia Penna arrived shortly after and took over resuscitation of the patient, requested the ultrasound technician comment to Doppler the area and eventually took the patient to surgery for surgical repair.  Reshonda Koerber  04/20/2013 7:47 AM

## 2013-04-20 NOTE — Transfer of Care (Signed)
Immediate Anesthesia Transfer of Care Note  Patient: Amber Armstrong  Procedure(s) Performed: Procedure(s): GROIN EXPLORATION (Right)  Patient Location: ICU  Anesthesia Type:General  Level of Consciousness: sedated, patient cooperative and responds to stimulation  Airway & Oxygen Therapy: Patient remains intubated per anesthesia plan  Post-op Assessment: Report given to PACU RN  Post vital signs: Reviewed and stable  Complications: No apparent anesthesia complications

## 2013-04-20 NOTE — Progress Notes (Signed)
Subjective: Events of yesterday are noted she is more stable now. Her blood pressure is still marginal. She is back on full ventilator support and I don't plan to make any attempts at weaning today considering the difficulty  yesterday  Objective: Vital signs in last 24 hours: Temp:  [96 F (35.6 C)-97.8 F (36.6 C)] 97.5 F (36.4 C) (06/28 0556) Pulse Rate:  [54-128] 101 (06/28 0815) Resp:  [14-21] 20 (06/28 0815) BP: (35-139)/(26-96) 71/36 mmHg (06/28 0556) SpO2:  [75 %-100 %] 98 % (06/28 0815) Arterial Line BP: (63-90)/(41-62) 63/41 mmHg (06/28 0815) FiO2 (%):  [44.3 %-60.2 %] 45 % (06/28 0815) Weight:  [98.6 kg (217 lb 6 oz)] 98.6 kg (217 lb 6 oz) (06/28 0600) Weight change: -1.8 kg (-3 lb 15.5 oz) Last BM Date: 04/14/13  Intake/Output from previous day: 06/27 0701 - 06/28 0700 In: 7474.8 [I.V.:1033.8; Blood:3191.1; IV Piggyback:1650] Out: 2650 [Urine:1650; Blood:1000]  PHYSICAL EXAM General appearance: alert, mild distress and Intubated and sedated but responsive Resp: rhonchi bilaterally Cardio: irregularly irregular rhythm GI: soft, non-tender; bowel sounds normal; no masses,  no organomegaly Extremities: Still mild edema surgical site at the area of the right femoral vein  Lab Results:    Basic Metabolic Panel:  Recent Labs  16/10/96 0430 04/19/13 0416 04/19/13 2325 04/20/13 0531  NA 147* 143 139 140  K 3.1* 3.4* 3.5 4.0  CL 101 94* 91* 93*  CO2 42* 40* 38* 38*  GLUCOSE 97 124* 200* 203*  BUN 44* 39* 35* 38*  CREATININE 1.76* 1.77* 1.64* 1.82*  CALCIUM 8.8 8.4 7.7* 7.4*  PHOS 1.6* 2.8  --   --    Liver Function Tests: No results found for this basename: AST, ALT, ALKPHOS, BILITOT, PROT, ALBUMIN,  in the last 72 hours No results found for this basename: LIPASE, AMYLASE,  in the last 72 hours No results found for this basename: AMMONIA,  in the last 72 hours CBC:  Recent Labs  04/19/13 2325 04/20/13 0157 04/20/13 0531  WBC 9.3  --  9.9  HGB 6.7*  8.5* 9.8*  HCT 21.5* 25.8* 27.9*  MCV 80.2  --  82.8  PLT 146*  --  101*   Cardiac Enzymes: No results found for this basename: CKTOTAL, CKMB, CKMBINDEX, TROPONINI,  in the last 72 hours BNP: No results found for this basename: PROBNP,  in the last 72 hours D-Dimer: No results found for this basename: DDIMER,  in the last 72 hours CBG:  Recent Labs  04/19/13 1840 04/19/13 2013 04/19/13 2229 04/20/13 0010 04/20/13 0157 04/20/13 0532  GLUCAP 96 118* 170* 192* 198* 186*   Hemoglobin A1C: No results found for this basename: HGBA1C,  in the last 72 hours Fasting Lipid Panel: No results found for this basename: CHOL, HDL, LDLCALC, TRIG, CHOLHDL, LDLDIRECT,  in the last 72 hours Thyroid Function Tests: No results found for this basename: TSH, T4TOTAL, FREET4, T3FREE, THYROIDAB,  in the last 72 hours Anemia Panel: No results found for this basename: VITAMINB12, FOLATE, FERRITIN, TIBC, IRON, RETICCTPCT,  in the last 72 hours Coagulation:  Recent Labs  04/19/13 0416 04/20/13 0531  LABPROT 17.0* 17.8*  INR 1.42 1.51*   Urine Drug Screen: Drugs of Abuse  No results found for this basename: labopia, cocainscrnur, labbenz, amphetmu, thcu, labbarb    Alcohol Level: No results found for this basename: ETH,  in the last 72 hours Urinalysis:  Recent Labs  04/17/13 1127  COLORURINE YELLOW  LABSPEC 1.010  PHURINE 5.5  GLUCOSEU  NEGATIVE  HGBUR LARGE*  BILIRUBINUR NEGATIVE  KETONESUR NEGATIVE  PROTEINUR NEGATIVE  UROBILINOGEN 0.2  NITRITE NEGATIVE  LEUKOCYTESUR MODERATE*   Misc. Labs:  ABGS  Recent Labs  04/19/13 0635  PHART 7.595*  PO2ART 196.0*  TCO2 34.0  HCO3 38.4*   CULTURES Recent Results (from the past 240 hour(s))  MRSA PCR SCREENING     Status: None   Collection Time    04/17/13  6:18 AM      Result Value Range Status   MRSA by PCR NEGATIVE  NEGATIVE Final   Comment:            The GeneXpert MRSA Assay (FDA     approved for NASAL specimens      only), is one component of a     comprehensive MRSA colonization     surveillance program. It is not     intended to diagnose MRSA     infection nor to guide or     monitor treatment for     MRSA infections.  URINE CULTURE     Status: None   Collection Time    04/17/13 11:40 AM      Result Value Range Status   Specimen Description URINE, CLEAN CATCH   Final   Special Requests NONE   Final   Culture  Setup Time 04/17/2013 12:20   Final   Colony Count NO GROWTH   Final   Culture NO GROWTH   Final   Report Status 04/18/2013 FINAL   Final   Studies/Results: [place] Lower Ext Art Right Ltd  04/20/2013   *RADIOLOGY REPORT*  Clinical Data: Expanding the right groin hematoma after catheter removal  RIGHT LOWER EXTREMITY ARTERIAL DUPLEX EVAL  Comparison: None.  Findings:  Provided gray scale images demonstrate a large mixed echogenic collection within the right groin measuring approximately 20 x 14.5 x 12.6 cm.  There is layering echogenic debris within the dependent portion of this structure.  Provided images demonstrate apparent communication between this structure and the adjacent femoral artery though evaluation is somewhat degraded secondary to the large size of this mixed echogenic collection.   A small amount of apparent to and fro flow was noted within the apparent communication between the structure in the adjacent femoral artery (image 9).  A small amount of arterial flow is noted within the caudal aspect of this collection (image 5).  IMPRESSION: Large, approximately 20 cm narrow neck pseudoaneurysm/hematoma within the right groin adjacent to the femoral artery.  Ordering physician Dr. Leticia Penna there was present at the time of the ultrasound and is aware of the above findings.   Original Report Authenticated By: Tacey Ruiz, MD   Dg Chest Port 1 View  04/19/2013   *RADIOLOGY REPORT*  Clinical Data: Status post PICC placement  PORTABLE CHEST - 1 VIEW  Comparison: April 18, 2013.  Findings: Stable  moderate cardiomegaly.  Endotracheal tube is in grossly good position and unchanged.  Central pulmonary vascular congestion is again noted.  Bilateral basilar opacities are again noted and unchanged.  There has been interval placement of left- sided PICC line with distal tip in the expected position of the right atrium.  IMPRESSION: Stable cardiomegaly and central pulmonary vascular congestion. Persistent bilateral basilar opacities which are unchanged. Interval placement of left-sided PICC line with distal tip in right atrium.  It is recommended to be withdrawn at least 2-3 cm.   Original Report Authenticated By: Lupita Raider.,  M.D.   [facility]  1 View  04/18/2013   *RADIOLOGY REPORT*  Clinical Data: Follow up pneumonia and CHF  PORTABLE CHEST - 1 VIEW  Comparison: Portable exam 0917 hours compared to 04/17/2013  Findings: Tip of endotracheal tube 3.0 cm above carina. Nasogastric tube extends into abdomen. Underpenetration at lung bases. Enlargement of cardiac silhouette. Pulmonary vascular congestion. Diffuse airspace and interstitial infiltrates consistent with pulmonary edema and CHF. Question coexistent consolidation or atelectasis in the lower lobes. No pneumothorax.  IMPRESSION: Diffuse pulmonary edema/CHF. Question coexistent consolidation versus atelectasis in the lower lobes.   Original Report Authenticated By: Ulyses Southward, M.D.    Medications:  Prior to Admission:  Prescriptions prior to admission  Medication Sig Dispense Refill  . furosemide (LASIX) 20 MG tablet Take 20 mg by mouth 2 (two) times daily.      Marland Kitchen glimepiride (AMARYL) 2 MG tablet Take 2 mg by mouth daily before breakfast.      . insulin glargine (LANTUS) 100 UNIT/ML injection Inject 30 Units into the skin at bedtime.       Marland Kitchen levothyroxine (SYNTHROID, LEVOTHROID) 200 MCG tablet Take 200 mcg by mouth daily before breakfast.      . metoprolol (TOPROL-XL) 200 MG 24 hr tablet TAKE ONE TABLET BY MOUTH EVERY DAY  30 tablet  0  .  TAZTIA XT 360 MG 24 hr capsule TAKE ONE CAPSULE BY MOUTH EVERY DAY  90 capsule  0  . warfarin (COUMADIN) 2.5 MG tablet Take 2.5-5 mg by mouth See admin instructions. Take 1 tablet daily except for Wednesday pt takes 2 tablets      . bimatoprost (LUMIGAN) 0.03 % ophthalmic solution Place 1 drop into both eyes at bedtime.       Scheduled: . antiseptic oral rinse  15 mL Mouth Rinse QID  . bimatoprost  1 drop Both Eyes QHS  . chlorhexidine  15 mL Mouth Rinse BID  . furosemide  40 mg Intravenous BID  . insulin aspart  2-6 Units Subcutaneous Q4H  . levofloxacin (LEVAQUIN) IV  750 mg Intravenous Q48H  . Levothyroxine Sodium  100 mcg Intravenous Q24H  . metoprolol  5 mg Intravenous Q6H  . nystatin   Topical BID  . pantoprazole (PROTONIX) IV  40 mg Intravenous Daily  . sodium chloride  10-40 mL Intracatheter Q12H  . vancomycin  1,500 mg Intravenous Q24H   Continuous: . sodium chloride 75 mL/hr at 04/20/13 0600  . DOPamine     ZOX:[id number], midazolam, morphine injection, ondansetron (ZOFRAN) IV, sodium chloride  Assesment: She was admitted with acute on chronic respiratory failure. She is felt to have right heart failure/cor pulmonale. She has chronic atrial fibrillation. She has diabetes. She had problems with an expanding right groin hematoma and pseudoaneurysm. She is more stable now but still somewhat hypotensive she remains intubated and on mechanical ventilation Active Problems:   DIABETES MELLITUS, TYPE II   HYPERLIPIDEMIA   Morbid obesity with BMI of 40.0-44.9, adult   HYPERTENSION   FIBRILLATION, ATRIAL   Long term current use of anticoagulant   CHF exacerbation   Acute respiratory failure   Acute renal failure   Acute diastolic heart failure    Plan: Continue current treatments no weaning attempts today    LOS: 6 days   Addilee Neu L 04/20/2013, 8:31 AM

## 2013-04-20 NOTE — Progress Notes (Signed)
Rate decreased to 18, changing to adjust ph

## 2013-04-20 NOTE — Anesthesia Postprocedure Evaluation (Signed)
  Anesthesia Post-op Note  Patient: Amber Armstrong  Procedure(s) Performed: Procedure(s): GROIN EXPLORATION (Right)  Patient Location: ICU  Anesthesia Type:General  Level of Consciousness: sedated and patient cooperative  Airway and Oxygen Therapy: Remains intubated  Post-op Pain: none  Post-op Assessment:   Post-op Vital Signs: stable  Complications: No apparent anesthesia complications

## 2013-04-20 NOTE — Progress Notes (Signed)
Subjective: Interval History: Patient presently sedated and sleepy. Last night episode of bleeding from her right femoral vein was noted. Presently bleeding has been controlled  Objective: Vital signs in last 24 hours: Temp:  [96 F (35.6 C)-97.8 F (36.6 C)] 97.5 F (36.4 C) (06/28 0556) Pulse Rate:  [54-141] 109 (06/28 0630) Resp:  [14-22] 20 (06/28 0630) BP: (35-148)/(26-96) 71/36 mmHg (06/28 0556) SpO2:  [75 %-100 %] 94 % (06/28 0630) Arterial Line BP: (69-89)/(48-60) 89/60 mmHg (06/28 0630) FiO2 (%):  [44.3 %-70.3 %] 45 % (06/28 0630) Weight:  [98.6 kg (217 lb 6 oz)] 98.6 kg (217 lb 6 oz) (06/28 0600) Weight change: -1.8 kg (-3 lb 15.5 oz)  Intake/Output from previous day: 06/27 0701 - 06/28 0700 In: 7474.8 [I.V.:1033.8; Blood:3191.1; IV Piggyback:1650] Out: 2650 [Urine:1650; Blood:1000] Intake/Output this shift:    Generally patient is sleepy but arousable. Chest decreased breath sound bilaterally and anteriorly. She has also some inspiratory crackles bilaterally. Heart irregular rate and rhythm no murmur Abdomen soft positive bowel sound Extremities she has 1+ lower extremity edema however she has significant upper extremity edema.   Lab Results:  Recent Labs  04/19/13 2325 04/20/13 0157 04/20/13 0531  WBC 9.3  --  9.9  HGB 6.7* 8.5* 9.8*  HCT 21.5* 25.8* 27.9*  PLT 146*  --  101*   BMET:   Recent Labs  04/19/13 2325 04/20/13 0531  NA 139 140  K 3.5 4.0  CL 91* 93*  CO2 38* 38*  GLUCOSE 200* 203*  BUN 35* 38*  CREATININE 1.64* 1.82*  CALCIUM 7.7* 7.4*   No results found for this basename: PTH,  in the last 72 hours Iron Studies: No results found for this basename: IRON, TIBC, TRANSFERRIN, FERRITIN,  in the last 72 hours  Studies/Results: [place] Lower Ext Art Right Ltd  04/20/2013   *RADIOLOGY REPORT*  Clinical Data: Expanding the right groin hematoma after catheter removal  RIGHT LOWER EXTREMITY ARTERIAL DUPLEX EVAL  Comparison: None.  Findings:   Provided gray scale images demonstrate a large mixed echogenic collection within the right groin measuring approximately 20 x 14.5 x 12.6 cm.  There is layering echogenic debris within the dependent portion of this structure.  Provided images demonstrate apparent communication between this structure and the adjacent femoral artery though evaluation is somewhat degraded secondary to the large size of this mixed echogenic collection.   A small amount of apparent to and fro flow was noted within the apparent communication between the structure in the adjacent femoral artery (image 9).  A small amount of arterial flow is noted within the caudal aspect of this collection (image 5).  IMPRESSION: Large, approximately 20 cm narrow neck pseudoaneurysm/hematoma within the right groin adjacent to the femoral artery.  Ordering physician Dr. Leticia Penna there was present at the time of the ultrasound and is aware of the above findings.   Original Report Authenticated By: Tacey Ruiz, MD   Dg Chest Port 1 View  04/19/2013   *RADIOLOGY REPORT*  Clinical Data: Status post PICC placement  PORTABLE CHEST - 1 VIEW  Comparison: April 18, 2013.  Findings: Stable moderate cardiomegaly.  Endotracheal tube is in grossly good position and unchanged.  Central pulmonary vascular congestion is again noted.  Bilateral basilar opacities are again noted and unchanged.  There has been interval placement of left- sided PICC line with distal tip in the expected position of the right atrium.  IMPRESSION: Stable cardiomegaly and central pulmonary vascular congestion. Persistent bilateral basilar opacities  which are unchanged. Interval placement of left-sided PICC line with distal tip in right atrium.  It is recommended to be withdrawn at least 2-3 cm.   Original Report Authenticated By: Lupita Raider.,  M.D.   Dg Chest Port 1 View  04/18/2013   *RADIOLOGY REPORT*  Clinical Data: Follow up pneumonia and CHF  PORTABLE CHEST - 1 VIEW  Comparison:  Portable exam 0917 hours compared to 04/17/2013  Findings: Tip of endotracheal tube 3.0 cm above carina. Nasogastric tube extends into abdomen. Underpenetration at lung bases. Enlargement of cardiac silhouette. Pulmonary vascular congestion. Diffuse airspace and interstitial infiltrates consistent with pulmonary edema and CHF. Question coexistent consolidation or atelectasis in the lower lobes. No pneumothorax.  IMPRESSION: Diffuse pulmonary edema/CHF. Question coexistent consolidation versus atelectasis in the lower lobes.   Original Report Authenticated By: Ulyses Southward, M.D.    I have reviewed the patient's current medications.  Assessment/Plan: Problem #1 acute kidney injury superimposed on chronic her BUN and creatinine presently is increasing. Most likely secondary to ATN associated with hypotension. Patient is none oliguric.  Problem #2 CHF patient on Lasix was good urine output. Problem #3 hypokalemia related most likely to her diuretics. Her potassium is improving. Problem #4 diabetes Problem #5 a trial fibrillation her heart rate is controlled Problem #6 respiratory failure possibly combination of CHF and also pneumonia. Patient is presently remains intubated. Chest x-ray showed no significant change in her infiltrate. Problem #7 hyperlipidemia Problem #8 obesity Problem #9 hypertension presently patient blood pressure seems to be low normal. Possibly from bleeding. Problem #10 hypernatremia most likely secondary to lack of free water and have normal saline.  Plan: We'll continue his present management We'll check her basic metabolic panel in the morning.     LOS: 6 days   Humphrey Guerreiro S 04/20/2013,7:43 AM

## 2013-04-20 NOTE — Preoperative (Signed)
Beta Blockers   Reason not to administer Beta Blockers:Hold beta blocker due to hypotension, Hold beta blocker due to hypovolemia 

## 2013-04-20 NOTE — Anesthesia Procedure Notes (Signed)
Performed by: Carolyne Littles, Latravious Levitt L

## 2013-04-20 NOTE — Progress Notes (Signed)
AM dose of lasix held this am due to low BP. MD aware.  Pt due to get 40mg  of IV Lasix at 1800. Pts BP 80's/40's. Dr Kerry Hough paged and made aware. Order to not give Lasix dose tonight. Will continue to monitor.

## 2013-04-20 NOTE — Progress Notes (Signed)
Patient ID: KITZIA CAMUS, female   DOB: 05-Jun-1951, 62 y.o.   MRN: [id number]  Called to see patient due to bleeding after removal of CVC in right groin.  Actively anticoagulated on therapeutic Lovenox.  Last dose this AM.  NOted to have bleeding through the afternoon.  FFP given.  I was called to see patient at approximately 2300.  Noted large groin hematoma on right.  Pressure held by me for over 30 minutes while [place] tech was called in.  While critical care MD reportedly contacted vascular surgery, patient is not hemodynamically stable for transfer to [facility].    Once [place] tech was here, Duplex of the right groin was performed.  Active bleeding was seen entering into the hematoma.  Point pressure was held without resolution.  Have discussed proceeding to the operating room with the patient's sister.  Will proceed at this time with groin exploration.    Patient has received 2 units FFP and 2 units of PRBCs.  2 additional units of each have been ordered.  There is 6 more units of PRBCs available after these 4 have been given.

## 2013-04-21 ENCOUNTER — Inpatient Hospital Stay (HOSPITAL_COMMUNITY): Payer: BC Managed Care – PPO

## 2013-04-21 LAB — CBC
HCT: 21.9 % — ABNORMAL LOW (ref 36.0–46.0)
Hemoglobin: 7.6 g/dL — ABNORMAL LOW (ref 12.0–15.0)
Hemoglobin: 8.9 g/dL — ABNORMAL LOW (ref 12.0–15.0)
MCH: 28.7 pg (ref 26.0–34.0)
MCH: 29.5 pg (ref 26.0–34.0)
MCH: 29.8 pg (ref 26.0–34.0)
MCHC: 34.2 g/dL (ref 30.0–36.0)
MCV: 83.9 fL (ref 78.0–100.0)
MCV: 85.7 fL (ref 78.0–100.0)
MCV: 87 fL (ref 78.0–100.0)
Platelets: 94 10*3/uL — ABNORMAL LOW (ref 150–400)
Platelets: 96 10*3/uL — ABNORMAL LOW (ref 150–400)
RBC: 2.58 MIL/uL — ABNORMAL LOW (ref 3.87–5.11)
RBC: 2.99 MIL/uL — ABNORMAL LOW (ref 3.87–5.11)
RDW: 16.1 % — ABNORMAL HIGH (ref 11.5–15.5)
WBC: 12.4 10*3/uL — ABNORMAL HIGH (ref 4.0–10.5)
WBC: 13.5 10*3/uL — ABNORMAL HIGH (ref 4.0–10.5)

## 2013-04-21 LAB — PREPARE FRESH FROZEN PLASMA
Unit division: 0
Unit division: 0

## 2013-04-21 LAB — BASIC METABOLIC PANEL
BUN: 40 mg/dL — ABNORMAL HIGH (ref 6–23)
CO2: 38 mEq/L — ABNORMAL HIGH (ref 19–32)
Chloride: 96 mEq/L (ref 96–112)
Creatinine, Ser: 1.89 mg/dL — ABNORMAL HIGH (ref 0.50–1.10)
GFR calc Af Amer: 32 mL/min — ABNORMAL LOW (ref >90)
Potassium: 3 mEq/L — ABNORMAL LOW (ref 3.5–5.1)

## 2013-04-21 LAB — BLOOD GAS, ARTERIAL
Acid-Base Excess: 12.1 mmol/L — ABNORMAL HIGH (ref 0.0–2.0)
Bicarbonate: 35.7 mEq/L — ABNORMAL HIGH (ref 20.0–24.0)
FIO2: 0.45 %
O2 Saturation: 96 %
Patient temperature: 37
pO2, Arterial: 73.4 mmHg — ABNORMAL LOW (ref 80.0–100.0)

## 2013-04-21 LAB — PROTIME-INR: INR: 1.25 (ref 0.00–1.49)

## 2013-04-21 LAB — GLUCOSE, CAPILLARY
Glucose-Capillary: 119 mg/dL — ABNORMAL HIGH (ref 70–99)
Glucose-Capillary: 135 mg/dL — ABNORMAL HIGH (ref 70–99)

## 2013-04-21 LAB — PREPARE RBC (CROSSMATCH)

## 2013-04-21 MED ORDER — SODIUM CHLORIDE 0.45 % IV SOLN: INTRAVENOUS | Status: DC

## 2013-04-21 MED ORDER — FUROSEMIDE 10 MG/ML IJ SOLN
8.0000 mg/h | INTRAVENOUS | Status: DC
  Administered 2013-04-21: 8 mg/h via INTRAVENOUS
  Filled 2013-04-21: qty 25

## 2013-04-21 MED ORDER — POTASSIUM CHLORIDE 10 MEQ/100ML IV SOLN
10.0000 meq | INTRAVENOUS | Status: AC
  Administered 2013-04-21 (×2): 10 meq via INTRAVENOUS
  Filled 2013-04-21: qty 400

## 2013-04-21 MED ORDER — POTASSIUM CHLORIDE 10 MEQ/100ML IV SOLN
10.0000 meq | INTRAVENOUS | Status: AC
  Administered 2013-04-21 (×2): 10 meq via INTRAVENOUS

## 2013-04-21 MED ORDER — SODIUM CHLORIDE 0.45 % IV SOLN
INTRAVENOUS | Status: AC
  Administered 2013-04-21: 16:00:00 via INTRAVENOUS
  Filled 2013-04-21 (×6): qty 1000

## 2013-04-21 NOTE — Anesthesia Postprocedure Evaluation (Signed)
  Anesthesia Post-op Note  Patient: Amber Armstrong  Procedure(s) Performed: Procedure(s): GROIN EXPLORATION (Right)  Patient Location: ICU  Anesthesia Type:General  Level of Consciousness: sedated  Airway and Oxygen Therapy: Patient remains intubated per anesthesia plan  Post-op Pain: none  Post-op Assessment: Post-op Vital signs reviewed, Patient's Cardiovascular Status Stable, Respiratory Function Stable, No signs of Nausea or vomiting and Pain level controlled  Post-op Vital Signs: Reviewed and stable  Complications: No apparent anesthesia complications;  Patient remains intubated; CXR reveals worsening pulmonary edema; O2 sats 99-100%; SBP running 90's-100's; was on Dopamine infusion but that has been DC'd.;  When awake, patient seems to be appropriate per RN

## 2013-04-21 NOTE — Progress Notes (Signed)
Subjective: She remains intubated on the ventilator. Her blood pressure has improved. She is still anemic. She is probably volume overloaded again from fluid resuscitation because of her hemorrhagic shock  Objective: Vital signs in last 24 hours: Temp:  [97.6 F (36.4 C)-99.2 F (37.3 C)] 98.3 F (36.8 C) (06/29 0800) Pulse Rate:  [67-146] 110 (06/29 0730) Resp:  [3-24] 20 (06/29 0730) SpO2:  [83 %-100 %] 100 % (06/29 0730) Arterial Line BP: (60-134)/(37-74) 86/45 mmHg (06/29 0615) FiO2 (%):  [44.6 %-99.6 %] 45.1 % (06/29 0730) Weight:  [102.2 kg (225 lb 5 oz)] 102.2 kg (225 lb 5 oz) (06/29 0500) Weight change: 3.6 kg (7 lb 15 oz) Last BM Date: 04/14/13  Intake/Output from previous day: 06/28 0701 - 06/29 0700 In: 1925 [I.V.:1725; IV Piggyback:200] Out: 1000 [Urine:1000]  PHYSICAL EXAM General appearance: alert, cooperative, no distress and morbidly obese Resp: rhonchi bilaterally Cardio: irregularly irregular rhythm GI: soft, non-tender; bowel sounds normal; no masses,  no organomegaly Extremities: 2+ edema  Lab Results:    Basic Metabolic Panel:  Recent Labs  16/10/96 0416  04/20/13 0531 04/21/13 0508  NA 143  < > 140 142  K 3.4*  < > 4.0 3.0*  CL 94*  < > 93* 96  CO2 40*  < > 38* 38*  GLUCOSE 124*  < > 203* 126*  BUN 39*  < > 38* 40*  CREATININE 1.77*  < > 1.82* 1.89*  CALCIUM 8.4  < > 7.4* 7.2*  PHOS 2.8  --   --   --   < > = values in this interval not displayed. Liver Function Tests: No results found for this basename: AST, ALT, ALKPHOS, BILITOT, PROT, ALBUMIN,  in the last 72 hours No results found for this basename: LIPASE, AMYLASE,  in the last 72 hours No results found for this basename: AMMONIA,  in the last 72 hours CBC:  Recent Labs  04/21/13 0133 04/21/13 0900  WBC 10.1 12.4*  HGB 7.5* 7.6*  HCT 21.9* 22.1*  MCV 83.9 85.7  PLT 92* PENDING   Cardiac Enzymes: No results found for this basename: CKTOTAL, CKMB, CKMBINDEX, TROPONINI,  in  the last 72 hours BNP: No results found for this basename: PROBNP,  in the last 72 hours D-Dimer: No results found for this basename: DDIMER,  in the last 72 hours CBG:  Recent Labs  04/20/13 1117 04/20/13 1604 04/20/13 1949 04/20/13 2357 04/21/13 0350 04/21/13 0744  GLUCAP 112* 99 102* 118* 119* 135*   Hemoglobin A1C: No results found for this basename: HGBA1C,  in the last 72 hours Fasting Lipid Panel: No results found for this basename: CHOL, HDL, LDLCALC, TRIG, CHOLHDL, LDLDIRECT,  in the last 72 hours Thyroid Function Tests: No results found for this basename: TSH, T4TOTAL, FREET4, T3FREE, THYROIDAB,  in the last 72 hours Anemia Panel: No results found for this basename: VITAMINB12, FOLATE, FERRITIN, TIBC, IRON, RETICCTPCT,  in the last 72 hours Coagulation:  Recent Labs  04/20/13 0531 04/21/13 0508  LABPROT 17.8* 15.4*  INR 1.51* 1.25   Urine Drug Screen: Drugs of Abuse  No results found for this basename: labopia, cocainscrnur, labbenz, amphetmu, thcu, labbarb    Alcohol Level: No results found for this basename: ETH,  in the last 72 hours Urinalysis: No results found for this basename: COLORURINE, APPERANCEUR, LABSPEC, PHURINE, GLUCOSEU, HGBUR, BILIRUBINUR, KETONESUR, PROTEINUR, UROBILINOGEN, NITRITE, LEUKOCYTESUR,  in the last 72 hours Misc. Labs:  ABGS  Recent Labs  04/21/13 0430  PHART  7.549*  PO2ART 73.4*  TCO2 33.4  HCO3 35.7*   CULTURES Recent Results (from the past 240 hour(s))  MRSA PCR SCREENING     Status: None   Collection Time    04/17/13  6:18 AM      Result Value Range Status   MRSA by PCR NEGATIVE  NEGATIVE Final   Comment:            The GeneXpert MRSA Assay (FDA     approved for NASAL specimens     only), is one component of a     comprehensive MRSA colonization     surveillance program. It is not     intended to diagnose MRSA     infection nor to guide or     monitor treatment for     MRSA infections.  URINE CULTURE      Status: None   Collection Time    04/17/13 11:40 AM      Result Value Range Status   Specimen Description URINE, CLEAN CATCH   Final   Special Requests NONE   Final   Culture  Setup Time 04/17/2013 12:20   Final   Colony Count NO GROWTH   Final   Culture NO GROWTH   Final   Report Status 04/18/2013 FINAL   Final   Studies/Results: [place] Lower Ext Art Right Ltd  04/20/2013   *RADIOLOGY REPORT*  Clinical Data: Expanding the right groin hematoma after catheter removal  RIGHT LOWER EXTREMITY ARTERIAL DUPLEX EVAL  Comparison: None.  Findings:  Provided gray scale images demonstrate a large mixed echogenic collection within the right groin measuring approximately 20 x 14.5 x 12.6 cm.  There is layering echogenic debris within the dependent portion of this structure.  Provided images demonstrate apparent communication between this structure and the adjacent femoral artery though evaluation is somewhat degraded secondary to the large size of this mixed echogenic collection.   A small amount of apparent to and fro flow was noted within the apparent communication between the structure in the adjacent femoral artery (image 9).  A small amount of arterial flow is noted within the caudal aspect of this collection (image 5).  IMPRESSION: Large, approximately 20 cm narrow neck pseudoaneurysm/hematoma within the right groin adjacent to the femoral artery.  Ordering physician Dr. Leticia Penna there was present at the time of the ultrasound and is aware of the above findings.   Original Report Authenticated By: Tacey Ruiz, MD   Dg Chest Port 1 View  04/20/2013   *RADIOLOGY REPORT*  Clinical Data: Respiratory failure  PORTABLE CHEST - 1 VIEW  Comparison: Yesterday  Findings: Endotracheal tube and left PICC are stable.  Diffuse edema is worse.  Cardiomegaly.  Left basilar opacity.  No pneumothorax.  IMPRESSION: Worsening edema.   Original Report Authenticated By: Jolaine Click, M.D.   Dg Chest Port 1 View  04/19/2013    *RADIOLOGY REPORT*  Clinical Data: Status post PICC placement  PORTABLE CHEST - 1 VIEW  Comparison: April 18, 2013.  Findings: Stable moderate cardiomegaly.  Endotracheal tube is in grossly good position and unchanged.  Central pulmonary vascular congestion is again noted.  Bilateral basilar opacities are again noted and unchanged.  There has been interval placement of left- sided PICC line with distal tip in the expected position of the right atrium.  IMPRESSION: Stable cardiomegaly and central pulmonary vascular congestion. Persistent bilateral basilar opacities which are unchanged. Interval placement of left-sided PICC line with distal tip in right atrium.  It is recommended to be withdrawn at least 2-3 cm.   Original Report Authenticated By: Lupita Raider.,  M.D.    Medications:  Scheduled: . antiseptic oral rinse  15 mL Mouth Rinse QID  . bimatoprost  1 drop Both Eyes QHS  . chlorhexidine  15 mL Mouth Rinse BID  . insulin aspart  2-6 Units Subcutaneous Q4H  . levofloxacin (LEVAQUIN) IV  750 mg Intravenous Q48H  . Levothyroxine Sodium  100 mcg Intravenous Q24H  . nystatin   Topical BID  . pantoprazole (PROTONIX) IV  40 mg Intravenous Daily  . potassium chloride  10 mEq Intravenous Q1 Hr x 4  . sodium chloride  10-40 mL Intracatheter Q12H  . vancomycin  1,000 mg Intravenous Q24H   Continuous: . DOPamine Stopped (04/20/13 1005)  . furosemide (LASIX) infusion    . sodium chloride 0.45 % 1,000 mL with potassium chloride 40 mEq infusion     RUE:[id number], midazolam, morphine injection, ondansetron (ZOFRAN) IV, sodium chloride  Assesment: She has acute on chronic respiratory failure on the ventilator. I agree her chest x-ray looks worse. I think this is likely due to fluid resuscitation for hemorrhagic shock. She is better as far as the hemorrhagic shock is concerned. I agreed another unit of packed red blood cells will probably improve her situation. Active Problems:   DIABETES MELLITUS,  TYPE II   HYPERLIPIDEMIA   Morbid obesity with BMI of 40.0-44.9, adult   HYPERTENSION   FIBRILLATION, ATRIAL   Long term current use of anticoagulant   CHF exacerbation   Acute respiratory failure   Acute renal failure   Acute diastolic heart failure   Hematoma of groin   Pseudoaneurysm   Acute blood loss anemia   Hemorrhagic shock    Plan: I discussed her situation with Dr. Kerry Hough. I agree we should not attempt weaning today. She needs another unit of blood and some diaphoresis and then we can look at her situation tomorrow    LOS: 7 days   Noel Henandez L 04/21/2013, 9:41 AM

## 2013-04-21 NOTE — Progress Notes (Signed)
Subjective: Interval History: Patient presently alert and awake . She is signaling to me she wants to be extubated. Denies any pain.   Objective: Vital signs in last 24 hours: Temp:  [97.6 F (36.4 C)-99.2 F (37.3 C)] 99 F (37.2 C) (06/29 0400) Pulse Rate:  [67-146] 110 (06/29 0730) Resp:  [3-24] 20 (06/29 0730) SpO2:  [63 %-100 %] 100 % (06/29 0730) Arterial Line BP: (60-134)/(37-74) 86/45 mmHg (06/29 0615) FiO2 (%):  [44.6 %-99.6 %] 45.1 % (06/29 0730) Weight:  [102.2 kg (225 lb 5 oz)] 102.2 kg (225 lb 5 oz) (06/29 0500) Weight change: 3.6 kg (7 lb 15 oz)  Intake/Output from previous day: 06/28 0701 - 06/29 0700 In: 1925 [I.V.:1725; IV Piggyback:200] Out: 1000 [Urine:1000] Intake/Output this shift:    Generally patient is awake and alert. Does not seem to be in any apparent distress. Chest decreased breath sound bilaterally and anteriorly. She has also some inspiratory crackles bilaterally. Heart irregular rate and rhythm no murmur. Her heart rate is controlled Abdomen soft positive bowel sound Extremities she has 1+ lower extremity edema however she has significant upper extremity edema.   Lab Results:  Recent Labs  04/20/13 1722 04/21/13 0133  WBC 8.6 10.1  HGB 8.1* 7.5*  HCT 23.3* 21.9*  PLT 85* 92*   BMET:   Recent Labs  04/20/13 0531 04/21/13 0508  NA 140 142  K 4.0 3.0*  CL 93* 96  CO2 38* 38*  GLUCOSE 203* 126*  BUN 38* 40*  CREATININE 1.82* 1.89*  CALCIUM 7.4* 7.2*   No results found for this basename: PTH,  in the last 72 hours Iron Studies: No results found for this basename: IRON, TIBC, TRANSFERRIN, FERRITIN,  in the last 72 hours  Studies/Results: [place] Lower Ext Art Right Ltd  04/20/2013   *RADIOLOGY REPORT*  Clinical Data: Expanding the right groin hematoma after catheter removal  RIGHT LOWER EXTREMITY ARTERIAL DUPLEX EVAL  Comparison: None.  Findings:  Provided gray scale images demonstrate a large mixed echogenic collection within the  right groin measuring approximately 20 x 14.5 x 12.6 cm.  There is layering echogenic debris within the dependent portion of this structure.  Provided images demonstrate apparent communication between this structure and the adjacent femoral artery though evaluation is somewhat degraded secondary to the large size of this mixed echogenic collection.   A small amount of apparent to and fro flow was noted within the apparent communication between the structure in the adjacent femoral artery (image 9).  A small amount of arterial flow is noted within the caudal aspect of this collection (image 5).  IMPRESSION: Large, approximately 20 cm narrow neck pseudoaneurysm/hematoma within the right groin adjacent to the femoral artery.  Ordering physician Dr. Leticia Penna there was present at the time of the ultrasound and is aware of the above findings.   Original Report Authenticated By: Tacey Ruiz, MD   Dg Chest Port 1 View  04/20/2013   *RADIOLOGY REPORT*  Clinical Data: Respiratory failure  PORTABLE CHEST - 1 VIEW  Comparison: Yesterday  Findings: Endotracheal tube and left PICC are stable.  Diffuse edema is worse.  Cardiomegaly.  Left basilar opacity.  No pneumothorax.  IMPRESSION: Worsening edema.   Original Report Authenticated By: Jolaine Click, M.D.   Dg Chest Port 1 View  04/19/2013   *RADIOLOGY REPORT*  Clinical Data: Status post PICC placement  PORTABLE CHEST - 1 VIEW  Comparison: April 18, 2013.  Findings: Stable moderate cardiomegaly.  Endotracheal tube is in  grossly good position and unchanged.  Central pulmonary vascular congestion is again noted.  Bilateral basilar opacities are again noted and unchanged.  There has been interval placement of left- sided PICC line with distal tip in the expected position of the right atrium.  IMPRESSION: Stable cardiomegaly and central pulmonary vascular congestion. Persistent bilateral basilar opacities which are unchanged. Interval placement of left-sided PICC line with distal  tip in right atrium.  It is recommended to be withdrawn at least 2-3 cm.   Original Report Authenticated By: Lupita Raider.,  M.D.    I have reviewed the patient'Armstrong current medications.  Assessment/Plan: Problem #1 acute kidney injury superimposed on chronic her BUN and creatinine presently is increasing. Most likely secondary to ATN associated with hypotension. Patient is none oliguric.  Problem #2 CHF patient off lasix because of hypotension. CHF seems worsoning Problem #3 hypokalemia related most likely to her diuretics. Her potassium is has declined Problem #4 diabetes Problem #5 a trial fibrillation her heart rate is controlled Problem #6 respiratory failure possibly combination of CHF and also pneumonia. Patient is presently remains intubated. Chest x-ray showed worsening of her infiltrate. Problem #7 hyperlipidemia Problem #8 obesity Problem #9 hypertension presently patient blood pressure seems to be low normal. Possibly from bleeding. Problem #10 hypernatremia most likely secondary to lack of free water and have normal saline.  Plan:change lasix to a drip. kcl 10 meq over 1 hour iv for a total of 3 doses and then star on 1/2ns with 40 meq kcl at 75 cc /hr We'll check her basic metabolic panel in the morning.     LOS: 7 days   Amber Armstrong 04/21/2013,8:40 AM

## 2013-04-21 NOTE — Progress Notes (Signed)
[facility] PROGRESS NOTE  Amber Armstrong EAV:[id number] DOB: [birth year] DOA: 04/14/2013 PCP: Pershing Proud  Brief Narrative  This lady was admitted to the hospital with shortness of breath due to CHF exacerbation.  She was started on intravenous lasix but did not have any significant response.  On 6/25 patient was found to be unresponsive and had a significant respiratory acidosis with a pco2 of 125 and pH of 7.06.  She was transferred to the ICU and intubated.  She was followed by cardiology for CHF, pulmonology for ventilator management and nephrology for acute renal failure.  Patient was found to be hypotensive, so femoral central line was placed by general surgery for vasopressor support to aide with diuresis.  Patient began to experience good response to lasix and overall condition was starting to improve. Creatinine also began to improve with diuresis.  Patient began to experience thick secretions from ET tube, and chest xray raised concerns for possible developing pneumonia.  She was started empirically on vancomycin and levofloxacin. Patient subsequently had a picc line placed for continued central access.  Once Picc line was placed, femoral central line was discontinued.  Unfortunately, patient had significant bleeding from right groin and developed a large right groin hematoma with pseudoaneurysm.  This was also complicated by the fact that patient was on full dose lovenox for atrial fibrillation. This eventually required operative repair by Dr. Leticia Penna.  Patient was transfused multiple units of FFP and PRBC for bleeding and anemia.  At this time, it appears that her bleeding has resolved.  Blood pressure is better today and diuresis may be resumed today.   Assessment/Plan:  Acute right groin hematoma with pseudoaneurysm. Patient had significant bleeding from right groin when her femoral central line was discontinued yesterday. This was complicated by the fact that she was  anticoagulated with Lovenox. Patient received protamine as well as FFP to reverse her coagulopathy. She had significant bleeding requiring transfusion of PRBCs and FFPs. Ultimately, she required surgical repair by Dr. Leticia Penna to achieve hemostasis. Her hemoglobin had initially improved, but now has started to trend down.  This may also be a dilutional effect.  With her ongoing CHF/cardiac issues, will transfuse one more unit prbc today to keep hemoglobin >8. Lower extremities are warm and pedal pulses are palpable. We'll continue to follow. 1. Hemorrhagic shock. Due to bleeding from right groin site. Blood pressure appears to be improving today.  Will transfuse one more unit of prbc today. 2. Acute blood loss anemia. Due to bleeding from right groin site. Patient received 4 units of PRBCs and 2 units of FFP his overnight. Continue to follow serial hemoglobins. 3. Acute on chronic respiratory failure, multifactorial. Patient is currently on ventilator. Chest xray yesterday indicated worsening edema, pending from this morning.  Will try and start diuresis today since blood pressure is better now. 4. Acute right-sided congestive heart failure. She continues to have evidence of heart failure on chest x-ray as well as peripheral edema. Blood pressure is better today, so will try to diurese with lasix. 5. Atrial fibrillation. Patient is having episodes of intermittent tachycardia. Will need to be cautious with beta blockers since patient is prone to hypotension. Anticoagulation has been discontinued due to bleeding from right groin site. 6. Acute renal failure. Creatinine is trending up. Nephrology is following.  Considering starting the patient on lasix infusion.  Continue to monitor urine output 7. Type 2 diabetes. Blood sugars have been stable.   Code Status: Patient was made DO NOT  RESUSCITATE by Dr. Karilyn Cota.; Family Communication: discussed with family at the bedside Disposition Plan: to be  determined   Consultants:  [facility] cardiology  Nephrology, Dr. Kristian Covey  General surgery, Dr. Leticia Penna  Procedures:  Intubation on 6/25  Central line placement on 6/25, by Dr. Leticia Penna  Right groin exploration, suture repair of right femoral vein, suture repair artery, right radial arterial line placement 6/28 by Dr. Leticia Penna   Antibiotics: Vancomycin 6/25 Levofloxacin 6/25  HPI/Subjective: Patient on ventilator. She is awake. She answers questions. Appears to be calm. Denies any pain.  Objective: Filed Vitals:   04/21/13 0545 04/21/13 0600 04/21/13 0615 04/21/13 0730  BP:      Pulse: 112 100 101 110  Temp:      TempSrc:      Resp: 19 16 16 20   Height:      Weight:      SpO2: 100% 99% 100% 100%    Intake/Output Summary (Last 24 hours) at 04/21/13 0854 Last data filed at 04/21/13 0600  Gross per 24 hour  Intake   1850 ml  Output   1000 ml  Net    850 ml   Filed Weights   04/19/13 0500 04/20/13 0600 04/21/13 0500  Weight: 100.4 kg (221 lb 5.5 oz) 98.6 kg (217 lb 6 oz) 102.2 kg (225 lb 5 oz)    Exam:   General:  NAD  Cardiovascular: s1, s2, tachycardic, irregular  Respiratory: rhonchi bilaterally  Abdomen: soft, nt, nd, bs+  Musculoskeletal: 1+ edema b/l in lower extremities, right groin with dressing in place which was not removed. There is swelling in right groin that is tender to touch. Pedal pulses are palpable bilaterally and legs are warm.  Upper extremities have 2+ edema b/l.  There is bruising/ edema around PICC line site.  Data Reviewed: Basic Metabolic Panel:  Recent Labs Lab 04/17/13 0447 04/18/13 0430 04/19/13 0416 04/19/13 2325 04/20/13 0531 04/21/13 0508  NA 141 147* 143 139 140 142  K 5.3* 3.1* 3.4* 3.5 4.0 3.0*  CL 100 101 94* 91* 93* 96  CO2 36* 42* 40* 38* 38* 38*  GLUCOSE 122* 97 124* 200* 203* 126*  BUN 47* 44* 39* 35* 38* 40*  CREATININE 2.09* 1.76* 1.77* 1.64* 1.82* 1.89*  CALCIUM 9.6 8.8 8.4 7.7* 7.4* 7.2*  PHOS  --   1.6* 2.8  --   --   --    Liver Function Tests:  Recent Labs Lab 04/17/13 0447  AST 25  ALT 11  ALKPHOS 131*  BILITOT 0.7  PROT 7.8  ALBUMIN 3.4*   No results found for this basename: LIPASE, AMYLASE,  in the last 168 hours No results found for this basename: AMMONIA,  in the last 168 hours CBC:  Recent Labs Lab 04/14/13 1030 04/17/13 0447  04/19/13 2325 04/20/13 0157 04/20/13 0531 04/20/13 0945 04/20/13 1722 04/21/13 0133  WBC 7.9 8.9  < > 9.3  --  9.9 9.3 8.6 10.1  NEUTROABS 5.1 6.0  --   --   --   --   --   --   --   HGB 13.1 12.9  < > 6.7* 8.5* 9.8* 9.4* 8.1* 7.5*  HCT 44.1 46.7*  < > 21.5* 25.8* 27.9* 27.3* 23.3* 21.9*  MCV 84.3 89.6  < > 80.2  --  82.8 82.0 82.0 83.9  PLT 293 317  < > 146*  --  101* 105* 85* 92*  < > = values in this interval not displayed. Cardiac Enzymes:  Recent Labs Lab 04/14/13 1030 04/14/13 1847 04/14/13 2353 04/15/13 0458  TROPONINI <0.30 <0.30 <0.30 <0.30   BNP (last 3 results)  Recent Labs  04/14/13 1030  PROBNP 12159.0*   CBG:  Recent Labs Lab 04/20/13 1604 04/20/13 1949 04/20/13 2357 04/21/13 0350 04/21/13 0744  GLUCAP 99 102* 118* 119* 135*    Recent Results (from the past 240 hour(s))  MRSA PCR SCREENING     Status: None   Collection Time    04/17/13  6:18 AM      Result Value Range Status   MRSA by PCR NEGATIVE  NEGATIVE Final   Comment:            The GeneXpert MRSA Assay (FDA     approved for NASAL specimens     only), is one component of a     comprehensive MRSA colonization     surveillance program. It is not     intended to diagnose MRSA     infection nor to guide or     monitor treatment for     MRSA infections.  URINE CULTURE     Status: None   Collection Time    04/17/13 11:40 AM      Result Value Range Status   Specimen Description URINE, CLEAN CATCH   Final   Special Requests NONE   Final   Culture  Setup Time 04/17/2013 12:20   Final   Colony Count NO GROWTH   Final   Culture NO  GROWTH   Final   Report Status 04/18/2013 FINAL   Final     Studies: [place] Lower Ext Art Right Ltd  04/20/2013   *RADIOLOGY REPORT*  Clinical Data: Expanding the right groin hematoma after catheter removal  RIGHT LOWER EXTREMITY ARTERIAL DUPLEX EVAL  Comparison: None.  Findings:  Provided gray scale images demonstrate a large mixed echogenic collection within the right groin measuring approximately 20 x 14.5 x 12.6 cm.  There is layering echogenic debris within the dependent portion of this structure.  Provided images demonstrate apparent communication between this structure and the adjacent femoral artery though evaluation is somewhat degraded secondary to the large size of this mixed echogenic collection.   A small amount of apparent to and fro flow was noted within the apparent communication between the structure in the adjacent femoral artery (image 9).  A small amount of arterial flow is noted within the caudal aspect of this collection (image 5).  IMPRESSION: Large, approximately 20 cm narrow neck pseudoaneurysm/hematoma within the right groin adjacent to the femoral artery.  Ordering physician Dr. Leticia Penna there was present at the time of the ultrasound and is aware of the above findings.   Original Report Authenticated By: Tacey Ruiz, MD   Dg Chest Port 1 View  04/20/2013   *RADIOLOGY REPORT*  Clinical Data: Respiratory failure  PORTABLE CHEST - 1 VIEW  Comparison: Yesterday  Findings: Endotracheal tube and left PICC are stable.  Diffuse edema is worse.  Cardiomegaly.  Left basilar opacity.  No pneumothorax.  IMPRESSION: Worsening edema.   Original Report Authenticated By: Jolaine Click, M.D.   Dg Chest Port 1 View  04/19/2013   *RADIOLOGY REPORT*  Clinical Data: Status post PICC placement  PORTABLE CHEST - 1 VIEW  Comparison: April 18, 2013.  Findings: Stable moderate cardiomegaly.  Endotracheal tube is in grossly good position and unchanged.  Central pulmonary vascular congestion is again noted.   Bilateral basilar opacities are again noted and unchanged.  There  has been interval placement of left- sided PICC line with distal tip in the expected position of the right atrium.  IMPRESSION: Stable cardiomegaly and central pulmonary vascular congestion. Persistent bilateral basilar opacities which are unchanged. Interval placement of left-sided PICC line with distal tip in right atrium.  It is recommended to be withdrawn at least 2-3 cm.   Original Report Authenticated By: Lupita Raider.,  M.D.    Scheduled Meds: . antiseptic oral rinse  15 mL Mouth Rinse QID  . bimatoprost  1 drop Both Eyes QHS  . chlorhexidine  15 mL Mouth Rinse BID  . insulin aspart  2-6 Units Subcutaneous Q4H  . levofloxacin (LEVAQUIN) IV  750 mg Intravenous Q48H  . Levothyroxine Sodium  100 mcg Intravenous Q24H  . nystatin   Topical BID  . pantoprazole (PROTONIX) IV  40 mg Intravenous Daily  . potassium chloride  10 mEq Intravenous Q1 Hr x 4  . sodium chloride  10-40 mL Intracatheter Q12H  . vancomycin  1,000 mg Intravenous Q24H   Continuous Infusions: . DOPamine Stopped (04/20/13 1005)  . furosemide (LASIX) infusion    . sodium chloride 0.45 % with kcl      Active Problems:   DIABETES MELLITUS, TYPE II   HYPERLIPIDEMIA   Morbid obesity with BMI of 40.0-44.9, adult   HYPERTENSION   FIBRILLATION, ATRIAL   Long term current use of anticoagulant   CHF exacerbation   Acute respiratory failure   Acute renal failure   Acute diastolic heart failure   Hematoma of groin   Pseudoaneurysm   Acute blood loss anemia   Hemorrhagic shock    Time spent: 35 mins    Analie Katzman  [facility] Pager [phone]. If 7PM-7AM, please contact night-coverage at [email or web address], password [facility] 04/21/2013, 8:54 AM  LOS: 7 days

## 2013-04-21 NOTE — Progress Notes (Signed)
Decreasing rate to 16 , adjusting slowly for high PH

## 2013-04-21 NOTE — Progress Notes (Signed)
1 unit prbc started. Pharmacy notified that kcl runs will be held until blood infusion is completed. Unable to start a second iv.

## 2013-04-21 NOTE — Progress Notes (Signed)
BLOOD TRANSFUSION COMPLETED. NO SX OF ANY ADVERSE REACTION.

## 2013-04-21 NOTE — Progress Notes (Signed)
1 Day Post-Op  Subjective: Remains intubated. Appropriately responsive. Nausea shakes head appropriately to questioning. Denies any pain. No right foot pain. Indicates she wants the endotracheal tube out .  Objective: Vital signs in last 24 hours: Temp:  [97.7 F (36.5 Armstrong)-99.2 F (37.3 Armstrong)] 97.9 F (36.6 Armstrong) (06/29 1045) Pulse Rate:  [70-123] 100 (06/29 1214) Resp:  [3-24] 19 (06/29 1214) SpO2:  [83 %-100 %] 100 % (06/29 1214) Arterial Line BP: (68-118)/(37-65) 97/50 mmHg (06/29 1100) FiO2 (%):  [44.6 %-99.6 %] 44.9 % (06/29 1214) Weight:  [102.2 kg (225 lb 5 oz)] 102.2 kg (225 lb 5 oz) (06/29 0500) Last BM Date: 04/14/13  Intake/Output from previous day: 06/28 0701 - 06/29 0700 In: 2000 [I.V.:1800; IV Piggyback:200] Out: 1000 [Urine:1000] Intake/Output this shift: Total I/O In: 400 [I.V.:150; IV Piggyback:250] Out: -   General appearance: alert and no distress GI: soft, non-tender; bowel sounds normal; no masses,  no organomegaly Extremities: Expected discomfort in the right groin. still with ecchymosis but no evidence of enlargement or recurrence of the hematoma. Right foot is warm. Weak to 1+ palpable PT/DP on right  Lab Results:   Recent Labs  04/21/13 0133 04/21/13 0900  WBC 10.1 12.4*  HGB 7.5* 7.6*  HCT 21.9* 22.1*  PLT 92* 94*   BMET  Recent Labs  04/20/13 0531 04/21/13 0508  NA 140 142  K 4.0 3.0*  CL 93* 96  CO2 38* 38*  GLUCOSE 203* 126*  BUN 38* 40*  CREATININE 1.82* 1.89*  CALCIUM 7.4* 7.2*   PT/INR  Recent Labs  04/20/13 0531 04/21/13 0508  LABPROT 17.8* 15.4*  INR 1.51* 1.25   ABG  Recent Labs  04/20/13 1020 04/21/13 0430  PHART 7.590* 7.549*  HCO3 36.6* 35.7*    Studies/Results: [place] Lower Ext Art Right Ltd  04/20/2013   *RADIOLOGY REPORT*  Clinical Data: Expanding the right groin hematoma after catheter removal  RIGHT LOWER EXTREMITY ARTERIAL DUPLEX EVAL  Comparison: None.  Findings:  Provided gray scale images demonstrate a  large mixed echogenic collection within the right groin measuring approximately 20 x 14.5 x 12.6 cm.  There is layering echogenic debris within the dependent portion of this structure.  Provided images demonstrate apparent communication between this structure and the adjacent femoral artery though evaluation is somewhat degraded secondary to the large size of this mixed echogenic collection.   A small amount of apparent to and fro flow was noted within the apparent communication between the structure in the adjacent femoral artery (image 9).  A small amount of arterial flow is noted within the caudal aspect of this collection (image 5).  IMPRESSION: Large, approximately 20 cm narrow neck pseudoaneurysm/hematoma within the right groin adjacent to the femoral artery.  Ordering physician Dr. Leticia Penna there was present at the time of the ultrasound and is aware of the above findings.   Original Report Authenticated By: Tacey Ruiz, MD   Dg Chest Port 1 View  04/21/2013   *RADIOLOGY REPORT*  Clinical Data:  Respiratory failure.  PORTABLE CHEST - 1 VIEW  Comparison: 04/20/2013.  None.  Findings: The endotracheal tube and left PICC line are unchanged in position.  Heart size remains enlarged.  Edema throughout the right lung is stable. There is moderate worsening of edema throughout the left lung.  There are no other changes.  There are no effusions or pneumothoraces.  There are no acute bony changes.  IMPRESSION: Worsening edema throughout the left lung compared to the preceding study.  Original Report Authenticated By: Sander Radon, M.D.   Dg Chest Port 1 View  04/20/2013   *RADIOLOGY REPORT*  Clinical Data: Respiratory failure  PORTABLE CHEST - 1 VIEW  Comparison: Yesterday  Findings: Endotracheal tube and left PICC are stable.  Diffuse edema is worse.  Cardiomegaly.  Left basilar opacity.  No pneumothorax.  IMPRESSION: Worsening edema.   Original Report Authenticated By: Jolaine Click, M.D.   Dg Chest Port 1  View  04/19/2013   *RADIOLOGY REPORT*  Clinical Data: Status post PICC placement  PORTABLE CHEST - 1 VIEW  Comparison: April 18, 2013.  Findings: Stable moderate cardiomegaly.  Endotracheal tube is in grossly good position and unchanged.  Central pulmonary vascular congestion is again noted.  Bilateral basilar opacities are again noted and unchanged.  There has been interval placement of left- sided PICC line with distal tip in the expected position of the right atrium.  IMPRESSION: Stable cardiomegaly and central pulmonary vascular congestion. Persistent bilateral basilar opacities which are unchanged. Interval placement of left-sided PICC line with distal tip in right atrium.  It is recommended to be withdrawn at least 2-3 cm.   Original Report Authenticated By: Lupita Raider.,  M.D.    Anti-infectives: Anti-infectives   Start     Dose/Rate Route Frequency Ordered Stop   04/21/13 0600  vancomycin (VANCOCIN) IVPB 1000 mg/200 mL premix     1,000 mg 200 mL/hr over 60 Minutes Intravenous Every 24 hours 04/20/13 1012     04/17/13 1000  vancomycin (VANCOCIN) 1,500 mg in sodium chloride 0.9 % 500 mL IVPB  Status:  Discontinued     1,500 mg 250 mL/hr over 120 Minutes Intravenous Every 24 hours 04/17/13 0807 04/20/13 1012   04/17/13 0900  levofloxacin (LEVAQUIN) IVPB 750 mg     750 mg 100 mL/hr over 90 Minutes Intravenous Every 48 hours 04/17/13 0807     04/17/13 0800  levofloxacin (LEVAQUIN) IVPB 500 mg  Status:  Discontinued     500 mg 100 mL/hr over 60 Minutes Intravenous Every 24 hours 04/17/13 0747 04/17/13 0807      Assessment/Plan: s/p Procedure(s): GROIN EXPLORATION (Right) Appears stable from a vascular standpoint. Hemoglobin has dropped slightly but no evidence of active rebleeding the groin. Patient is currently receiving transfusion. Reassured patient regarding extubation however at this time given her course it is unlikely she will be ready for extubation any time soon. Dr. Juanetta Gosling is  obviously managing this issue. Continue to monitor distal pulses  LOS: 7 days    Amber Armstrong 04/21/2013

## 2013-04-22 ENCOUNTER — Inpatient Hospital Stay (HOSPITAL_COMMUNITY): Payer: BC Managed Care – PPO

## 2013-04-22 ENCOUNTER — Encounter (HOSPITAL_COMMUNITY): Payer: Self-pay | Admitting: General Surgery

## 2013-04-22 LAB — CBC
HCT: 25.6 % — ABNORMAL LOW (ref 36.0–46.0)
HCT: 26.3 % — ABNORMAL LOW (ref 36.0–46.0)
HCT: 26 % — ABNORMAL LOW (ref 36.0–46.0)
HCT: 26.8 % — ABNORMAL LOW (ref 36.0–46.0)
Hemoglobin: 8.6 g/dL — ABNORMAL LOW (ref 12.0–15.0)
MCV: 88.3 fL (ref 78.0–100.0)
MCV: 89 fL (ref 78.0–100.0)
Platelets: 98 10*3/uL — ABNORMAL LOW (ref 150–400)
RBC: 2.9 MIL/uL — ABNORMAL LOW (ref 3.87–5.11)
RBC: 2.96 MIL/uL — ABNORMAL LOW (ref 3.87–5.11)
RBC: 2.92 MIL/uL — ABNORMAL LOW (ref 3.87–5.11)
RDW: 17.1 % — ABNORMAL HIGH (ref 11.5–15.5)
RDW: 17.3 % — ABNORMAL HIGH (ref 11.5–15.5)
RDW: 17.5 % — ABNORMAL HIGH (ref 11.5–15.5)
WBC: 12 10*3/uL — ABNORMAL HIGH (ref 4.0–10.5)
WBC: 12.4 10*3/uL — ABNORMAL HIGH (ref 4.0–10.5)
WBC: 14.7 10*3/uL — ABNORMAL HIGH (ref 4.0–10.5)
WBC: 15.6 10*3/uL — ABNORMAL HIGH (ref 4.0–10.5)

## 2013-04-22 LAB — BASIC METABOLIC PANEL
CO2: 39 mEq/L — ABNORMAL HIGH (ref 19–32)
Chloride: 95 mEq/L — ABNORMAL LOW (ref 96–112)
Creatinine, Ser: 1.74 mg/dL — ABNORMAL HIGH (ref 0.50–1.10)
GFR calc Af Amer: 35 mL/min — ABNORMAL LOW (ref >90)
Potassium: 3 mEq/L — ABNORMAL LOW (ref 3.5–5.1)
Sodium: 142 mEq/L (ref 135–145)

## 2013-04-22 LAB — BLOOD GAS, ARTERIAL
Bicarbonate: 37.4 mEq/L — ABNORMAL HIGH (ref 20.0–24.0)
MECHVT: 370 mL
Mode: POSITIVE
O2 Saturation: 97.8 %
PEEP: 5 cmH2O
Patient temperature: 37
Pressure support: 5 cmH2O
TCO2: 34.8 mmol/L (ref 0–100)
pH, Arterial: 7.405 (ref 7.350–7.450)

## 2013-04-22 LAB — GLUCOSE, CAPILLARY
Glucose-Capillary: 108 mg/dL — ABNORMAL HIGH (ref 70–99)
Glucose-Capillary: 115 mg/dL — ABNORMAL HIGH (ref 70–99)
Glucose-Capillary: 108 mg/dL — ABNORMAL HIGH (ref 70–99)
Glucose-Capillary: 106 mg/dL — ABNORMAL HIGH (ref 70–99)

## 2013-04-22 LAB — PROTIME-INR
INR: 1.2 (ref 0.00–1.49)
Prothrombin Time: 14.9 seconds (ref 11.6–15.2)

## 2013-04-22 MED ORDER — SODIUM CHLORIDE 0.45 % IV SOLN
INTRAVENOUS | Status: AC
  Administered 2013-04-23: 05:00:00 via INTRAVENOUS
  Filled 2013-04-22 (×2): qty 1000

## 2013-04-22 MED ORDER — POTASSIUM CHLORIDE 10 MEQ/100ML IV SOLN
10.0000 meq | INTRAVENOUS | Status: AC
  Administered 2013-04-22 (×2): 10 meq via INTRAVENOUS
  Filled 2013-04-22: qty 400

## 2013-04-22 MED ORDER — INSULIN ASPART 100 UNIT/ML ~~LOC~~ SOLN
2.0000 [IU] | SUBCUTANEOUS | Status: DC
  Administered 2013-04-23 (×2): 4 [IU] via SUBCUTANEOUS
  Administered 2013-04-23: 6 [IU] via SUBCUTANEOUS
  Administered 2013-04-23: 4 [IU] via SUBCUTANEOUS

## 2013-04-22 MED ORDER — TRACE MINERALS CR-CU-F-FE-I-MN-MO-SE-ZN IV SOLN
INTRAVENOUS | Status: AC
  Administered 2013-04-22: 17:00:00 via INTRAVENOUS
  Filled 2013-04-22: qty 2000

## 2013-04-22 MED ORDER — FUROSEMIDE 10 MG/ML IJ SOLN
60.0000 mg | Freq: Two times a day (BID) | INTRAMUSCULAR | Status: DC
  Administered 2013-04-22 – 2013-04-24 (×5): 60 mg via INTRAVENOUS
  Filled 2013-04-22 (×5): qty 6

## 2013-04-22 MED ORDER — FAT EMULSION 20 % IV EMUL
250.0000 mL | INTRAVENOUS | Status: AC
  Administered 2013-04-22: 250 mL via INTRAVENOUS
  Filled 2013-04-22: qty 250

## 2013-04-22 NOTE — Progress Notes (Signed)
MD paged and notified of CVP of 26.

## 2013-04-22 NOTE — Progress Notes (Signed)
Results for Amber Armstrong, Amber Armstrong (MRN [id number]) as of 04/22/2013 05:34  Ref. Range 04/22/2013 05:21  VT No range found 370  Peep/cpap No range found 5.0  pH, Arterial Latest Range: 7.350-7.450  7.474 (H)  pCO2 arterial Latest Range: 35.0-45.0 mmHg 51.5 (H)  pO2, Arterial Latest Range: 80.0-100.0 mmHg 91.2  Bicarbonate Latest Range: 20.0-24.0 mEq/L 37.4 (H)  TCO2 Latest Range: 0-100 mmol/L 34.8  Acid-Base Excess Latest Range: 0.0-2.0 mmol/L 12.9 (H)  O2 Saturation No range found 97.8  Patient temperature No range found 37.0  Collection site No range found A-LINE  Allens test (pass/fail) Latest Range: PASS  NOT INDICATED (A)    Rate is now 14 ,

## 2013-04-22 NOTE — Progress Notes (Signed)
Subjective: Patient intubated.  Comfortable. Objective: Filed Vitals:   04/22/13 0615 04/22/13 0630 04/22/13 0645 04/22/13 0811  BP:      Pulse: 106 101 92   Temp:      TempSrc:      Resp: 17 22 14    Height:    5' (1.524 m)  Weight:      SpO2: 100% 100% 100%    Weight change: -9 lb 4.2 oz (-4.2 kg)  Intake/Output Summary (Last 24 hours) at 04/22/13 0931 Last data filed at 04/22/13 0600  Gross per 24 hour  Intake 1917.93 ml  Output   4150 ml  Net -2232.07 ml    General:Patient is awake, comfortable.  Intubated Neck:  JVP is difficult to assess Heart: Regular rate and rhythm, without murmurs, rubs, gallops.  Lungs: Rel clear to auscultation anteriorly.   Exemities:  ++ edema.   Neuro: Grossly intact, nonfocal.  Tele Afib 90s Lab Results: Results for orders placed during the hospital encounter of 04/14/13 (from the past 24 hour(s))  GLUCOSE, CAPILLARY     Status: Abnormal   Collection Time    04/21/13 11:21 AM      Result Value Range   Glucose-Capillary 135 (*) 70 - 99 mg/dL  GLUCOSE, CAPILLARY     Status: Abnormal   Collection Time    04/21/13  4:40 PM      Result Value Range   Glucose-Capillary 102 (*) 70 - 99 mg/dL  CBC     Status: Abnormal   Collection Time    04/21/13  4:56 PM      Result Value Range   WBC 13.5 (*) 4.0 - 10.5 K/uL   RBC 2.99 (*) 3.87 - 5.11 MIL/uL   Hemoglobin 8.9 (*) 12.0 - 15.0 g/dL   HCT 14.7 (*) 82.9 - 56.2 %   MCV 87.0  78.0 - 100.0 fL   MCH 29.8  26.0 - 34.0 pg   MCHC 34.2  30.0 - 36.0 g/dL   RDW 13.0 (*) 86.5 - 78.4 %   Platelets 96 (*) 150 - 400 K/uL  GLUCOSE, CAPILLARY     Status: Abnormal   Collection Time    04/21/13  8:11 PM      Result Value Range   Glucose-Capillary 103 (*) 70 - 99 mg/dL   Comment 1 Notify RN    GLUCOSE, CAPILLARY     Status: Abnormal   Collection Time    04/21/13 11:39 PM      Result Value Range   Glucose-Capillary 107 (*) 70 - 99 mg/dL   Comment 1 Notify RN    CBC     Status: Abnormal   Collection Time    04/22/13  1:12 AM      Result Value Range   WBC 12.0 (*) 4.0 - 10.5 K/uL   RBC 2.90 (*) 3.87 - 5.11 MIL/uL   Hemoglobin 8.6 (*) 12.0 - 15.0 g/dL   HCT 69.6 (*) 29.5 - 28.4 %   MCV 88.3  78.0 - 100.0 fL   MCH 29.7  26.0 - 34.0 pg   MCHC 33.6  30.0 - 36.0 g/dL   RDW 13.2 (*) 44.0 - 10.2 %   Platelets 94 (*) 150 - 400 K/uL  GLUCOSE, CAPILLARY     Status: Abnormal   Collection Time    04/22/13  4:32 AM      Result Value Range   Glucose-Capillary 108 (*) 70 - 99 mg/dL   Comment 1 Notify RN  PROTIME-INR     Status: None   Collection Time    04/22/13  4:44 AM      Result Value Range   Prothrombin Time 14.9  11.6 - 15.2 seconds   INR 1.20  0.00 - 1.49  BASIC METABOLIC PANEL     Status: Abnormal   Collection Time    04/22/13  4:44 AM      Result Value Range   Sodium 142  135 - 145 mEq/L   Potassium 3.0 (*) 3.5 - 5.1 mEq/L   Chloride 95 (*) 96 - 112 mEq/L   CO2 39 (*) 19 - 32 mEq/L   Glucose, Bld 116 (*) 70 - 99 mg/dL   BUN 33 (*) 6 - 23 mg/dL   Creatinine, Ser 4.09 (*) 0.50 - 1.10 mg/dL   Calcium 8.1 (*) 8.4 - 10.5 mg/dL   GFR calc non Af Amer 30 (*) >90 mL/min   GFR calc Af Amer 35 (*) >90 mL/min  CBC     Status: Abnormal   Collection Time    04/22/13  4:44 AM      Result Value Range   WBC 12.4 (*) 4.0 - 10.5 K/uL   RBC 2.96 (*) 3.87 - 5.11 MIL/uL   Hemoglobin 8.8 (*) 12.0 - 15.0 g/dL   HCT 81.1 (*) 91.4 - 78.2 %   MCV 88.9  78.0 - 100.0 fL   MCH 29.7  26.0 - 34.0 pg   MCHC 33.5  30.0 - 36.0 g/dL   RDW 95.6 (*) 21.3 - 08.6 %   Platelets 90 (*) 150 - 400 K/uL  BLOOD GAS, ARTERIAL     Status: Abnormal   Collection Time    04/22/13  5:21 AM      Result Value Range   FIO2 0.45     Delivery systems VENTILATOR     Mode PRESSURE REGULATED VOLUME CONTROL     VT 370     Rate 14     Peep/cpap 5.0     pH, Arterial 7.474 (*) 7.350 - 7.450   pCO2 arterial 51.5 (*) 35.0 - 45.0 mmHg   pO2, Arterial 91.2  80.0 - 100.0 mmHg   Bicarbonate 37.4 (*) 20.0 -  24.0 mEq/L   TCO2 34.8  0 - 100 mmol/L   Acid-Base Excess 12.9 (*) 0.0 - 2.0 mmol/L   O2 Saturation 97.8     Patient temperature 37.0     Collection site A-LINE     Drawn by ARTERIAL     Sample type COLLECTED BY RT     Allens test (pass/fail) NOT INDICATED (*) PASS  GLUCOSE, CAPILLARY     Status: Abnormal   Collection Time    04/22/13  7:15 AM      Result Value Range   Glucose-Capillary 115 (*) 70 - 99 mg/dL   Comment 1 Documented in Chart     Comment 2 Notify RN    BLOOD GAS, ARTERIAL     Status: Abnormal   Collection Time    04/22/13  9:10 AM      Result Value Range   FIO2 40.00     Delivery systems VENTILATOR     Mode CONTINUOUS POSITIVE AIRWAY PRESSURE     Peep/cpap 5.0     Pressure support 5     pH, Arterial 7.405  7.350 - 7.450   pCO2 arterial 62.3 (*) 35.0 - 45.0 mmHg   pO2, Arterial 83.1  80.0 - 100.0 mmHg   Bicarbonate 38.3 (*)  20.0 - 24.0 mEq/L   TCO2 34.1  0 - 100 mmol/L   Acid-Base Excess 12.9 (*) 0.0 - 2.0 mmol/L   O2 Saturation 96.2     Collection site A-LINE     Drawn by COLLECTED BY RT     Sample type ARTERIAL      Studies/Results: @RISRSLT24 @  Medications:  Reviewed   @PROBHOSP @  1.  CHF Right sided and left sided diastolic CHF.   Continues to diurese. Exam still with edema.  Would continue diuresis before extubating.    2.  Afib  Rates are fairly well controlled  Continue lovenox.  3.  Respiratory failure.  Remains intubated.  Needs more fluid removal prior to extubation.  4.  Renal  Cr slightly improved.  COntinue diuresis.  Follow.    LOS: 8 days   Dietrich Pates 04/22/2013, 9:31 AM

## 2013-04-22 NOTE — Progress Notes (Signed)
[facility] PROGRESS NOTE  Amber Armstrong ZOX:[id number] DOB: [birth year] DOA: 04/14/2013 PCP: Pershing Proud  Brief Narrative  This lady was admitted to the hospital with shortness of breath due to CHF exacerbation.  She was started on intravenous lasix but did not have any significant response.  On 6/25 patient was found to be unresponsive and had a significant respiratory acidosis with a pco2 of 125 and pH of 7.06.  She was transferred to the ICU and intubated.  She was followed by cardiology for CHF, pulmonology for ventilator management and nephrology for acute renal failure.  Patient was found to be hypotensive, so femoral central line was placed by general surgery for vasopressor support to aide with diuresis.  Patient began to experience good response to lasix and overall condition was starting to improve. Creatinine also began to improve with diuresis.  Patient began to experience thick secretions from ET tube, and chest xray raised concerns for possible developing pneumonia.  She was started empirically on vancomycin and levofloxacin. Patient subsequently had a picc line placed for continued central access.  Once Picc line was placed, femoral central line was discontinued.  Unfortunately, patient had significant bleeding from right groin and developed a large right groin hematoma with pseudoaneurysm.  This was also complicated by the fact that patient was on full dose lovenox for atrial fibrillation. This eventually required operative repair by Dr. Leticia Penna.  Patient was transfused multiple units of FFP and PRBC for bleeding and anemia.  At this time, it appears that her bleeding has resolved.    Assessment/Plan:   1. Hemorrhagic shock. Due to bleeding from right groin site. Blood pressure still slightly soft. Status post 5 units packed red blood cells 2. Acute blood loss anemia. Due to bleeding from right groin site. Patient received 5 units of PRBCs and 2 units of FFP his  overnight. Hemoglobin unchanged 3. Acute on chronic respiratory failure, multifactorial. Patient is currently on ventilator. Chest xray yesterday indicated worsening edema, pending from this morning.  Will try and start diuresis today since blood pressure is better now. Likely patient will be on a ventilator for an extended period of time 4. Acute right-sided congestive heart failure. She continues to have evidence of heart failure on chest x-ray as well as peripheral edema. Diuresed 1.5 L in the last 24 hours. Continue diuresis and appreciate cardiology help 5. Atrial fibrillation. Patient is having episodes of intermittent tachycardia. Will need to be cautious with beta blockers since patient is prone to hypotension. Anticoagulation has been discontinued due to bleeding from right groin site. 6. Acute renal failure. Creatinine about the same. Nephrology is following.  Considering starting the patient on lasix infusion.  Continue to monitor urine output 7. Type 2 diabetes. Blood sugars have been stable. 8. Hypokalemia: Replace 9. Acute right groin hematoma with pseudoaneurysm. Patient had significant bleeding from right groin when her femoral central line was discontinued yesterday, complicated by the fact that she was anticoagulated with Lovenox. Patient received protamine as well as FFP to reverse her coagulopathy. She had significant bleeding requiring transfusion of PRBCs and FFPs. Ultimately, she required surgical repair by Dr. Leticia Penna to achieve hemostasis.  Stable today. 10. Nutrition: Likely patient on ventilator for some time. Will have PICC line exchanged for increased ports. Pharmacy to start TPN.   Code Status: Patient was made DO NOT RESUSCITATE by Dr. Karilyn Cota.; Family Communication: Will call family to discuss today. Disposition Plan: Given expectation that patient may be a ventilator for some time,  will discuss possibility of LTAC   Consultants:  [facility] cardiology  Nephrology,  Dr. Kristian Covey  General surgery, Dr. Leticia Penna  Pulmonary, Dr. Juanetta Gosling  Procedures:  Intubation on 6/25  Central line placement on 6/25, by Dr. Leticia Penna  Right groin exploration, suture repair of right femoral vein, suture repair artery, right radial arterial line placement 6/28 by Dr. Leticia Penna   Antibiotics: Vancomycin 6/25 Levofloxacin 6/25  HPI/Subjective: Patient intubated, but awake. No acute distress. Seems to have some mild choking sensation with tube, but otherwise no complaints  Objective: Filed Vitals:   04/22/13 0800 04/22/13 0811 04/22/13 0900 04/22/13 1000  BP:      Pulse: 88  93 97  Temp: 97.7 F (36.5 C)     TempSrc: Oral     Resp: 14  18 18   Height:  5' (1.524 m)    Weight:      SpO2: 100%  98% 98%    Intake/Output Summary (Last 24 hours) at 04/22/13 1103 Last data filed at 04/22/13 1023  Gross per 24 hour  Intake 2154.18 ml  Output   4150 ml  Net -1995.82 ml   Filed Weights   04/20/13 0600 04/21/13 0500 04/22/13 0500  Weight: 98.6 kg (217 lb 6 oz) 102.2 kg (225 lb 5 oz) 98 kg (216 lb 0.8 oz)    Exam:   General:  NAD, awake and no acute distress  Cardiovascular: s1, s2, mildly tachycardic, irregular  Respiratory: Scattered rhonchi  Abdomen: soft, nt, nd, bs+  Musculoskeletal: 1+ edema b/l in lower extremities, right groin with dressing in place which was not removed. There is swelling in right groin that is tender to touch. Pedal pulses are palpable bilaterally and legs are warm.  Upper extremities have 2+ edema b/l.  There is bruising/ edema around PICC line site.  Data Reviewed: Basic Metabolic Panel:  Recent Labs Lab 04/17/13 0447 04/18/13 0430 04/19/13 0416 04/19/13 2325 04/20/13 0531 04/21/13 0508 04/22/13 0444  NA 141 147* 143 139 140 142 142  K 5.3* 3.1* 3.4* 3.5 4.0 3.0* 3.0*  CL 100 101 94* 91* 93* 96 95*  CO2 36* 42* 40* 38* 38* 38* 39*  GLUCOSE 122* 97 124* 200* 203* 126* 116*  BUN 47* 44* 39* 35* 38* 40* 33*   CREATININE 2.09* 1.76* 1.77* 1.64* 1.82* 1.89* 1.74*  CALCIUM 9.6 8.8 8.4 7.7* 7.4* 7.2* 8.1*  PHOS  --  1.6* 2.8  --   --   --   --    Liver Function Tests:  Recent Labs Lab 04/17/13 0447  AST 25  ALT 11  ALKPHOS 131*  BILITOT 0.7  PROT 7.8  ALBUMIN 3.4*   CBC:  Recent Labs Lab 04/17/13 0447  04/21/13 0900 04/21/13 1656 04/22/13 0112 04/22/13 0444 04/22/13 0923  WBC 8.9  < > 12.4* 13.5* 12.0* 12.4* 14.7*  NEUTROABS 6.0  --   --   --   --   --   --   HGB 12.9  < > 7.6* 8.9* 8.6* 8.8* 8.6*  HCT 46.7*  < > 22.1* 26.0* 25.6* 26.3* 26.0*  MCV 89.6  < > 85.7 87.0 88.3 88.9 89.0  PLT 317  < > 94* 96* 94* 90* 91*  < > = values in this interval not displayed. BNP (last 3 results)  Recent Labs  04/14/13 1030  PROBNP 12159.0*   CBG:  Recent Labs Lab 04/21/13 1640 04/21/13 2011 04/21/13 2339 04/22/13 0432 04/22/13 0715  GLUCAP 102* 103* 107* 108* 115*  Recent Results (from the past 240 hour(s))  MRSA PCR SCREENING     Status: None   Collection Time    04/17/13  6:18 AM      Result Value Range Status   MRSA by PCR NEGATIVE  NEGATIVE Final   Comment:            The GeneXpert MRSA Assay (FDA     approved for NASAL specimens     only), is one component of a     comprehensive MRSA colonization     surveillance program. It is not     intended to diagnose MRSA     infection nor to guide or     monitor treatment for     MRSA infections.  URINE CULTURE     Status: None   Collection Time    04/17/13 11:40 AM      Result Value Range Status   Specimen Description URINE, CLEAN CATCH   Final   Special Requests NONE   Final   Culture  Setup Time 04/17/2013 12:20   Final   Colony Count NO GROWTH   Final   Culture NO GROWTH   Final   Report Status 04/18/2013 FINAL   Final     Studies: Dg Chest Port 1 View  04/22/2013  IMPRESSION:  1.  Stable support apparatus. 2.  Shifting bilateral airspace disease most compatible with pulmonary edema.  Superimposed basilar  atelectasis and shifting pleural effusions.   Original Report Authenticated By: Andreas Newport, M.D.   Dg Chest Port 1 View  04/21/2013    IMPRESSION: Worsening edema throughout the left lung compared to the preceding study.   Original Report Authenticated By: Sander Radon, M.D.    Scheduled Meds: . antiseptic oral rinse  15 mL Mouth Rinse QID  . bimatoprost  1 drop Both Eyes QHS  . chlorhexidine  15 mL Mouth Rinse BID  . furosemide  60 mg Intravenous BID  . insulin aspart  2-10 Units Subcutaneous Q4H  . levofloxacin (LEVAQUIN) IV  750 mg Intravenous Q48H  . Levothyroxine Sodium  100 mcg Intravenous Q24H  . nystatin   Topical BID  . pantoprazole (PROTONIX) IV  40 mg Intravenous Daily  . sodium chloride  10-40 mL Intracatheter Q12H  . vancomycin  1,000 mg Intravenous Q24H   Continuous Infusions: . DOPamine Stopped (04/20/13 1005)  . Marland KitchenTPN (CLINIMIX-E) Adult     And  . fat emulsion    . sodium chloride 0.45 % 1,000 mL with potassium chloride 40 mEq infusion 100 mL/hr at 04/22/13 0745  . sodium chloride 0.45 % 1,000 mL with potassium chloride 40 mEq infusion      Active Problems:   DIABETES MELLITUS, TYPE II   HYPERLIPIDEMIA   Morbid obesity with BMI of 40.0-44.9, adult   HYPERTENSION   FIBRILLATION, ATRIAL   Long term current use of anticoagulant   CHF exacerbation   Acute respiratory failure   Acute renal failure   Acute diastolic heart failure   Hematoma of groin   Pseudoaneurysm   Acute blood loss anemia   Hemorrhagic shock    Time spent: 35 mins    Hollice Espy  [facility] Pager [phone]. If 7PM-7AM, please contact night-coverage at [email or web address], password [facility] 04/22/2013, 11:03 AM  LOS: 8 days

## 2013-04-22 NOTE — Progress Notes (Signed)
Subjective: She is better. She's more alert. She motions that she wants to be extubated. She is somewhat hypokalemic. Her hemoglobin level is stable now. She has not had anymore bleeding noted  Objective: Vital signs in last 24 hours: Temp:  [97.3 F (36.3 C)-98.4 F (36.9 C)] 97.3 F (36.3 C) (06/30 0400) Pulse Rate:  [78-123] 92 (06/30 0645) Resp:  [13-26] 14 (06/30 0645) SpO2:  [99 %-100 %] 100 % (06/30 0645) Arterial Line BP: (69-157)/(46-149) 89/48 mmHg (06/30 0645) FiO2 (%):  [44.6 %-45.5 %] 44.8 % (06/30 0645) Weight:  [98 kg (216 lb 0.8 oz)] 98 kg (216 lb 0.8 oz) (06/30 0500) Weight change: -4.2 kg (-9 lb 4.2 oz) Last BM Date: 04/14/13  Intake/Output from previous day: 06/29 0701 - 06/30 0700 In: 2167.9 [I.V.:1517.9; IV Piggyback:650] Out: 4150 [Urine:4150]  PHYSICAL EXAM General appearance: alert, cooperative and Intubated Resp: rhonchi bilaterally Cardio: irregularly irregular rhythm GI: soft, non-tender; bowel sounds normal; no masses,  no organomegaly Extremities: She has edema in upper and lower extremities which is pretty much unchanged from yesterday  Lab Results:    Basic Metabolic Panel:  Recent Labs  16/10/96 0508 04/22/13 0444  NA 142 142  K 3.0* 3.0*  CL 96 95*  CO2 38* 39*  GLUCOSE 126* 116*  BUN 40* 33*  CREATININE 1.89* 1.74*  CALCIUM 7.2* 8.1*   Liver Function Tests: No results found for this basename: AST, ALT, ALKPHOS, BILITOT, PROT, ALBUMIN,  in the last 72 hours No results found for this basename: LIPASE, AMYLASE,  in the last 72 hours No results found for this basename: AMMONIA,  in the last 72 hours CBC:  Recent Labs  04/22/13 0112 04/22/13 0444  WBC 12.0* 12.4*  HGB 8.6* 8.8*  HCT 25.6* 26.3*  MCV 88.3 88.9  PLT 94* 90*   Cardiac Enzymes: No results found for this basename: CKTOTAL, CKMB, CKMBINDEX, TROPONINI,  in the last 72 hours BNP: No results found for this basename: PROBNP,  in the last 72 hours D-Dimer: No  results found for this basename: DDIMER,  in the last 72 hours CBG:  Recent Labs  04/21/13 1121 04/21/13 1640 04/21/13 2011 04/21/13 2339 04/22/13 0432 04/22/13 0715  GLUCAP 135* 102* 103* 107* 108* 115*   Hemoglobin A1C: No results found for this basename: HGBA1C,  in the last 72 hours Fasting Lipid Panel: No results found for this basename: CHOL, HDL, LDLCALC, TRIG, CHOLHDL, LDLDIRECT,  in the last 72 hours Thyroid Function Tests: No results found for this basename: TSH, T4TOTAL, FREET4, T3FREE, THYROIDAB,  in the last 72 hours Anemia Panel: No results found for this basename: VITAMINB12, FOLATE, FERRITIN, TIBC, IRON, RETICCTPCT,  in the last 72 hours Coagulation:  Recent Labs  04/21/13 0508 04/22/13 0444  LABPROT 15.4* 14.9  INR 1.25 1.20   Urine Drug Screen: Drugs of Abuse  No results found for this basename: labopia, cocainscrnur, labbenz, amphetmu, thcu, labbarb    Alcohol Level: No results found for this basename: ETH,  in the last 72 hours Urinalysis: No results found for this basename: COLORURINE, APPERANCEUR, LABSPEC, PHURINE, GLUCOSEU, HGBUR, BILIRUBINUR, KETONESUR, PROTEINUR, UROBILINOGEN, NITRITE, LEUKOCYTESUR,  in the last 72 hours Misc. Labs:  ABGS  Recent Labs  04/22/13 0521  PHART 7.474*  PO2ART 91.2  TCO2 34.8  HCO3 37.4*   CULTURES Recent Results (from the past 240 hour(s))  MRSA PCR SCREENING     Status: None   Collection Time    04/17/13  6:18 AM  Result Value Range Status   MRSA by PCR NEGATIVE  NEGATIVE Final   Comment:            The GeneXpert MRSA Assay (FDA     approved for NASAL specimens     only), is one component of a     comprehensive MRSA colonization     surveillance program. It is not     intended to diagnose MRSA     infection nor to guide or     monitor treatment for     MRSA infections.  URINE CULTURE     Status: None   Collection Time    04/17/13 11:40 AM      Result Value Range Status   Specimen  Description URINE, CLEAN CATCH   Final   Special Requests NONE   Final   Culture  Setup Time 04/17/2013 12:20   Final   Colony Count NO GROWTH   Final   Culture NO GROWTH   Final   Report Status 04/18/2013 FINAL   Final   Studies/Results: Dg Chest Port 1 View  04/22/2013   *RADIOLOGY REPORT*  Clinical Data: Ventilated patient.  Ventilator protocol.  Follow-up radiograph.  Assess support apparatus.  PORTABLE CHEST - 1 VIEW  Comparison: 04/21/2013, 0845 hours.  Findings: The cardiopericardial silhouette is partially obscured but appears enlarged.  Endotracheal tube tip 31 mm from carina. Left upper extremity PICC is present with the tip at the cavoatrial junction.  Low volume chest.  Shifting airspace disease is present compatible with pulmonary edema.  Underlying pneumonia cannot be excluded.  The layering of a pleural effusions.  IMPRESSION:  1.  Stable support apparatus. 2.  Shifting bilateral airspace disease most compatible with pulmonary edema.  Superimposed basilar atelectasis and shifting pleural effusions.   Original Report Authenticated By: Andreas Newport, M.D.   Dg Chest Port 1 View  04/21/2013   *RADIOLOGY REPORT*  Clinical Data:  Respiratory failure.  PORTABLE CHEST - 1 VIEW  Comparison: 04/20/2013.  None.  Findings: The endotracheal tube and left PICC line are unchanged in position.  Heart size remains enlarged.  Edema throughout the right lung is stable. There is moderate worsening of edema throughout the left lung.  There are no other changes.  There are no effusions or pneumothoraces.  There are no acute bony changes.  IMPRESSION: Worsening edema throughout the left lung compared to the preceding study.   Original Report Authenticated By: Sander Radon, M.D.    Medications:  Scheduled: . antiseptic oral rinse  15 mL Mouth Rinse QID  . bimatoprost  1 drop Both Eyes QHS  . chlorhexidine  15 mL Mouth Rinse BID  . furosemide  60 mg Intravenous BID  . insulin aspart  2-6 Units  Subcutaneous Q4H  . levofloxacin (LEVAQUIN) IV  750 mg Intravenous Q48H  . Levothyroxine Sodium  100 mcg Intravenous Q24H  . nystatin   Topical BID  . pantoprazole (PROTONIX) IV  40 mg Intravenous Daily  . sodium chloride  10-40 mL Intracatheter Q12H  . vancomycin  1,000 mg Intravenous Q24H   Continuous: . DOPamine Stopped (04/20/13 1005)  . sodium chloride 0.45 % 1,000 mL with potassium chloride 40 mEq infusion 75 mL/hr at 04/22/13 0600   ZOX:[id number], midazolam, morphine injection, ondansetron (ZOFRAN) IV, sodium chloride  Assesment: She has acute on chronic respiratory failure. She has what seems to be right heart failure. She has pulmonary hypertension. She has chronic atrial fibrillation. She had an episode of bleeding at  a central venous line site. She had acute blood loss anemia. She had hemorrhagic shock. She looks better today but her potassium is slightly low. Active Problems:   DIABETES MELLITUS, TYPE II   HYPERLIPIDEMIA   Morbid obesity with BMI of 40.0-44.9, adult   HYPERTENSION   FIBRILLATION, ATRIAL   Long term current use of anticoagulant   CHF exacerbation   Acute respiratory failure   Acute renal failure   Acute diastolic heart failure   Hematoma of groin   Pseudoaneurysm   Acute blood loss anemia   Hemorrhagic shock    Plan: Attempt weaning.    LOS: 8 days   Hussien Greenblatt L 04/22/2013, 8:08 AM

## 2013-04-22 NOTE — Progress Notes (Signed)
2 Days Post-Op  Subjective: Remains intubated. Awake. Responds appropriately to questioning. No complaints of pain.  Objective: Vital signs in last 24 hours: Temp:  [97.3 F (36.3 Armstrong)-98.4 F (36.9 Armstrong)] 97.3 F (36.3 Armstrong) (06/30 0400) Pulse Rate:  [78-120] 92 (06/30 0645) Resp:  [13-26] 14 (06/30 0645) SpO2:  [99 %-100 %] 100 % (06/30 0645) Arterial Line BP: (69-157)/(46-149) 89/48 mmHg (06/30 0645) FiO2 (%):  [40.2 %-45.5 %] 40.2 % (06/30 0811) Weight:  [98 kg (216 lb 0.8 oz)] 98 kg (216 lb 0.8 oz) (06/30 0500) Last BM Date: 04/14/13  Intake/Output from previous day: 06/29 0701 - 06/30 0700 In: 2167.9 [I.V.:1517.9; IV Piggyback:650] Out: 4150 [Urine:4150] Intake/Output this shift: Total I/O In: 10 [I.V.:10] Out: -   General appearance: alert, no distress and Intubated on vent GI: soft, non-tender; bowel sounds normal; no masses,  no organomegaly Extremities: Right groin dressing intact. Positive ecchymosis but no expansion. Distal foot warm. Palpable right DP and PT (1+)  Lab Results:   Recent Labs  04/22/13 0444 04/22/13 0923  WBC 12.4* 14.7*  HGB 8.8* 8.6*  HCT 26.3* 26.0*  PLT 90* 91*   BMET  Recent Labs  04/21/13 0508 04/22/13 0444  NA 142 142  K 3.0* 3.0*  CL 96 95*  CO2 38* 39*  GLUCOSE 126* 116*  BUN 40* 33*  CREATININE 1.89* 1.74*  CALCIUM 7.2* 8.1*   PT/INR  Recent Labs  04/21/13 0508 04/22/13 0444  LABPROT 15.4* 14.9  INR 1.25 1.20   ABG  Recent Labs  04/22/13 0521 04/22/13 0910  PHART 7.474* 7.405  HCO3 37.4* 38.3*    Studies/Results: Dg Chest Port 1 View  04/22/2013   *RADIOLOGY REPORT*  Clinical Data: Ventilated patient.  Ventilator protocol.  Follow-up radiograph.  Assess support apparatus.  PORTABLE CHEST - 1 VIEW  Comparison: 04/21/2013, 0845 hours.  Findings: The cardiopericardial silhouette is partially obscured but appears enlarged.  Endotracheal tube tip 31 mm from carina. Left upper extremity PICC is present with the tip  at the cavoatrial junction.  Low volume chest.  Shifting airspace disease is present compatible with pulmonary edema.  Underlying pneumonia cannot be excluded.  The layering of a pleural effusions.  IMPRESSION:  1.  Stable support apparatus. 2.  Shifting bilateral airspace disease most compatible with pulmonary edema.  Superimposed basilar atelectasis and shifting pleural effusions.   Original Report Authenticated By: Andreas Newport, M.D.   Dg Chest Port 1 View  04/21/2013   *RADIOLOGY REPORT*  Clinical Data:  Respiratory failure.  PORTABLE CHEST - 1 VIEW  Comparison: 04/20/2013.  None.  Findings: The endotracheal tube and left PICC line are unchanged in position.  Heart size remains enlarged.  Edema throughout the right lung is stable. There is moderate worsening of edema throughout the left lung.  There are no other changes.  There are no effusions or pneumothoraces.  There are no acute bony changes.  IMPRESSION: Worsening edema throughout the left lung compared to the preceding study.   Original Report Authenticated By: Sander Radon, M.D.    Anti-infectives: Anti-infectives   Start     Dose/Rate Route Frequency Ordered Stop   04/21/13 0600  vancomycin (VANCOCIN) IVPB 1000 mg/200 mL premix     1,000 mg 200 mL/hr over 60 Minutes Intravenous Every 24 hours 04/20/13 1012     04/17/13 1000  vancomycin (VANCOCIN) 1,500 mg in sodium chloride 0.9 % 500 mL IVPB  Status:  Discontinued     1,500 mg 250 mL/hr over  120 Minutes Intravenous Every 24 hours 04/17/13 0807 04/20/13 1012   04/17/13 0900  levofloxacin (LEVAQUIN) IVPB 750 mg     750 mg 100 mL/hr over 90 Minutes Intravenous Every 48 hours 04/17/13 0807     04/17/13 0800  levofloxacin (LEVAQUIN) IVPB 500 mg  Status:  Discontinued     500 mg 100 mL/hr over 60 Minutes Intravenous Every 24 hours 04/17/13 0747 04/17/13 0807      Assessment/Plan: s/p Procedure(s): GROIN EXPLORATION (Right) Continues to require chemical ventilation. Fully  anticipate lung ventilation wean. Carina. Stable. Stable from a vascular standpoint with good vascular flow at this point distally. We'll continue to monitor. No acute surgical findings at this time.  LOS: 8 days    Amber Armstrong 04/22/2013

## 2013-04-22 NOTE — Progress Notes (Signed)
Subjective: Interval History: Patient presently alert and awake . She is signaling to me she wants to be extubated. Denies any pain.   Objective: Vital signs in last 24 hours: Temp:  [97.3 F (36.3 C)-98.4 F (36.9 C)] 97.3 F (36.3 C) (06/30 0400) Pulse Rate:  [78-123] 92 (06/30 0645) Resp:  [13-26] 14 (06/30 0645) SpO2:  [99 %-100 %] 100 % (06/30 0645) Arterial Line BP: (69-157)/(46-149) 89/48 mmHg (06/30 0645) FiO2 (%):  [44.6 %-45.5 %] 44.8 % (06/30 0645) Weight:  [98 kg (216 lb 0.8 oz)] 98 kg (216 lb 0.8 oz) (06/30 0500) Weight change: -4.2 kg (-9 lb 4.2 oz)  Intake/Output from previous day: 06/29 0701 - 06/30 0700 In: 2167.9 [I.V.:1517.9; IV Piggyback:650] Out: 4150 [Urine:4150] Intake/Output this shift:    Generally patient is awake and alert. Does not seem to be in any apparent distress. Chest decreased breath sound bilaterally and anteriorly. She has also some inspiratory crackles bilaterally. Heart irregular rate and rhythm no murmur. Her heart rate is controlled Abdomen soft positive bowel sound Extremities she has 1+ lower extremity edema however she has significant upper extremity edema.   Lab Results:  Recent Labs  04/22/13 0112 04/22/13 0444  WBC 12.0* 12.4*  HGB 8.6* 8.8*  HCT 25.6* 26.3*  PLT 94* 90*   BMET:   Recent Labs  04/21/13 0508 04/22/13 0444  NA 142 142  K 3.0* 3.0*  CL 96 95*  CO2 38* 39*  GLUCOSE 126* 116*  BUN 40* 33*  CREATININE 1.89* 1.74*  CALCIUM 7.2* 8.1*   No results found for this basename: PTH,  in the last 72 hours Iron Studies: No results found for this basename: IRON, TIBC, TRANSFERRIN, FERRITIN,  in the last 72 hours  Studies/Results: Dg Chest Port 1 View  04/22/2013   *RADIOLOGY REPORT*  Clinical Data: Ventilated patient.  Ventilator protocol.  Follow-up radiograph.  Assess support apparatus.  PORTABLE CHEST - 1 VIEW  Comparison: 04/21/2013, 0845 hours.  Findings: The cardiopericardial silhouette is partially  obscured but appears enlarged.  Endotracheal tube tip 31 mm from carina. Left upper extremity PICC is present with the tip at the cavoatrial junction.  Low volume chest.  Shifting airspace disease is present compatible with pulmonary edema.  Underlying pneumonia cannot be excluded.  The layering of a pleural effusions.  IMPRESSION:  1.  Stable support apparatus. 2.  Shifting bilateral airspace disease most compatible with pulmonary edema.  Superimposed basilar atelectasis and shifting pleural effusions.   Original Report Authenticated By: Andreas Newport, M.D.   Dg Chest Port 1 View  04/21/2013   *RADIOLOGY REPORT*  Clinical Data:  Respiratory failure.  PORTABLE CHEST - 1 VIEW  Comparison: 04/20/2013.  None.  Findings: The endotracheal tube and left PICC line are unchanged in position.  Heart size remains enlarged.  Edema throughout the right lung is stable. There is moderate worsening of edema throughout the left lung.  There are no other changes.  There are no effusions or pneumothoraces.  There are no acute bony changes.  IMPRESSION: Worsening edema throughout the left lung compared to the preceding study.   Original Report Authenticated By: Sander Radon, M.D.    I have reviewed the patient's current medications.  Assessment/Plan: Problem #1 acute kidney injury superimposed on chronic her BUN and creatinine presently is improving. Most likely secondary to ATN associated with hypotension. Patient is none oliguric.  Problem #2 CHF patient off lasix because of hypotension. CHF seemsvimproving. Patient on lasix drio with good urine  out put. Problem #3 hypokalemia related most likely to her diuretics. Her potassium is has low but stable. Presently on pottassuim supplemnet Problem #4 diabetes Problem #5 a trial fibrillation her heart rate is controlled Problem #6 respiratory failure possibly combination of CHF and also pneumonia. Patient is presently remains intubated. Chest x-ray showed worsening of  her infiltrate. Problem #7 hyperlipidemia Problem #8 obesity Problem #9 hypertension presently patient blood pressure seems to be low normal. Possibly from bleeding. Problem #10 hypernatremia most likely secondary to lack of free water and have normal saline.  Plan:change lasix to a drip to lasix 60 mg iv bid Increase iv with kcl to 100 cc/hr We'll check her basic metabolic panel in the morning.     LOS: 8 days   Amber Armstrong S 04/22/2013,7:29 AM

## 2013-04-22 NOTE — Progress Notes (Signed)
Pts rate decreased to 14 adjusting for PH.

## 2013-04-22 NOTE — Progress Notes (Addendum)
PARENTERAL NUTRITION CONSULT NOTE - INITIAL  Pharmacy Consult for TPN Indication: prolonged intubation  Allergies  Allergen Reactions  . Penicillins Rash   Patient Measurements: Height: 5' (152.4 cm) Weight: 216 lb 0.8 oz (98 kg) IBW/kg (Calculated) : 45.5 Adjusted Body Weight: 67Kg *pt has CHF with fluid retention and weight fluctuates  Vital Signs: Temp: 97.7 F (36.5 C) (06/30 0800) Temp src: Oral (06/30 0800) Pulse Rate: 97 (06/30 1000) Intake/Output from previous day: 06/29 0701 - 06/30 0700 In: 2242.9 [I.V.:1592.9; IV Piggyback:650] Out: 4150 [Urine:4150] Intake/Output from this shift: Total I/O In: 311.3 [I.V.:311.3] Out: -   Labs:  Recent Labs  04/20/13 0531  04/21/13 0508  04/22/13 0112 04/22/13 0444 04/22/13 0923  WBC 9.9  < >  --   < > 12.0* 12.4* 14.7*  HGB 9.8*  < >  --   < > 8.6* 8.8* 8.6*  HCT 27.9*  < >  --   < > 25.6* 26.3* 26.0*  PLT 101*  < >  --   < > 94* 90* 91*  INR 1.51*  --  1.25  --   --  1.20  --   < > = values in this interval not displayed.   Recent Labs  04/20/13 0531 04/21/13 0508 04/22/13 0444  NA 140 142 142  K 4.0 3.0* 3.0*  CL 93* 96 95*  CO2 38* 38* 39*  GLUCOSE 203* 126* 116*  BUN 38* 40* 33*  CREATININE 1.82* 1.89* 1.74*  CALCIUM 7.4* 7.2* 8.1*   Estimated Creatinine Clearance: 34.7 ml/min (by C-G formula based on Cr of 1.74).    Recent Labs  04/21/13 2339 04/22/13 0432 04/22/13 0715  GLUCAP 107* 108* 115*   Medical History: Past Medical History  Diagnosis Date  . Hypertension   . Type 2 diabetes mellitus     Type II  . Atrial fibrillation   . Nephrolithiasis   . Overweight(278.02)   . UTI (urinary tract infection)   . Hypothyroidism (acquired)    Medications:  Scheduled:  . antiseptic oral rinse  15 mL Mouth Rinse QID  . bimatoprost  1 drop Both Eyes QHS  . chlorhexidine  15 mL Mouth Rinse BID  . furosemide  60 mg Intravenous BID  . insulin aspart  2-10 Units Subcutaneous Q4H  .  levofloxacin (LEVAQUIN) IV  750 mg Intravenous Q48H  . Levothyroxine Sodium  100 mcg Intravenous Q24H  . nystatin   Topical BID  . pantoprazole (PROTONIX) IV  40 mg Intravenous Daily  . sodium chloride  10-40 mL Intracatheter Q12H  . vancomycin  1,000 mg Intravenous Q24H    Insulin Requirements in the past 24 hours:  4 units  Current Nutrition:  None.  Initiating TPN while intubated.  Assessment: 62yo female admitted on 6/22 for c/o SOB, swelling to arms and legs with diffuse edema, and CHF.  Of note, pt was on Coumadin PTA for h/o afib.  On 6/25 pt was transferred to ICU due to respiratory failure requiring intubation.  Pt has been diuresed and has improved some.  Pt went to OR emergently on 6/28 due to large groin hematoma with bleeding which required surgical repair.  Pt has elevated SCr.  Estimated Creatinine Clearance: 34.7 ml/min (by C-G formula based on Cr of 1.74).   Hypokalemia noted and K+ is being replenished. I/O = - 1.9 liters  Nutritional Goals:  1800-2000 non-protein kCals, 90-100 grams of protein per day  Plan:  Initiate TPN (Clinimix E 5/15) today at  73ml/hr Adjust maintenance IVF rate accordingly Continue adjusted Sliding Scale insulin with CBG's q4h Replenish electrolytes as needed Monitor labs, renal fxn, glucose tolerance, and fluid status   Margo Aye, Azia Toutant A 04/22/2013,11:05 AM

## 2013-04-23 ENCOUNTER — Inpatient Hospital Stay (HOSPITAL_COMMUNITY): Payer: BC Managed Care – PPO

## 2013-04-23 DIAGNOSIS — I5031 Acute diastolic (congestive) heart failure: Secondary | ICD-10-CM

## 2013-04-23 LAB — BASIC METABOLIC PANEL
BUN: 29 mg/dL — ABNORMAL HIGH (ref 6–23)
CO2: 40 mEq/L (ref 19–32)
Chloride: 93 mEq/L — ABNORMAL LOW (ref 96–112)
Creatinine, Ser: 1.47 mg/dL — ABNORMAL HIGH (ref 0.50–1.10)
Glucose, Bld: 182 mg/dL — ABNORMAL HIGH (ref 70–99)
Potassium: 3.2 mEq/L — ABNORMAL LOW (ref 3.5–5.1)

## 2013-04-23 LAB — CBC
HCT: 25.8 % — ABNORMAL LOW (ref 36.0–46.0)
HCT: 27.3 % — ABNORMAL LOW (ref 36.0–46.0)
HCT: 28.2 % — ABNORMAL LOW (ref 36.0–46.0)
Hemoglobin: 8.1 g/dL — ABNORMAL LOW (ref 12.0–15.0)
Hemoglobin: 8.8 g/dL — ABNORMAL LOW (ref 12.0–15.0)
Hemoglobin: 8.9 g/dL — ABNORMAL LOW (ref 12.0–15.0)
MCH: 29.6 pg (ref 26.0–34.0)
MCHC: 32.6 g/dL (ref 30.0–36.0)
MCHC: 32.9 g/dL (ref 30.0–36.0)
MCHC: 31.6 g/dL (ref 30.0–36.0)
MCV: 90.5 fL (ref 78.0–100.0)
MCV: 89.8 fL (ref 78.0–100.0)
MCV: 90.7 fL (ref 78.0–100.0)
MCV: 91.3 fL (ref 78.0–100.0)
RBC: 3.01 MIL/uL — ABNORMAL LOW (ref 3.87–5.11)
RDW: 17.6 % — ABNORMAL HIGH (ref 11.5–15.5)
RDW: 18.1 % — ABNORMAL HIGH (ref 11.5–15.5)
RDW: 18.3 % — ABNORMAL HIGH (ref 11.5–15.5)
WBC: 15 10*3/uL — ABNORMAL HIGH (ref 4.0–10.5)

## 2013-04-23 LAB — COMPREHENSIVE METABOLIC PANEL
ALT: 10 U/L (ref 0–35)
CO2: 41 mEq/L (ref 19–32)
Calcium: 8.2 mg/dL — ABNORMAL LOW (ref 8.4–10.5)
Creatinine, Ser: 1.5 mg/dL — ABNORMAL HIGH (ref 0.50–1.10)
GFR calc Af Amer: 42 mL/min — ABNORMAL LOW (ref >90)
GFR calc non Af Amer: 36 mL/min — ABNORMAL LOW (ref >90)
Glucose, Bld: 168 mg/dL — ABNORMAL HIGH (ref 70–99)

## 2013-04-23 LAB — DIFFERENTIAL
Basophils Relative: 0 % (ref 0–1)
Eosinophils Absolute: 0.6 10*3/uL (ref 0.0–0.7)
Eosinophils Relative: 4 % (ref 0–5)
Monocytes Relative: 14 % — ABNORMAL HIGH (ref 3–12)
Neutrophils Relative %: 74 % (ref 43–77)

## 2013-04-23 LAB — GLUCOSE, CAPILLARY
Glucose-Capillary: 158 mg/dL — ABNORMAL HIGH (ref 70–99)
Glucose-Capillary: 144 mg/dL — ABNORMAL HIGH (ref 70–99)

## 2013-04-23 LAB — BLOOD GAS, ARTERIAL
Acid-Base Excess: 13.7 mmol/L — ABNORMAL HIGH (ref 0.0–2.0)
Patient temperature: 37
pCO2 arterial: 53.5 mmHg — ABNORMAL HIGH (ref 35.0–45.0)
pH, Arterial: 7.469 — ABNORMAL HIGH (ref 7.350–7.450)

## 2013-04-23 LAB — MAGNESIUM: Magnesium: 1.7 mg/dL (ref 1.5–2.5)

## 2013-04-23 LAB — PHOSPHORUS: Phosphorus: 2 mg/dL — ABNORMAL LOW (ref 2.3–4.6)

## 2013-04-23 MED ORDER — TRACE MINERALS CR-CU-F-FE-I-MN-MO-SE-ZN IV SOLN
INTRAVENOUS | Status: AC
  Administered 2013-04-23: 18:00:00 via INTRAVENOUS
  Filled 2013-04-23: qty 2000

## 2013-04-23 MED ORDER — INSULIN ASPART 100 UNIT/ML ~~LOC~~ SOLN: 0.0000 [IU] | Freq: Every day | SUBCUTANEOUS | Status: DC

## 2013-04-23 MED ORDER — INSULIN ASPART 100 UNIT/ML ~~LOC~~ SOLN
0.0000 [IU] | Freq: Three times a day (TID) | SUBCUTANEOUS | Status: DC
  Administered 2013-04-24: 2 [IU] via SUBCUTANEOUS

## 2013-04-23 MED ORDER — DEXTROSE 5 % IV SOLN
10.0000 mmol | Freq: Once | INTRAVENOUS | Status: AC
  Administered 2013-04-23: 10 mmol via INTRAVENOUS
  Filled 2013-04-23: qty 3.33

## 2013-04-23 MED ORDER — SODIUM CHLORIDE 0.45 % IV SOLN
INTRAVENOUS | Status: AC
  Administered 2013-04-24: 15:00:00 via INTRAVENOUS
  Filled 2013-04-23: qty 1000

## 2013-04-23 NOTE — Progress Notes (Deleted)
Amber Armstrong:[id number] DOB: 1951/06/24 DOA: 04/14/2013   Called to see this lady at 7:50 PM 04/23/2013, in regard to adjustment of insulin schedule   Body mass index is 42.75 kg/(m^2). Motivation and readiness for weight loss intervention . Eager to discuss her actions for losing weight over the past 8 years, which is essentially walking around [place] parking lot every day. However she reports she has not lost any weight using this technique; she is not addressed issues of diet nor portion control. As she says she cannot really address weight loss because she herself is so sick and when she is not sick she has to look after her elderly mother, without any other help. She does not seem to be strongly motivated to lose weight, and does not appear to be in the frame of mind to discuss more effective measures at this time. She side steps to offer of referral to nutritionist.  This issue should be readdressed when she is more stable. Consider referral to a nutritionist at discharge.

## 2013-04-23 NOTE — Progress Notes (Signed)
PARENTERAL NUTRITION CONSULT NOTE  Pharmacy Consult for TPN Indication: prolonged intubation  Allergies  Allergen Reactions  . Penicillins Rash   Patient Measurements: Height: 5' (152.4 cm) Weight: 218 lb 14.7 oz (99.3 kg) IBW/kg (Calculated) : 45.5 Adjusted Body Weight: 67Kg *pt has CHF with fluid retention and weight fluctuates  Vital Signs: Temp: 97.9 F (36.6 C) (07/01 0740) Temp src: Axillary (07/01 0740) BP: 105/95 mmHg (07/01 0700) Pulse Rate: 100 (07/01 0700) Intake/Output from previous day: 06/30 0701 - 07/01 0700 In: 2565.4 [I.V.:1759.6; IV Piggyback:200; TPN:605.8] Out: 4475 [Urine:4475] Intake/Output from this shift:    Labs:  Recent Labs  04/21/13 0508  04/22/13 0444  04/22/13 1745 04/23/13 0120 04/23/13 0451  WBC  --   < > 12.4*  < > 15.6* 14.1* 13.2*  HGB  --   < > 8.8*  < > 8.9* 8.4* 8.1*  HCT  --   < > 26.3*  < > 26.8* 25.8* 24.6*  PLT  --   < > 90*  < > 98* 104* 105*  INR 1.25  --  1.20  --   --   --  1.26  < > = values in this interval not displayed.   Recent Labs  04/21/13 0508 04/22/13 0444 04/23/13 0451  NA 142 142 139  K 3.0* 3.0* 3.4*  CL 96 95* 95*  CO2 38* 39* 41*  GLUCOSE 126* 116* 168*  BUN 40* 33* 29*  CREATININE 1.89* 1.74* 1.50*  CALCIUM 7.2* 8.1* 8.2*  MG  --   --  1.7  PHOS  --   --  2.0*  PROT  --   --  5.3*  ALBUMIN  --   --  2.3*  AST  --   --  18  ALT  --   --  10  ALKPHOS  --   --  67  BILITOT  --   --  1.2  TRIG  --   --  96   Estimated Creatinine Clearance: 40.6 ml/min (by C-G formula based on Cr of 1.5).    Recent Labs  04/23/13 0010 04/23/13 0400 04/23/13 0728  GLUCAP 160* 130* 158*   Medical History: Past Medical History  Diagnosis Date  . Hypertension   . Type 2 diabetes mellitus     Type II  . Atrial fibrillation   . Nephrolithiasis   . Overweight(278.02)   . UTI (urinary tract infection)   . Hypothyroidism (acquired)    Medications:  Scheduled:  . antiseptic oral rinse  15 mL  Mouth Rinse QID  . bimatoprost  1 drop Both Eyes QHS  . chlorhexidine  15 mL Mouth Rinse BID  . furosemide  60 mg Intravenous BID  . insulin aspart  2-10 Units Subcutaneous Q4H  . levofloxacin (LEVAQUIN) IV  750 mg Intravenous Q48H  . Levothyroxine Sodium  100 mcg Intravenous Q24H  . nystatin   Topical BID  . pantoprazole (PROTONIX) IV  40 mg Intravenous Daily  . potassium phosphate IVPB (mmol)  10 mmol Intravenous Once  . sodium chloride  10-40 mL Intracatheter Q12H  . vancomycin  1,000 mg Intravenous Q24H    Insulin Requirements in the past 24 hours:  4 units  Current Nutrition:  None.  Initiating TPN while intubated.  Assessment: 62yo female admitted on 6/22 with HF exacerbation.  On 6/25 pt was transferred to ICU due to respiratory failure requiring intubation.  Pt has been diuresed and has improved some.  Pt went to OR emergently  on 6/28 due to large groin hematoma with bleeding which required surgical repair (on Lovenox for Afib which has been stopped).  She is being started on TPN due to prolonged intubation.  Unclear why tube feedings are not an option- will ask dietician to see & make recommendations.  Patient's renal function continues to improve.  24hr I/O -1.9L Low potassium & phosphate noted.  CBGs have been mildly elevated since initiating TPN.    Nutritional Goals:  1300-1500 non-protein kCals, 90-100 grams of protein per day  Plan:  Increase TPN (Clinimix E 5/15 +MVI & minerals) to 17ml/hr.  Add 10 units insulin to TPN  Adjust maintenance IVF rate accordingly Continue adjusted Sliding Scale insulin with CBG's q4h KPhos IV x1 Monitor labs, renal fxn, glucose tolerance, and fluid status   Elson Clan 04/23/2013,9:00 AM

## 2013-04-23 NOTE — Progress Notes (Signed)
Primary cardiologist: Dr. Valera Castle  SUBJECTIVE: Complaints of pain in right groin from hematoma. Remains intubated.    LABS: Basic Metabolic Panel:  Recent Labs  29/56/21 0444 04/23/13 0451  NA 142 139  K 3.0* 3.4*  CL 95* 95*  CO2 39* 41*  GLUCOSE 116* 168*  BUN 33* 29*  CREATININE 1.74* 1.50*  CALCIUM 8.1* 8.2*  MG  --  1.7  PHOS  --  2.0*   Liver Function Tests:  Recent Labs  04/23/13 0451  AST 18  ALT 10  ALKPHOS 67  BILITOT 1.2  PROT 5.3*  ALBUMIN 2.3*   CBC:  Recent Labs  04/23/13 0120 04/23/13 0451  WBC 14.1* 13.2*  NEUTROABS  --  9.7*  HGB 8.4* 8.1*  HCT 25.8* 24.6*  MCV 90.5 89.8  PLT 104* 105*    Fasting Lipid Panel:  Recent Labs  04/23/13 0451  TRIG 96    RADIOLOGY: Dg Chest Port 1 View  04/22/2013   *RADIOLOGY REPORT*  Clinical Data: Line placement  PORTABLE CHEST - 1 VIEW  Comparison: Portable exam 1247 hours compared to 04/22/2013  Findings: Tip of the left arm PICC line projects over SVC near cavoatrial junction. Enlargement of cardiac silhouette with pulmonary vascular congestion. Diffuse infiltrates most consistent with pulmonary edema and CHF. Tip of endotracheal tube in satisfactory position above carina. Probable small left pleural effusion. No pneumothorax.  IMPRESSION: Persistent CHF. Tip of left arm PICC line projects over SVC near the cavoatrial junction.   Original Report Authenticated By: Ulyses Southward, M.D.    PHYSICAL EXAM BP 105/95  Pulse 100  Temp(Src) 97.9 F (36.6 C) (Axillary)  Resp 18  Ht 5' (1.524 m)  Wt 218 lb 14.7 oz (99.3 kg)  BMI 42.75 kg/m2  SpO2 100% WT loss 21 lbs from highest recorded this admission. General: No acute distress Cor: Irregularly irregular, no gallop Lungs: Clear bilaterally to auscultation, coarse breath sounds. Abdomen: Bowel sounds are positive, abdomen soft. Extremities: Hematoma to right groin, pain with palpation or movement. Erythema with discoloration is noted. Dressing  with drainage noted. Generalized edema.   DP +1 Neuro: Alert and oriented X 3.  TELEMETRY: Atrial fibrillation rates in the 70-80s.    ASSESSMENT AND PLAN:  1. Cor Pulmonale: Continues diureses with 21 lb wt loss noted since admission. Remains edematous with pulmonary edema on CXR. She has diuresed 1800 cc over last 24 hours. Continues on lasix 60 mg IV Q 12 hours and dopamine infusion. Creatinine 1.50. Slow progress. Consider adding spironolactone as kidney fx has recovered.  2. VDRF: Multifactorial with right sided heart failure and hypoventilation syndrome. She is difficult to wean. Refuses pulmonary rehab ctr to assist with vent weaning. Dr. Juanetta Gosling managing.  3. Atrial fibrillation: No anticoagulation secondary to right groin bleed from femoral arterial line site, which has now been removed.   4. Hypokalemia: Potassium 3.4 this am. Will give replacement.  5.Anemia: Acute blood loss from right femoral arterial line bleed. Hgb is decreasing from 8.4 to 8.1 this am. Consider blood transfusion if lower to assist with CHF and oxygenation.   Bettey Mare. Lyman Bishop NP [facility] 04/23/2013, 8:14 AM   Attending note:  Patient seen and examined. Reviewed interval followup notes. Modified above note by Ms. Lawrence NP. Patient has had a significant diuresis overall during hospitalization, will plan to continue intravenous Lasix and renal dose dopamine. She is diuresing approximately 2000 cc per 24 hours over the last 2 days. Heart rate  trend is coming up somewhat. She is not on any rate lowering medications at this time. Will probably improve off dopamine eventually. Goal should be further volume removal to assist with weaning from mechanical ventilation. She is not anticoagulated the setting of groin hematoma. Our service will continue to follow with you.  Jonelle Sidle, M.D., F.A.C.C.

## 2013-04-23 NOTE — Clinical Social Work Psychosocial (Signed)
    Clinical Social Work Department BRIEF PSYCHOSOCIAL ASSESSMENT 04/23/2013  Patient:  Amber Armstrong, Amber Armstrong     Account Number:  [id number]     Admit date:  04/14/2013  Clinical Social Worker:  Santa Genera, CLINICAL SOCIAL WORKER  Date/Time:  04/23/2013 11:30 AM  Referred by:  CSW  Date Referred:  04/23/2013  Other Referral:   Interview type:  Patient Other interview type:   Also spoke w family at bedside.    PSYCHOSOCIAL DATA Living Status:  ALONE Admitted from facility:   Level of care:   Primary support name:  Annitta Needs Primary support relationship to patient:  PARENT Degree of support available:   Limited due to mother's ability to provide physical care, has significant psychosocial support from family and friends.    CURRENT CONCERNS Current Concerns  Post-Acute Placement   Other Concerns:    SOCIAL WORK ASSESSMENT / PLAN CSW met briefly w patient - patient recently extubated, unable to speak much.  Nodded head when asked if she wants SNF placement at discharge.  Mother, age 63, is patient's primary support.  Mother says she does not drive and could not visit her child if she is placed too far away.  Wants [facility] in [place] if possible.  CSW did not complete assessment due to patient's physical limitations at present, will continue to work w patient as her condition improves.   Assessment/plan status:  Psychosocial Support/Ongoing Assessment of Needs Other assessment/ plan:   Information/referral to community resources:   SNF list    PATIENT'S/FAMILY'S RESPONSE TO PLAN OF CARE: Patient nods head and agrees to CSW pursuing placement, patient's mother in agreement.     Santa Genera, LCSW Clinical Social Worker [phone])

## 2013-04-23 NOTE — Progress Notes (Signed)
[facility] PROGRESS NOTE  Amber Armstrong ZOX:[id number] DOB: [birth year] DOA: 04/14/2013 PCP: Pershing Proud  Brief Narrative  This lady was admitted to the hospital with shortness of breath due to CHF exacerbation.  She was started on intravenous lasix but did not have any significant response.  On 6/25 patient was found to be unresponsive and had a significant respiratory acidosis with a pco2 of 125 and pH of 7.06.  She was transferred to the ICU and intubated.  She was followed by cardiology for CHF, pulmonology for ventilator management and nephrology for acute renal failure.  Patient was found to be hypotensive, so femoral central line was placed by general surgery for vasopressor support to aide with diuresis.  Patient began to experience good response to lasix and overall condition was starting to improve. Creatinine also began to improve with diuresis.  Patient began to experience thick secretions from ET tube, and chest xray raised concerns for possible developing pneumonia.  She was started empirically on vancomycin and levofloxacin. Patient subsequently had a picc line placed for continued central access.  Once Picc line was placed, femoral central line was discontinued.  Unfortunately, patient had significant bleeding from right groin and developed a large right groin hematoma with pseudoaneurysm.  This was also complicated by the fact that patient was on full dose lovenox for atrial fibrillation. This eventually required operative repair by Dr. Leticia Penna.  Patient was transfused multiple units of FFP and PRBC for bleeding and anemia.  At this time, it appears that her bleeding has resolved.  By 7/1 morning, patient was able to be successfully weaned off the ventilator.  Assessment/Plan:   1. Hemorrhagic shock. Due to bleeding from right groin site. Blood pressure still slightly soft. Status post 5 units packed red blood cells 2. Acute blood loss anemia. Due to bleeding from right  groin site. Patient received 5 units of PRBCs and 2 units of FFP his overnight. Hemoglobin unchanged 3. Acute on chronic respiratory failure, multifactorial. Patient extubated off the ventilator this morning. ventilator. Continuing diuresis. If remains stable, could potentially transfer to floor later today. 4. Acute right-sided congestive heart failure. She continues to have evidence of heart failure on chest x-ray as well as peripheral edema. Currently she is -7 L since admission Continue diuresis and appreciate cardiology help 5. Atrial fibrillation. Patient is having episodes of intermittent tachycardia. Will need to be cautious with beta blockers since patient is prone to hypotension. Anticoagulation has been discontinued due to bleeding from right groin site. 6. Acute renal failure. Creatinine continued to improve, currently 1.47 the lowest it's been since admission Nephrology is following.  Considering starting the patient on lasix infusion.  Continue to monitor urine output 7. Type 2 diabetes. Blood sugars have been stable. 8. Hypokalemia: Replace 9. Acute right groin hematoma with pseudoaneurysm. Patient had significant bleeding from right groin when her femoral central line was discontinued yesterday, complicated by the fact that she was anticoagulated with Lovenox. Patient received protamine as well as FFP to reverse her coagulopathy. She had significant bleeding requiring transfusion of PRBCs and FFPs. Ultimately, she required surgical repair by Dr. Leticia Penna to achieve hemostasis.  Stable today. 10. Nutrition: Patient started on TPN yesterday   Code Status: Patient was made DO NOT RESUSCITATE by Dr. Karilyn Cota.; Family Communication: Discussed with patient's sister and mother yesterday and mother at the bedside today Disposition Plan: Now that she is extubated, hopefully will remain stable and therefore we can transfer to floor. Likely will need short-term  skilled nursing for  rehabilitation   Consultants:  [facility] cardiology  Nephrology, Dr. Kristian Covey  General surgery, Dr. Leticia Penna  Pulmonary, Dr. Juanetta Gosling  Procedures:  Intubation on 6/25, extubated 7/1  Central line placement on 6/25, by Dr. Leticia Penna  Right groin exploration, suture repair of right femoral vein, suture repair artery, right radial arterial line placement 6/28 by Dr. Leticia Penna   Antibiotics: Vancomycin 6/25 Levofloxacin 6/25  HPI/Subjective: Patient extubated, awake. Breathing comfortably. Slightly hoarse but otherwise no complaints.  Objective: Filed Vitals:   04/23/13 0500 04/23/13 0503 04/23/13 0700 04/23/13 0740  BP:   105/95   Pulse: 92  100   Temp:    97.9 F (36.6 C)  TempSrc:    Axillary  Resp: 16  18   Height:      Weight:  99.3 kg (218 lb 14.7 oz)    SpO2: 100%       Intake/Output Summary (Last 24 hours) at 04/23/13 1101 Last data filed at 04/23/13 0622  Gross per 24 hour  Intake 2154.17 ml  Output   4475 ml  Net -2320.83 ml   Filed Weights   04/21/13 0500 04/22/13 0500 04/23/13 0503  Weight: 102.2 kg (225 lb 5 oz) 98 kg (216 lb 0.8 oz) 99.3 kg (218 lb 14.7 oz)    Exam:   General:  NAD, awake and no acute distress  Cardiovascular: Irregular rhythm, rate controlled  Respiratory: Occasional rhonchi, mostly clear  Abdomen: soft, nt, nd, bs+ Musculoskeletal: 1+ edema b/l in lower extremities,  Data Reviewed: Basic Metabolic Panel:  Recent Labs Lab 04/17/13 0447 04/18/13 0430 04/19/13 0416 04/19/13 2325 04/20/13 0531 04/21/13 0508 04/22/13 0444 04/23/13 0451  NA 141 147* 143 139 140 142 142 139  K 5.3* 3.1* 3.4* 3.5 4.0 3.0* 3.0* 3.4*  CL 100 101 94* 91* 93* 96 95* 95*  CO2 36* 42* 40* 38* 38* 38* 39* 41*  GLUCOSE 122* 97 124* 200* 203* 126* 116* 168*  BUN 47* 44* 39* 35* 38* 40* 33* 29*  CREATININE 2.09* 1.76* 1.77* 1.64* 1.82* 1.89* 1.74* 1.50*  CALCIUM 9.6 8.8 8.4 7.7* 7.4* 7.2* 8.1* 8.2*  MG  --   --   --   --   --   --   --  1.7   PHOS  --  1.6* 2.8  --   --   --   --  2.0*   Liver Function Tests:  Recent Labs Lab 04/17/13 0447 04/23/13 0451  AST 25 18  ALT 11 10  ALKPHOS 131* 67  BILITOT 0.7 1.2  PROT 7.8 5.3*  ALBUMIN 3.4* 2.3*   CBC:  Recent Labs Lab 04/17/13 0447  04/22/13 0923 04/22/13 1745 04/23/13 0120 04/23/13 0451 04/23/13 1034  WBC 8.9  < > 14.7* 15.6* 14.1* 13.2* 15.0*  NEUTROABS 6.0  --   --   --   --  9.7*  --   HGB 12.9  < > 8.6* 8.9* 8.4* 8.1* 8.8*  HCT 46.7*  < > 26.0* 26.8* 25.8* 24.6* 27.3*  MCV 89.6  < > 89.0 89.3 90.5 89.8 90.7  PLT 317  < > 91* 98* 104* 105* 118*  < > = values in this interval not displayed. BNP (last 3 results)  Recent Labs  04/14/13 1030  PROBNP 12159.0*   CBG:  Recent Labs Lab 04/22/13 1526 04/22/13 1956 04/23/13 0010 04/23/13 0400 04/23/13 0728  GLUCAP 106* 141* 160* 130* 158*       Studies: Dg Chest Port 1  View  04/22/2013  IMPRESSION:  1.  Stable support apparatus. 2.  Shifting bilateral airspace disease most compatible with pulmonary edema.  Superimposed basilar atelectasis and shifting pleural effusions.   Original Report Authenticated By: Andreas Newport, M.D.   Dg Chest Port 1 View  04/21/2013    IMPRESSION: Worsening edema throughout the left lung compared to the preceding study.   Original Report Authenticated By: Sander Radon, M.D.    Scheduled Meds: . antiseptic oral rinse  15 mL Mouth Rinse QID  . bimatoprost  1 drop Both Eyes QHS  . furosemide  60 mg Intravenous BID  . insulin aspart  2-10 Units Subcutaneous Q4H  . levofloxacin (LEVAQUIN) IV  750 mg Intravenous Q48H  . Levothyroxine Sodium  100 mcg Intravenous Q24H  . nystatin   Topical BID  . pantoprazole (PROTONIX) IV  40 mg Intravenous Daily  . potassium phosphate IVPB (mmol)  10 mmol Intravenous Once  . sodium chloride  10-40 mL Intracatheter Q12H  . vancomycin  1,000 mg Intravenous Q24H   Continuous Infusions: . Marland KitchenTPN (CLINIMIX-E) Adult    . DOPamine  Stopped (04/20/13 1005)  . Marland KitchenTPN (CLINIMIX-E) Adult 40 mL/hr at 04/23/13 0515   And  . fat emulsion 250 mL (04/23/13 0515)  . sodium chloride 0.45 % 1,000 mL with potassium chloride 40 mEq infusion 60 mL/hr at 04/23/13 0515  . sodium chloride 0.45 % 1,000 mL with potassium chloride 40 mEq infusion      Active Problems:   DIABETES MELLITUS, TYPE II   HYPERLIPIDEMIA   Morbid obesity with BMI of 40.0-44.9, adult   HYPERTENSION   FIBRILLATION, ATRIAL   Long term current use of anticoagulant   CHF exacerbation   Acute respiratory failure   Acute renal failure   Acute diastolic heart failure   Hematoma of groin   Pseudoaneurysm   Acute blood loss anemia   Hemorrhagic shock    Time spent: 25 mins    Hollice Espy  [facility] Pager [phone]. If 7PM-7AM, please contact night-coverage at [email or web address], password [facility] 04/23/2013, 11:01 AM  LOS: 9 days

## 2013-04-23 NOTE — Progress Notes (Signed)
Pt extubated to 4l [place] HR stable 118, RR 18, NIF -37, and FVC .9L. Pt following directions and has a good cough. BP a little low 92/80. Pt is suctioning herself and talking.

## 2013-04-23 NOTE — Progress Notes (Signed)
ANTIBIOTIC CONSULT NOTE   Pharmacy Consult for Vancomycin Indication: pneumonia  Allergies  Allergen Reactions  . Penicillins Rash   Patient Measurements: Height: 5' (152.4 cm) Weight: 218 lb 14.7 oz (99.3 kg) IBW/kg (Calculated) : 45.5  Vital Signs: Temp: 97.9 F (36.6 C) (07/01 0740) Temp src: Axillary (07/01 0740) BP: 105/95 mmHg (07/01 0700) Pulse Rate: 100 (07/01 0700) Intake/Output from previous day: 06/30 0701 - 07/01 0700 In: 2565.4 [I.V.:1759.6; IV Piggyback:200; TPN:605.8] Out: 4475 [Urine:4475] Intake/Output from this shift:    Labs:  Recent Labs  04/21/13 0508  04/22/13 0444  04/22/13 1745 04/23/13 0120 04/23/13 0451  WBC  --   < > 12.4*  < > 15.6* 14.1* 13.2*  HGB  --   < > 8.8*  < > 8.9* 8.4* 8.1*  PLT  --   < > 90*  < > 98* 104* 105*  CREATININE 1.89*  --  1.74*  --   --   --  1.50*  < > = values in this interval not displayed. Estimated Creatinine Clearance: 40.6 ml/min (by C-G formula based on Cr of 1.5). No results found for this basename: VANCOTROUGH, Leodis Binet, VANCORANDOM, GENTTROUGH, GENTPEAK, GENTRANDOM, TOBRATROUGH, TOBRAPEAK, TOBRARND, AMIKACINPEAK, AMIKACINTROU, AMIKACIN,  in the last 72 hours  Microbiology: Recent Results (from the past 720 hour(s))  MRSA PCR SCREENING     Status: None   Collection Time    04/17/13  6:18 AM      Result Value Range Status   MRSA by PCR NEGATIVE  NEGATIVE Final   Comment:            The GeneXpert MRSA Assay (FDA     approved for NASAL specimens     only), is one component of a     comprehensive MRSA colonization     surveillance program. It is not     intended to diagnose MRSA     infection nor to guide or     monitor treatment for     MRSA infections.  URINE CULTURE     Status: None   Collection Time    04/17/13 11:40 AM      Result Value Range Status   Specimen Description URINE, CLEAN CATCH   Final   Special Requests NONE   Final   Culture  Setup Time 04/17/2013 12:20   Final   Colony  Count NO GROWTH   Final   Culture NO GROWTH   Final   Report Status 04/18/2013 FINAL   Final   Medical History: Past Medical History  Diagnosis Date  . Hypertension   . Type 2 diabetes mellitus     Type II  . Atrial fibrillation   . Nephrolithiasis   . Overweight(278.02)   . UTI (urinary tract infection)   . Hypothyroidism (acquired)    Medications:  Scheduled:  . antiseptic oral rinse  15 mL Mouth Rinse QID  . bimatoprost  1 drop Both Eyes QHS  . chlorhexidine  15 mL Mouth Rinse BID  . furosemide  60 mg Intravenous BID  . insulin aspart  2-10 Units Subcutaneous Q4H  . levofloxacin (LEVAQUIN) IV  750 mg Intravenous Q48H  . Levothyroxine Sodium  100 mcg Intravenous Q24H  . nystatin   Topical BID  . pantoprazole (PROTONIX) IV  40 mg Intravenous Daily  . sodium chloride  10-40 mL Intracatheter Q12H  . vancomycin  1,000 mg Intravenous Q24H   Assessment: 62 yo obese F admitted with HF exacerbation has now decompensated, on  ventilator with suspicion for HAP.  She is currently on day#7 Vancomycin & Levaquin. Acute renal failure continues to improve.  Patient remains afebrile, cx data negative, and WBC trending down.  Vancomycin dose as adjusted 6/28 for elevated trough level.   Levaquin 04/17/13>> Vancomycin 04/17/13>>  Goal of Therapy:  Vancomycin trough level 15-20 mcg/ml  Plan:  1) Continue Levaquin 750mg  IV q48h 2  Continue Vancomycin 1000mg  IV Q24h 3) Re-check Vancomycin trough tomorrow 4) Monitor renal function and cx data   Elson Clan 04/23/2013,8:47 AM

## 2013-04-23 NOTE — Progress Notes (Addendum)
INITIAL NUTRITION ASSESSMENT  DOCUMENTATION CODES Per approved criteria  -Obesity Class III   INTERVENTION:  TPN per pharmacy  Recommend ST to evaluate for appropriate texture/liquid consistency. When diet is advanced will evaluate for nutrition supplement if indicated.  RD will continue to follow nutrition care  NUTRITION DIAGNOSIS: Inadequate oral intake related to inability to eat as evidenced by NPO status.  Goal: Pt will successfully transition from TPN  to meet > 90% of her nutritional needs via oral intake.  Monitor:  Nutrition care plan progression  Reason for Assessment: New TPN    62 y.o. female  Admitting Dx: acute respiratory failure  ASSESSMENT: Pt extubated this morning. She is continues to be NPO at this time. Enteral feeding not attempted likely due to hemodynamic instability (Hemorrhagic shock). She is s/p 5 units PRBC. TPN iInitiated yesterday. Clinimix 5/15 (MVI & minerals) advanced to 60 ml/hr next bag. Pt nutrition needs re-estimated.  Although, hopefully pt will be able to resume oral nutrition soon. Would be beneficial to have ST eval for appropriate diet/liquid consistancy. Pt last recorded BM 04/14/13.  Her appetite good and wt stable prior to acute illness. Lives with her 60 yo mother who is very active. Her mother says pt mouth is still sore due to having 8 teeth extracted recently and when she is able to resume oral nutrition would prefer soft foods. Usual home diet is Regular. No food allergies.   Height: Ht Readings from Last 1 Encounters:  04/22/13 5' (1.524 m)    Weight: Wt Readings from Last 1 Encounters:  04/23/13 218 lb 14.7 oz (99.3 kg)    Ideal Body Weight: 100# (45.4 kg)  % Ideal Body Weight: 219%  Wt Readings from Last 10 Encounters:  04/23/13 218 lb 14.7 oz (99.3 kg)  04/23/13 218 lb 14.7 oz (99.3 kg)  12/12/12 202 lb 1.3 oz (91.663 kg)  04/11/12 204 lb (92.534 kg)  03/28/12 205 lb (92.987 kg)  04/19/11 201 lb (91.173 kg)   09/23/10 201 lb (91.173 kg)  06/23/10 201 lb (91.173 kg)  06/14/10 199 lb (90.266 kg)  12/10/09 206 lb (93.441 kg)    Usual Body Weight: 200-205#  % Usual Body Weight: 109%  BMI:  Body mass index is 42.75 kg/(m^2). Extreme Obesity Class III (skewed due to pt fluid status)  Estimated Nutritional Needs: Kcal: 1200-1500 Protein: 90-112 gr Fluid: per MD goals  Skin: No issues noted  Diet Order: NPO  EDUCATION NEEDS: -No education needs identified at this time   Intake/Output Summary (Last 24 hours) at 04/23/13 1154 Last data filed at 04/23/13 0622  Gross per 24 hour  Intake 2154.17 ml  Output   4475 ml  Net -2320.83 ml    Last BM: 04/14/13  Labs:   Recent Labs Lab 04/18/13 0430 04/19/13 0416  04/22/13 0444 04/23/13 0451 04/23/13 1035  NA 147* 143  < > 142 139 138  K 3.1* 3.4*  < > 3.0* 3.4* 3.2*  CL 101 94*  < > 95* 95* 93*  CO2 42* 40*  < > 39* 41* 40*  BUN 44* 39*  < > 33* 29* 29*  CREATININE 1.76* 1.77*  < > 1.74* 1.50* 1.47*  CALCIUM 8.8 8.4  < > 8.1* 8.2* 8.4  MG  --   --   --   --  1.7  --   PHOS 1.6* 2.8  --   --  2.0*  --   GLUCOSE 97 124*  < > 116* 168* 182*  < > =  values in this interval not displayed.  CBG (last 3)   Recent Labs  04/23/13 0400 04/23/13 0728 04/23/13 1131  GLUCAP 130* 158* 161*    Scheduled Meds: . antiseptic oral rinse  15 mL Mouth Rinse QID  . bimatoprost  1 drop Both Eyes QHS  . furosemide  60 mg Intravenous BID  . insulin aspart  2-10 Units Subcutaneous Q4H  . levofloxacin (LEVAQUIN) IV  750 mg Intravenous Q48H  . Levothyroxine Sodium  100 mcg Intravenous Q24H  . nystatin   Topical BID  . pantoprazole (PROTONIX) IV  40 mg Intravenous Daily  . potassium phosphate IVPB (mmol)  10 mmol Intravenous Once  . sodium chloride  10-40 mL Intracatheter Q12H  . vancomycin  1,000 mg Intravenous Q24H    Continuous Infusions: . Marland KitchenTPN (CLINIMIX-E) Adult    . DOPamine Stopped (04/20/13 1005)  . Marland KitchenTPN (CLINIMIX-E) Adult 40  mL/hr at 04/23/13 0515   And  . fat emulsion 250 mL (04/23/13 0515)  . sodium chloride 0.45 % 1,000 mL with potassium chloride 40 mEq infusion 60 mL/hr at 04/23/13 0515  . sodium chloride 0.45 % 1,000 mL with potassium chloride 40 mEq infusion      Past Medical History  Diagnosis Date  . Hypertension   . Type 2 diabetes mellitus     Type II  . Atrial fibrillation   . Nephrolithiasis   . Overweight(278.02)   . UTI (urinary tract infection)   . Hypothyroidism (acquired)     Past Surgical History  Procedure Laterality Date  . Thyroidectomy    . Groin dissection Right 04/20/2013    Procedure: GROIN EXPLORATION;  Surgeon: Fabio Bering, MD;  Location: [facility];  Service: General;  Laterality: Right;    Royann Shivers MS,RD,LDN,CSG Office: [phone] Pager: [phone]

## 2013-04-23 NOTE — Clinical Social Work Placement (Signed)
     Clinical Social Work Department CLINICAL SOCIAL WORK PLACEMENT NOTE 04/24/2013  Patient:  Amber Armstrong, Amber Armstrong  Account Number:  [id number] Admit date:  04/14/2013  Clinical Social Worker:  Santa Genera, CLINICAL SOCIAL WORKER  Date/time:  04/23/2013 11:30 PM  Clinical Social Work is seeking post-discharge placement for this patient at the following level of care:   SKILLED NURSING   (*CSW will update this form in Epic as items are completed)   04/23/2013  Patient/family provided with [facility] Department of Clinical Social Works list of facilities offering this level of care within the geographic area requested by the patient (or if unable, by the patients family).  04/23/2013  Patient/family informed of their freedom to choose among providers that offer the needed level of care, that participate in Medicare, Medicaid or managed care program needed by the patient, have an available bed and are willing to accept the patient.  04/23/2013  Patient/family informed of [facility] ownership interest in [facility], as well as of the fact that they are under no obligation to receive care at this facility.  PASARR submitted to EDS on 04/23/2013 PASARR number received from EDS on 04/23/2013  FL2 transmitted to all facilities in geographic area requested by pt/family on  04/23/2013 FL2 transmitted to all facilities within larger geographic area on   Patient informed that his/her managed care company has contracts with or will negotiate with  certain facilities, including the following:     Patient/family informed of bed offers received:  04/24/2013 Patient chooses bed at [facility] [place] Physician recommends and patient chooses bed at    Patient to be transferred to  on   Patient to be transferred to facility by   The following physician request were entered in Epic:   Additional Comments: Patient has [employer] [place] PPO.  Precert number is [id number] manager  is Octavio Graves ([phone]) Patient and family asked to contact [employer] for information on financial arrangements, per facility patient has $2000 left to pay on deductible.  Patient aware and will ask [employer] for payment plan.

## 2013-04-23 NOTE — Progress Notes (Signed)
Subjective: She is awake and alert and indicates that she would like to have the tube out. She was accepted for an LTAC but really does not want to go because of family issues.  Objective: Vital signs in last 24 hours: Temp:  [97.4 F (36.3 C)-97.8 F (36.6 C)] 97.8 F (36.6 C) (07/01 0400) Pulse Rate:  [88-110] 100 (07/01 0700) Resp:  [13-26] 18 (07/01 0700) BP: (105)/(95) 105/95 mmHg (07/01 0700) SpO2:  [98 %-100 %] 100 % (07/01 0500) Arterial Line BP: (62-149)/(49-95) 104/84 mmHg (07/01 0500) FiO2 (%):  [39.7 %-44.9 %] 40 % (07/01 0700) Weight:  [99.3 kg (218 lb 14.7 oz)] 99.3 kg (218 lb 14.7 oz) (07/01 0503) Weight change: 1.3 kg (2 lb 13.9 oz) Last BM Date: 04/14/13  Intake/Output from previous day: 06/30 0701 - 07/01 0700 In: 2565.4 [I.V.:1759.6; IV Piggyback:200; TPN:605.8] Out: 4475 [Urine:4475]  PHYSICAL EXAM General appearance: alert, cooperative and mild distress Resp: rhonchi bilaterally Cardio: irregularly irregular rhythm GI: soft, non-tender; bowel sounds normal; no masses,  no organomegaly Extremities: extremities normal, atraumatic, no cyanosis or edema  Lab Results:    Basic Metabolic Panel:  Recent Labs  40/98/11 0444 04/23/13 0451  NA 142 139  K 3.0* 3.4*  CL 95* 95*  CO2 39* 41*  GLUCOSE 116* 168*  BUN 33* 29*  CREATININE 1.74* 1.50*  CALCIUM 8.1* 8.2*  MG  --  1.7  PHOS  --  2.0*   Liver Function Tests:  Recent Labs  04/23/13 0451  AST 18  ALT 10  ALKPHOS 67  BILITOT 1.2  PROT 5.3*  ALBUMIN 2.3*   No results found for this basename: LIPASE, AMYLASE,  in the last 72 hours No results found for this basename: AMMONIA,  in the last 72 hours CBC:  Recent Labs  04/23/13 0120 04/23/13 0451  WBC 14.1* 13.2*  NEUTROABS  --  9.7*  HGB 8.4* 8.1*  HCT 25.8* 24.6*  MCV 90.5 89.8  PLT 104* 105*   Cardiac Enzymes: No results found for this basename: CKTOTAL, CKMB, CKMBINDEX, TROPONINI,  in the last 72 hours BNP: No results found  for this basename: PROBNP,  in the last 72 hours D-Dimer: No results found for this basename: DDIMER,  in the last 72 hours CBG:  Recent Labs  04/22/13 1115 04/22/13 1526 04/22/13 1956 04/23/13 0010 04/23/13 0400 04/23/13 0728  GLUCAP 108* 106* 141* 160* 130* 158*   Hemoglobin A1C: No results found for this basename: HGBA1C,  in the last 72 hours Fasting Lipid Panel:  Recent Labs  04/23/13 0451  TRIG 96   Thyroid Function Tests: No results found for this basename: TSH, T4TOTAL, FREET4, T3FREE, THYROIDAB,  in the last 72 hours Anemia Panel: No results found for this basename: VITAMINB12, FOLATE, FERRITIN, TIBC, IRON, RETICCTPCT,  in the last 72 hours Coagulation:  Recent Labs  04/22/13 0444 04/23/13 0451  LABPROT 14.9 15.5*  INR 1.20 1.26   Urine Drug Screen: Drugs of Abuse  No results found for this basename: labopia, cocainscrnur, labbenz, amphetmu, thcu, labbarb    Alcohol Level: No results found for this basename: ETH,  in the last 72 hours Urinalysis: No results found for this basename: COLORURINE, APPERANCEUR, LABSPEC, PHURINE, GLUCOSEU, HGBUR, BILIRUBINUR, KETONESUR, PROTEINUR, UROBILINOGEN, NITRITE, LEUKOCYTESUR,  in the last 72 hours Misc. Labs:  ABGS  Recent Labs  04/22/13 0910  PHART 7.405  PO2ART 83.1  TCO2 34.1  HCO3 38.3*   CULTURES Recent Results (from the past 240 hour(s))  MRSA  PCR SCREENING     Status: None   Collection Time    04/17/13  6:18 AM      Result Value Range Status   MRSA by PCR NEGATIVE  NEGATIVE Final   Comment:            The GeneXpert MRSA Assay (FDA     approved for NASAL specimens     only), is one component of a     comprehensive MRSA colonization     surveillance program. It is not     intended to diagnose MRSA     infection nor to guide or     monitor treatment for     MRSA infections.  URINE CULTURE     Status: None   Collection Time    04/17/13 11:40 AM      Result Value Range Status   Specimen  Description URINE, CLEAN CATCH   Final   Special Requests NONE   Final   Culture  Setup Time 04/17/2013 12:20   Final   Colony Count NO GROWTH   Final   Culture NO GROWTH   Final   Report Status 04/18/2013 FINAL   Final   Studies/Results: Dg Chest Port 1 View  04/22/2013   *RADIOLOGY REPORT*  Clinical Data: Line placement  PORTABLE CHEST - 1 VIEW  Comparison: Portable exam 1247 hours compared to 04/22/2013  Findings: Tip of the left arm PICC line projects over SVC near cavoatrial junction. Enlargement of cardiac silhouette with pulmonary vascular congestion. Diffuse infiltrates most consistent with pulmonary edema and CHF. Tip of endotracheal tube in satisfactory position above carina. Probable small left pleural effusion. No pneumothorax.  IMPRESSION: Persistent CHF. Tip of left arm PICC line projects over SVC near the cavoatrial junction.   Original Report Authenticated By: Ulyses Southward, M.D.   Dg Chest Port 1 View  04/22/2013   *RADIOLOGY REPORT*  Clinical Data: Ventilated patient.  Ventilator protocol.  Follow-up radiograph.  Assess support apparatus.  PORTABLE CHEST - 1 VIEW  Comparison: 04/21/2013, 0845 hours.  Findings: The cardiopericardial silhouette is partially obscured but appears enlarged.  Endotracheal tube tip 31 mm from carina. Left upper extremity PICC is present with the tip at the cavoatrial junction.  Low volume chest.  Shifting airspace disease is present compatible with pulmonary edema.  Underlying pneumonia cannot be excluded.  The layering of a pleural effusions.  IMPRESSION:  1.  Stable support apparatus. 2.  Shifting bilateral airspace disease most compatible with pulmonary edema.  Superimposed basilar atelectasis and shifting pleural effusions.   Original Report Authenticated By: Andreas Newport, M.D.   Dg Chest Port 1 View  04/21/2013   *RADIOLOGY REPORT*  Clinical Data:  Respiratory failure.  PORTABLE CHEST - 1 VIEW  Comparison: 04/20/2013.  None.  Findings: The endotracheal  tube and left PICC line are unchanged in position.  Heart size remains enlarged.  Edema throughout the right lung is stable. There is moderate worsening of edema throughout the left lung.  There are no other changes.  There are no effusions or pneumothoraces.  There are no acute bony changes.  IMPRESSION: Worsening edema throughout the left lung compared to the preceding study.   Original Report Authenticated By: Sander Radon, M.D.    Medications:  Scheduled: . antiseptic oral rinse  15 mL Mouth Rinse QID  . bimatoprost  1 drop Both Eyes QHS  . chlorhexidine  15 mL Mouth Rinse BID  . furosemide  60 mg Intravenous BID  . insulin  aspart  2-10 Units Subcutaneous Q4H  . levofloxacin (LEVAQUIN) IV  750 mg Intravenous Q48H  . Levothyroxine Sodium  100 mcg Intravenous Q24H  . nystatin   Topical BID  . pantoprazole (PROTONIX) IV  40 mg Intravenous Daily  . sodium chloride  10-40 mL Intracatheter Q12H  . vancomycin  1,000 mg Intravenous Q24H   Continuous: . DOPamine Stopped (04/20/13 1005)  . Marland KitchenTPN (CLINIMIX-E) Adult 40 mL/hr at 04/23/13 0515   And  . fat emulsion 250 mL (04/23/13 0515)  . sodium chloride 0.45 % 1,000 mL with potassium chloride 40 mEq infusion 60 mL/hr at 04/23/13 0515   XBJ:[id number], midazolam, morphine injection, ondansetron (ZOFRAN) IV, sodium chloride  Assesment: She has acute on chronic respiratory failure. I suspect she has sleep apnea. She is malnourished with albumin of 2.3 but is receiving parenteral nutrition. Her hemoglobin levels 8.1. I discussed with her the fact that we want her to come off the ventilator but we are concerned about whether she will be able to stay off the ventilator and she understands. She has been diuresing but she had more volume given when she was having problems with hemorrhagic shock we might be able to make some temporary improvement with albumin and I will discuss that with the hospitalist team Active Problems:   DIABETES MELLITUS,  TYPE II   HYPERLIPIDEMIA   Morbid obesity with BMI of 40.0-44.9, adult   HYPERTENSION   FIBRILLATION, ATRIAL   Long term current use of anticoagulant   CHF exacerbation   Acute respiratory failure   Acute renal failure   Acute diastolic heart failure   Hematoma of groin   Pseudoaneurysm   Acute blood loss anemia   Hemorrhagic shock    Plan: Continue efforts. She's weaning now and see how she does.    LOS: 9 days   Amber Armstrong 04/23/2013, 7:38 AM

## 2013-04-23 NOTE — Progress Notes (Signed)
3 Days Post-Op  Subjective: No acute change with thigh.  No increased pain.  NO distal pain.    Objective: Vital signs in last 24 hours: Temp:  [97.8 F (36.6 C)-98.9 F (37.2 C)] 98.9 F (37.2 C) (07/01 1600) Pulse Rate:  [83-157] 106 (07/01 1500) Resp:  [14-23] 20 (07/01 1500) BP: (105)/(95) 105/95 mmHg (07/01 0700) SpO2:  [97 %-100 %] 98 % (07/01 1955) Arterial Line BP: (73-146)/(58-126) 98/77 mmHg (07/01 1500) FiO2 (%):  [39.9 %-40.4 %] 40.1 % (07/01 0800) Weight:  [99.3 kg (218 lb 14.7 oz)] 99.3 kg (218 lb 14.7 oz) (07/01 0503) Last BM Date: 04/14/13  Intake/Output from previous day: 06/30 0701 - 07/01 0700 In: 2670.4 [I.V.:1864.6; IV Piggyback:200; TPN:605.8] Out: 4475 [Urine:4475] Intake/Output this shift: Total I/O In: -  Out: 1200 [Urine:1200]  Extremities: warm.  Stable ecchymosis.  No increase in hematoma.  Incision c/d/i.  Lab Results:   Recent Labs  04/23/13 1034 04/23/13 1742  WBC 15.0* 15.4*  HGB 8.8* 8.9*  HCT 27.3* 28.2*  PLT 118* 127*   BMET  Recent Labs  04/23/13 0451 04/23/13 1035  NA 139 138  K 3.4* 3.2*  CL 95* 93*  CO2 41* 40*  GLUCOSE 168* 182*  BUN 29* 29*  CREATININE 1.50* 1.47*  CALCIUM 8.2* 8.4   PT/INR  Recent Labs  04/22/13 0444 04/23/13 0451  LABPROT 14.9 15.5*  INR 1.20 1.26   ABG  Recent Labs  04/22/13 0910 04/23/13 0805  PHART 7.405 7.469*  HCO3 38.3* 38.4*    Studies/Results: Dg Chest Port 1 View  04/23/2013   *RADIOLOGY REPORT*  Clinical Data: Respiratory failure  PORTABLE CHEST - 1 VIEW  Comparison: 04/22/2013  Findings: Left PICC line in unchanged position.  The patient has been extubated.  Cardiac silhouette is mildly to moderately enlarged.  There is mild central vascular congestion.  There is mild diffuse interstitial prominence.  There is a left lower lobe retrocardiac opacity which appears stable.  There is opacity in the medial right middle lobe, also unchanged.  IMPRESSION: Cardiac enlargement  with vascular congestion and bilateral infiltrates suggesting pulmonary edema in the setting of congestive heart failure.  No significant change status post extubation.   Original Report Authenticated By: Esperanza Heir, M.D.   Dg Chest Port 1 View  04/22/2013   *RADIOLOGY REPORT*  Clinical Data: Line placement  PORTABLE CHEST - 1 VIEW  Comparison: Portable exam 1247 hours compared to 04/22/2013  Findings: Tip of the left arm PICC line projects over SVC near cavoatrial junction. Enlargement of cardiac silhouette with pulmonary vascular congestion. Diffuse infiltrates most consistent with pulmonary edema and CHF. Tip of endotracheal tube in satisfactory position above carina. Probable small left pleural effusion. No pneumothorax.  IMPRESSION: Persistent CHF. Tip of left arm PICC line projects over SVC near the cavoatrial junction.   Original Report Authenticated By: Ulyses Southward, M.D.   Dg Chest Port 1 View  04/22/2013   *RADIOLOGY REPORT*  Clinical Data: Ventilated patient.  Ventilator protocol.  Follow-up radiograph.  Assess support apparatus.  PORTABLE CHEST - 1 VIEW  Comparison: 04/21/2013, 0845 hours.  Findings: The cardiopericardial silhouette is partially obscured but appears enlarged.  Endotracheal tube tip 31 mm from carina. Left upper extremity PICC is present with the tip at the cavoatrial junction.  Low volume chest.  Shifting airspace disease is present compatible with pulmonary edema.  Underlying pneumonia cannot be excluded.  The layering of a pleural effusions.  IMPRESSION:  1.  Stable support  apparatus. 2.  Shifting bilateral airspace disease most compatible with pulmonary edema.  Superimposed basilar atelectasis and shifting pleural effusions.   Original Report Authenticated By: Andreas Newport, M.D.    Anti-infectives: Anti-infectives   Start     Dose/Rate Route Frequency Ordered Stop   04/21/13 0600  vancomycin (VANCOCIN) IVPB 1000 mg/200 mL premix     1,000 mg 200 mL/hr over 60 Minutes  Intravenous Every 24 hours 04/20/13 1012     04/17/13 1000  vancomycin (VANCOCIN) 1,500 mg in sodium chloride 0.9 % 500 mL IVPB  Status:  Discontinued     1,500 mg 250 mL/hr over 120 Minutes Intravenous Every 24 hours 04/17/13 0807 04/20/13 1012   04/17/13 0900  levofloxacin (LEVAQUIN) IVPB 750 mg     750 mg 100 mL/hr over 90 Minutes Intravenous Every 48 hours 04/17/13 0807     04/17/13 0800  levofloxacin (LEVAQUIN) IVPB 500 mg  Status:  Discontinued     500 mg 100 mL/hr over 60 Minutes Intravenous Every 24 hours 04/17/13 0747 04/17/13 0807      Assessment/Plan: s/p Procedure(s): GROIN EXPLORATION (Right) Continue to monitor.  No acute surgical findings.  following.   LOS: 9 days    Amber Armstrong C 04/23/2013

## 2013-04-23 NOTE — Procedures (Signed)
Extubation Procedure Note  Patient Details:   Name: Amber Armstrong DOB: Aug 29, 1951 MRN: [id number]   Airway Documentation:  Airway 7.5 mm (Active)  Secured at (cm) 20 cm 04/23/2013  7:00 AM  Measured From Lips 04/23/2013  7:00 AM  Secured Location Left 04/23/2013  7:00 AM  Secured By [employer] 04/23/2013  7:00 AM  Tube Holder Repositioned Yes 04/23/2013  7:00 AM  Cuff Pressure (cm H2O) 20 cm H2O 04/23/2013  5:03 AM  Site Condition Dry 04/23/2013  7:00 AM    Evaluation  O2 sats: stable throughout Complications: No apparent complications Patient did tolerate procedure well. Bilateral Breath Sounds: Diminished Suctioning: Oral;Airway Yes  Katheren Shams 04/23/2013, 9:02 AM

## 2013-04-23 NOTE — Progress Notes (Signed)
Amber Armstrong ZOX:[id number] DOB: Oct 26, 1950 DOA: 04/14/2013   Called to see this lady at 7:50 PM 04/23/2013, in regard to adjustment of insulin schedule   Body mass index is 42.75 kg/(m^2). Motivation and readiness for weight loss intervention . Eager to discuss her actions for losing weight over the past 8 years, which is essentially walking around [place] parking lot every day. However she reports she has not lost any weight using this technique; she is not addressed issues of diet nor portion control. As she says she cannot really address weight loss because she herself is so sick and when she is not sick she has to look after her elderly mother, without any other help. She does not seem to be strongly motivated to lose weight, and does not appear to be in the frame of mind to discuss more effective measures at this time. She side steps to offer of referral to nutritionist.  This issue should be readdressed when she is more stable. Consider referral to a nutritionist at discharge.  Pooja Camuso 04/23/2013 8:48 PM

## 2013-04-23 NOTE — Progress Notes (Signed)
Utilization Review Complete  

## 2013-04-23 NOTE — Evaluation (Signed)
Physical Therapy Evaluation Patient Details Name: Amber Armstrong MRN: [id number] DOB: Jun 05, 1951 Today's Date: 04/23/2013 Time: 1357-1440 PT Time Calculation (min): 43 min  PT Assessment / Plan / Recommendation History of Present Illness   Pt is employed full time as a Conservation officer, nature at [employer].  She is now in ICU for tx of respiratory failure due to COPD.  She came off of ventilator today.  She is not speaking much because of throat soreness from trach tub  Clinical Impression   Pt was seen today for initial evaluation/tx.  She is alert and oriented, very cooperative.  Her sister states that this is pt's 1st time in the hospital as a pt.  She is on 2 L O2 with O2 sats=99%.  Her right groin has a large bandage from previous femoral line that bled out and this area is very sore. Pt is severely deconditioned due to recent illness and is not yet ambulatory.  She was able to transfer bed to chair with min assistance and very close supervision as she was not really able to maintain stance during pivot.  She will need SNF at d/c in order to increase overall strength and mobility.    PT Assessment  Patient needs continued PT services    Follow Up Recommendations  SNF    Does the patient have the potential to tolerate intense rehabilitation    no  Barriers to Discharge Decreased caregiver support;Inaccessible home environment 3 steps into home, mother unable to care for pt    Equipment Recommendations  Rolling walker with 5" wheels    Recommendations for Other Services OT consult   Frequency Min 3X/week    Precautions / Restrictions Precautions Precautions: Fall Restrictions Weight Bearing Restrictions: No Other Position/Activity Restrictions: pt is hooked up to multiple IV lines...also had a femoral line in right groin that bled out when removed.  This area is very edemetous and sore with movement of right leg.   Pertinent Vitals/Pain       Mobility  Bed Mobility Bed Mobility: Supine to  Sit;Sitting - Scoot to Edge of Bed Supine to Sit: 3: Mod assist;HOB elevated Sitting - Scoot to Edge of Bed: 3: Mod assist Details for Bed Mobility Assistance: pt has difficulty moving due to right groin soreness Transfers Transfers: Editor, commissioning Transfers: 4: Min assist Details for Transfer Assistance: pt had difficutly maintaining stance during transfer and needed very close guarding/assist from therapist Ambulation/Gait Ambulation/Gait Assistance: Not tested (comment)    Exercises     PT Diagnosis: Difficulty walking;Generalized weakness;Acute pain  PT Problem List: Decreased strength;Decreased activity tolerance;Decreased mobility;Decreased knowledge of use of DME;Decreased safety awareness;Cardiopulmonary status limiting activity;Pain PT Treatment Interventions: Gait training;Functional mobility training;Therapeutic activities;Therapeutic exercise;Patient/family education     PT Goals(Current goals can be found in the care plan section) Acute Rehab PT Goals Patient Stated Goal: return home, work PT Goal Formulation: With patient Time For Goal Achievement: 05/07/13 Potential to Achieve Goals: Good  Visit Information  Last PT Received On: 04/23/13       Prior Functioning  Home Living Family/patient expects to be discharged to:: Private residence Living Arrangements: Parent (mother is 46 years old, very frail) Available Help at Discharge: Family;Available PRN/intermittently Type of Home: House Home Access: Stairs to enter [employer] of Steps: 3 Entrance Stairs-Rails: Left Home Layout: One level Home Equipment: Walker - 2 wheels;Cane - single point Prior Function Level of Independence: Independent Comments: works as a Conservation officer, nature at [employer]: Expressive difficulties (  unable to speak due to throat soreness from trach)    Cognition  Cognition Arousal/Alertness: Awake/alert Behavior During Therapy: WFL for tasks  assessed/performed Overall Cognitive Status: Within Functional Limits for tasks assessed    Extremity/Trunk Assessment Upper Extremity Assessment Upper Extremity Assessment: Defer to OT evaluation Lower Extremity Assessment Lower Extremity Assessment: Generalized weakness Cervical / Trunk Assessment Cervical / Trunk Assessment: Normal   Balance Balance Balance Assessed: Yes Static Sitting Balance Static Sitting - Balance Support: No upper extremity supported;Feet supported Static Sitting - Level of Assistance: 7: Independent Dynamic Sitting Balance Dynamic Sitting - Balance Support: No upper extremity supported;Feet supported;During functional activity Dynamic Sitting - Level of Assistance: 5: Stand by assistance  End of Session PT - End of Session Equipment Utilized During Treatment: Gait belt Activity Tolerance: Patient tolerated treatment well Patient left: in chair;with call bell/phone within reach;with family/visitor present Nurse Communication: Mobility status  GP     Konrad Penta 04/23/2013, 2:56 PM

## 2013-04-23 NOTE — Progress Notes (Signed)
Subjective: Interval History: Patient presently alert and awake . She is signaling to me she wants to be extubated. Seems stable  Objective: Vital signs in last 24 hours: Temp:  [97.4 F (36.3 C)-97.9 F (36.6 C)] 97.9 F (36.6 C) (07/01 0740) Pulse Rate:  [92-110] 100 (07/01 0700) Resp:  [13-26] 18 (07/01 0700) BP: (105)/(95) 105/95 mmHg (07/01 0700) SpO2:  [98 %-100 %] 100 % (07/01 0500) Arterial Line BP: (62-149)/(49-95) 104/84 mmHg (07/01 0500) FiO2 (%):  [39.7 %-40.5 %] 40 % (07/01 0700) Weight:  [99.3 kg (218 lb 14.7 oz)] 99.3 kg (218 lb 14.7 oz) (07/01 0503) Weight change: 1.3 kg (2 lb 13.9 oz)  Intake/Output from previous day: 06/30 0701 - 07/01 0700 In: 2565.4 [I.V.:1759.6; IV Piggyback:200; TPN:605.8] Out: 4475 [Urine:4475] Intake/Output this shift:    Generally patient is awake and alert. Does not seem to be in any apparent distress. Remained intubated Chest decreased breath sound bilaterally and anteriorly. She has also some inspiratory crackles bilaterally. Heart irregular rate and rhythm no murmur. Her heart rate is controlled Abdomen soft positive bowel sound Extremities she has 1+ lower extremity edema however she has significant upper extremity edema.   Lab Results:  Recent Labs  04/23/13 0120 04/23/13 0451  WBC 14.1* 13.2*  HGB 8.4* 8.1*  HCT 25.8* 24.6*  PLT 104* 105*   BMET:   Recent Labs  04/22/13 0444 04/23/13 0451  NA 142 139  K 3.0* 3.4*  CL 95* 95*  CO2 39* 41*  GLUCOSE 116* 168*  BUN 33* 29*  CREATININE 1.74* 1.50*  CALCIUM 8.1* 8.2*   No results found for this basename: PTH,  in the last 72 hours Iron Studies: No results found for this basename: IRON, TIBC, TRANSFERRIN, FERRITIN,  in the last 72 hours  Studies/Results: Dg Chest Port 1 View  04/22/2013   *RADIOLOGY REPORT*  Clinical Data: Line placement  PORTABLE CHEST - 1 VIEW  Comparison: Portable exam 1247 hours compared to 04/22/2013  Findings: Tip of the left arm PICC line  projects over SVC near cavoatrial junction. Enlargement of cardiac silhouette with pulmonary vascular congestion. Diffuse infiltrates most consistent with pulmonary edema and CHF. Tip of endotracheal tube in satisfactory position above carina. Probable small left pleural effusion. No pneumothorax.  IMPRESSION: Persistent CHF. Tip of left arm PICC line projects over SVC near the cavoatrial junction.   Original Report Authenticated By: Ulyses Southward, M.D.   Dg Chest Port 1 View  04/22/2013   *RADIOLOGY REPORT*  Clinical Data: Ventilated patient.  Ventilator protocol.  Follow-up radiograph.  Assess support apparatus.  PORTABLE CHEST - 1 VIEW  Comparison: 04/21/2013, 0845 hours.  Findings: The cardiopericardial silhouette is partially obscured but appears enlarged.  Endotracheal tube tip 31 mm from carina. Left upper extremity PICC is present with the tip at the cavoatrial junction.  Low volume chest.  Shifting airspace disease is present compatible with pulmonary edema.  Underlying pneumonia cannot be excluded.  The layering of a pleural effusions.  IMPRESSION:  1.  Stable support apparatus. 2.  Shifting bilateral airspace disease most compatible with pulmonary edema.  Superimposed basilar atelectasis and shifting pleural effusions.   Original Report Authenticated By: Andreas Newport, M.D.   Dg Chest Port 1 View  04/21/2013   *RADIOLOGY REPORT*  Clinical Data:  Respiratory failure.  PORTABLE CHEST - 1 VIEW  Comparison: 04/20/2013.  None.  Findings: The endotracheal tube and left PICC line are unchanged in position.  Heart size remains enlarged.  Edema throughout the  right lung is stable. There is moderate worsening of edema throughout the left lung.  There are no other changes.  There are no effusions or pneumothoraces.  There are no acute bony changes.  IMPRESSION: Worsening edema throughout the left lung compared to the preceding study.   Original Report Authenticated By: Sander Radon, M.D.    I have  reviewed the patient's current medications.  Assessment/Plan: Problem #1 acute kidney injury superimposed on chronic her BUN and creatinine presently is improving. BUN is 29 and creatine is 1.5. Patient is none oliguric.  Problem #2 CHF patient off lasix because of hypotension. CHF seemsvimproving. Patient on lasix iv with good urine out put. She is negative 1800 cc last 24 hours Problem #3 hypokalemia related most likely to her diuretics. Her potassium is has low but improving. Presently on pottassuim supplemnet Problem #4 diabetes Problem #5 a trial fibrillation her heart rate is controlled Problem #6 respiratory failure possibly combination of CHF and also pneumonia. Patient is presently remains intubated.  Problem #7 hyperlipidemia Problem #8 obesity Problem #9 hypertension presently patient blood pressure seems to be low normal. Possibly from bleeding.  Plan continue with her present treatment We'll check her basic metabolic panel in the morning.     LOS: 9 days   Kadar Chance S 04/23/2013,8:23 AM

## 2013-04-24 ENCOUNTER — Inpatient Hospital Stay (HOSPITAL_COMMUNITY): Payer: BC Managed Care – PPO

## 2013-04-24 DIAGNOSIS — I729 Aneurysm of unspecified site: Secondary | ICD-10-CM

## 2013-04-24 LAB — BASIC METABOLIC PANEL
BUN: 28 mg/dL — ABNORMAL HIGH (ref 6–23)
BUN: 28 mg/dL — ABNORMAL HIGH (ref 6–23)
Chloride: 92 mEq/L — ABNORMAL LOW (ref 96–112)
Chloride: 91 mEq/L — ABNORMAL LOW (ref 96–112)
Creatinine, Ser: 1.4 mg/dL — ABNORMAL HIGH (ref 0.50–1.10)
GFR calc Af Amer: 45 mL/min — ABNORMAL LOW (ref >90)
GFR calc Af Amer: 45 mL/min — ABNORMAL LOW (ref >90)
GFR calc non Af Amer: 39 mL/min — ABNORMAL LOW (ref >90)
GFR calc non Af Amer: 39 mL/min — ABNORMAL LOW (ref >90)
Potassium: 3.5 mEq/L (ref 3.5–5.1)
Sodium: 138 mEq/L (ref 135–145)

## 2013-04-24 LAB — GLUCOSE, CAPILLARY
Glucose-Capillary: 195 mg/dL — ABNORMAL HIGH (ref 70–99)
Glucose-Capillary: 189 mg/dL — ABNORMAL HIGH (ref 70–99)

## 2013-04-24 LAB — CBC
HCT: 27.9 % — ABNORMAL LOW (ref 36.0–46.0)
Hemoglobin: 8.2 g/dL — ABNORMAL LOW (ref 12.0–15.0)
Hemoglobin: 8 g/dL — ABNORMAL LOW (ref 12.0–15.0)
MCH: 29.7 pg (ref 26.0–34.0)
MCHC: 29.4 g/dL — ABNORMAL LOW (ref 30.0–36.0)
MCHC: 27.9 g/dL — ABNORMAL LOW (ref 30.0–36.0)
MCV: 101.8 fL — ABNORMAL HIGH (ref 78.0–100.0)
Platelets: 118 10*3/uL — ABNORMAL LOW (ref 150–400)
Platelets: 144 10*3/uL — ABNORMAL LOW (ref 150–400)
RBC: 3.17 MIL/uL — ABNORMAL LOW (ref 3.87–5.11)
RDW: 19.2 % — ABNORMAL HIGH (ref 11.5–15.5)
RDW: 19.3 % — ABNORMAL HIGH (ref 11.5–15.5)
RDW: 18.6 % — ABNORMAL HIGH (ref 11.5–15.5)
WBC: 14.3 10*3/uL — ABNORMAL HIGH (ref 4.0–10.5)

## 2013-04-24 LAB — BLOOD GAS, ARTERIAL
Bicarbonate: 41.3 mEq/L — ABNORMAL HIGH (ref 20.0–24.0)
O2 Content: 3.5 L/min
O2 Saturation: 95.8 %
Patient temperature: 37
pH, Arterial: 7.462 — ABNORMAL HIGH (ref 7.350–7.450)
pO2, Arterial: 77.7 mmHg — ABNORMAL LOW (ref 80.0–100.0)

## 2013-04-24 LAB — PROTIME-INR: INR: 1.26 (ref 0.00–1.49)

## 2013-04-24 LAB — VANCOMYCIN, TROUGH: Vancomycin Tr: 21.3 ug/mL — ABNORMAL HIGH (ref 10.0–20.0)

## 2013-04-24 MED ORDER — INSULIN ASPART 100 UNIT/ML ~~LOC~~ SOLN
2.0000 [IU] | SUBCUTANEOUS | Status: DC
  Administered 2013-04-24: 4 [IU] via SUBCUTANEOUS

## 2013-04-24 MED ORDER — FUROSEMIDE 40 MG PO TABS
60.0000 mg | ORAL_TABLET | Freq: Two times a day (BID) | ORAL | Status: DC
  Administered 2013-04-24 – 2013-04-25 (×2): 60 mg via ORAL
  Filled 2013-04-24: qty 2
  Filled 2013-04-24: qty 1

## 2013-04-24 MED ORDER — FAT EMULSION 20 % IV EMUL
250.0000 mL | INTRAVENOUS | Status: DC
  Filled 2013-04-24: qty 250

## 2013-04-24 MED ORDER — ADULT MULTIVITAMIN W/MINERALS CH
1.0000 | ORAL_TABLET | Freq: Every day | ORAL | Status: DC
  Administered 2013-04-25 – 2013-04-29 (×5): 1 via ORAL
  Filled 2013-04-24 (×5): qty 1

## 2013-04-24 MED ORDER — VANCOMYCIN HCL IN DEXTROSE 750-5 MG/150ML-% IV SOLN
750.0000 mg | INTRAVENOUS | Status: DC
  Administered 2013-04-24 – 2013-04-26 (×3): 750 mg via INTRAVENOUS
  Filled 2013-04-24 (×5): qty 150

## 2013-04-24 MED ORDER — INSULIN ASPART 100 UNIT/ML ~~LOC~~ SOLN
0.0000 [IU] | Freq: Three times a day (TID) | SUBCUTANEOUS | Status: DC
  Administered 2013-04-25: 3 [IU] via SUBCUTANEOUS
  Administered 2013-04-25: 5 [IU] via SUBCUTANEOUS
  Administered 2013-04-25: 2 [IU] via SUBCUTANEOUS
  Administered 2013-04-27: 3 [IU] via SUBCUTANEOUS
  Administered 2013-04-27 – 2013-04-28 (×2): 2 [IU] via SUBCUTANEOUS
  Administered 2013-04-29: 3 [IU] via SUBCUTANEOUS

## 2013-04-24 MED ORDER — INSULIN ASPART 100 UNIT/ML ~~LOC~~ SOLN
0.0000 [IU] | Freq: Three times a day (TID) | SUBCUTANEOUS | Status: DC
  Administered 2013-04-24: 5 [IU] via SUBCUTANEOUS
  Administered 2013-04-26: 3 [IU] via SUBCUTANEOUS
  Administered 2013-04-26 (×2): 2 [IU] via SUBCUTANEOUS
  Administered 2013-04-27 – 2013-04-29 (×4): 3 [IU] via SUBCUTANEOUS

## 2013-04-24 MED ORDER — INSULIN ASPART 100 UNIT/ML ~~LOC~~ SOLN: 0.0000 [IU] | Freq: Every day | SUBCUTANEOUS | Status: DC

## 2013-04-24 MED ORDER — TRACE MINERALS CR-CU-F-FE-I-MN-MO-SE-ZN IV SOLN
INTRAVENOUS | Status: DC
  Filled 2013-04-24: qty 2000

## 2013-04-24 MED ORDER — GLUCERNA SHAKE PO LIQD
237.0000 mL | Freq: Two times a day (BID) | ORAL | Status: DC
  Administered 2013-04-24 – 2013-04-29 (×9): 237 mL via ORAL

## 2013-04-24 NOTE — Progress Notes (Signed)
Subjective: She was able to be extubated yesterday and has done well since her extubation. She is awake and alert and says she feels well.  Objective: Vital signs in last 24 hours: Temp:  [97.8 F (36.6 C)-98.9 F (37.2 C)] 97.8 F (36.6 C) (07/02 0511) Pulse Rate:  [106-122] 107 (07/02 0511) Resp:  [20-23] 22 (07/02 0511) BP: (124-134)/(85-90) 124/90 mmHg (07/02 0511) SpO2:  [96 %-99 %] 99 % (07/02 0511) Arterial Line BP: (73-98)/(58-78) 98/77 mmHg (07/01 1500) Weight:  [95.4 kg (210 lb 5.1 oz)-96.798 kg (213 lb 6.4 oz)] 95.4 kg (210 lb 5.1 oz) (07/02 0457) Weight change: -2.502 kg (-5 lb 8.3 oz) Last BM Date: 04/14/13  Intake/Output from previous day: 07/01 0701 - 07/02 0700 In: 2032.3 [I.V.:960; IV Piggyback:403.3; TPN:669] Out: 4200 [Urine:4200]  PHYSICAL EXAM General appearance: alert, cooperative and no distress Resp: rhonchi bilaterally Cardio: irregularly irregular rhythm GI: soft, non-tender; bowel sounds normal; no masses,  no organomegaly Extremities: extremities normal, atraumatic, no cyanosis or edema  Lab Results:    Basic Metabolic Panel:  Recent Labs  16/10/96 0451 04/23/13 1035 04/24/13 0655  NA 139 138 139  K 3.4* 3.2* 3.5  CL 95* 93* 92*  CO2 41* 40* 43*  GLUCOSE 168* 182* 190*  BUN 29* 29* 28*  CREATININE 1.50* 1.47* 1.40*  CALCIUM 8.2* 8.4 8.7  MG 1.7  --   --   PHOS 2.0*  --   --    Liver Function Tests:  Recent Labs  04/23/13 0451  AST 18  ALT 10  ALKPHOS 67  BILITOT 1.2  PROT 5.3*  ALBUMIN 2.3*   No results found for this basename: LIPASE, AMYLASE,  in the last 72 hours No results found for this basename: AMMONIA,  in the last 72 hours CBC:  Recent Labs  04/23/13 0451  04/24/13 0125 04/24/13 0456  WBC 13.2*  < > 12.9* 12.6*  NEUTROABS 9.7*  --   --   --   HGB 8.1*  < > 8.2* 8.0*  HCT 24.6*  < > 27.9* 28.7*  MCV 89.8  < > 101.8* 106.7*  PLT 105*  < > 114* 118*  < > = values in this interval not displayed. Cardiac  Enzymes: No results found for this basename: CKTOTAL, CKMB, CKMBINDEX, TROPONINI,  in the last 72 hours BNP: No results found for this basename: PROBNP,  in the last 72 hours D-Dimer: No results found for this basename: DDIMER,  in the last 72 hours CBG:  Recent Labs  04/23/13 0400 04/23/13 0728 04/23/13 1131 04/23/13 1631 04/23/13 2139 04/24/13 0729  GLUCAP 130* 158* 161* 210* 144* 195*   Hemoglobin A1C: No results found for this basename: HGBA1C,  in the last 72 hours Fasting Lipid Panel:  Recent Labs  04/23/13 0451  TRIG 96   Thyroid Function Tests: No results found for this basename: TSH, T4TOTAL, FREET4, T3FREE, THYROIDAB,  in the last 72 hours Anemia Panel: No results found for this basename: VITAMINB12, FOLATE, FERRITIN, TIBC, IRON, RETICCTPCT,  in the last 72 hours Coagulation:  Recent Labs  04/23/13 0451 04/24/13 0456  LABPROT 15.5* 15.5*  INR 1.26 1.26   Urine Drug Screen: Drugs of Abuse  No results found for this basename: labopia, cocainscrnur, labbenz, amphetmu, thcu, labbarb    Alcohol Level: No results found for this basename: ETH,  in the last 72 hours Urinalysis: No results found for this basename: COLORURINE, APPERANCEUR, LABSPEC, PHURINE, GLUCOSEU, HGBUR, BILIRUBINUR, KETONESUR, PROTEINUR, UROBILINOGEN, NITRITE, LEUKOCYTESUR,  in the last 72 hours Misc. Labs:  ABGS  Recent Labs  04/24/13 0855  PHART 7.462*  PO2ART 77.7*  TCO2 37.0  HCO3 41.3*   CULTURES Recent Results (from the past 240 hour(s))  MRSA PCR SCREENING     Status: None   Collection Time    04/17/13  6:18 AM      Result Value Range Status   MRSA by PCR NEGATIVE  NEGATIVE Final   Comment:            The GeneXpert MRSA Assay (FDA     approved for NASAL specimens     only), is one component of a     comprehensive MRSA colonization     surveillance program. It is not     intended to diagnose MRSA     infection nor to guide or     monitor treatment for     MRSA  infections.  URINE CULTURE     Status: None   Collection Time    04/17/13 11:40 AM      Result Value Range Status   Specimen Description URINE, CLEAN CATCH   Final   Special Requests NONE   Final   Culture  Setup Time 04/17/2013 12:20   Final   Colony Count NO GROWTH   Final   Culture NO GROWTH   Final   Report Status 04/18/2013 FINAL   Final   Studies/Results: Dg Chest Port 1 View  04/24/2013   *RADIOLOGY REPORT*  Clinical Data: Respiratory failure, follow-up  PORTABLE CHEST - 1 VIEW  Comparison: Portable chest x-ray of 04/23/2013  Findings: The lungs appears slightly better aerated.  There is pulmonary vascular congestion remaining,  possibly slightly increased, with basilar atelectasis and/or small effusions. Cardiomegaly is stable.  Left PICC line remains.  IMPRESSION: Minimal improvement in aeration.  Slight worsening of pulmonary vascular congestion with basilar opacities consistent with atelectasis and/or effusion.   Original Report Authenticated By: Dwyane Dee, M.D.   Dg Chest Port 1 View  04/23/2013   *RADIOLOGY REPORT*  Clinical Data: Respiratory failure  PORTABLE CHEST - 1 VIEW  Comparison: 04/22/2013  Findings: Left PICC line in unchanged position.  The patient has been extubated.  Cardiac silhouette is mildly to moderately enlarged.  There is mild central vascular congestion.  There is mild diffuse interstitial prominence.  There is a left lower lobe retrocardiac opacity which appears stable.  There is opacity in the medial right middle lobe, also unchanged.  IMPRESSION: Cardiac enlargement with vascular congestion and bilateral infiltrates suggesting pulmonary edema in the setting of congestive heart failure.  No significant change status post extubation.   Original Report Authenticated By: Esperanza Heir, M.D.   Dg Chest Port 1 View  04/22/2013   *RADIOLOGY REPORT*  Clinical Data: Line placement  PORTABLE CHEST - 1 VIEW  Comparison: Portable exam 1247 hours compared to 04/22/2013   Findings: Tip of the left arm PICC line projects over SVC near cavoatrial junction. Enlargement of cardiac silhouette with pulmonary vascular congestion. Diffuse infiltrates most consistent with pulmonary edema and CHF. Tip of endotracheal tube in satisfactory position above carina. Probable small left pleural effusion. No pneumothorax.  IMPRESSION: Persistent CHF. Tip of left arm PICC line projects over SVC near the cavoatrial junction.   Original Report Authenticated By: Ulyses Southward, M.D.    Medications:  Prior to Admission:  Prescriptions prior to admission  Medication Sig Dispense Refill  . furosemide (LASIX) 20 MG tablet Take 20 mg by mouth  2 (two) times daily.      Marland Kitchen glimepiride (AMARYL) 2 MG tablet Take 2 mg by mouth daily before breakfast.      . insulin glargine (LANTUS) 100 UNIT/ML injection Inject 30 Units into the skin at bedtime.       Marland Kitchen levothyroxine (SYNTHROID, LEVOTHROID) 200 MCG tablet Take 200 mcg by mouth daily before breakfast.      . metoprolol (TOPROL-XL) 200 MG 24 hr tablet TAKE ONE TABLET BY MOUTH EVERY DAY  30 tablet  0  . TAZTIA XT 360 MG 24 hr capsule TAKE ONE CAPSULE BY MOUTH EVERY DAY  90 capsule  0  . warfarin (COUMADIN) 2.5 MG tablet Take 2.5-5 mg by mouth See admin instructions. Take 1 tablet daily except for Wednesday pt takes 2 tablets      . bimatoprost (LUMIGAN) 0.03 % ophthalmic solution Place 1 drop into both eyes at bedtime.       Scheduled: . antiseptic oral rinse  15 mL Mouth Rinse QID  . bimatoprost  1 drop Both Eyes QHS  . furosemide  60 mg Intravenous BID  . insulin aspart  0-15 Units Subcutaneous TID WC  . insulin aspart  0-5 Units Subcutaneous QHS  . levofloxacin (LEVAQUIN) IV  750 mg Intravenous Q48H  . Levothyroxine Sodium  100 mcg Intravenous Q24H  . nystatin   Topical BID  . pantoprazole (PROTONIX) IV  40 mg Intravenous Daily  . sodium chloride  10-40 mL Intracatheter Q12H  . vancomycin  750 mg Intravenous Q24H   Continuous: . Marland KitchenTPN  (CLINIMIX-E) Adult 60 mL/hr at 04/23/13 1815  . DOPamine Stopped (04/20/13 1005)  . sodium chloride 0.45 % 1,000 mL with potassium chloride 40 mEq infusion     ZOX:[id number], midazolam, morphine injection, ondansetron (ZOFRAN) IV, sodium chloride  Assesment: She came in with acute on chronic respiratory failure. Some of this at least is related to sleep apnea/obesity hypoventilation I believe. She is much improved now. She had exacerbation of CHF as well. Active Problems:   DIABETES MELLITUS, TYPE II   HYPERLIPIDEMIA   Morbid obesity with BMI of 40.0-44.9, adult   HYPERTENSION   FIBRILLATION, ATRIAL   Long term current use of anticoagulant   CHF exacerbation   Acute respiratory failure   Acute renal failure   Acute diastolic heart failure   Hematoma of groin   Pseudoaneurysm   Acute blood loss anemia   Hemorrhagic shock    Plan: Continue current treatments. I believe plans are for her to go to a skilled care facility at discharge    LOS: 10 days   Falicia Lizotte L 04/24/2013, 9:11 AM

## 2013-04-24 NOTE — Progress Notes (Signed)
CRITICAL VALUE ALERT  Critical value received:  CO2 43   Date of notification:  04/24/2013   Time of notification:  0750  Critical value read back:yes  Nurse who received alert: Arloa Koh, RN   MD notified (1st page):  Hos  Time of first page:  [phone]  MD notified (2nd page):  Time of second page: [phone]  Responding MD:  Stevphen Meuse  Time MD responded:  [phone]

## 2013-04-24 NOTE — Progress Notes (Signed)
ANTIBIOTIC CONSULT NOTE   Pharmacy Consult for Vancomycin Indication: pneumonia  Allergies  Allergen Reactions  . Penicillins Rash   Patient Measurements: Height: 5' (152.4 cm) Weight: 210 lb 5.1 oz (95.4 kg) IBW/kg (Calculated) : 45.5  Vital Signs: Temp: 97.8 F (36.6 C) (07/02 0511) Temp src: Oral (07/02 0511) BP: 124/90 mmHg (07/02 0511) Pulse Rate: 107 (07/02 0511) Intake/Output from previous day: 07/01 0701 - 07/02 0700 In: 2032.3 [I.V.:960; IV Piggyback:403.3; TPN:669] Out: 4200 [Urine:4200] Intake/Output from this shift:    Labs:  Recent Labs  04/23/13 0451  04/23/13 1035 04/23/13 1742 04/24/13 0125 04/24/13 0456 04/24/13 0655  WBC 13.2*  < >  --  15.4* 12.9* 12.6*  --   HGB 8.1*  < >  --  8.9* 8.2* 8.0*  --   PLT 105*  < >  --  127* 114* 118*  --   CREATININE 1.50*  --  1.47*  --   --   --  1.40*  < > = values in this interval not displayed. Estimated Creatinine Clearance: 42.5 ml/min (by C-G formula based on Cr of 1.4).  Recent Labs  04/24/13 0456  VANCOTROUGH 21.3*    Microbiology: Recent Results (from the past 720 hour(s))  MRSA PCR SCREENING     Status: None   Collection Time    04/17/13  6:18 AM      Result Value Range Status   MRSA by PCR NEGATIVE  NEGATIVE Final   Comment:            The GeneXpert MRSA Assay (FDA     approved for NASAL specimens     only), is one component of a     comprehensive MRSA colonization     surveillance program. It is not     intended to diagnose MRSA     infection nor to guide or     monitor treatment for     MRSA infections.  URINE CULTURE     Status: None   Collection Time    04/17/13 11:40 AM      Result Value Range Status   Specimen Description URINE, CLEAN CATCH   Final   Special Requests NONE   Final   Culture  Setup Time 04/17/2013 12:20   Final   Colony Count NO GROWTH   Final   Culture NO GROWTH   Final   Report Status 04/18/2013 FINAL   Final   Medical History: Past Medical History   Diagnosis Date  . Hypertension   . Type 2 diabetes mellitus     Type II  . Atrial fibrillation   . Nephrolithiasis   . Overweight(278.02)   . UTI (urinary tract infection)   . Hypothyroidism (acquired)    Medications:  Scheduled:  . antiseptic oral rinse  15 mL Mouth Rinse QID  . bimatoprost  1 drop Both Eyes QHS  . furosemide  60 mg Intravenous BID  . insulin aspart  0-15 Units Subcutaneous TID WC  . insulin aspart  0-5 Units Subcutaneous QHS  . levofloxacin (LEVAQUIN) IV  750 mg Intravenous Q48H  . Levothyroxine Sodium  100 mcg Intravenous Q24H  . nystatin   Topical BID  . pantoprazole (PROTONIX) IV  40 mg Intravenous Daily  . sodium chloride  10-40 mL Intracatheter Q12H  . vancomycin  750 mg Intravenous Q24H   Assessment: 62 yo obese F admitted with HF exacerbation has now decompensated, on ventilator with suspicion for HAP.  She is currently on day#8  Vancomycin & Levaquin. Acute renal failure continues to improve.  Patient remains afebrile, cx data negative, and WBC trending down.  Trough level is slightly above goal range.  Levaquin 04/17/13>> Vancomycin 04/17/13>>  Goal of Therapy:  Vancomycin trough level 15-20 mcg/ml  Plan:  1) Continue Levaquin 750mg  IV q48h 2  decrease Vancomycin to 750mg  IV Q24h 3) Re-check Vancomycin trough at steady state 4) Monitor renal function and cx data   Valrie Hart A 04/24/2013,7:49 AM

## 2013-04-24 NOTE — Progress Notes (Signed)
4 Days Post-Op  Subjective: Extubated, appears to be given well. No complaints.  Objective: Vital signs in last 24 hours: Temp:  [97.8 F (36.6 C)-98.9 F (37.2 C)] 97.8 F (36.6 C) (07/02 0511) Pulse Rate:  [106-122] 107 (07/02 0511) Resp:  [20-23] 22 (07/02 0511) BP: (124-134)/(85-90) 124/90 mmHg (07/02 0511) SpO2:  [96 %-99 %] 99 % (07/02 0511) Arterial Line BP: (84-98)/(68-78) 98/77 mmHg (07/01 1500) Weight:  [95.4 kg (210 lb 5.1 oz)-96.798 kg (213 lb 6.4 oz)] 95.4 kg (210 lb 5.1 oz) (07/02 0457) Last BM Date: 04/14/13  Intake/Output from previous day: 07/01 0701 - 07/02 0700 In: 2032.3 [I.V.:960; IV Piggyback:403.3; TPN:669] Out: 4200 [Urine:4200] Intake/Output this shift: Total I/O In: -  Out: 900 [Urine:900]  General appearance: no distress Extremities: warm, distal pulses intact.    Lab Results:   Recent Labs  04/24/13 0456 04/24/13 1017  WBC 12.6* 14.3*  HGB 8.0* 9.3*  HCT 28.7* 29.3*  PLT 118* 144*   BMET  Recent Labs  04/24/13 0655 04/24/13 1019  NA 139 138  K 3.5 3.5  CL 92* 91*  CO2 43* 44*  GLUCOSE 190* 220*  BUN 28* 28*  CREATININE 1.40* 1.41*  CALCIUM 8.7 8.6   PT/INR  Recent Labs  04/23/13 0451 04/24/13 0456  LABPROT 15.5* 15.5*  INR 1.26 1.26   ABG  Recent Labs  04/23/13 0805 04/24/13 0855  PHART 7.469* 7.462*  HCO3 38.4* 41.3*    Studies/Results: Dg Chest Port 1 View  04/24/2013   *RADIOLOGY REPORT*  Clinical Data: Respiratory failure, follow-up  PORTABLE CHEST - 1 VIEW  Comparison: Portable chest x-ray of 04/23/2013  Findings: The lungs appears slightly better aerated.  There is pulmonary vascular congestion remaining,  possibly slightly increased, with basilar atelectasis and/or small effusions. Cardiomegaly is stable.  Left PICC line remains.  IMPRESSION: Minimal improvement in aeration.  Slight worsening of pulmonary vascular congestion with basilar opacities consistent with atelectasis and/or effusion.   Original  Report Authenticated By: Dwyane Dee, M.D.   Dg Chest Port 1 View  04/23/2013   *RADIOLOGY REPORT*  Clinical Data: Respiratory failure  PORTABLE CHEST - 1 VIEW  Comparison: 04/22/2013  Findings: Left PICC line in unchanged position.  The patient has been extubated.  Cardiac silhouette is mildly to moderately enlarged.  There is mild central vascular congestion.  There is mild diffuse interstitial prominence.  There is a left lower lobe retrocardiac opacity which appears stable.  There is opacity in the medial right middle lobe, also unchanged.  IMPRESSION: Cardiac enlargement with vascular congestion and bilateral infiltrates suggesting pulmonary edema in the setting of congestive heart failure.  No significant change status post extubation.   Original Report Authenticated By: Esperanza Heir, M.D.   Dg Chest Port 1 View  04/22/2013   *RADIOLOGY REPORT*  Clinical Data: Line placement  PORTABLE CHEST - 1 VIEW  Comparison: Portable exam 1247 hours compared to 04/22/2013  Findings: Tip of the left arm PICC line projects over SVC near cavoatrial junction. Enlargement of cardiac silhouette with pulmonary vascular congestion. Diffuse infiltrates most consistent with pulmonary edema and CHF. Tip of endotracheal tube in satisfactory position above carina. Probable small left pleural effusion. No pneumothorax.  IMPRESSION: Persistent CHF. Tip of left arm PICC line projects over SVC near the cavoatrial junction.   Original Report Authenticated By: Ulyses Southward, M.D.    Anti-infectives: Anti-infectives   Start     Dose/Rate Route Frequency Ordered Stop   04/24/13 1600  vancomycin (VANCOCIN)  IVPB 750 mg/150 ml premix     750 mg 150 mL/hr over 60 Minutes Intravenous Every 24 hours 04/24/13 0746     04/21/13 0600  vancomycin (VANCOCIN) IVPB 1000 mg/200 mL premix  Status:  Discontinued     1,000 mg 200 mL/hr over 60 Minutes Intravenous Every 24 hours 04/20/13 1012 04/24/13 0746   04/17/13 1000  vancomycin (VANCOCIN)  1,500 mg in sodium chloride 0.9 % 500 mL IVPB  Status:  Discontinued     1,500 mg 250 mL/hr over 120 Minutes Intravenous Every 24 hours 04/17/13 0807 04/20/13 1012   04/17/13 0900  levofloxacin (LEVAQUIN) IVPB 750 mg     750 mg 100 mL/hr over 90 Minutes Intravenous Every 48 hours 04/17/13 0807     04/17/13 0800  levofloxacin (LEVAQUIN) IVPB 500 mg  Status:  Discontinued     500 mg 100 mL/hr over 60 Minutes Intravenous Every 24 hours 04/17/13 0747 04/17/13 0807      Assessment/Plan: s/p Procedure(s): GROIN EXPLORATION (Right) Overall doing well. Discontinue staples in 1 week. Okay for her skilled nursing facility from my standpoint. Will follow peripherally.  LOS: 10 days    Nikai Quest C 04/24/2013

## 2013-04-24 NOTE — Progress Notes (Addendum)
[facility] PROGRESS NOTE  Amber Armstrong:[id number] DOB: [birth year] DOA: 04/14/2013 PCP: Pershing Proud  Brief Narrative  This lady was admitted to the hospital with shortness of breath due to CHF exacerbation.  She was started on intravenous lasix but did not have any significant response.  On 6/25 patient was found to be unresponsive and had a significant respiratory acidosis with a pco2 of 125 and pH of 7.06.  She was transferred to the ICU and intubated.  She was followed by cardiology for CHF, pulmonology for ventilator management and nephrology for acute renal failure.  Patient was found to be hypotensive, so femoral central line was placed by general surgery for vasopressor support to aide with diuresis.  Patient began to experience good response to lasix and overall condition was starting to improve. Creatinine also began to improve with diuresis.  Patient began to experience thick secretions from ET tube, and chest xray raised concerns for possible developing pneumonia.  She was started empirically on vancomycin and levofloxacin. Patient subsequently had a picc line placed for continued central access.  Once Picc line was placed, femoral central line was discontinued.  Unfortunately, patient had significant bleeding from right groin and developed a large right groin hematoma with pseudoaneurysm.  This was also complicated by the fact that patient was on full dose lovenox for atrial fibrillation. This eventually required operative repair by Dr. Leticia Penna.  Patient was transfused multiple units of FFP and PRBC for bleeding and anemia.  At this time, it appears that her bleeding has resolved.  By 7/1 morning, patient was able to be successfully weaned off the ventilator.   Assessment/Plan:  Acute on chronic respiratory failure, multifactorial. Patient extubated off the ventilator this morning. ventilator. Continuing diuresis, transitioned to po Lasix today per renal.  Acute  right-sided congestive heart failure. Edema improving. Currently she is -7 L since admission Continue diuresis and appreciate cardiology help Acute renal failure. Creatinine continued to improve, Nephrology is following.  Continue to monitor urine output Atrial fibrillation. Patient is having episodes of intermittent tachycardia. Will need to be cautious with beta blockers since patient is prone to hypotension. Anticoagulation has been discontinued due to bleeding from right groin site Hemorrhagic shock. Due to bleeding from right groin site. Blood pressure better. Status post 5 units packed red blood cells Acute blood loss anemia. Due to bleeding from right groin site. Patient received 5 units of PRBCs and 2 units of FFP. Hemoglobin unchanged Type 2 diabetes. Blood sugars have been stable. Hypokalemia: Replace as indicated Acute right groin hematoma with pseudoaneurysm. Patient had significant bleeding from right groin when her femoral central line was discontinued yesterday, complicated by the fact that she was anticoagulated with Lovenox. Patient received protamine as well as FFP to reverse her coagulopathy. She had significant bleeding requiring transfusion of PRBCs and FFPs. Ultimately, she required surgical repair by Dr. Leticia Penna to achieve hemostasis. Stable. Nutrition: started oral intake, will monitor and d/c TPN likely soon. Consulted nutrition.    Code Status: Patient was made DO NOT RESUSCITATE by Dr. Karilyn Cota.; Family Communication: Discussed with patient's mother bedside Disposition Plan:  Likely will need short-term skilled nursing for rehabilitation   Consultants:  [facility] cardiology  Nephrology, Dr. Kristian Covey  General surgery, Dr. Leticia Penna  Pulmonary, Dr. Juanetta Gosling  Procedures:  Intubation on 6/25, extubated 7/1  Central line placement on 6/25, by Dr. Leticia Penna  Right groin exploration, suture repair of right femoral vein, suture repair artery, right radial arterial line  placement 6/28 by  Dr. Leticia Penna  Antibiotics: Vancomycin 6/25 Levofloxacin 6/25  HPI/Subjective: Patient extubated, awake. Breathing comfortably. Slightly hoarse but otherwise no complaints.  Objective: Filed Vitals:   04/23/13 2234 04/24/13 0410 04/24/13 0457 04/24/13 0511  BP: 134/85   124/90  Pulse: 120   107  Temp: 98.2 F (36.8 C)   97.8 F (36.6 C)  TempSrc:    Oral  Resp: 22   22  Height:      Weight:  96.798 kg (213 lb 6.4 oz) 95.4 kg (210 lb 5.1 oz)   SpO2: 96%   99%    Intake/Output Summary (Last 24 hours) at 04/24/13 1255 Last data filed at 04/24/13 1000  Gross per 24 hour  Intake   1329 ml  Output   5100 ml  Net  -3771 ml   Filed Weights   04/23/13 0503 04/24/13 0410 04/24/13 0457  Weight: 99.3 kg (218 lb 14.7 oz) 96.798 kg (213 lb 6.4 oz) 95.4 kg (210 lb 5.1 oz)    Exam:   General:  NAD, awake and no acute distress  Cardiovascular: Irregular rhythm, rate controlled  Respiratory: Occasional rhonchi, mostly clear  Abdomen: soft, nt, nd, bs+ Musculoskeletal: 1+ edema b/l in lower extremities, right groin tender to palpation, ecchymotic. Right arm with large ecchymosis.  Data Reviewed: Basic Metabolic Panel:  Recent Labs Lab 04/18/13 0430 04/19/13 0416  04/22/13 0444 04/23/13 0451 04/23/13 1035 04/24/13 0655 04/24/13 1019  NA 147* 143  < > 142 139 138 139 138  K 3.1* 3.4*  < > 3.0* 3.4* 3.2* 3.5 3.5  CL 101 94*  < > 95* 95* 93* 92* 91*  CO2 42* 40*  < > 39* 41* 40* 43* 44*  GLUCOSE 97 124*  < > 116* 168* 182* 190* 220*  BUN 44* 39*  < > 33* 29* 29* 28* 28*  CREATININE 1.76* 1.77*  < > 1.74* 1.50* 1.47* 1.40* 1.41*  CALCIUM 8.8 8.4  < > 8.1* 8.2* 8.4 8.7 8.6  MG  --   --   --   --  1.7  --   --   --   PHOS 1.6* 2.8  --   --  2.0*  --   --   --   < > = values in this interval not displayed. Liver Function Tests:  Recent Labs Lab 04/23/13 0451  AST 18  ALT 10  ALKPHOS 67  BILITOT 1.2  PROT 5.3*  ALBUMIN 2.3*   CBC:  Recent  Labs Lab 04/23/13 0451 04/23/13 1034 04/23/13 1742 04/24/13 0125 04/24/13 0456 04/24/13 1017  WBC 13.2* 15.0* 15.4* 12.9* 12.6* 14.3*  NEUTROABS 9.7*  --   --   --   --   --   HGB 8.1* 8.8* 8.9* 8.2* 8.0* 9.3*  HCT 24.6* 27.3* 28.2* 27.9* 28.7* 29.3*  MCV 89.8 90.7 91.3 101.8* 106.7* 92.4  PLT 105* 118* 127* 114* 118* 144*   BNP (last 3 results)  Recent Labs  04/14/13 1030  PROBNP 12159.0*   CBG:  Recent Labs Lab 04/23/13 1131 04/23/13 1631 04/23/13 2139 04/24/13 0729 04/24/13 1153  GLUCAP 161* 210* 144* 195* 189*       Studies: Dg Chest Port 1 View  04/22/2013  IMPRESSION:  1.  Stable support apparatus. 2.  Shifting bilateral airspace disease most compatible with pulmonary edema.  Superimposed basilar atelectasis and shifting pleural effusions.   Original Report Authenticated By: Andreas Newport, M.D.   Dg Chest [facility]  04/21/2013  IMPRESSION: Worsening edema throughout the left lung compared to the preceding study.   Original Report Authenticated By: Sander Radon, M.D.    Scheduled Meds: . antiseptic oral rinse  15 mL Mouth Rinse QID  . bimatoprost  1 drop Both Eyes QHS  . furosemide  60 mg Oral BID  . insulin aspart  2-10 Units Subcutaneous Q4H  . levofloxacin (LEVAQUIN) IV  750 mg Intravenous Q48H  . Levothyroxine Sodium  100 mcg Intravenous Q24H  . nystatin   Topical BID  . pantoprazole (PROTONIX) IV  40 mg Intravenous Daily  . sodium chloride  10-40 mL Intracatheter Q12H  . vancomycin  750 mg Intravenous Q24H   Continuous Infusions: . Marland KitchenTPN (CLINIMIX-E) Adult 60 mL/hr at 04/23/13 1815  . Marland KitchenTPN (CLINIMIX-E) Adult     And  . fat emulsion    . sodium chloride 0.45 % 1,000 mL with potassium chloride 40 mEq infusion      Active Problems:   DIABETES MELLITUS, TYPE II   HYPERLIPIDEMIA   Morbid obesity with BMI of 40.0-44.9, adult   HYPERTENSION   FIBRILLATION, ATRIAL   Long term current use of anticoagulant   CHF exacerbation   Acute  respiratory failure   Acute renal failure   Acute diastolic heart failure   Hematoma of groin   Pseudoaneurysm   Acute blood loss anemia   Hemorrhagic shock    Time spent: 35 mins   Pamella Pert  [facility] Pager [phone]. If 7PM-7AM, please contact night-coverage at [email or web address], password [facility] 04/24/2013, 12:55 PM  LOS: 10 days

## 2013-04-24 NOTE — Evaluation (Signed)
Occupational Therapy Evaluation Patient Details Name: AMAREA MACDOWELL MRN: [id number] DOB: [birth year] Today's Date: 04/24/2013 Time: 4098-1191 OT Time Calculation (min): 35 min  OT Assessment / Plan / Recommendation History of present illness Pt is employed full time as a Conservation officer, nature at [employer].  She was in the ICU for tx of respiratory failure due to COPD.  She came off of ventilator yesterday.   Clinical Impression   Patient is a 62 y/o female s/p COPD presenting to acute OT with deficits below. Patient will benefit from OT services to increase ADL performance, functional transfers, and BUE strength and endurance.     OT Assessment  Patient needs continued OT Services    Follow Up Recommendations  SNF       Equipment Recommendations  None recommended by OT       Frequency  Min 2X/week    Precautions / Restrictions Precautions Precautions: Fall Restrictions Weight Bearing Restrictions: No   Pertinent Vitals/Pain No complaints.     ADL  Lower Body Dressing: Performed;+1 Total assistance (pt uses reacher at home to assist with donning socks) Where Assessed - Lower Body Dressing: Supported sitting Toilet Transfer: Performed;Minimal assistance Toilet Transfer Method: Stand pivot Acupuncturist:  (to recliner)    OT Diagnosis: Generalized weakness  OT Problem List: Decreased strength;Decreased range of motion;Decreased activity tolerance;Impaired balance (sitting and/or standing);Pain OT Treatment Interventions: Self-care/ADL training;Therapeutic activities;Therapeutic exercise;Energy conservation;Modalities;Manual therapy;Patient/family education;Balance training   OT Goals(Current goals can be found in the care plan section)    Visit Information  Last OT Received On: 04/24/13 PT/OT Co-Evaluation/Treatment: Yes History of Present Illness: Pt is employed full time as a Conservation officer, nature at [employer].  She was in the ICU for tx of respiratory failure due to COPD.  She came off  of ventilator yesterday.       Prior Functioning     Home Living Family/patient expects to be discharged to:: Private residence Living Arrangements: Parent Available Help at Discharge: Family;Available PRN/intermittently Type of Home: House Home Access: Stairs to enter [employer] of Steps: 3 Entrance Stairs-Rails: Left Home Layout: One level Home Equipment: Walker - 2 wheels;Gil[place] Mor - single point Additional Comments: Lives with 6 y/o mother. Prior Function Level of Independence: Independent Comments: works as a Conservation officer, nature at [employer]: No difficulties Dominant Hand: Right         Vision/Perception Vision - History Baseline Vision: No visual deficits Patient Visual Report: No change from baseline   Cognition  Cognition Arousal/Alertness: Awake/alert Behavior During Therapy: WFL for tasks assessed/performed Overall Cognitive Status: Within Functional Limits for tasks assessed    Extremity/Trunk Assessment Upper Extremity Assessment Upper Extremity Assessment: Generalized weakness     Mobility Bed Mobility Bed Mobility: Supine to Sit;Sitting - Scoot to Edge of Bed Supine to Sit: 4: Min assist;With rails Sitting - Scoot to [place] of Bed: 4: Min assist Details for Bed Mobility Assistance: pt has difficulty moving due to right groin soreness Transfers Sit to Stand: 4: Min assist;With upper extremity assist Stand to Sit: 4: Min guard;With upper extremity assist Details for Transfer Assistance: Pt able to stand x 2 minutes with min guard and cueing for posture     Exercise General Exercises - Upper Extremity Shoulder Flexion: AROM;Both;10 reps;Seated Elbow Flexion: AROM;Both;10 reps;Seated General Exercises - Lower Extremity Ankle Circles/Pumps: AROM;Both;10 reps Long Arc Quad: AROM;Both;10 reps Hip ABduction/ADduction: AAROM;Both;5 reps Hip Flexion/Marching: AROM;Left;10 reps Other Exercises Other Exercises: seated proximal  rythmic shoulder strengthening; 10X, circles Other Exercises: MFR to  right shoulder to decrease fascial restrictions and muscle tightness.      End of Session OT - End of Session Equipment Utilized During Treatment: Gait belt Activity Tolerance: Patient tolerated treatment well Patient left: in chair;with call bell/phone within reach;with family/visitor present    Limmie Patricia, OTR/L,CBIS   04/24/2013, 12:27 PM

## 2013-04-24 NOTE — Progress Notes (Signed)
Amber Armstrong  MRN: [id number]  DOB/AGE: Dec 03, 1950 62 y.o.  Primary Care Physician:JACKSON,SAMANTHA, PA-C  Admit date: 04/14/2013  Chief Complaint:  Chief Complaint  Patient presents with  . Shortness of Breath    S-Pt presented on  04/14/2013 with  Chief Complaint  Patient presents with  . Shortness of Breath  .    Pt today feels better.    Pt offers no new complaints.  Meds . antiseptic oral rinse  15 mL Mouth Rinse QID  . bimatoprost  1 drop Both Eyes QHS  . furosemide  60 mg Intravenous BID  . insulin aspart  0-15 Units Subcutaneous TID WC  . insulin aspart  0-5 Units Subcutaneous QHS  . levofloxacin (LEVAQUIN) IV  750 mg Intravenous Q48H  . Levothyroxine Sodium  100 mcg Intravenous Q24H  . nystatin   Topical BID  . pantoprazole (PROTONIX) IV  40 mg Intravenous Daily  . sodium chloride  10-40 mL Intracatheter Q12H  . vancomycin  750 mg Intravenous Q24H        Physical Exam: Vital signs in last 24 hours: Temp:  [97.8 F (36.6 C)-98.9 F (37.2 C)] 97.8 F (36.6 C) (07/02 0511) Pulse Rate:  [106-122] 107 (07/02 0511) Resp:  [20-23] 22 (07/02 0511) BP: (124-134)/(85-90) 124/90 mmHg (07/02 0511) SpO2:  [96 %-99 %] 99 % (07/02 0511) Arterial Line BP: (73-98)/(58-78) 98/77 mmHg (07/01 1500) Weight:  [210 lb 5.1 oz (95.4 kg)-213 lb 6.4 oz (96.798 kg)] 210 lb 5.1 oz (95.4 kg) (07/02 0457) Weight change: -5 lb 8.3 oz (-2.502 kg) Last BM Date: 04/14/13  Intake/Output from previous day: 07/01 0701 - 07/02 0700 In: 2032.3 [I.V.:960; IV Piggyback:403.3; TPN:669] Out: 4200 [Urine:4200]     Physical Exam: General- pt is awake,alert, oriented to time place and person Resp- No acute REsp distress, Decreased BS at bases. CVS- S1S2 Irregular in rate and rhythm GIT- BS+, soft, NT, ND EXT-  Right groin Hematoma, Tender on palpation .           NO LE edema.    Lab Results: CBC  Recent Labs  04/24/13 0125 04/24/13 0456  WBC 12.9* 12.6*  HGB 8.2* 8.0*  HCT  27.9* 28.7*  PLT 114* 118*    BMET  Recent Labs  04/23/13 1035 04/24/13 0655  NA 138 139  K 3.2* 3.5  CL 93* 92*  CO2 40* 43*  GLUCOSE 182* 190*  BUN 29* 28*  CREATININE 1.47* 1.40*  CALCIUM 8.4 8.7   Trend Creat 2014 2.0==>1.7==>1.4 2013 1.0 2011 1.0  MICRO Recent Results (from the past 240 hour(s))  MRSA PCR SCREENING     Status: None   Collection Time    04/17/13  6:18 AM      Result Value Range Status   MRSA by PCR NEGATIVE  NEGATIVE Final   Comment:            The GeneXpert MRSA Assay (FDA     approved for NASAL specimens     only), is one component of a     comprehensive MRSA colonization     surveillance program. It is not     intended to diagnose MRSA     infection nor to guide or     monitor treatment for     MRSA infections.  URINE CULTURE     Status: None   Collection Time    04/17/13 11:40 AM      Result Value Range Status   Specimen Description URINE,  CLEAN CATCH   Final   Special Requests NONE   Final   Culture  Setup Time 04/17/2013 12:20   Final   Colony Count NO GROWTH   Final   Culture NO GROWTH   Final   Report Status 04/18/2013 FINAL   Final      Lab Results  Component Value Date   CALCIUM 8.7 04/24/2013   PHOS 2.0* 04/23/2013               Impression: 1)Renal  AKI secondary to Prerenal /ATN/ Cardiorenal   AKI secondary to Hypotension/Hemorragic shock AKi now better-responded to IV pressors + lasix Creat trending down  2)Hypotension BP at goal Agree with stopping Pressors  3)Anemia HGb at goal (More than 7) Secondary to bleeding  4)Afib Being followed By Cardiology  5)REsp- Pt has acute resp failure- Was intubated now extubated clinically much better.  6)Electrolytes  Normokalemic-was hypokalemia NOrmonatremic  7)Acid base Co2 at goal     Plan:  Pt was on Vasopressors for hypotension - Not Renal Dose Dopamine. Agree with stopping it at this time Will decrease Lasix dose by changing to PO from  IV Will follow bmet      BHUTANI,MANPREET S 04/24/2013, 9:34 AM

## 2013-04-24 NOTE — Progress Notes (Addendum)
PARENTERAL NUTRITION CONSULT NOTE  Pharmacy Consult for TPN Indication: prolonged intubation  Allergies  Allergen Reactions  . Penicillins Rash   Patient Measurements: Height: 5' (152.4 cm) Weight: 210 lb 5.1 oz (95.4 kg) IBW/kg (Calculated) : 45.5 Adjusted Body Weight: 67Kg *pt has CHF with fluid retention and weight fluctuates  Vital Signs: Temp: 97.8 F (36.6 C) (07/02 0511) Temp src: Oral (07/02 0511) BP: 124/90 mmHg (07/02 0511) Pulse Rate: 107 (07/02 0511) Intake/Output from previous day: 07/01 0701 - 07/02 0700 In: 2032.3 [I.V.:960; IV Piggyback:403.3; TPN:669] Out: 4200 [Urine:4200] Intake/Output from this shift:    Labs:  Recent Labs  04/22/13 0444  04/23/13 0451  04/23/13 1742 04/24/13 0125 04/24/13 0456  WBC 12.4*  < > 13.2*  < > 15.4* 12.9* 12.6*  HGB 8.8*  < > 8.1*  < > 8.9* 8.2* 8.0*  HCT 26.3*  < > 24.6*  < > 28.2* 27.9* 28.7*  PLT 90*  < > 105*  < > 127* 114* 118*  INR 1.20  --  1.26  --   --   --  1.26  < > = values in this interval not displayed.   Recent Labs  04/23/13 0451 04/23/13 1035 04/24/13 0655  NA 139 138 139  K 3.4* 3.2* 3.5  CL 95* 93* 92*  CO2 41* 40* 43*  GLUCOSE 168* 182* 190*  BUN 29* 29* 28*  CREATININE 1.50* 1.47* 1.40*  CALCIUM 8.2* 8.4 8.7  MG 1.7  --   --   PHOS 2.0*  --   --   PROT 5.3*  --   --   ALBUMIN 2.3*  --   --   AST 18  --   --   ALT 10  --   --   ALKPHOS 67  --   --   BILITOT 1.2  --   --   PREALBUMIN 7.1*  --   --   TRIG 96  --   --    Estimated Creatinine Clearance: 42.5 ml/min (by C-G formula based on Cr of 1.4).    Recent Labs  04/23/13 1631 04/23/13 2139 04/24/13 0729  GLUCAP 210* 144* 195*   Medical History: Past Medical History  Diagnosis Date  . Hypertension   . Type 2 diabetes mellitus     Type II  . Atrial fibrillation   . Nephrolithiasis   . Overweight(278.02)   . UTI (urinary tract infection)   . Hypothyroidism (acquired)    Medications:  Scheduled:  . antiseptic  oral rinse  15 mL Mouth Rinse QID  . bimatoprost  1 drop Both Eyes QHS  . furosemide  60 mg Oral BID  . insulin aspart  0-15 Units Subcutaneous TID WC  . insulin aspart  0-5 Units Subcutaneous QHS  . levofloxacin (LEVAQUIN) IV  750 mg Intravenous Q48H  . Levothyroxine Sodium  100 mcg Intravenous Q24H  . nystatin   Topical BID  . pantoprazole (PROTONIX) IV  40 mg Intravenous Daily  . sodium chloride  10-40 mL Intracatheter Q12H  . vancomycin  750 mg Intravenous Q24H    Insulin Requirements in the past 24 hours:  ~ 16 units  Current Nutrition:  TPN  Assessment: 62yo female admitted on 6/22 with HF exacerbation.  On 6/25 pt was transferred to ICU due to respiratory failure requiring intubation.  Pt has been diuresed and has continue to improve.  Pt went to OR emergently on 6/28 due to large groin hematoma with bleeding which required surgical  repair (on Lovenox for Afib which has been stopped).  She was started on TPN due to anticipation of prolonged intubation.  Pt has now been extubated.  Discussed case with dietician and pt has agreed to pureed diet.     24hr I/O -2L Sodium and potassium have been corrected. CBG's and serum glucose are elevated on TPN, pt getting SSI   Nutritional Goals:  Meet > 90% of pt's needs  Plan:  Discussed with Dietician.  Pt has agreed to a pureed diet and does have appetite.  We will taper and stop TPN and observe pt on diet.  Valrie Hart A 04/24/2013,10:04 AM

## 2013-04-24 NOTE — Progress Notes (Signed)
Physical Therapy Treatment Patient Details Name: RUDENE POULSEN MRN: [id number] DOB: May 30, 1951 Today's Date: 04/24/2013 Time: 4098-1191 PT Time Calculation (min): 43 min  PT Assessment / Plan / Recommendation  PT Comments   Co-Tx with OT this session.  Pt improving strength with bed mobility, less assistance required with supine to sit just vc-ing for handplacement to assist.  Sit to stand training with min assistance, pt able to stand for 3 minutes with intermitternt HHA and cueing for posture.  Pt able to ambulate 4 min RW with min-mod assistance.  Very limited by fatigue following transfer.  O2 sat at 95% sitting following therapy.  Pt stated Rt groin region continues to be sore, no reports of increased pain with activity.    Follow Up Recommendations        Does the patient have the potential to tolerate intense rehabilitation     Barriers to Discharge        Equipment Recommendations       Recommendations for Other Services    Frequency     Progress towards PT Goals    Plan      Precautions / Restrictions Precautions Precautions: Fall Restrictions Weight Bearing Restrictions: No    Mobility  Bed Mobility Bed Mobility: Supine to Sit;Sitting - Scoot to Edge of Bed Supine to Sit: 4: Min assist;With rails Sitting - Scoot to [place] of Bed: 4: Min assist Details for Bed Mobility Assistance: pt has difficulty moving due to right groin soreness Transfers Transfers: Sit to Stand;Stand to Sit Sit to Stand: 4: Min assist;With upper extremity assist Stand to Sit: 4: Min guard;With upper extremity assist Details for Transfer Assistance: Pt able to stand x 2 minutes with min guard and cueing for posture Ambulation/Gait Ambulation/Gait Assistance: 4: Min assist Ambulation Distance (Feet): 4 Feet Assistive device: Rolling walker Gait Pattern: Decreased stance time - right;Decreased step length - left Gait velocity: slow and labored  Stairs: No    Exercises General Exercises -  Lower Extremity Ankle Circles/Pumps: AROM;Both;10 reps Long Arc Quad: AROM;Both;10 reps Hip ABduction/ADduction: AAROM;Both;5 reps Hip Flexion/Marching: AROM;Left;10 reps   PT Diagnosis:    PT Problem List:   PT Treatment Interventions:     PT Goals (current goals can now be found in the care plan section)    Visit Information  Last PT Received On: 04/24/13 PT/OT Co-Evaluation/Treatment: Yes    Subjective Data      Cognition  Cognition Arousal/Alertness: Awake/alert Behavior During Therapy: WFL for tasks assessed/performed Overall Cognitive Status: Within Functional Limits for tasks assessed    Balance     End of Session PT - End of Session Equipment Utilized During Treatment: Gait belt Activity Tolerance: Patient tolerated treatment well Patient left: in chair;with call bell/phone within reach;with family/visitor present Nurse Communication: Mobility status   GP     Juel Burrow 04/24/2013, 10:19 AM

## 2013-04-24 NOTE — Progress Notes (Signed)
Patient ID: Amber Armstrong, female   DOB: 1950-11-24, 62 y.o.   MRN: [id number]   Primary cardiologist: Dr. Valera Castle  SUBJECTIVE: Wants to go home  No appetite    LABS: Basic Metabolic Panel:  Recent Labs  78/29/56 0451 04/23/13 1035 04/24/13 0655  NA 139 138 139  K 3.4* 3.2* 3.5  CL 95* 93* 92*  CO2 41* 40* 43*  GLUCOSE 168* 182* 190*  BUN 29* 29* 28*  CREATININE 1.50* 1.47* 1.40*  CALCIUM 8.2* 8.4 8.7  MG 1.7  --   --   PHOS 2.0*  --   --    Liver Function Tests:  Recent Labs  04/23/13 0451  AST 18  ALT 10  ALKPHOS 67  BILITOT 1.2  PROT 5.3*  ALBUMIN 2.3*   CBC:  Recent Labs  04/23/13 0451  04/24/13 0125 04/24/13 0456  WBC 13.2*  < > 12.9* 12.6*  NEUTROABS 9.7*  --   --   --   HGB 8.1*  < > 8.2* 8.0*  HCT 24.6*  < > 27.9* 28.7*  MCV 89.8  < > 101.8* 106.7*  PLT 105*  < > 114* 118*  < > = values in this interval not displayed.  Fasting Lipid Panel:  Recent Labs  04/23/13 0451  TRIG 96    RADIOLOGY: Dg Chest Port 1 View  04/22/2013   *RADIOLOGY REPORT*  Clinical Data: Line placement  PORTABLE CHEST - 1 VIEW  Comparison: Portable exam 1247 hours compared to 04/22/2013  Findings: Tip of the left arm PICC line projects over SVC near cavoatrial junction. Enlargement of cardiac silhouette with pulmonary vascular congestion. Diffuse infiltrates most consistent with pulmonary edema and CHF. Tip of endotracheal tube in satisfactory position above carina. Probable small left pleural effusion. No pneumothorax.  IMPRESSION: Persistent CHF. Tip of left arm PICC line projects over SVC near the cavoatrial junction.   Original Report Authenticated By: Ulyses Southward, M.D.    PHYSICAL EXAM BP 124/90  Pulse 107  Temp(Src) 97.8 F (36.6 C) (Oral)  Resp 22  Ht 5' (1.524 m)  Wt 210 lb 5.1 oz (95.4 kg)  BMI 41.08 kg/m2  SpO2 99% WT loss 21 lbs from highest recorded this admission. General: No acute distress Cor: Irregularly irregular, no gallop Lungs: Clear  bilaterally to auscultation, coarse breath sounds. Abdomen: Bowel sounds are positive, abdomen soft. Extremities: Hematoma to right groin, pain with palpation or movement. Erythema with discoloration is noted. Dressing with drainage noted. Generalized edema.   DP +1 PIC line LUE  Marked echymosis RUE   Neuro: Alert and oriented X 3.  TELEMETRY: Atrial fibrillation rates in the 70-80s.    ASSESSMENT AND PLAN:  1. Cor Pulmonale: Has had good diuresis Continue iv diuretic.  Edema improved.  I see no indication for "renal dose dopamine" with normal EF and Cr 1.4  Will stop  2. VDRF: Multifactorial with right sided heart failure and hypoventilation syndrome. She is difficult to wean. Refuses pulmonary rehab ctr to assist with vent weaning. Dr. Juanetta Gosling managing.  3. Atrial fibrillation: No anticoagulation secondary to right groin bleed from femoral arterial line site, which has now been removed.  See what rates do off dopamine Would not restart anticoagulation for at least 2 weeks  4. Hypokalemia: Potassium 3.4 this am. Will give replacement.  5.Anemia: Acute blood loss from right femoral arterial line bleed. Hgb is decreasing from 8.4 to 8.1 this am. Consider blood transfusion if lower to assist with CHF and  oxygenation.   Charlton Haws

## 2013-04-24 NOTE — Progress Notes (Signed)
Nutrition Follow-up  INTERVENTION:  TPN being tapered per pharmacy-pt is sucessfully resuming oral intake  Downgrade diet to Pureed per pt preference due to sore mouth related to recent multiple tooth extractions  Add Glucerna Shake po BID, each supplement provides 220 kcal and 10 grams of protein.  Add MVI daily  Recommend MD evaluate pt for stool softener. No BM since 04/14/13.  RD will continue to follow nutrition care  NUTRITION DIAGNOSIS: Inadequate oral intake;improving  Goal: Pt will successfully transition from TPN  to meet > 90% of her nutritional needs via oral intake; progressing.  Monitor:  Nutrition care plan progression  62 y.o. female  Admitting Dx: acute respiratory failure  ASSESSMENT: Pt has been moved out of ICU and diet advanced CHO Modified. Her appetite is good however she's having difficulty chewing her food due to recent having multiple teeth extracted. She requests her foods be pureed for the time being. Pt also agreeable to receive nutrition supplement to assist with meeting her macro/micro nutrient needs. Discussed nutrition plan with pharmacy team. TPN is being tapered an will be d/c later today with expectation for pt to meet her nutrition requirements orally.   Nutrition hx: Her appetite good and wt stable prior to acute illness. Lives with her 28 yo mother who is very active. Her mother says pt mouth is still sore due to having 8 teeth extracted recently and when she is able to resume oral nutrition would prefer soft foods. Usual home diet is Regular. No food allergies.   Height: Ht Readings from Last 1 Encounters:  04/22/13 5' (1.524 m)    Weight: Wt Readings from Last 1 Encounters:  04/24/13 210 lb 5.1 oz (95.4 kg)    Ideal Body Weight: 100# (45.4 kg)  % Ideal Body Weight: 219%  Wt Readings from Last 10 Encounters:  04/24/13 210 lb 5.1 oz (95.4 kg)  04/24/13 210 lb 5.1 oz (95.4 kg)  12/12/12 202 lb 1.3 oz (91.663 kg)  04/11/12 204 lb  (92.534 kg)  03/28/12 205 lb (92.987 kg)  04/19/11 201 lb (91.173 kg)  09/23/10 201 lb (91.173 kg)  06/23/10 201 lb (91.173 kg)  06/14/10 199 lb (90.266 kg)  12/10/09 206 lb (93.441 kg)    Usual Body Weight: 200-205#  % Usual Body Weight: 109%  BMI:  Body mass index is 41.08 kg/(m^2). Extreme Obesity Class III (skewed due to pt fluid status)  Estimated Nutritional Needs: Kcal: 1200-1500 Protein: 90-112 gr Fluid: per MD goals  Skin: No issues noted  Diet Order: Carb Control  EDUCATION NEEDS: -No education needs identified at this time   Intake/Output Summary (Last 24 hours) at 04/24/13 1439 Last data filed at 04/24/13 1000  Gross per 24 hour  Intake   1209 ml  Output   5100 ml  Net  -3891 ml    Last BM: 04/14/13  Labs:   Recent Labs Lab 04/18/13 0430 04/19/13 0416  04/23/13 0451 04/23/13 1035 04/24/13 0655 04/24/13 1019  NA 147* 143  < > 139 138 139 138  K 3.1* 3.4*  < > 3.4* 3.2* 3.5 3.5  CL 101 94*  < > 95* 93* 92* 91*  CO2 42* 40*  < > 41* 40* 43* 44*  BUN 44* 39*  < > 29* 29* 28* 28*  CREATININE 1.76* 1.77*  < > 1.50* 1.47* 1.40* 1.41*  CALCIUM 8.8 8.4  < > 8.2* 8.4 8.7 8.6  MG  --   --   --  1.7  --   --   --  PHOS 1.6* 2.8  --  2.0*  --   --   --   GLUCOSE 97 124*  < > 168* 182* 190* 220*  < > = values in this interval not displayed.  CBG (last 3)   Recent Labs  04/23/13 2139 04/24/13 0729 04/24/13 1153  GLUCAP 144* 195* 189*    Scheduled Meds: . antiseptic oral rinse  15 mL Mouth Rinse QID  . bimatoprost  1 drop Both Eyes QHS  . furosemide  60 mg Oral BID  . insulin aspart  2-10 Units Subcutaneous Q4H  . levofloxacin (LEVAQUIN) IV  750 mg Intravenous Q48H  . Levothyroxine Sodium  100 mcg Intravenous Q24H  . nystatin   Topical BID  . pantoprazole (PROTONIX) IV  40 mg Intravenous Daily  . sodium chloride  10-40 mL Intracatheter Q12H  . vancomycin  750 mg Intravenous Q24H    Continuous Infusions: . Marland KitchenTPN (CLINIMIX-E) Adult 60  mL/hr at 04/23/13 1815  . Marland KitchenTPN (CLINIMIX-E) Adult     And  . fat emulsion    . sodium chloride 0.45 % 1,000 mL with potassium chloride 40 mEq infusion      Past Medical History  Diagnosis Date  . Hypertension   . Type 2 diabetes mellitus     Type II  . Atrial fibrillation   . Nephrolithiasis   . Overweight(278.02)   . UTI (urinary tract infection)   . Hypothyroidism (acquired)     Past Surgical History  Procedure Laterality Date  . Thyroidectomy    . Groin dissection Right 04/20/2013    Procedure: GROIN EXPLORATION;  Surgeon: Fabio Bering, MD;  Location: [facility];  Service: General;  Laterality: Right;    Royann Shivers MS,RD,LDN,CSG Office: [phone] Pager: [phone]

## 2013-04-25 LAB — CBC
MCH: 29.1 pg (ref 26.0–34.0)
MCHC: 31.4 g/dL (ref 30.0–36.0)
MCV: 92.6 fL (ref 78.0–100.0)
Platelets: 149 10*3/uL — ABNORMAL LOW (ref 150–400)
RBC: 2.99 MIL/uL — ABNORMAL LOW (ref 3.87–5.11)
RDW: 19.2 % — ABNORMAL HIGH (ref 11.5–15.5)

## 2013-04-25 LAB — BASIC METABOLIC PANEL
CO2: 40 mEq/L (ref 19–32)
Calcium: 8.6 mg/dL (ref 8.4–10.5)
Creatinine, Ser: 1.3 mg/dL — ABNORMAL HIGH (ref 0.50–1.10)

## 2013-04-25 LAB — GLUCOSE, CAPILLARY
Glucose-Capillary: 130 mg/dL — ABNORMAL HIGH (ref 70–99)
Glucose-Capillary: 183 mg/dL — ABNORMAL HIGH (ref 70–99)

## 2013-04-25 MED ORDER — METOPROLOL TARTRATE 25 MG PO TABS
25.0000 mg | ORAL_TABLET | Freq: Two times a day (BID) | ORAL | Status: DC
  Administered 2013-04-25 – 2013-04-27 (×5): 25 mg via ORAL
  Filled 2013-04-25 (×5): qty 1

## 2013-04-25 MED ORDER — POLYETHYLENE GLYCOL 3350 17 G PO PACK
17.0000 g | PACK | Freq: Two times a day (BID) | ORAL | Status: DC
  Administered 2013-04-25 – 2013-04-29 (×9): 17 g via ORAL
  Filled 2013-04-25 (×9): qty 1

## 2013-04-25 MED ORDER — LEVOFLOXACIN 750 MG PO TABS
750.0000 mg | ORAL_TABLET | ORAL | Status: DC
  Administered 2013-04-25 – 2013-04-27 (×2): 750 mg via ORAL
  Filled 2013-04-25 (×2): qty 1

## 2013-04-25 MED ORDER — LEVOTHYROXINE SODIUM 100 MCG PO TABS
200.0000 ug | ORAL_TABLET | Freq: Every day | ORAL | Status: DC
  Administered 2013-04-25 – 2013-04-29 (×5): 200 ug via ORAL
  Filled 2013-04-25 (×5): qty 2

## 2013-04-25 MED ORDER — FUROSEMIDE 40 MG PO TABS
40.0000 mg | ORAL_TABLET | Freq: Every day | ORAL | Status: DC
  Administered 2013-04-26 – 2013-04-28 (×3): 40 mg via ORAL
  Filled 2013-04-25 (×3): qty 1

## 2013-04-25 MED ORDER — PANTOPRAZOLE SODIUM 40 MG PO TBEC
40.0000 mg | DELAYED_RELEASE_TABLET | Freq: Every day | ORAL | Status: DC
  Administered 2013-04-25 – 2013-04-29 (×5): 40 mg via ORAL
  Filled 2013-04-25 (×5): qty 1

## 2013-04-25 NOTE — Progress Notes (Signed)
Subjective: Interval History: Patient presently alert and awake .Patient extubated. Presently denies any nausea or vomiting and no dificutly in breathing.  Objective: Vital signs in last 24 hours: Temp:  [97.7 F (36.5 C)-98 F (36.7 C)] 97.7 F (36.5 C) (07/03 0523) Pulse Rate:  [101-112] 101 (07/03 0523) Resp:  [20] 20 (07/03 0523) BP: (131-157)/(80-85) 157/85 mmHg (07/03 0523) SpO2:  [96 %-97 %] 97 % (07/03 0523) Weight:  [95.845 kg (211 lb 4.8 oz)] 95.845 kg (211 lb 4.8 oz) (07/03 0523) Weight change: -0.953 kg (-2 lb 1.6 oz)  Intake/Output from previous day: 07/02 0701 - 07/03 0700 In: 960 [P.O.:960] Out: 3150 [Urine:3150] Intake/Output this shift:    Generally patient is awake and alert. Does not seem to be in any apparent distress. Chest decreased breath sound bilaterally and anteriorly. She has no rales or rhonchi bilaterally. Heart irregular rate and rhythm no murmur. Her heart rate is controlled Abdomen soft positive bowel sound Extremities no edema   Lab Results:  Recent Labs  04/24/13 1017 04/25/13 0518  WBC 14.3* 12.8*  HGB 9.3* 8.7*  HCT 29.3* 27.7*  PLT 144* 149*   BMET:   Recent Labs  04/24/13 0655 04/24/13 1019  NA 139 138  K 3.5 3.5  CL 92* 91*  CO2 43* 44*  GLUCOSE 190* 220*  BUN 28* 28*  CREATININE 1.40* 1.41*  CALCIUM 8.7 8.6   No results found for this basename: PTH,  in the last 72 hours Iron Studies: No results found for this basename: IRON, TIBC, TRANSFERRIN, FERRITIN,  in the last 72 hours  Studies/Results: Dg Chest Port 1 View  04/24/2013   *RADIOLOGY REPORT*  Clinical Data: Respiratory failure, follow-up  PORTABLE CHEST - 1 VIEW  Comparison: Portable chest x-ray of 04/23/2013  Findings: The lungs appears slightly better aerated.  There is pulmonary vascular congestion remaining,  possibly slightly increased, with basilar atelectasis and/or small effusions. Cardiomegaly is stable.  Left PICC line remains.  IMPRESSION: Minimal  improvement in aeration.  Slight worsening of pulmonary vascular congestion with basilar opacities consistent with atelectasis and/or effusion.   Original Report Authenticated By: Dwyane Dee, M.D.   Dg Chest Port 1 View  04/23/2013   *RADIOLOGY REPORT*  Clinical Data: Respiratory failure  PORTABLE CHEST - 1 VIEW  Comparison: 04/22/2013  Findings: Left PICC line in unchanged position.  The patient has been extubated.  Cardiac silhouette is mildly to moderately enlarged.  There is mild central vascular congestion.  There is mild diffuse interstitial prominence.  There is a left lower lobe retrocardiac opacity which appears stable.  There is opacity in the medial right middle lobe, also unchanged.  IMPRESSION: Cardiac enlargement with vascular congestion and bilateral infiltrates suggesting pulmonary edema in the setting of congestive heart failure.  No significant change status post extubation.   Original Report Authenticated By: Esperanza Heir, M.D.    I have reviewed the patient's current medications.  Assessment/Plan: Problem #1 acute kidney injury superimposed on chronic her BUN and creatinine presently is improving. BUN is 28 and creatine is 1.4. Patient is none oliguric.  Possibly her base line Problem #2 CHF patient on lasix CHF  Is improving Problem #3 hypokalemia related most likely to her diuretics. Her potassium is has low but improving. Presently on pottassuim supplemnet Problem #4 diabetes Problem #5 a trial fibrillation her heart rate is controlled Problem #6 respiratory failure possibly combination of CHF and also pneumonia. Patient is presently remains intubated.  Problem #7 hyperlipidemia Problem #8 obesity  Problem #9 hypertension presently patient blood pressure seems to be low normal. Possibly from bleeding.  Plan continue with her present treatment We'll decrease lasix to 40 mg po once a day     LOS: 11 days   Amber Armstrong S 04/25/2013,9:32 AM

## 2013-04-25 NOTE — Progress Notes (Signed)
Pt O2 removed for 5 minutes  O2 sat 83% on room air  Documented in VS tab

## 2013-04-25 NOTE — Progress Notes (Signed)
The patient is receiving Synthroid & Protonix by the intravenous route.  Based on criteria approved by the Pharmacy and Therapeutics Committee and the Medical Executive Committee, the medication is being converted to the equivalent oral dose form.  These criteria include: -No Active GI bleeding -Able to tolerate diet of full liquids (or better) or tube feeding OR able to tolerate other medications by the oral or enteral route  If you have any questions about this conversion, please contact the Pharmacy Department (ext [phone]).  Thank you.  Elson Clan, [facility] 04/25/2013 8:14 AM

## 2013-04-25 NOTE — Progress Notes (Signed)
Subjective: Patient says she feels "great" Objective: Filed Vitals:   04/24/13 0840 04/24/13 1610 04/24/13 2048 04/25/13 0523  BP:  145/80 131/82 157/85  Pulse: 105 108 112 101  Temp:  97.8 F (36.6 C) 98 F (36.7 C) 97.7 F (36.5 C)  TempSrc:  Oral Oral Oral  Resp:  20 20 20   Height:      Weight:    211 lb 4.8 oz (95.845 kg)  SpO2: 96% 96% 96% 97%   Weight change: -2 lb 1.6 oz (-0.953 kg)  Intake/Output Summary (Last 24 hours) at 04/25/13 0927 Last data filed at 04/25/13 0524  Gross per 24 hour  Intake    840 ml  Output   3150 ml  Net  -2310 ml    General: Alert, awake, oriented x3, in no acute distress Neck:  JVP is normal Heart: Regular rate and rhythm, without murmurs, rubs, gallops.  Lungs: Clear to auscultation.  No rales or wheezes.  Some decreased airflow Exemities:  Tr edema.   Neuro: Grossly intact, nonfocal.  Tele:  Afib 100s Lab Results: Results for orders placed during the hospital encounter of 04/14/13 (from the past 24 hour(s))  CBC     Status: Abnormal   Collection Time    04/24/13 10:17 AM      Result Value Range   WBC 14.3 (*) 4.0 - 10.5 K/uL   RBC 3.17 (*) 3.87 - 5.11 MIL/uL   Hemoglobin 9.3 (*) 12.0 - 15.0 g/dL   HCT 40.9 (*) 81.1 - 91.4 %   MCV 92.4  78.0 - 100.0 fL   MCH 29.3  26.0 - 34.0 pg   MCHC 31.7  30.0 - 36.0 g/dL   RDW 78.2 (*) 95.6 - 21.3 %   Platelets 144 (*) 150 - 400 K/uL  BASIC METABOLIC PANEL     Status: Abnormal   Collection Time    04/24/13 10:19 AM      Result Value Range   Sodium 138  135 - 145 mEq/L   Potassium 3.5  3.5 - 5.1 mEq/L   Chloride 91 (*) 96 - 112 mEq/L   CO2 44 (*) 19 - 32 mEq/L   Glucose, Bld 220 (*) 70 - 99 mg/dL   BUN 28 (*) 6 - 23 mg/dL   Creatinine, Ser 0.86 (*) 0.50 - 1.10 mg/dL   Calcium 8.6  8.4 - 57.8 mg/dL   GFR calc non Af Amer 39 (*) >90 mL/min   GFR calc Af Amer 45 (*) >90 mL/min  GLUCOSE, CAPILLARY     Status: Abnormal   Collection Time    04/24/13 11:53 AM      Result Value Range   Glucose-Capillary 189 (*) 70 - 99 mg/dL   Comment 1 Notify RN    GLUCOSE, CAPILLARY     Status: Abnormal   Collection Time    04/24/13  5:07 PM      Result Value Range   Glucose-Capillary 202 (*) 70 - 99 mg/dL   Comment 1 Notify RN    GLUCOSE, CAPILLARY     Status: Abnormal   Collection Time    04/24/13  8:35 PM      Result Value Range   Glucose-Capillary 197 (*) 70 - 99 mg/dL   Comment 1 Notify RN     Comment 2 Documented in Chart    PROTIME-INR     Status: None   Collection Time    04/25/13  5:18 AM      Result Value  Range   Prothrombin Time 14.2  11.6 - 15.2 seconds   INR 1.12  0.00 - 1.49  CBC     Status: Abnormal   Collection Time    04/25/13  5:18 AM      Result Value Range   WBC 12.8 (*) 4.0 - 10.5 K/uL   RBC 2.99 (*) 3.87 - 5.11 MIL/uL   Hemoglobin 8.7 (*) 12.0 - 15.0 g/dL   HCT 19.1 (*) 47.8 - 29.5 %   MCV 92.6  78.0 - 100.0 fL   MCH 29.1  26.0 - 34.0 pg   MCHC 31.4  30.0 - 36.0 g/dL   RDW 62.1 (*) 30.8 - 65.7 %   Platelets 149 (*) 150 - 400 K/uL  GLUCOSE, CAPILLARY     Status: Abnormal   Collection Time    04/25/13  7:50 AM      Result Value Range   Glucose-Capillary 216 (*) 70 - 99 mg/dL   Comment 1 Notify RN      Studies/Results: @RISRSLT24 @  Medications: reviewed   @PROBHOS   1.  Cor pulmonale  Patient has diuresed significantly from Monday when I sw her last.  Still with some increased fluid on exam.   Would check OK saturation Check labs in AM  2.  Afib Rates still a little high  I would recomm low dose metoprolol to slow  I do not think it will have signif impact on breathing.  3.  HTN  As above.  LOS: 11 days   Dietrich Pates 04/25/2013, 9:27 AM

## 2013-04-25 NOTE — Progress Notes (Signed)
Physical Therapy Treatment Patient Details Name: Amber Armstrong MRN: [id number] DOB: 11/18/1950 Today's Date: 04/25/2013 Time: 4098-1191 PT Time Calculation (min): 37 min  PT Assessment / Plan / Recommendation  PT Comments   Pt's general strength and endurance are improving.  She is able to tolerate both sitting and supine exercise (very gentle on RLE due to groin wound) and was able to ambulate with a walker 20' with minimal assistance.  Pt remains very cooperative and well motivated.  Follow Up Recommendations        Does the patient have the potential to tolerate intense rehabilitation     Barriers to Discharge        Equipment Recommendations       Recommendations for Other Services    Frequency     Progress towards PT Goals Progress towards PT goals: Progressing toward goals  Plan Current plan remains appropriate    Precautions / Restrictions Precautions Precautions: Fall   Pertinent Vitals/Pain     Mobility  Bed Mobility Bed Mobility: Sit to Supine Sit to Supine: 4: Min assist Details for Bed Mobility Assistance: needs min assist to move RLE due to groin wound Transfers Sit to Stand: 5: Supervision;From chair/3-in-1;With upper extremity assist Stand to Sit: 5: Supervision;With upper extremity assist;To chair/3-in-1 Ambulation/Gait Ambulation/Gait Assistance: 4: Min assist Ambulation Distance (Feet): 20 Feet Assistive device: Rolling walker Ambulation/Gait Assistance Details: on 2 L O2 ...is ready to go onto portable O2 for increasing distance into hallway Stairs: No Wheelchair Mobility Wheelchair Mobility: No    Exercises General Exercises - Upper Extremity Shoulder Flexion: Strengthening;10 reps;Seated;Theraband;Both Theraband Level (Shoulder Flexion): Level 2 (Red) Shoulder Extension: Strengthening;Both;10 reps;Seated;Theraband Theraband Level (Shoulder Extension): Level 2 (Red) Shoulder Horizontal ABduction: Strengthening;Both;10  reps;Seated;Theraband Theraband Level (Shoulder Horizontal Abduction): Level 2 (Red) Shoulder Horizontal ADduction: Strengthening;Both;10 reps;Seated;Theraband Theraband Level (Shoulder Horizontal Adduction): Level 2 (Red) Elbow Flexion: Strengthening;Both;10 reps;Seated;Theraband Theraband Level (Elbow Flexion): Level 2 (Red) Elbow Extension: Strengthening;Both;10 reps;Seated;Theraband Theraband Level (Elbow Extension): Level 2 (Red) General Exercises - Lower Extremity Ankle Circles/Pumps: AROM;Both;10 reps;Supine Quad Sets: AROM;Both;10 reps;Supine Gluteal Sets: AROM;Both;10 reps;Supine Short Arc Quad: AROM;Both;10 reps;Supine Long Arc Quad: AROM;Both;20 reps;Seated Heel Slides: AROM;Left;10 reps;Supine Hip ABduction/ADduction: AROM;Left;10 reps;Supine Hip Flexion/Marching: AROM;Both;10 reps;Seated   PT Diagnosis:    PT Problem List:   PT Treatment Interventions:     PT Goals (current goals can now be found in the care plan section)    Visit Information  Last PT Received On: 04/25/13 Assistance Needed: +1    Subjective Data      Cognition  Cognition Arousal/Alertness: Awake/alert Behavior During Therapy: WFL for tasks assessed/performed Overall Cognitive Status: Within Functional Limits for tasks assessed    Balance     End of Session PT - End of Session Equipment Utilized During Treatment: Gait belt;Oxygen Activity Tolerance: Patient tolerated treatment well Patient left: in bed;with call bell/phone within reach;with nursing/sitter in room Nurse Communication: Mobility status   GP     Konrad Penta 04/25/2013, 4:18 PM

## 2013-04-25 NOTE — Progress Notes (Signed)
[facility] PROGRESS NOTE  Amber Armstrong ZOX:[id number] DOB: 22-Nov-1950 DOA: 04/14/2013 PCP: Pershing Proud  Brief Narrative/Interim Summary  This lady was admitted to the hospital with shortness of breath due to CHF exacerbation.  She was started on intravenous lasix but did not have any significant response.  On 6/25 patient was found to be unresponsive and had a significant respiratory acidosis with a pco2 of 125 and pH of 7.06.  She was transferred to the ICU and intubated.  She was followed by cardiology for CHF, pulmonology for ventilator management and nephrology for acute renal failure.  Patient was found to be hypotensive, so femoral central line was placed by general surgery for vasopressor support to aide with diuresis.  Patient began to experience good response to lasix and overall condition was starting to improve. Creatinine also began to improve with diuresis.  Patient began to experience thick secretions from ET tube, and chest xray raised concerns for possible developing pneumonia.  She was started empirically on vancomycin and levofloxacin. Patient subsequently had a picc line placed for continued central access.  Once Picc line was placed, femoral central line was discontinued.  Unfortunately, patient had significant bleeding from right groin and developed a large right groin hematoma with pseudoaneurysm.  This was also complicated by the fact that patient was on full dose lovenox for atrial fibrillation. This eventually required operative repair by Dr. Leticia Penna.  Patient was transfused multiple units of FFP and PRBC for bleeding and anemia.  At this time, it appears that her bleeding has resolved.  By 7/1 morning, patient was able to be successfully weaned off the ventilator. Patient on the floor since 7/1, weaned off TPN. Lasix transitioned to po on 7/2.   Assessment/Plan:  Acute on chronic respiratory failure, multifactorial. Patient extubated off the ventilator 7/1.  ventilator. Continuing diuresis, transitioned to po Lasix 7/2 per renal.  - still on O2, wean off as tolerated Acute right-sided congestive heart failure. Edema improving. Continue diuresis and appreciate cardiology help HCAP - on antibiotics since 6/25. Plan for 10 days, last day tomorrow.  Acute renal failure. Creatinine continued to improve, Nephrology is following.  Continue to monitor urine output Atrial fibrillation. Patient is having episodes of intermittent tachycardia. Will need to be cautious with beta blockers since patient is prone to hypotension. Anticoagulation has been discontinued due to bleeding from right groin site Hemorrhagic shock. Due to bleeding from right groin site. Blood pressure better. Status post 5 units packed red blood cells Acute blood loss anemia. Due to bleeding from right groin site. Patient received 5 units of PRBCs and 2 units of FFP. Hemoglobin unchanged Type 2 diabetes. Blood sugars have been stable. Hypokalemia: Replace as indicated Acute right groin hematoma with pseudoaneurysm. Patient had significant bleeding from right groin when her femoral central line was discontinued yesterday, complicated by the fact that she was anticoagulated with Lovenox. Patient received protamine as well as FFP to reverse her coagulopathy. She had significant bleeding requiring transfusion of PRBCs and FFPs. Ultimately, she required surgical repair by Dr. Leticia Penna to achieve hemostasis. Stable today. Nutrition: started oral intake, d/c TPN   Code Status: Patient was made DO NOT RESUSCITATE by Dr. Karilyn Cota.; Family Communication: Discussed with patient's mother bedside Disposition Plan:  Likely will need short-term skilled nursing for rehabilitation; plan for Monday likely since holiday weekend is coming and she is not ready today.    Consultants:  [facility] cardiology  Nephrology, Dr. Kristian Covey  General surgery, Dr. Leticia Penna  Pulmonary, Dr.  Hawkins  Procedures:  Intubation on 6/25, extubated 7/1  Central line placement on 6/25, by Dr. Leticia Penna  Right groin exploration, suture repair of right femoral vein, suture repair artery, right radial arterial line placement 6/28 by Dr. Leticia Penna  Antibiotics: Vancomycin 6/25 >> Levofloxacin 6/25 >>  HPI/Subjective: - no complaints, this morning, was able to ambulate to bathroom and back with help.   Objective: Filed Vitals:   04/24/13 0840 04/24/13 1610 04/24/13 2048 04/25/13 0523  BP:  145/80 131/82 157/85  Pulse: 105 108 112 101  Temp:  97.8 F (36.6 C) 98 F (36.7 C) 97.7 F (36.5 C)  TempSrc:  Oral Oral Oral  Resp:  20 20 20   Height:      Weight:    95.845 kg (211 lb 4.8 oz)  SpO2: 96% 96% 96% 97%    Intake/Output Summary (Last 24 hours) at 04/25/13 0847 Last data filed at 04/25/13 0524  Gross per 24 hour  Intake    840 ml  Output   3150 ml  Net  -2310 ml   Filed Weights   04/24/13 0410 04/24/13 0457 04/25/13 0523  Weight: 96.798 kg (213 lb 6.4 oz) 95.4 kg (210 lb 5.1 oz) 95.845 kg (211 lb 4.8 oz)    Exam:   General:  NAD, awake and no acute distress  Cardiovascular: Irregular rhythm, rate controlled  Respiratory: Occasional rhonchi, mostly clear  Abdomen: soft, nt, nd, bs+  Musculoskeletal:trace edema b/l in lower extremities, right groin tender to palpation, ecchymotic. Right arm with large ecchymosis.    Data Reviewed: Basic Metabolic Panel:  Recent Labs Lab 04/19/13 0416  04/22/13 0444 04/23/13 0451 04/23/13 1035 04/24/13 0655 04/24/13 1019  NA 143  < > 142 139 138 139 138  K 3.4*  < > 3.0* 3.4* 3.2* 3.5 3.5  CL 94*  < > 95* 95* 93* 92* 91*  CO2 40*  < > 39* 41* 40* 43* 44*  GLUCOSE 124*  < > 116* 168* 182* 190* 220*  BUN 39*  < > 33* 29* 29* 28* 28*  CREATININE 1.77*  < > 1.74* 1.50* 1.47* 1.40* 1.41*  CALCIUM 8.4  < > 8.1* 8.2* 8.4 8.7 8.6  MG  --   --   --  1.7  --   --   --   PHOS 2.8  --   --  2.0*  --   --   --   < > =  values in this interval not displayed. Liver Function Tests:  Recent Labs Lab 04/23/13 0451  AST 18  ALT 10  ALKPHOS 67  BILITOT 1.2  PROT 5.3*  ALBUMIN 2.3*   CBC:  Recent Labs Lab 04/23/13 0451  04/23/13 1742 04/24/13 0125 04/24/13 0456 04/24/13 1017 04/25/13 0518  WBC 13.2*  < > 15.4* 12.9* 12.6* 14.3* 12.8*  NEUTROABS 9.7*  --   --   --   --   --   --   HGB 8.1*  < > 8.9* 8.2* 8.0* 9.3* 8.7*  HCT 24.6*  < > 28.2* 27.9* 28.7* 29.3* 27.7*  MCV 89.8  < > 91.3 101.8* 106.7* 92.4 92.6  PLT 105*  < > 127* 114* 118* 144* 149*  < > = values in this interval not displayed. BNP (last 3 results)  Recent Labs  04/14/13 1030  PROBNP 12159.0*   CBG:  Recent Labs Lab 04/24/13 0729 04/24/13 1153 04/24/13 1707 04/24/13 2035 04/25/13 0750  GLUCAP 195* 189* 202* 197* 216*  Studies: Dg Chest Port 1 View  04/22/2013  IMPRESSION:  1.  Stable support apparatus. 2.  Shifting bilateral airspace disease most compatible with pulmonary edema.  Superimposed basilar atelectasis and shifting pleural effusions.   Original Report Authenticated By: Andreas Newport, M.D.   Dg Chest Port 1 View  04/21/2013    IMPRESSION: Worsening edema throughout the left lung compared to the preceding study.   Original Report Authenticated By: Sander Radon, M.D.   Scheduled Meds: . antiseptic oral rinse  15 mL Mouth Rinse QID  . bimatoprost  1 drop Both Eyes QHS  . feeding supplement  237 mL Oral BID BM  . furosemide  60 mg Oral BID  . insulin aspart  0-15 Units Subcutaneous TID WC  . insulin aspart  0-15 Units Subcutaneous TID WC  . insulin aspart  0-5 Units Subcutaneous QHS  . levofloxacin  750 mg Oral Q48H  . levothyroxine  200 mcg Oral QAC breakfast  . multivitamin with minerals  1 tablet Oral Daily  . nystatin   Topical BID  . pantoprazole  40 mg Oral Daily  . polyethylene glycol  17 g Oral BID  . sodium chloride  10-40 mL Intracatheter Q12H  . vancomycin  750 mg Intravenous Q24H    Continuous Infusions:    Active Problems:   DIABETES MELLITUS, TYPE II   HYPERLIPIDEMIA   Morbid obesity with BMI of 40.0-44.9, adult   HYPERTENSION   FIBRILLATION, ATRIAL   Long term current use of anticoagulant   CHF exacerbation   Acute respiratory failure   Acute renal failure   Acute diastolic heart failure   Hematoma of groin   Pseudoaneurysm   Acute blood loss anemia   Hemorrhagic shock  Time spent: 35 mins  Pamella Pert  [facility] Pager [phone]. If 7PM-7AM, please contact night-coverage at [email or web address], password [facility] 04/25/2013, 8:47 AM  LOS: 11 days

## 2013-04-25 NOTE — Progress Notes (Signed)
Occupational Therapy Treatment Patient Details Name: ALEJAH ARISTIZABAL MRN: [id number] DOB: [birth year] Today's Date: 04/25/2013 Time: 9147-8295 OT Time Calculation (min): 14 min  OT Assessment / Plan / Recommendation  OT comments  I feel much better today. My right shoulder feels real good.                  Progress towards OT Goals Progress towards OT goals: Progressing toward goals  Plan Discharge plan remains appropriate;Frequency remains appropriate    Precautions / Restrictions Precautions Precautions: Fall Restrictions Weight Bearing Restrictions: No   Pertinent Vitals/Pain Slight soreness in right shoulder.      OT Goals(current goals can now be found in the care plan section)    Visit Information  Last OT Received On: 04/25/13 Assistance Needed: +1    Subjective Data   S: I wish I was going to [facility] today.      Cognition  Cognition Arousal/Alertness: Awake/alert Behavior During Therapy: WFL for tasks assessed/performed Overall Cognitive Status: Within Functional Limits for tasks assessed       Exercises  General Exercises - Upper Extremity Shoulder Flexion: Strengthening;10 reps;Seated;Theraband;Both Theraband Level (Shoulder Flexion): Level 2 (Red) Shoulder Extension: Strengthening;Both;10 reps;Seated;Theraband Theraband Level (Shoulder Extension): Level 2 (Red) Shoulder Horizontal ABduction: Strengthening;Both;10 reps;Seated;Theraband Theraband Level (Shoulder Horizontal Abduction): Level 2 (Red) Shoulder Horizontal ADduction: Strengthening;Both;10 reps;Seated;Theraband Theraband Level (Shoulder Horizontal Adduction): Level 2 (Red) Elbow Flexion: Strengthening;Both;10 reps;Seated;Theraband Theraband Level (Elbow Flexion): Level 2 (Red) Elbow Extension: Strengthening;Both;10 reps;Seated;Theraband Theraband Level (Elbow Extension): Level 2 (Red)      End of Session OT - End of Session Activity Tolerance: Patient tolerated treatment well Patient left:  in chair;with call bell/phone within reach;with nursing/sitter in room;with family/visitor present   Limmie Patricia, OTR/L,CBIS   04/25/2013, 1:44 PM

## 2013-04-25 NOTE — Progress Notes (Signed)
5 Days Post-Op  Subjective: No acute change.  Objective: Vital signs in last 24 hours: Temp:  [97.7 F (36.5 C)-98 F (36.7 C)] 98 F (36.7 C) (07/03 2124) Pulse Rate:  [95-121] 95 (07/03 2124) Resp:  [20] 20 (07/03 2124) BP: (125-165)/(78-113) 125/78 mmHg (07/03 2124) SpO2:  [83 %-99 %] 95 % (07/03 2146) Weight:  [95.845 kg (211 lb 4.8 oz)] 95.845 kg (211 lb 4.8 oz) (07/03 0523) Last BM Date: 04/14/13  Intake/Output from previous day: 07/02 0701 - 07/03 0700 In: 960 [P.O.:960] Out: 3150 [Urine:3150] Intake/Output this shift: Total I/O In: -  Out: 300 [Urine:300]  Extremities: warm..  + distal pulses.  Lab Results:   Recent Labs  04/24/13 1017 04/25/13 0518  WBC 14.3* 12.8*  HGB 9.3* 8.7*  HCT 29.3* 27.7*  PLT 144* 149*   BMET  Recent Labs  04/24/13 1019 04/25/13 1544  NA 138 135  K 3.5 3.5  CL 91* 90*  CO2 44* 40*  GLUCOSE 220* 213*  BUN 28* 29*  CREATININE 1.41* 1.30*  CALCIUM 8.6 8.6   PT/INR  Recent Labs  04/24/13 0456 04/25/13 0518  LABPROT 15.5* 14.2  INR 1.26 1.12   ABG  Recent Labs  04/23/13 0805 04/24/13 0855  PHART 7.469* 7.462*  HCO3 38.4* 41.3*    Studies/Results: Dg Chest Port 1 View  04/24/2013   *RADIOLOGY REPORT*  Clinical Data: Respiratory failure, follow-up  PORTABLE CHEST - 1 VIEW  Comparison: Portable chest x-ray of 04/23/2013  Findings: The lungs appears slightly better aerated.  There is pulmonary vascular congestion remaining,  possibly slightly increased, with basilar atelectasis and/or small effusions. Cardiomegaly is stable.  Left PICC line remains.  IMPRESSION: Minimal improvement in aeration.  Slight worsening of pulmonary vascular congestion with basilar opacities consistent with atelectasis and/or effusion.   Original Report Authenticated By: Dwyane Dee, M.D.    Anti-infectives: Anti-infectives   Start     Dose/Rate Route Frequency Ordered Stop   04/25/13 1000  levofloxacin (LEVAQUIN) tablet 750 mg     750  mg Oral Every 48 hours 04/25/13 0842     04/24/13 1600  vancomycin (VANCOCIN) IVPB 750 mg/150 ml premix     750 mg 150 mL/hr over 60 Minutes Intravenous Every 24 hours 04/24/13 0746     04/21/13 0600  vancomycin (VANCOCIN) IVPB 1000 mg/200 mL premix  Status:  Discontinued     1,000 mg 200 mL/hr over 60 Minutes Intravenous Every 24 hours 04/20/13 1012 04/24/13 0746   04/17/13 1000  vancomycin (VANCOCIN) 1,500 mg in sodium chloride 0.9 % 500 mL IVPB  Status:  Discontinued     1,500 mg 250 mL/hr over 120 Minutes Intravenous Every 24 hours 04/17/13 0807 04/20/13 1012   04/17/13 0900  levofloxacin (LEVAQUIN) IVPB 750 mg  Status:  Discontinued     750 mg 100 mL/hr over 90 Minutes Intravenous Every 48 hours 04/17/13 0807 04/25/13 0842   04/17/13 0800  levofloxacin (LEVAQUIN) IVPB 500 mg  Status:  Discontinued     500 mg 100 mL/hr over 60 Minutes Intravenous Every 24 hours 04/17/13 0747 04/17/13 0807      Assessment/Plan: s/p Procedure(s): GROIN EXPLORATION (Right) Doing well.  No acute surgical findings.  Available as needed.    LOS: 11 days    Lekisha Mcghee C 04/25/2013

## 2013-04-25 NOTE — Progress Notes (Signed)
She is apparently set for transfer to skilled care facility fairly soon. I will plan to sign off at this time. Thank you very much for allowing me to see her with you

## 2013-04-25 NOTE — Clinical Social Work Note (Signed)
Spoke with Eunice Blase at [facility] who reports insurance authorization received for SNF for possible weekend admission.   Amber Armstrong, [place] [phone]

## 2013-04-25 NOTE — Progress Notes (Signed)
PHARMACIST - PHYSICIAN COMMUNICATION DR:   Elvera Lennox CONCERNING: Antibiotic IV to Oral Route Change Policy  RECOMMENDATION: This patient is receiving Levaquin by the intravenous route.  Based on criteria approved by the Pharmacy and Therapeutics Committee, the antibiotic(s) is/are being converted to the equivalent oral dose form(s).   DESCRIPTION: These criteria include:  Patient being treated for a respiratory tract infection, urinary tract infection, or cellulitis  The patient is not neutropenic and does not exhibit a GI malabsorption state  The patient is eating (either orally or via tube) and/or has been taking other orally administered medications for a least 24 hours  The patient is improving clinically and has a Tmax < 100.5  If you have questions about this conversion, please contact the Pharmacy Department  [x]   [phone] )  [facility] []   [phone] )  [facility]  []   [phone] )  [facility] []   [phone] )  Ilene Qua   Junita Push, PharmD, BCPS .

## 2013-04-25 NOTE — Progress Notes (Signed)
Nutrition Follow-up  INTERVENTION:  Pureed diet per pt preference due to sore mouth related to recent multiple tooth extractions  Continue Glucerna Shake po BID, each supplement provides 220 kcal and 10 grams of protein and  MVI daily  RD will continue to follow nutrition care  NUTRITION DIAGNOSIS: Inadequate oral intake;improving  Goal: Pt will successfully transition from TPN  to meet > 90% of her nutritional needs via oral intake; progressing.  Monitor:  Nutrition care plan progression  62 y.o. female  ASSESSMENT: Pt eating well 75-100% of meals and >75% of Glucerna per pt. She is planning to discharge to [facility]. Recommend oral nutrition supplement BID be part of her discharge medications.  Height: Ht Readings from Last 1 Encounters:  04/22/13 5' (1.524 m)    Weight: Wt Readings from Last 1 Encounters:  04/25/13 211 lb 4.8 oz (95.845 kg)    Ideal Body Weight: 100# (45.4 kg)  % Ideal Body Weight: 219%  Wt Readings from Last 10 Encounters:  04/25/13 211 lb 4.8 oz (95.845 kg)  04/25/13 211 lb 4.8 oz (95.845 kg)  12/12/12 202 lb 1.3 oz (91.663 kg)  04/11/12 204 lb (92.534 kg)  03/28/12 205 lb (92.987 kg)  04/19/11 201 lb (91.173 kg)  09/23/10 201 lb (91.173 kg)  06/23/10 201 lb (91.173 kg)  06/14/10 199 lb (90.266 kg)  12/10/09 206 lb (93.441 kg)    Usual Body Weight: 200-205#  % Usual Body Weight: 109%  BMI:  Body mass index is 41.27 kg/(m^2). Extreme Obesity Class III (skewed due to pt fluid status)  Estimated Nutritional Needs: Kcal: 1200-1500 Protein: 90-112 gr Fluid: per MD goals  Skin: No issues noted  Diet Order: Carb Control  EDUCATION NEEDS: -No education needs identified at this time   Intake/Output Summary (Last 24 hours) at 04/25/13 1127 Last data filed at 04/25/13 1037  Gross per 24 hour  Intake   1240 ml  Output   2250 ml  Net  -1010 ml    Last BM: 04/14/13  Labs:   Recent Labs Lab 04/19/13 0416  04/23/13 0451  04/23/13 1035 04/24/13 0655 04/24/13 1019  NA 143  < > 139 138 139 138  K 3.4*  < > 3.4* 3.2* 3.5 3.5  CL 94*  < > 95* 93* 92* 91*  CO2 40*  < > 41* 40* 43* 44*  BUN 39*  < > 29* 29* 28* 28*  CREATININE 1.77*  < > 1.50* 1.47* 1.40* 1.41*  CALCIUM 8.4  < > 8.2* 8.4 8.7 8.6  MG  --   --  1.7  --   --   --   PHOS 2.8  --  2.0*  --   --   --   GLUCOSE 124*  < > 168* 182* 190* 220*  < > = values in this interval not displayed.  CBG (last 3)   Recent Labs  04/24/13 1707 04/24/13 2035 04/25/13 0750  GLUCAP 202* 197* 216*    Scheduled Meds: . antiseptic oral rinse  15 mL Mouth Rinse QID  . bimatoprost  1 drop Both Eyes QHS  . feeding supplement  237 mL Oral BID BM  . [START ON 04/26/2013] furosemide  40 mg Oral Daily  . insulin aspart  0-15 Units Subcutaneous TID WC  . insulin aspart  0-15 Units Subcutaneous TID WC  . insulin aspart  0-5 Units Subcutaneous QHS  . levofloxacin  750 mg Oral Q48H  . levothyroxine  200 mcg Oral QAC breakfast  .  multivitamin with minerals  1 tablet Oral Daily  . nystatin   Topical BID  . pantoprazole  40 mg Oral Daily  . polyethylene glycol  17 g Oral BID  . sodium chloride  10-40 mL Intracatheter Q12H  . vancomycin  750 mg Intravenous Q24H    Continuous Infusions:    Past Medical History  Diagnosis Date  . Hypertension   . Type 2 diabetes mellitus     Type II  . Atrial fibrillation   . Nephrolithiasis   . Overweight(278.02)   . UTI (urinary tract infection)   . Hypothyroidism (acquired)     Past Surgical History  Procedure Laterality Date  . Thyroidectomy    . Groin dissection Right 04/20/2013    Procedure: GROIN EXPLORATION;  Surgeon: Fabio Bering, MD;  Location: [facility];  Service: General;  Laterality: Right;    Royann Shivers MS,RD,LDN,CSG Office: [phone] Pager: [phone]

## 2013-04-25 NOTE — Clinical Social Work Note (Signed)
CSW spoke w patient, is agreeable to placement at [facility].  Family has spoken w facility business office and are aware and agreeable to financial arrangements.  Debbie from [facility] confirms plan.  Santa Genera, LCSW Clinical Social Worker [phone])

## 2013-04-26 LAB — GLUCOSE, CAPILLARY: Glucose-Capillary: 133 mg/dL — ABNORMAL HIGH (ref 70–99)

## 2013-04-26 LAB — CBC
Hemoglobin: 8.2 g/dL — ABNORMAL LOW (ref 12.0–15.0)
MCH: 29.1 pg (ref 26.0–34.0)
MCV: 92.9 fL (ref 78.0–100.0)
RBC: 2.82 MIL/uL — ABNORMAL LOW (ref 3.87–5.11)

## 2013-04-26 LAB — BASIC METABOLIC PANEL
CO2: 39 mEq/L — ABNORMAL HIGH (ref 19–32)
Glucose, Bld: 185 mg/dL — ABNORMAL HIGH (ref 70–99)
Potassium: 3.6 mEq/L (ref 3.5–5.1)
Sodium: 138 mEq/L (ref 135–145)

## 2013-04-26 MED ORDER — BIOTENE DRY MOUTH MT LIQD
15.0000 mL | Freq: Two times a day (BID) | OROMUCOSAL | Status: DC
  Administered 2013-04-26 – 2013-04-29 (×4): 15 mL via OROMUCOSAL

## 2013-04-26 NOTE — Progress Notes (Signed)
[facility] PROGRESS NOTE  Amber Armstrong ZOX:[id number] DOB: 1951/01/16 DOA: 04/14/2013 PCP: Pershing Proud  Brief Narrative/Interim Summary  This lady was admitted to the hospital with shortness of breath due to CHF exacerbation.  She was started on intravenous lasix but did not have any significant response.  On 6/25 patient was found to be unresponsive and had a significant respiratory acidosis with a pco2 of 125 and pH of 7.06.  She was transferred to the ICU and intubated.  She was followed by cardiology for CHF, pulmonology for ventilator management and nephrology for acute renal failure.  Patient was found to be hypotensive, so femoral central line was placed by general surgery for vasopressor support to aide with diuresis.  Patient began to experience good response to lasix and overall condition was starting to improve. Creatinine also began to improve with diuresis.  Patient began to experience thick secretions from ET tube, and chest xray raised concerns for possible developing pneumonia.  She was started empirically on vancomycin and levofloxacin. Patient subsequently had a picc line placed for continued central access.  Once Picc line was placed, femoral central line was discontinued.  Unfortunately, patient had significant bleeding from right groin and developed a large right groin hematoma with pseudoaneurysm.  This was also complicated by the fact that patient was on full dose lovenox for atrial fibrillation. This eventually required operative repair by Dr. Leticia Penna.  Patient was transfused multiple units of FFP and PRBC for bleeding and anemia.  At this time, it appears that her bleeding has resolved.  By 7/1 morning, patient was able to be successfully weaned off the ventilator. Patient on the floor since 7/1, weaned off TPN. Lasix transitioned to po on 7/2.   Assessment/Plan:  Acute on chronic respiratory failure, multifactorial. Patient extubated off the ventilator 7/1.  ventilator. Continuing diuresis, transitioned to po Lasix 7/2 per renal.  - still on O2, wean off as tolerated - encourage ambulation with PT Acute right-sided congestive heart failure. Edema improving. Continue diuresis and appreciate cardiology help HCAP - on antibiotics since 6/25. Plan for 10 days, last day today, to be discontinued tomorrow.  Acute renal failure. Creatinine continued to improve, Nephrology is following.  Continue to monitor urine output Atrial fibrillation. Patient is having episodes of intermittent tachycardia. Will need to be cautious with beta blockers since patient is prone to hypotension. Anticoagulation has been discontinued due to bleeding from right groin site - BB started 7/3, patient with improved heart rates and stable BP. Monitor. Hemorrhagic shock. Due to bleeding from right groin site. Blood pressure better. Status post 5 units packed red blood cells Acute blood loss anemia. Due to bleeding from right groin site. Patient received 5 units of PRBCs and 2 units of FFP. Hemoglobin unchanged Type 2 diabetes. Blood sugars have been stable. Hypokalemia: Replace as indicated Acute right groin hematoma with pseudoaneurysm. Patient had significant bleeding from right groin when her femoral central line was discontinued yesterday, complicated by the fact that she was anticoagulated with Lovenox. Patient received protamine as well as FFP to reverse her coagulopathy. She had significant bleeding requiring transfusion of PRBCs and FFPs. Ultimately, she required surgical repair by Dr. Leticia Penna to achieve hemostasis. Stable today. - surgery following, stable.  Nutrition: started oral intake, d/c TPN - appreciate nutrition help   Code Status: Patient was made DO NOT RESUSCITATE by Dr. Karilyn Cota.; Family Communication: Discussed with patient's mother bedside Disposition Plan:  Likely will need short-term skilled nursing for rehabilitation; plan for Sunday  if stable.     Consultants:  [facility] cardiology  Nephrology, Dr. Kristian Covey  General surgery, Dr. Leticia Penna  Pulmonary, Dr. Juanetta Gosling  Procedures:  Intubation on 6/25, extubated 7/1  Central line placement on 6/25, by Dr. Leticia Penna  Right groin exploration, suture repair of right femoral vein, suture repair artery, right radial arterial line placement 6/28 by Dr. Leticia Penna  Antibiotics: Vancomycin 6/25 >> Levofloxacin 6/25 >>  HPI/Subjective: - no complaints, feels well  Objective: Filed Vitals:   04/25/13 1746 04/25/13 2124 04/25/13 2146 04/26/13 0429  BP: 138/87 125/78  120/80  Pulse: 96 95  92  Temp:  98 F (36.7 C)  97.9 F (36.6 C)  TempSrc:  Oral  Oral  Resp: 20 20  20   Height:      Weight:    94.756 kg (208 lb 14.4 oz)  SpO2: 99% 98% 95% 97%    Intake/Output Summary (Last 24 hours) at 04/26/13 0833 Last data filed at 04/26/13 0500  Gross per 24 hour  Intake   1070 ml  Output   1000 ml  Net     70 ml   Filed Weights   04/24/13 0457 04/25/13 0523 04/26/13 0429  Weight: 95.4 kg (210 lb 5.1 oz) 95.845 kg (211 lb 4.8 oz) 94.756 kg (208 lb 14.4 oz)    Exam:   General:  NAD, awake and no acute distress  Cardiovascular: Irregular rhythm, rate controlled  Respiratory: Occasional rhonchi, mostly clear  Abdomen: soft, nt, nd, bs+  Musculoskeletal:trace edema b/l in lower extremities, right groin tender to palpation, ecchymotic; dressing intact without bleeding. Right arm with large ecchymosis.    Data Reviewed: Basic Metabolic Panel:  Recent Labs Lab 04/23/13 0451 04/23/13 1035 04/24/13 0655 04/24/13 1019 04/25/13 1544 04/26/13 0500  NA 139 138 139 138 135 138  K 3.4* 3.2* 3.5 3.5 3.5 3.6  CL 95* 93* 92* 91* 90* 92*  CO2 41* 40* 43* 44* 40* 39*  GLUCOSE 168* 182* 190* 220* 213* 185*  BUN 29* 29* 28* 28* 29* 29*  CREATININE 1.50* 1.47* 1.40* 1.41* 1.30* 1.34*  CALCIUM 8.2* 8.4 8.7 8.6 8.6 8.3*  MG 1.7  --   --   --   --   --   PHOS 2.0*  --   --   --   --    --    Liver Function Tests:  Recent Labs Lab 04/23/13 0451  AST 18  ALT 10  ALKPHOS 67  BILITOT 1.2  PROT 5.3*  ALBUMIN 2.3*   CBC:  Recent Labs Lab 04/23/13 0451  04/24/13 0125 04/24/13 0456 04/24/13 1017 04/25/13 0518 04/26/13 0500  WBC 13.2*  < > 12.9* 12.6* 14.3* 12.8* 12.1*  NEUTROABS 9.7*  --   --   --   --   --   --   HGB 8.1*  < > 8.2* 8.0* 9.3* 8.7* 8.2*  HCT 24.6*  < > 27.9* 28.7* 29.3* 27.7* 26.2*  MCV 89.8  < > 101.8* 106.7* 92.4 92.6 92.9  PLT 105*  < > 114* 118* 144* 149* 168  < > = values in this interval not displayed. BNP (last 3 results)  Recent Labs  04/14/13 1030 04/26/13 0500  PROBNP 12159.0* 7558.0*   CBG:  Recent Labs Lab 04/25/13 1126 04/25/13 1401 04/25/13 1636 04/25/13 2157 04/26/13 0700  GLUCAP 130* 168* 183* 137* 155*   Studies: Dg Chest Port 1 View  04/22/2013  IMPRESSION:  1.  Stable support apparatus. 2.  Shifting bilateral  airspace disease most compatible with pulmonary edema.  Superimposed basilar atelectasis and shifting pleural effusions.   Original Report Authenticated By: Andreas Newport, M.D.   Dg Chest Port 1 View  04/21/2013    IMPRESSION: Worsening edema throughout the left lung compared to the preceding study.   Original Report Authenticated By: Sander Radon, M.D.   Scheduled Meds: . antiseptic oral rinse  15 mL Mouth Rinse QID  . bimatoprost  1 drop Both Eyes QHS  . feeding supplement  237 mL Oral BID BM  . furosemide  40 mg Oral Daily  . insulin aspart  0-15 Units Subcutaneous TID WC  . insulin aspart  0-15 Units Subcutaneous TID WC  . insulin aspart  0-5 Units Subcutaneous QHS  . levofloxacin  750 mg Oral Q48H  . levothyroxine  200 mcg Oral QAC breakfast  . metoprolol tartrate  25 mg Oral BID  . multivitamin with minerals  1 tablet Oral Daily  . nystatin   Topical BID  . pantoprazole  40 mg Oral Daily  . polyethylene glycol  17 g Oral BID  . sodium chloride  10-40 mL Intracatheter Q12H  .  vancomycin  750 mg Intravenous Q24H   Continuous Infusions:    Active Problems:   DIABETES MELLITUS, TYPE II   HYPERLIPIDEMIA   Morbid obesity with BMI of 40.0-44.9, adult   HYPERTENSION   FIBRILLATION, ATRIAL   Long term current use of anticoagulant   CHF exacerbation   Acute respiratory failure   Acute renal failure   Acute diastolic heart failure   Hematoma of groin   Pseudoaneurysm   Acute blood loss anemia   Hemorrhagic shock  Time spent: 25 mins  Pamella Pert  [facility] Pager [phone]. If 7PM-7AM, please contact night-coverage at [email or web address], password [facility] 04/26/2013, 8:33 AM  LOS: 12 days

## 2013-04-26 NOTE — Progress Notes (Signed)
Physical Therapy Treatment Patient Details Name: Amber Armstrong MRN: [id number] DOB: 1951-04-29 Today's Date: 04/26/2013 Time: 4098-1191 PT Time Calculation (min): 34 min  PT Assessment / Plan / Recommendation  PT Comments   Pt improving functional strength.  Min assistance required with bed exercises and mobility with Rt LE due to Rt groin pain.  Increased distance with gait training, RW with min assistance today.  Pt left in chair with call bell within reach, nurse and family member/visitors in room.  Ice applied to Rt groin for pain, pt stated pain scale 2/140 today.  Follow Up Recommendations        Does the patient have the potential to tolerate intense rehabilitation     Barriers to Discharge        Equipment Recommendations       Recommendations for Other Services    Frequency     Progress towards PT Goals    Plan      Precautions / Restrictions Precautions Precautions: Fall Restrictions Weight Bearing Restrictions: No    Mobility  Bed Mobility Bed Mobility: Sit to Supine Supine to Sit: 4: Min assist;With rails Sitting - Scoot to Edge of Bed: 4: Min assist Details for Bed Mobility Assistance: needs min assist to move RLE due to groin wound Transfers Transfers: Sit to Stand;Stand to Sit Sit to Stand: 5: Supervision;From bed Stand to Sit: 5: Supervision;To chair/3-in-1;With upper extremity assist Ambulation/Gait Ambulation/Gait Assistance: 4: Min assist Ambulation Distance (Feet): 28 Feet Assistive device: Rolling walker Ambulation/Gait Assistance Details: on 2 L O2... O2 sat 94% following bed exercises prior gait Gait Pattern: Decreased stance time - right;Decreased step length - left Gait velocity: slow and labored  Stairs: No    Exercises General Exercises - Lower Extremity Ankle Circles/Pumps: AROM;Both;10 reps;Supine Quad Sets: AROM;Both;10 reps;Supine Gluteal Sets: AROM;Both;10 reps;Supine Short Arc Quad: AROM;Both;10 reps;Supine Heel Slides:  AROM;Left;10 reps;Supine Hip ABduction/ADduction: AROM;Left;10 reps;Supine   PT Diagnosis:    PT Problem List:   PT Treatment Interventions:     PT Goals (current goals can now be found in the care plan section)    Visit Information  Last PT Received On: 04/26/13    Subjective Data      Cognition  Cognition Arousal/Alertness: Awake/alert Behavior During Therapy: [facility] for tasks assessed/performed Overall Cognitive Status: Within Functional Limits for tasks assessed    Balance     End of Session PT - End of Session Equipment Utilized During Treatment: Gait belt;Oxygen Activity Tolerance: Patient tolerated treatment well Patient left: in chair;with call bell/phone within reach;with nursing/sitter in room;with family/visitor present Nurse Communication: Mobility status   GP     Juel Burrow 04/26/2013, 11:54 AM

## 2013-04-26 NOTE — Progress Notes (Signed)
Subjective: Interval History: Patient presently alert and awake. Presently denies any nausea or vomiting and no dificutly in breathing.  Objective: Vital signs in last 24 hours: Temp:  [97.7 F (36.5 C)-98 F (36.7 C)] 97.9 F (36.6 C) (07/04 0429) Pulse Rate:  [92-121] 92 (07/04 0429) Resp:  [20] 20 (07/04 0429) BP: (120-165)/(78-113) 120/80 mmHg (07/04 0429) SpO2:  [83 %-99 %] 97 % (07/04 0429) Weight:  [94.756 kg (208 lb 14.4 oz)] 94.756 kg (208 lb 14.4 oz) (07/04 0429) Weight change: -1.089 kg (-2 lb 6.4 oz)  Intake/Output from previous day: 07/03 0701 - 07/04 0700 In: 1070 [P.O.:1070] Out: 1000 [Urine:1000] Intake/Output this shift:    Generally patient is awake and alert. Does not seem to be in any apparent distress. Chest decreased breath sound bilaterally and anteriorly. She has no rales or rhonchi bilaterally. Heart irregular rate and rhythm no murmur. Her heart rate is controlled Abdomen soft positive bowel sound Extremities no edema   Lab Results:  Recent Labs  04/25/13 0518 04/26/13 0500  WBC 12.8* 12.1*  HGB 8.7* 8.2*  HCT 27.7* 26.2*  PLT 149* 168   BMET:   Recent Labs  04/25/13 1544 04/26/13 0500  NA 135 138  K 3.5 3.6  CL 90* 92*  CO2 40* 39*  GLUCOSE 213* 185*  BUN 29* 29*  CREATININE 1.30* 1.34*  CALCIUM 8.6 8.3*   No results found for this basename: PTH,  in the last 72 hours Iron Studies: No results found for this basename: IRON, TIBC, TRANSFERRIN, FERRITIN,  in the last 72 hours  Studies/Results: No results found.  I have reviewed the patient's current medications.  Assessment/Plan: Problem #1 acute kidney injury superimposed on chronic her BUN and creatinine presently is improving. BUN is 29 and creatine is 1.34. Patient is none oliguric.  Possibly her base line Problem #2 CHF patient on lasix CHF  Is improving Problem #3 hypokalemia related most likely to her diuretics. pottassuim has corected Problem #4 diabetes Problem #5 a  trial fibrillation her heart rate is controlled Problem #6 respiratory failure possibly combination of CHF and also pneumonia. Patient is presently remains intubated.  Problem #7 hyperlipidemia Problem #8 obesity Problem #9 hypertension presently patient blood pressure seems to be low normal. Possibly from bleeding.  Plan continue with her present treatment     LOS: 12 days   Devere Brem S 04/26/2013,11:16 AM

## 2013-04-26 NOTE — Progress Notes (Signed)
6 Days Post-Op  Subjective: No complaints of pain.  Objective: Vital signs in last 24 hours: Temp:  [97.7 F (36.5 C)-98 F (36.7 C)] 97.9 F (36.6 C) (07/04 0429) Pulse Rate:  [92-121] 92 (07/04 0429) Resp:  [20] 20 (07/04 0429) BP: (120-165)/(78-113) 120/80 mmHg (07/04 0429) SpO2:  [83 %-99 %] 97 % (07/04 0429) Weight:  [94.756 kg (208 lb 14.4 oz)] 94.756 kg (208 lb 14.4 oz) (07/04 0429) Last BM Date: 04/14/13  Intake/Output from previous day: 07/03 0701 - 07/04 0700 In: 1070 [P.O.:1070] Out: 1000 [Urine:1000] Intake/Output this shift:    Extremities: Right groin with resolving ecchymosis present. Difficult to assess whether there may be a seroma present. Staple line intact. Pulses intact.  Lab Results:   Recent Labs  04/25/13 0518 04/26/13 0500  WBC 12.8* 12.1*  HGB 8.7* 8.2*  HCT 27.7* 26.2*  PLT 149* 168   BMET  Recent Labs  04/25/13 1544 04/26/13 0500  NA 135 138  K 3.5 3.6  CL 90* 92*  CO2 40* 39*  GLUCOSE 213* 185*  BUN 29* 29*  CREATININE 1.30* 1.34*  CALCIUM 8.6 8.3*   PT/INR  Recent Labs  04/25/13 0518 04/26/13 0500  LABPROT 14.2 14.8  INR 1.12 1.19    Studies/Results: No results found.  Anti-infectives: Anti-infectives   Start     Dose/Rate Route Frequency Ordered Stop   04/25/13 1000  levofloxacin (LEVAQUIN) tablet 750 mg     750 mg Oral Every 48 hours 04/25/13 0842     04/24/13 1600  vancomycin (VANCOCIN) IVPB 750 mg/150 ml premix     750 mg 150 mL/hr over 60 Minutes Intravenous Every 24 hours 04/24/13 0746     04/21/13 0600  vancomycin (VANCOCIN) IVPB 1000 mg/200 mL premix  Status:  Discontinued     1,000 mg 200 mL/hr over 60 Minutes Intravenous Every 24 hours 04/20/13 1012 04/24/13 0746   04/17/13 1000  vancomycin (VANCOCIN) 1,500 mg in sodium chloride 0.9 % 500 mL IVPB  Status:  Discontinued     1,500 mg 250 mL/hr over 120 Minutes Intravenous Every 24 hours 04/17/13 0807 04/20/13 1012   04/17/13 0900  levofloxacin  (LEVAQUIN) IVPB 750 mg  Status:  Discontinued     750 mg 100 mL/hr over 90 Minutes Intravenous Every 48 hours 04/17/13 0807 04/25/13 0842   04/17/13 0800  levofloxacin (LEVAQUIN) IVPB 500 mg  Status:  Discontinued     500 mg 100 mL/hr over 60 Minutes Intravenous Every 24 hours 04/17/13 0747 04/17/13 0807      Assessment/Plan: s/p Procedure(s): GROIN EXPLORATION Impression: Right groin wound appears to be stable with resolving ecchymosis. Hemoglobin stable.  LOS: 12 days    Thayden Lemire A 04/26/2013

## 2013-04-27 ENCOUNTER — Inpatient Hospital Stay (HOSPITAL_COMMUNITY): Payer: BC Managed Care – PPO

## 2013-04-27 LAB — GLUCOSE, CAPILLARY: Glucose-Capillary: 134 mg/dL — ABNORMAL HIGH (ref 70–99)

## 2013-04-27 LAB — BASIC METABOLIC PANEL
BUN: 28 mg/dL — ABNORMAL HIGH (ref 6–23)
CO2: 42 mEq/L (ref 19–32)
Chloride: 94 mEq/L — ABNORMAL LOW (ref 96–112)
Creatinine, Ser: 1.4 mg/dL — ABNORMAL HIGH (ref 0.50–1.10)

## 2013-04-27 LAB — CBC
HCT: 26.6 % — ABNORMAL LOW (ref 36.0–46.0)
MCV: 93.7 fL (ref 78.0–100.0)
Platelets: 192 10*3/uL (ref 150–400)
RBC: 2.84 MIL/uL — ABNORMAL LOW (ref 3.87–5.11)
WBC: 10.2 10*3/uL (ref 4.0–10.5)

## 2013-04-27 LAB — PHOSPHORUS: Phosphorus: 3.1 mg/dL (ref 2.3–4.6)

## 2013-04-27 MED ORDER — POTASSIUM CHLORIDE 20 MEQ PO PACK
20.0000 meq | PACK | Freq: Every day | ORAL | Status: DC
  Administered 2013-04-27: 20 meq via ORAL
  Filled 2013-04-27 (×3): qty 1

## 2013-04-27 MED ORDER — METOPROLOL TARTRATE 50 MG PO TABS
50.0000 mg | ORAL_TABLET | Freq: Two times a day (BID) | ORAL | Status: DC
  Administered 2013-04-27: 50 mg via ORAL
  Filled 2013-04-27: qty 1

## 2013-04-27 NOTE — Progress Notes (Addendum)
Amber Armstrong  MRN: [id number]  DOB/AGE: [birth year] 62 y.o.  Primary Care Physician:JACKSON,SAMANTHA, PA-C  Admit date: 04/14/2013  Chief Complaint:  Chief Complaint  Patient presents with  . Shortness of Breath    S-Pt presented on  04/14/2013 with  Chief Complaint  Patient presents with  . Shortness of Breath  .    Pt today feels better.She says " I feel a whole lot better"    Pt offers no new complaints.  Meds . antiseptic oral rinse  15 mL Mouth Rinse BID  . bimatoprost  1 drop Both Eyes QHS  . feeding supplement  237 mL Oral BID BM  . furosemide  40 mg Oral Daily  . insulin aspart  0-15 Units Subcutaneous TID WC  . insulin aspart  0-15 Units Subcutaneous TID WC  . insulin aspart  0-5 Units Subcutaneous QHS  . levofloxacin  750 mg Oral Q48H  . levothyroxine  200 mcg Oral QAC breakfast  . metoprolol tartrate  25 mg Oral BID  . multivitamin with minerals  1 tablet Oral Daily  . nystatin   Topical BID  . pantoprazole  40 mg Oral Daily  . polyethylene glycol  17 g Oral BID  . sodium chloride  10-40 mL Intracatheter Q12H  . vancomycin  750 mg Intravenous Q24H        Physical Exam: Vital signs in last 24 hours: Temp:  [97.5 F (36.4 C)-98.1 F (36.7 C)] 98.1 F (36.7 C) (07/05 0534) Pulse Rate:  [86-105] 86 (07/05 0852) Resp:  [20] 20 (07/05 0534) BP: (138-147)/(74-95) 145/74 mmHg (07/05 0534) SpO2:  [95 %-99 %] 98 % (07/05 0852) Weight:  [206 lb 9.6 oz (93.713 kg)] 206 lb 9.6 oz (93.713 kg) (07/05 0534) Weight change: -2 lb 4.8 oz (-1.043 kg) Last BM Date: 04/14/13  Intake/Output from previous day: 07/04 0701 - 07/05 0700 In: 1450 [P.O.:960; I.V.:40; IV Piggyback:450] Out: 3000 [Urine:3000]     Physical Exam: General- pt is awake,alert, oriented to time place and person Resp- No acute REsp distress, Chest clear to auscultataion. CVS- S1S2 Irregular in rate and rhythm GIT- BS+, soft, NT, ND EXT-    NO LE edema.    Lab Results: CBC  Recent  Labs  04/26/13 0500 04/27/13 0804  WBC 12.1* 10.2  HGB 8.2* 8.4*  HCT 26.2* 26.6*  PLT 168 192    BMET  Recent Labs  04/26/13 0500 04/27/13 0804  NA 138 141  K 3.6 3.4*  CL 92* 94*  CO2 39* 42*  GLUCOSE 185* 155*  BUN 29* 28*  CREATININE 1.34* 1.40*  CALCIUM 8.3* 8.6   Trend Creat 2014 2.0==>1.7==>(1.35--1.4) 2013 1.0 2011 1.0  MICRO Recent Results (from the past 240 hour(s))  URINE CULTURE     Status: None   Collection Time    04/17/13 11:40 AM      Result Value Range Status   Specimen Description URINE, CLEAN CATCH   Final   Special Requests NONE   Final   Culture  Setup Time 04/17/2013 12:20   Final   Colony Count NO GROWTH   Final   Culture NO GROWTH   Final   Report Status 04/18/2013 FINAL   Final      Lab Results  Component Value Date   CALCIUM 8.6 04/27/2013   PHOS 2.0* 04/23/2013        Impression: 1)Renal  AKI secondary to Prerenal /ATN/ Cardiorenal   AKI secondary to Hypotension/Hemorragic shock AKi now better-responded to  IV pressors + lasix Pt Creat now stable at 1.4  2)CVS-hemodynamically stable BP at goal   3)Anemia HGb at goal (More than 7) Secondary to bleeding  4)Afib Being followed By Cardiology  5)REsp- Pt has acute resp failure- Was intubated now extubated clinically much better.  6)Electrolytes  Hypokalemia  secondary to Diuretics  NOrmonatremic  7)Acid base Co2 at goal  9) Hypophosphatemia- most likely secondary to diureses Will recheck   Plan:  Will replace Potassium. Will follow bmet to see K and Creat. Will recheck phos     Amber Armstrong S 04/27/2013, 10:02 AM

## 2013-04-27 NOTE — Progress Notes (Signed)
[facility] PROGRESS NOTE  EZINNE YOGI EAV:[id number] DOB: [birth year] DOA: 04/14/2013 PCP: Pershing Proud  Assessment/Plan:  Right leg swelling - more prominent today, likely related to her groin complications, - r/o DVT with [place] today Acute on chronic respiratory failure, multifactorial. Patient extubated off the ventilator 7/1. ventilator. Continuing diuresis, transitioned to po Lasix 7/2 per renal.  - still on O2, wean off as tolerated - encourage ambulation with PT HCAP - on antibiotics since 6/25. Plan for 10 days, last day today Acute right-sided congestive heart failure. Edema improving. Continue diuresis and appreciate cardiology help Acute renal failure. Creatinine continued to improve, Nephrology is following.  Continue to monitor urine output Atrial fibrillation. Patient is having episodes of intermittent tachycardia. Will need to be cautious with beta blockers since patient is prone to hypotension. Anticoagulation has been discontinued due to bleeding from right groin site - BB started 7/3, patient with improved heart rates and stable BP. Monitor. - resting heart rate 90-100, good blood pressures, increase metoprolol on 7/5 to 50 BID Hemorrhagic shock. Due to bleeding from right groin site. Blood pressure better. Status post 5 units packed red blood cells Acute blood loss anemia. Due to bleeding from right groin site. Patient received 5 units of PRBCs and 2 units of FFP. Hemoglobin unchanged Type 2 diabetes. Blood sugars have been stable. Hypokalemia: Replace as indicated Acute right groin hematoma with pseudoaneurysm. Patient had significant bleeding from right groin when her femoral central line was discontinued yesterday, complicated by the fact that she was anticoagulated with Lovenox. Patient received protamine as well as FFP to reverse her coagulopathy. She had significant bleeding requiring transfusion of PRBCs and FFPs. Ultimately, she required surgical repair  by Dr. Leticia Penna to achieve hemostasis. Stable today. - surgery following, stable.  Nutrition: started oral intake, d/c TPN - appreciate nutrition help   Code Status: Patient was made DO NOT RESUSCITATE by Dr. Karilyn Cota.; Family Communication: Discussed with patient's mother bedside Disposition Plan:  Likely will need short-term skilled nursing for rehabilitation; plan for Sunday/Monday if stable.    Consultants:  [facility] cardiology  Nephrology, Dr. Kristian Covey  General surgery, Dr. Leticia Penna  Pulmonary, Dr. Juanetta Gosling  Procedures:  Intubation on 6/25, extubated 7/1  Central line placement on 6/25, by Dr. Leticia Penna  Right groin exploration, suture repair of right femoral vein, suture repair artery, right radial arterial line placement 6/28 by Dr. Leticia Penna  Antibiotics: Vancomycin 6/25 >> Levofloxacin 6/25 >>  HPI/Subjective: - no complaints, feels well  Objective: Filed Vitals:   04/26/13 1323 04/26/13 2136 04/27/13 0534 04/27/13 0852  BP: 147/95 138/92 145/74   Pulse: 99 104 105 86  Temp: 97.6 F (36.4 C) 97.5 F (36.4 C) 98.1 F (36.7 C)   TempSrc:  Oral Oral   Resp: 20 20 20    Height:      Weight:   93.713 kg (206 lb 9.6 oz)   SpO2: 99% 95% 99% 98%    Intake/Output Summary (Last 24 hours) at 04/27/13 1133 Last data filed at 04/27/13 0600  Gross per 24 hour  Intake   1330 ml  Output   3000 ml  Net  -1670 ml   Filed Weights   04/25/13 0523 04/26/13 0429 04/27/13 0534  Weight: 95.845 kg (211 lb 4.8 oz) 94.756 kg (208 lb 14.4 oz) 93.713 kg (206 lb 9.6 oz)    Exam:   General:  NAD, awake and no acute distress  Cardiovascular: Irregular rhythm, rate controlled  Respiratory: Occasional rhonchi, mostly clear  Abdomen:  soft, nt, nd, bs+  Musculoskeletal:trace edema b/l in lower extremities, right groin tender to palpation, ecchymotic; dressing intact without bleeding. Right arm with large ecchymosis. R ankle more swollen than left   Data Reviewed: Basic  Metabolic Panel:  Recent Labs Lab 04/23/13 0451  04/24/13 0655 04/24/13 1019 04/25/13 1544 04/26/13 0500 04/27/13 0804  NA 139  < > 139 138 135 138 141  K 3.4*  < > 3.5 3.5 3.5 3.6 3.4*  CL 95*  < > 92* 91* 90* 92* 94*  CO2 41*  < > 43* 44* 40* 39* 42*  GLUCOSE 168*  < > 190* 220* 213* 185* 155*  BUN 29*  < > 28* 28* 29* 29* 28*  CREATININE 1.50*  < > 1.40* 1.41* 1.30* 1.34* 1.40*  CALCIUM 8.2*  < > 8.7 8.6 8.6 8.3* 8.6  MG 1.7  --   --   --   --   --   --   PHOS 2.0*  --   --   --   --   --   --   < > = values in this interval not displayed. Liver Function Tests:  Recent Labs Lab 04/23/13 0451  AST 18  ALT 10  ALKPHOS 67  BILITOT 1.2  PROT 5.3*  ALBUMIN 2.3*   CBC:  Recent Labs Lab 04/23/13 0451  04/24/13 0456 04/24/13 1017 04/25/13 0518 04/26/13 0500 04/27/13 0804  WBC 13.2*  < > 12.6* 14.3* 12.8* 12.1* 10.2  NEUTROABS 9.7*  --   --   --   --   --   --   HGB 8.1*  < > 8.0* 9.3* 8.7* 8.2* 8.4*  HCT 24.6*  < > 28.7* 29.3* 27.7* 26.2* 26.6*  MCV 89.8  < > 106.7* 92.4 92.6 92.9 93.7  PLT 105*  < > 118* 144* 149* 168 192  < > = values in this interval not displayed. BNP (last 3 results)  Recent Labs  04/14/13 1030 04/26/13 0500  PROBNP 12159.0* 7558.0*   CBG:  Recent Labs Lab 04/26/13 0700 04/26/13 1131 04/26/13 1658 04/26/13 2129 04/27/13 0743  GLUCAP 155* 137* 133* 138* 151*   Studies: Dg Chest Port 1 View  04/22/2013  IMPRESSION:  1.  Stable support apparatus. 2.  Shifting bilateral airspace disease most compatible with pulmonary edema.  Superimposed basilar atelectasis and shifting pleural effusions.   Original Report Authenticated By: Andreas Newport, M.D.   Dg Chest Port 1 View  04/21/2013    IMPRESSION: Worsening edema throughout the left lung compared to the preceding study.   Original Report Authenticated By: Sander Radon, M.D.   Scheduled Meds: . antiseptic oral rinse  15 mL Mouth Rinse BID  . bimatoprost  1 drop Both Eyes QHS   . feeding supplement  237 mL Oral BID BM  . furosemide  40 mg Oral Daily  . insulin aspart  0-15 Units Subcutaneous TID WC  . insulin aspart  0-15 Units Subcutaneous TID WC  . insulin aspart  0-5 Units Subcutaneous QHS  . levofloxacin  750 mg Oral Q48H  . levothyroxine  200 mcg Oral QAC breakfast  . metoprolol tartrate  25 mg Oral BID  . multivitamin with minerals  1 tablet Oral Daily  . nystatin   Topical BID  . pantoprazole  40 mg Oral Daily  . polyethylene glycol  17 g Oral BID  . potassium chloride  20 mEq Oral Daily  . sodium chloride  10-40 mL Intracatheter Q12H  .  vancomycin  750 mg Intravenous Q24H   Continuous Infusions:    Active Problems:   DIABETES MELLITUS, TYPE II   HYPERLIPIDEMIA   Morbid obesity with BMI of 40.0-44.9, adult   HYPERTENSION   FIBRILLATION, ATRIAL   Long term current use of anticoagulant   CHF exacerbation   Acute respiratory failure   Acute renal failure   Acute diastolic heart failure   Hematoma of groin   Pseudoaneurysm   Acute blood loss anemia   Hemorrhagic shock  Brief Narrative/Interim Summary   This lady was admitted to the hospital with shortness of breath due to CHF exacerbation.  She was started on intravenous lasix but did not have any significant response.  On 6/25 patient was found to be unresponsive and had a significant respiratory acidosis with a pco2 of 125 and pH of 7.06.  She was transferred to the ICU and intubated.  She was followed by cardiology for CHF, pulmonology for ventilator management and nephrology for acute renal failure.  Patient was found to be hypotensive, so femoral central line was placed by general surgery for vasopressor support to aide with diuresis.  Patient began to experience good response to lasix and overall condition was starting to improve. Creatinine also began to improve with diuresis.  Patient began to experience thick secretions from ET tube, and chest xray raised concerns for possible developing  pneumonia.  She was started empirically on vancomycin and levofloxacin. Patient subsequently had a picc line placed for continued central access.  Once Picc line was placed, femoral central line was discontinued.  Unfortunately, patient had significant bleeding from right groin and developed a large right groin hematoma with pseudoaneurysm.  This was also complicated by the fact that patient was on full dose lovenox for atrial fibrillation. This eventually required operative repair by Dr. Leticia Penna.  Patient was transfused multiple units of FFP and PRBC for bleeding and anemia.  At this time, it appears that her bleeding has resolved.  By 7/1 morning, patient was able to be successfully weaned off the ventilator. Patient on the floor since 7/1, weaned off TPN. Lasix transitioned to po on 7/2. Antibiotics discontinued 7/5 after 10 days.   Time spent: 25 mins  Pamella Pert  [facility] Pager [phone]. If 7PM-7AM, please contact night-coverage at [email or web address], password [facility] 04/27/2013, 11:33 AM  LOS: 13 days

## 2013-04-27 NOTE — Progress Notes (Signed)
CRITICAL VALUE ALERT  Critical value received:  CO2 42  Date of notification:  04/27/13  Time of notification:  0920  Critical value read back:yes  Nurse who received alert:  Dagoberto Ligas  MD notified (1st page):  Dr. Elvera Lennox  Time of first page:  0922  MD notified (2nd page):  Time of second page:  Responding MD:    Time MD responded:

## 2013-04-27 NOTE — Progress Notes (Signed)
7 Days Post-Op  Subjective: No significant planes of pain in right groin.  Objective: Vital signs in last 24 hours: Temp:  [97.5 F (36.4 C)-98.1 F (36.7 C)] 98.1 F (36.7 C) (07/05 0534) Pulse Rate:  [86-105] 86 (07/05 0852) Resp:  [20] 20 (07/05 0534) BP: (138-147)/(74-95) 145/74 mmHg (07/05 0534) SpO2:  [95 %-99 %] 98 % (07/05 0852) Weight:  [93.713 kg (206 lb 9.6 oz)] 93.713 kg (206 lb 9.6 oz) (07/05 0534) Last BM Date: 04/14/13  Intake/Output from previous day: 07/04 0701 - 07/05 0700 In: 1450 [P.O.:960; I.V.:40; IV Piggyback:450] Out: 3000 [Urine:3000] Intake/Output this shift:    Extremities: Right groin still swollen and edematous. No significant seroma found on expressing fluid from wound. Skin is still somewhat excoriated. Ecchymosis is resolving. Pedal pulses are noted in the right foot. The right foot is warm. Incision is intact.  Lab Results:   Recent Labs  04/26/13 0500 04/27/13 0804  WBC 12.1* 10.2  HGB 8.2* 8.4*  HCT 26.2* 26.6*  PLT 168 192   BMET  Recent Labs  04/26/13 0500 04/27/13 0804  NA 138 141  K 3.6 3.4*  CL 92* 94*  CO2 39* 42*  GLUCOSE 185* 155*  BUN 29* 28*  CREATININE 1.34* 1.40*  CALCIUM 8.3* 8.6   PT/INR  Recent Labs  04/26/13 0500 04/27/13 0804  LABPROT 14.8 14.8  INR 1.19 1.19    Studies/Results: No results found.  Anti-infectives: Anti-infectives   Start     Dose/Rate Route Frequency Ordered Stop   04/25/13 1000  levofloxacin (LEVAQUIN) tablet 750 mg     750 mg Oral Every 48 hours 04/25/13 0842     04/24/13 1600  vancomycin (VANCOCIN) IVPB 750 mg/150 ml premix     750 mg 150 mL/hr over 60 Minutes Intravenous Every 24 hours 04/24/13 0746     04/21/13 0600  vancomycin (VANCOCIN) IVPB 1000 mg/200 mL premix  Status:  Discontinued     1,000 mg 200 mL/hr over 60 Minutes Intravenous Every 24 hours 04/20/13 1012 04/24/13 0746   04/17/13 1000  vancomycin (VANCOCIN) 1,500 mg in sodium chloride 0.9 % 500 mL IVPB   Status:  Discontinued     1,500 mg 250 mL/hr over 120 Minutes Intravenous Every 24 hours 04/17/13 0807 04/20/13 1012   04/17/13 0900  levofloxacin (LEVAQUIN) IVPB 750 mg  Status:  Discontinued     750 mg 100 mL/hr over 90 Minutes Intravenous Every 48 hours 04/17/13 0807 04/25/13 0842   04/17/13 0800  levofloxacin (LEVAQUIN) IVPB 500 mg  Status:  Discontinued     500 mg 100 mL/hr over 60 Minutes Intravenous Every 24 hours 04/17/13 0747 04/17/13 0807      Assessment/Plan: s/p Procedure(s): GROIN EXPLORATION Impression: Status post right femoral artery and vein repair. Right groin wound healing is progressing with resolving hematoma present. Continue current wound care.  LOS: 13 days    Kellsey Sansone A 04/27/2013

## 2013-04-28 LAB — BASIC METABOLIC PANEL
BUN: 30 mg/dL — ABNORMAL HIGH (ref 6–23)
CO2: 42 mEq/L (ref 19–32)
Calcium: 8.7 mg/dL (ref 8.4–10.5)
Chloride: 93 mEq/L — ABNORMAL LOW (ref 96–112)
Creatinine, Ser: 1.48 mg/dL — ABNORMAL HIGH (ref 0.50–1.10)
GFR calc Af Amer: 42 mL/min — ABNORMAL LOW (ref >90)
GFR calc non Af Amer: 37 mL/min — ABNORMAL LOW (ref >90)
Glucose, Bld: 167 mg/dL — ABNORMAL HIGH (ref 70–99)
Potassium: 3.4 mEq/L — ABNORMAL LOW (ref 3.5–5.1)
Sodium: 139 mEq/L (ref 135–145)

## 2013-04-28 LAB — CBC
HCT: 26.4 % — ABNORMAL LOW (ref 36.0–46.0)
MCH: 28.7 pg (ref 26.0–34.0)
MCHC: 30.3 g/dL (ref 30.0–36.0)
MCV: 94.6 fL (ref 78.0–100.0)
RDW: 20.1 % — ABNORMAL HIGH (ref 11.5–15.5)

## 2013-04-28 LAB — GLUCOSE, CAPILLARY

## 2013-04-28 LAB — PROTIME-INR: Prothrombin Time: 15.7 seconds — ABNORMAL HIGH (ref 11.6–15.2)

## 2013-04-28 MED ORDER — METOPROLOL TARTRATE 50 MG PO TABS
100.0000 mg | ORAL_TABLET | Freq: Two times a day (BID) | ORAL | Status: DC
  Administered 2013-04-28 – 2013-04-29 (×3): 100 mg via ORAL
  Filled 2013-04-28 (×3): qty 2

## 2013-04-28 MED ORDER — POTASSIUM CHLORIDE 20 MEQ PO PACK: 30.0000 meq | PACK | Freq: Every day | ORAL | Status: DC

## 2013-04-28 MED ORDER — POTASSIUM CHLORIDE 20 MEQ PO PACK
40.0000 meq | PACK | Freq: Every day | ORAL | Status: DC
  Filled 2013-04-28: qty 2

## 2013-04-28 NOTE — Progress Notes (Signed)
[facility] PROGRESS NOTE  Amber Armstrong ZOX:[id number] DOB: 1951/06/13 DOA: 04/14/2013 PCP: Pershing Proud  Assessment/Plan:  Right leg swelling - more prominent, related to her groin complications; [place] negative for DVT Atrial fibrillation. Patient is having episodes of intermittent tachycardia. Will need to be cautious with beta blockers since patient is prone to hypotension. Anticoagulation has been discontinued due to bleeding from right groin site - BB started 7/3, patient with improved heart rates and stable BP. Monitor. - resting heart rate 90-100, good blood pressures, increase metoprolol on 7/5 to 50 BID and 100 BID on 7/6 for persistent HR 110s at rest.  Acute on chronic respiratory failure, multifactorial. Patient extubated off the ventilator 7/1. ventilator. Continuing diuresis, transitioned to po Lasix 7/2 per renal.  - still on O2, wean off as tolerated - encourage ambulation with PT HCAP - on antibiotics since 6/25. Plan for 10 days, last day 7/5 Acute right-sided congestive heart failure. Edema improving. Continue diuresis. Acute renal failure. Creatinine continued to improve, Nephrology is following.  Continue to monitor urine output Hemorrhagic shock. Due to bleeding from right groin site. Blood pressure better. Status post 5 units packed red blood cells Acute blood loss anemia. Due to bleeding from right groin site. Patient received 5 units of PRBCs and 2 units of FFP. Hemoglobin unchanged Type 2 diabetes. Blood sugars have been stable. Hypokalemia: Replace as indicated Acute right groin hematoma with pseudoaneurysm. Patient had significant bleeding from right groin when her femoral central line was discontinued yesterday, complicated by the fact that she was anticoagulated with Lovenox. Patient received protamine as well as FFP to reverse her coagulopathy. She had significant bleeding requiring transfusion of PRBCs and FFPs. Ultimately, she required surgical  repair by Dr. Leticia Penna to achieve hemostasis. Stable today. Nutrition: started oral intake, d/c TPN - appreciate nutrition help  Code Status: Patient was made DO NOT RESUSCITATE by Dr. Karilyn Cota.; Family Communication: Discussed with patient's mother bedside Disposition Plan:  Likely will need short-term skilled nursing for rehabilitation; plan for Monday if stable.   Consultants:  [facility] cardiology  Nephrology, Dr. Kristian Covey  General surgery, Dr. Leticia Penna  Pulmonary, Dr. Juanetta Gosling  Procedures:  Intubation on 6/25, extubated 7/1  Central line placement on 6/25, by Dr. Leticia Penna  Right groin exploration, suture repair of right femoral vein, suture repair artery, right radial arterial line placement 6/28 by Dr. Leticia Penna  Antibiotics: Vancomycin 6/25 >> 7/5 Levofloxacin 6/25 >> 7/5  HPI/Subjective: - no complaints, feels well  Objective: Filed Vitals:   04/27/13 1525 04/27/13 2137 04/28/13 0537 04/28/13 0821  BP:  145/92 128/80   Pulse: 98 107 107 110  Temp: 97.7 F (36.5 C) 98.4 F (36.9 C) 97.7 F (36.5 C)   TempSrc:  Oral Oral   Resp: 20 20 20    Height:      Weight:   93.577 kg (206 lb 4.8 oz)   SpO2: 100% 100% 97% 95%    Intake/Output Summary (Last 24 hours) at 04/28/13 1015 Last data filed at 04/28/13 0700  Gross per 24 hour  Intake    970 ml  Output    901 ml  Net     69 ml   Filed Weights   04/26/13 0429 04/27/13 0534 04/28/13 0537  Weight: 94.756 kg (208 lb 14.4 oz) 93.713 kg (206 lb 9.6 oz) 93.577 kg (206 lb 4.8 oz)    Exam:   General:  NAD, awake and no acute distress  Cardiovascular: Irregular rhythm, rate controlled  Respiratory: Occasional rhonchi,  mostly clear  Abdomen: soft, nt, nd, bs+  Musculoskeletal:trace edema b/l in lower extremities, right groin tender to palpation, ecchymotic; dressing intact without bleeding. Right arm with large ecchymosis. R ankle more swollen than left   Data Reviewed: Basic Metabolic Panel:  Recent  Labs Lab 04/23/13 0451  04/24/13 1019 04/25/13 1544 04/26/13 0500 04/27/13 0804 04/27/13 1252 04/28/13 0541  NA 139  < > 138 135 138 141  --  139  K 3.4*  < > 3.5 3.5 3.6 3.4*  --  3.4*  CL 95*  < > 91* 90* 92* 94*  --  93*  CO2 41*  < > 44* 40* 39* 42*  --  42*  GLUCOSE 168*  < > 220* 213* 185* 155*  --  167*  BUN 29*  < > 28* 29* 29* 28*  --  30*  CREATININE 1.50*  < > 1.41* 1.30* 1.34* 1.40*  --  1.48*  CALCIUM 8.2*  < > 8.6 8.6 8.3* 8.6  --  8.7  MG 1.7  --   --   --   --   --   --   --   PHOS 2.0*  --   --   --   --   --  3.1  --   < > = values in this interval not displayed. Liver Function Tests:  Recent Labs Lab 04/23/13 0451  AST 18  ALT 10  ALKPHOS 67  BILITOT 1.2  PROT 5.3*  ALBUMIN 2.3*   CBC:  Recent Labs Lab 04/23/13 0451  04/24/13 1017 04/25/13 0518 04/26/13 0500 04/27/13 0804 04/28/13 0541  WBC 13.2*  < > 14.3* 12.8* 12.1* 10.2 9.8  NEUTROABS 9.7*  --   --   --   --   --   --   HGB 8.1*  < > 9.3* 8.7* 8.2* 8.4* 8.0*  HCT 24.6*  < > 29.3* 27.7* 26.2* 26.6* 26.4*  MCV 89.8  < > 92.4 92.6 92.9 93.7 94.6  PLT 105*  < > 144* 149* 168 192 200  < > = values in this interval not displayed. BNP (last 3 results)  Recent Labs  04/14/13 1030 04/26/13 0500  PROBNP 12159.0* 7558.0*   CBG:  Recent Labs Lab 04/27/13 0743 04/27/13 1209 04/27/13 1722 04/27/13 2135 04/28/13 0749  GLUCAP 151* 134* 130* 128* 154*   Studies: Dg Chest Port 1 View  04/22/2013  IMPRESSION:  1.  Stable support apparatus. 2.  Shifting bilateral airspace disease most compatible with pulmonary edema.  Superimposed basilar atelectasis and shifting pleural effusions.   Original Report Authenticated By: Andreas Newport, M.D.   Dg Chest Port 1 View  04/21/2013    IMPRESSION: Worsening edema throughout the left lung compared to the preceding study.   Original Report Authenticated By: Sander Radon, M.D.   Scheduled Meds: . antiseptic oral rinse  15 mL Mouth Rinse BID  .  bimatoprost  1 drop Both Eyes QHS  . feeding supplement  237 mL Oral BID BM  . furosemide  40 mg Oral Daily  . insulin aspart  0-15 Units Subcutaneous TID WC  . insulin aspart  0-15 Units Subcutaneous TID WC  . insulin aspart  0-5 Units Subcutaneous QHS  . levothyroxine  200 mcg Oral QAC breakfast  . metoprolol tartrate  100 mg Oral BID  . multivitamin with minerals  1 tablet Oral Daily  . nystatin   Topical BID  . pantoprazole  40 mg Oral Daily  . polyethylene  glycol  17 g Oral BID  . [START ON 04/29/2013] potassium chloride  40 mEq Oral Daily  . sodium chloride  10-40 mL Intracatheter Q12H   Continuous Infusions:    Active Problems:   DIABETES MELLITUS, TYPE II   HYPERLIPIDEMIA   Morbid obesity with BMI of 40.0-44.9, adult   HYPERTENSION   FIBRILLATION, ATRIAL   Long term current use of anticoagulant   CHF exacerbation   Acute respiratory failure   Acute renal failure   Acute diastolic heart failure   Hematoma of groin   Pseudoaneurysm   Acute blood loss anemia   Hemorrhagic shock  Brief Narrative/Interim Summary   This lady was admitted to the hospital with shortness of breath due to CHF exacerbation.  She was started on intravenous lasix but did not have any significant response.  On 6/25 patient was found to be unresponsive and had a significant respiratory acidosis with a pco2 of 125 and pH of 7.06.  She was transferred to the ICU and intubated.  She was followed by cardiology for CHF, pulmonology for ventilator management and nephrology for acute renal failure.  Patient was found to be hypotensive, so femoral central line was placed by general surgery for vasopressor support to aide with diuresis.  Patient began to experience good response to lasix and overall condition was starting to improve. Creatinine also began to improve with diuresis.  Patient began to experience thick secretions from ET tube, and chest xray raised concerns for possible developing pneumonia.  She was  started empirically on vancomycin and levofloxacin. Patient subsequently had a picc line placed for continued central access.  Once Picc line was placed, femoral central line was discontinued.  Unfortunately, patient had significant bleeding from right groin and developed a large right groin hematoma with pseudoaneurysm.  This was also complicated by the fact that patient was on full dose lovenox for atrial fibrillation. This eventually required operative repair by Dr. Leticia Penna.  Patient was transfused multiple units of FFP and PRBC for bleeding and anemia.  At this time, it appears that her bleeding has resolved.  By 7/1 morning, patient was able to be successfully weaned off the ventilator. Patient on the floor since 7/1, weaned off TPN. Lasix transitioned to po on 7/2. Antibiotics discontinued 7/5 after 10 days.   Time spent: 25 mins  Pamella Pert  [facility] Pager [phone]. If 7PM-7AM, please contact night-coverage at [email or web address], password [facility] 04/28/2013, 10:15 AM  LOS: 14 days

## 2013-04-28 NOTE — Progress Notes (Signed)
CRITICAL VALUE ALERT  Critical value received:  CO2  Date of notification:  23  Time of notification:  0710  Critical value read back:yes  Nurse who received alert:  Ammie Ferrier   MD notified (1st page):  Dr. Elvera Lennox  Time of first page:  0757  MD notified (2nd page):  Time of second page:  Responding MD:    Time MD responded:

## 2013-04-28 NOTE — Progress Notes (Signed)
Subjective: Interval History: Patient presently alert and awake. Presently denies any nausea or vomiting and no dificutly in breathing. Complains of difficulty is chewing because of her loose teeth. Patient states that she has appointment as out patient. Objective: Vital signs in last 24 hours: Temp:  [97.7 F (36.5 C)-98.4 F (36.9 C)] 97.7 F (36.5 C) (07/06 0537) Pulse Rate:  [98-110] 110 (07/06 0821) Resp:  [20] 20 (07/06 0537) BP: (128-145)/(80-92) 128/80 mmHg (07/06 0537) SpO2:  [95 %-100 %] 95 % (07/06 0821) Weight:  [93.577 kg (206 lb 4.8 oz)] 93.577 kg (206 lb 4.8 oz) (07/06 0537) Weight change: -0.136 kg (-4.8 oz)  Intake/Output from previous day: 07/05 0701 - 07/06 0700 In: 970 [P.O.:480; I.V.:40] Out: 901 [Urine:900; Stool:1] Intake/Output this shift:    Generally patient is awake and alert. Does not seem to be in any apparent distress. Chest decreased breath sound bilaterally and anteriorly. She has no rales or rhonchi bilaterally. Heart irregular rate and rhythm no murmur. Her heart rate is controlled Abdomen soft positive bowel sound Extremities  Trace on the left  Edema and 1 to 2+plus edema on the right   Lab Results:  Recent Labs  04/27/13 0804 04/28/13 0541  WBC 10.2 9.8  HGB 8.4* 8.0*  HCT 26.6* 26.4*  PLT 192 200   BMET:   Recent Labs  04/27/13 0804 04/28/13 0541  NA 141 139  K 3.4* 3.4*  CL 94* 93*  CO2 42* 42*  GLUCOSE 155* 167*  BUN 28* 30*  CREATININE 1.40* 1.48*  CALCIUM 8.6 8.7   No results found for this basename: PTH,  in the last 72 hours Iron Studies: No results found for this basename: IRON, TIBC, TRANSFERRIN, FERRITIN,  in the last 72 hours  Studies/Results: US Venous Img Lower Unilateral Right  04/27/2013   *RADIOLOGY REPORT*  Clinical Data: Right leg pain.  RIGHT LOWER EXTREMITY VENOUS DUPLEX ULTRASOUND  Technique:  Gray-scale sonography with graded compression, as well as color Doppler and duplex ultrasound were performed  to evaluate the deep venous system of the lower extremity from the level of the common femoral vein through the popliteal and proximal calf veins. Spectral Doppler was utilized to evaluate flow at rest and with distal augmentation maneuvers.  Comparison:  None.  Findings:  There is a complex fluid collection in the right groin that is reportedly related to recent surgery.  Findings probably represent a hematoma without significant flow.  As a result, the right common femoral vein, profunda femoral vein and proximal femoral vein were not visualized.  Normal compressibility, augmentation and color Doppler flow in the mid and distal femoral vein.  Normal compressibility, color Doppler flow and augmentation in the popliteal vein.  Visualized calf veins are patent.  There is diffuse subcutaneous edema.  IMPRESSION: No evidence for deep vein thrombosis in the visualized right lower extremity deep veins.  Please note that the right common femoral vein, profunda femoral vein and proximal superficial vein were not visualized due to postsurgical changes and probable hematoma in the right groin.   Original Report Authenticated By: Richarda Overlie, M.D.    I have reviewed the patient's current medications.  Assessment/Plan: Problem #1 acute kidney injury superimposed on chronic her BUN and creatinine presently is improving. BUN is 30 and creatine is 1.48. Patient is none oliguric.  Possibly her base line Problem #2 CHF patient on lasix CHF  Is improving Problem #3 hypokalemia related most likely to her diuretics. pottassuim low but stable Problem #4  diabetes Problem #5 a trial fibrillation her heart rate seems fluctuating and remains low 100's Problem #6 respiratory failure possibly combination of CHF and also pneumonia. Patient is presently remains intubated.  Problem #7 hyperlipidemia Problem #8 obesity Problem #9 hypertension presently patient blood pressure seems to be low normal and stable Plan : kcl 40 meq po one  dose           BMET in am     LOS: 14 days   Sincere Berlanga S 04/28/2013,10:07 AM

## 2013-04-29 LAB — BASIC METABOLIC PANEL
BUN: 31 mg/dL — ABNORMAL HIGH (ref 6–23)
CO2: 41 mEq/L (ref 19–32)
Calcium: 8.8 mg/dL (ref 8.4–10.5)
GFR calc non Af Amer: 35 mL/min — ABNORMAL LOW (ref >90)
Glucose, Bld: 149 mg/dL — ABNORMAL HIGH (ref 70–99)
Potassium: 3.8 mEq/L (ref 3.5–5.1)

## 2013-04-29 LAB — GLUCOSE, CAPILLARY

## 2013-04-29 LAB — CBC
HCT: 27.1 % — ABNORMAL LOW (ref 36.0–46.0)
Hemoglobin: 8.3 g/dL — ABNORMAL LOW (ref 12.0–15.0)
MCH: 29.1 pg (ref 26.0–34.0)
MCHC: 30.6 g/dL (ref 30.0–36.0)

## 2013-04-29 MED ORDER — FUROSEMIDE 20 MG PO TABS: 40.0000 mg | ORAL_TABLET | Freq: Every day | ORAL | Status: DC

## 2013-04-29 MED ORDER — FUROSEMIDE 20 MG PO TABS: 20.0000 mg | ORAL_TABLET | Freq: Two times a day (BID) | ORAL | Status: DC | PRN

## 2013-04-29 MED ORDER — METOPROLOL TARTRATE 100 MG PO TABS: 100.0000 mg | ORAL_TABLET | Freq: Two times a day (BID) | ORAL | Status: DC

## 2013-04-29 NOTE — Discharge Summary (Addendum)
Physician Discharge Summary  Amber Armstrong VWU:[id number] DOB: 1951-02-16 DOA: 04/14/2013  PCP: Pershing Proud  Admit date: 04/14/2013 Discharge date: 04/29/2013  Time spent: 45 minutes  Recommendations for Outpatient Follow-up:  1. Follow up with PCP in 1-2 weeks  2. Follow up with Nephrology 3. Follow up with Cardiology 4. Follow up with Surgery  Discharge Diagnoses:  Active Problems:   DIABETES MELLITUS, TYPE II   HYPERLIPIDEMIA   Morbid obesity with BMI of 40.0-44.9, adult   HYPERTENSION   FIBRILLATION, ATRIAL   Long term current use of anticoagulant   CHF exacerbation   Acute respiratory failure   Acute renal failure   Acute diastolic heart failure   Hematoma of groin   Pseudoaneurysm   Acute blood loss anemia   Hemorrhagic shock  Discharge Condition: stable  Diet recommendation: heart healthy, carb modified  Filed Weights   04/27/13 0534 04/28/13 0537 04/29/13 0503  Weight: 93.713 kg (206 lb 9.6 oz) 93.577 kg (206 lb 4.8 oz) 90.81 kg (200 lb 3.2 oz)   History of present illness:  Amber Armstrong is a 62 y.o. female who presents to the emergency room with diffuse edema and shortness of breath. Patient reports that her generalized edema started a few weeks ago. This has progressively gotten worse. She denies any chest pain. She has noted increasingly short of breath. She describes PND and orthopnea. She's noticing steady increase in her weight. Her edema has gotten to the point that her arms/legs are weeping. She reports compliant with salt and fluid intake. She was taking 20 mg of Lasix daily. This was increased to 20 mg twice a day approximately 3 days ago by her primary care physician. She reports compliance with her medications. She denies any fever, cough, vomiting or diarrhea. She was evaluated in the emergency room and noted to be in congestive heart failure. She was hypoxic on room air requiring supplemental oxygen. She is admitted to the hospital further  treatments.  Hospital Course:  Briefly, patient was admitted to the hospital with shortness of breath due to CHF exacerbation. She was started on intravenous lasix but did not have any significant response. On 6/25 patient was found to be unresponsive and had a significant respiratory acidosis with a pco2 of 125 and pH of 7.06. She was transferred to the ICU and intubated. She was followed by cardiology for CHF, pulmonology for ventilator management and nephrology for acute renal failure. Patient was found to be hypotensive, so femoral central line was placed by general surgery for vasopressor support to aide with diuresis. Patient began to experience good response to lasix and overall condition was starting to improve. Creatinine also began to improve with diuresis. Patient began to experience thick secretions from ET tube, and chest xray raised concerns for possible developing pneumonia. She was started empirically on vancomycin and levofloxacin. Patient subsequently had a picc line placed for continued central access. Once Picc line was placed, femoral central line was discontinued. Unfortunately, patient had significant bleeding from right groin and developed a large right groin hematoma with pseudoaneurysm. This was also complicated by the fact that patient was on full dose lovenox for atrial fibrillation. This eventually required operative repair by Dr. Leticia Penna. Patient was transfused multiple units of FFP and PRBC for bleeding and anemia. At this time, it appears that her bleeding has resolved. By 7/1 morning, patient was able to be successfully weaned off the ventilator. Patient on the floor since 7/1, weaned off TPN. Lasix transitioned to po  on 7/2. Antibiotics discontinued 7/5 after 10 days.   Per problem list:  Right leg swelling - likely related to her groin complications, [place] negative for DVT.  Acute on chronic respiratory failure, multifactorial. Patient extubated off the ventilator 7/1.  ventilator. Continuing diuresis, transitioned to po Lasix 7/2 per renal. Still on O2, wean off as tolerated. Encourage ambulation with PT  HCAP - on antibiotics since 6/25, s/p 10 days of treatment.  Acute right-sided congestive heart failure. Edema improving. Continue diuresis and monitor BMP. Acute renal failure. Creatinine stable, likely at her new baseline, Nephrology was following inhouse. Continue to monitor urine output and BMP. Atrial fibrillation. Patient is having episodes of intermittent tachycardia. Anticoagulation has been discontinued due to bleeding from right groin site, that needs to be reconsidered in the future. Metoprolol uptitrated to 100 mg BID with normal blood pressure and heart rate into 70-90s.  S/p Hemorrhagic shock. Due to bleeding from right groin site. Blood pressure better. Status post 5 units packed red blood cells  Acute blood loss anemia. Due to bleeding from right groin site. Patient received 5 units of PRBCs and 2 units of FFP during this hospitalization. Hemoglobin unchanged  Type 2 diabetes. Blood sugars have been stable.  Acute right groin hematoma with pseudoaneurysm. Patient had significant bleeding from right groin when her femoral central line was discontinued, complicated by the fact that she was anticoagulated with Lovenox. Patient received protamine as well as FFP to reverse her coagulopathy. She had significant bleeding requiring transfusion of PRBCs and FFPs. Ultimately, she required surgical repair by Dr. Leticia Penna to achieve hemostasis. Stable, patient will need outpatient follow up.  Nutrition: started oral intake, d/c TPN.   Recomendations - daily weights, if weight gain 2 lbs in 24 hour and/or worsening LE edema please discuss with MD about using additional Lasix.  - monitor renal function twice weekly - follow up with multiple providers as instructed. - per cardiology recommendations, would resume coumadin next week (July 16) given R femoral artery  bleed. Please discuss this with surgery when seen next   Procedures:  2D echo Study Conclusions  - Left ventricle: The cavity size was normal. There was mild concentric hypertrophy. Systolic function was vigorous. The estimated ejection fraction was in the range of 65% to 70%. Wall motion was normal; there were no regional wall motion abnormalities. The study is not technically sufficient to allow evaluation of LV diastolic function. Ventricular septum: The contour showed diastolic flattening and systolic flattening consistent with RV pressure and volume overload. Aortic valve: Mildly calcified annulus. Trileaflet. No ignificant regurgitation. Mitral valve: Mildly thickened leaflets . Trivial regurgitation. Left atrium: The atrium was moderately dilated. Right ventricle: The cavity size was severely dilated. Systolic function was low normal - appears reduced further in apical views with hypokinesis to akinesis of distal free wall. Right atrium: The atrium was moderately dilated. Tricuspid valve: Suspect at least moderate regurgitation with fairly laminar flow noted across tricuspid valve due to incomplete coaptation. Peak RV-RA gradient: 33mm Hg (S). Pulmonic valve: Mild regurgitation. Pulmonary arteries: Systolic pressure was likely moderately increased if CVP elevated (could not estimate). Inferior vena cava: Unable to estimate CVP. Pericardium, extracardiac: There was no pericardial effusion.  Consultations:  Cardiology  Surgery  Nephrology  Discharge Exam: Filed Vitals:   04/28/13 1838 04/28/13 2125 04/29/13 0503 04/29/13 0939  BP: 121/81 158/80  106/63  Pulse:  99 74 97  Temp:  98.7 F (37.1 C) 98.2 F (36.8 C)   TempSrc:  Oral  Oral   Resp:  20 20   Height:      Weight:   90.81 kg (200 lb 3.2 oz)   SpO2:  100% 91%    General: NAD Cardiovascular: irregularly irregular Respiratory: CTA biL  Discharge Instructions       Future Appointments Provider Department Dept Phone    05/22/2013 1:00 PM Dyann Kief, PA-C [facility] [place] [phone]   05/22/2013 1:40 PM [facility] [place] [phone]       Medication List    STOP taking these medications       metoprolol 200 MG 24 hr tablet  Commonly known as:  TOPROL-XL     TAZTIA XT 360 MG 24 hr capsule  Generic drug:  diltiazem     warfarin 2.5 MG tablet  Commonly known as:  COUMADIN      TAKE these medications       bimatoprost 0.03 % ophthalmic solution  Commonly known as:  LUMIGAN  Place 1 drop into both eyes at bedtime.     furosemide 20 MG tablet  Commonly known as:  LASIX  Take 2 tablets (40 mg total) by mouth daily.     glimepiride 2 MG tablet  Commonly known as:  AMARYL  Take 2 mg by mouth daily before breakfast.     insulin glargine 100 UNIT/ML injection  Commonly known as:  LANTUS  Inject 30 Units into the skin at bedtime.     levothyroxine 200 MCG tablet  Commonly known as:  SYNTHROID, LEVOTHROID  Take 200 mcg by mouth daily before breakfast.     metoprolol 100 MG tablet  Commonly known as:  LOPRESSOR  Take 1 tablet (100 mg total) by mouth 2 (two) times daily.       Follow-up Information   Follow up with [facility], MD In 4 weeks.   Contact information:   [address] [place] [address] [phone]       Follow up with Terie Purser, PA-C. Schedule an appointment as soon as possible for a visit in 2 weeks.   Contact information:   [address] [place] [address] [phone]       Follow up with Bath Bing, MD. Schedule an appointment as soon as possible for a visit in 2 weeks.   Contact information:   [address] [place] [address] [phone]       Follow up with Fabio Bering, MD. Schedule an appointment as soon as possible for a visit in 2 weeks.   Contact information:   [address] [place] [address] [phone]       The results of  significant diagnostics from this hospitalization (including imaging, microbiology, ancillary and laboratory) are listed below for reference.    Significant Diagnostic Studies: Ct Head Wo Contrast  04/17/2013   *RADIOLOGY REPORT*  Clinical Data: Hypertension.  Diabetes.  Atrial fibrillation. Unresponsive.  CT HEAD WITHOUT CONTRAST  Technique:  Contiguous axial images were obtained from the base of the skull through the vertex without contrast.  Comparison: None.  Findings: There is no evidence of old or acute focal infarction. No mass lesion, hemorrhage, hydrocephalus or extra-axial collection.  The calvarium is unremarkable.  There are these mucosal inflammation and within the paranasal sinuses.  IMPRESSION: No acute or focal brain pathology.  Normal appearing brain for age by CT.  Some inflammatory change of the paranasal sinuses.   Original Report Authenticated By: Paulina Fusi, M.D.  US Renal  04/17/2013   *RADIOLOGY REPORT*  Clinical Data: 62 year old female with renal failure.  The patient is unresponsive.  RENAL/URINARY TRACT ULTRASOUND COMPLETE  Comparison:  None.  Findings:  Right Kidney:  No hydronephrosis.  Renal cortical thinning.  Renal length 8.4 cm.  Left Kidney:  Difficult to visualize.  No definite hydronephrosis. Approximate renal length 9.3 cm.  Bladder:  Decompressed and Foley catheter balloon visible.  Other findings:  Evidence of ascites in the pelvis.  IMPRESSION: 1.  No definite acute renal findings.  Right renal cortical atrophy.  The left kidney is difficult to visualize. 2.  Ascites in the pelvis.   Original Report Authenticated By: Erskine Speed, M.D.   US Venous Img Lower Unilateral Right  04/27/2013   *RADIOLOGY REPORT*  Clinical Data: Right leg pain.  RIGHT LOWER EXTREMITY VENOUS DUPLEX ULTRASOUND  Technique:  Gray-scale sonography with graded compression, as well as color Doppler and duplex ultrasound were performed to evaluate the deep venous system of the lower extremity from  the level of the common femoral vein through the popliteal and proximal calf veins. Spectral Doppler was utilized to evaluate flow at rest and with distal augmentation maneuvers.  Comparison:  None.  Findings:  There is a complex fluid collection in the right groin that is reportedly related to recent surgery.  Findings probably represent a hematoma without significant flow.  As a result, the right common femoral vein, profunda femoral vein and proximal femoral vein were not visualized.  Normal compressibility, augmentation and color Doppler flow in the mid and distal femoral vein.  Normal compressibility, color Doppler flow and augmentation in the popliteal vein.  Visualized calf veins are patent.  There is diffuse subcutaneous edema.  IMPRESSION: No evidence for deep vein thrombosis in the visualized right lower extremity deep veins.  Please note that the right common femoral vein, profunda femoral vein and proximal superficial vein were not visualized due to postsurgical changes and probable hematoma in the right groin.   Original Report Authenticated By: Richarda Overlie, M.D.   [place] Lower Ext Art Right Ltd  04/20/2013   *RADIOLOGY REPORT*  Clinical Data: Expanding the right groin hematoma after catheter removal  RIGHT LOWER EXTREMITY ARTERIAL DUPLEX EVAL  Comparison: None.  Findings:  Provided gray scale images demonstrate a large mixed echogenic collection within the right groin measuring approximately 20 x 14.5 x 12.6 cm.  There is layering echogenic debris within the dependent portion of this structure.  Provided images demonstrate apparent communication between this structure and the adjacent femoral artery though evaluation is somewhat degraded secondary to the large size of this mixed echogenic collection.   A small amount of apparent to and fro flow was noted within the apparent communication between the structure in the adjacent femoral artery (image 9).  A small amount of arterial flow is noted within the  caudal aspect of this collection (image 5).  IMPRESSION: Large, approximately 20 cm narrow neck pseudoaneurysm/hematoma within the right groin adjacent to the femoral artery.  Ordering physician Dr. Leticia Penna there was present at the time of the ultrasound and is aware of the above findings.   Original Report Authenticated By: Tacey Ruiz, MD   Dg Chest Port 1 View  04/24/2013   *RADIOLOGY REPORT*  Clinical Data: Respiratory failure, follow-up  PORTABLE CHEST - 1 VIEW  Comparison: Portable chest x-ray of 04/23/2013  Findings: The lungs appears slightly better aerated.  There is pulmonary vascular congestion remaining,  possibly slightly increased, with  basilar atelectasis and/or small effusions. Cardiomegaly is stable.  Left PICC line remains.  IMPRESSION: Minimal improvement in aeration.  Slight worsening of pulmonary vascular congestion with basilar opacities consistent with atelectasis and/or effusion.   Original Report Authenticated By: Dwyane Dee, M.D.   Dg Chest Port 1 View  04/23/2013   *RADIOLOGY REPORT*  Clinical Data: Respiratory failure  PORTABLE CHEST - 1 VIEW  Comparison: 04/22/2013  Findings: Left PICC line in unchanged position.  The patient has been extubated.  Cardiac silhouette is mildly to moderately enlarged.  There is mild central vascular congestion.  There is mild diffuse interstitial prominence.  There is a left lower lobe retrocardiac opacity which appears stable.  There is opacity in the medial right middle lobe, also unchanged.  IMPRESSION: Cardiac enlargement with vascular congestion and bilateral infiltrates suggesting pulmonary edema in the setting of congestive heart failure.  No significant change status post extubation.   Original Report Authenticated By: Esperanza Heir, M.D.   Dg Chest Port 1 View  04/22/2013   *RADIOLOGY REPORT*  Clinical Data: Line placement  PORTABLE CHEST - 1 VIEW  Comparison: Portable exam 1247 hours compared to 04/22/2013  Findings: Tip of the left arm  PICC line projects over SVC near cavoatrial junction. Enlargement of cardiac silhouette with pulmonary vascular congestion. Diffuse infiltrates most consistent with pulmonary edema and CHF. Tip of endotracheal tube in satisfactory position above carina. Probable small left pleural effusion. No pneumothorax.  IMPRESSION: Persistent CHF. Tip of left arm PICC line projects over SVC near the cavoatrial junction.   Original Report Authenticated By: Ulyses Southward, M.D.   Dg Chest Port 1 View  04/22/2013   *RADIOLOGY REPORT*  Clinical Data: Ventilated patient.  Ventilator protocol.  Follow-up radiograph.  Assess support apparatus.  PORTABLE CHEST - 1 VIEW  Comparison: 04/21/2013, 0845 hours.  Findings: The cardiopericardial silhouette is partially obscured but appears enlarged.  Endotracheal tube tip 31 mm from carina. Left upper extremity PICC is present with the tip at the cavoatrial junction.  Low volume chest.  Shifting airspace disease is present compatible with pulmonary edema.  Underlying pneumonia cannot be excluded.  The layering of a pleural effusions.  IMPRESSION:  1.  Stable support apparatus. 2.  Shifting bilateral airspace disease most compatible with pulmonary edema.  Superimposed basilar atelectasis and shifting pleural effusions.   Original Report Authenticated By: Andreas Newport, M.D.   Dg Chest Port 1 View  04/21/2013   *RADIOLOGY REPORT*  Clinical Data:  Respiratory failure.  PORTABLE CHEST - 1 VIEW  Comparison: 04/20/2013.  None.  Findings: The endotracheal tube and left PICC line are unchanged in position.  Heart size remains enlarged.  Edema throughout the right lung is stable. There is moderate worsening of edema throughout the left lung.  There are no other changes.  There are no effusions or pneumothoraces.  There are no acute bony changes.  IMPRESSION: Worsening edema throughout the left lung compared to the preceding study.   Original Report Authenticated By: Sander Radon, M.D.   Dg  Chest Port 1 View  04/20/2013   *RADIOLOGY REPORT*  Clinical Data: Respiratory failure  PORTABLE CHEST - 1 VIEW  Comparison: Yesterday  Findings: Endotracheal tube and left PICC are stable.  Diffuse edema is worse.  Cardiomegaly.  Left basilar opacity.  No pneumothorax.  IMPRESSION: Worsening edema.   Original Report Authenticated By: Jolaine Click, M.D.   Dg Chest Port 1 View  04/19/2013   *RADIOLOGY REPORT*  Clinical Data: Status post PICC placement  PORTABLE CHEST - 1 VIEW  Comparison: April 18, 2013.  Findings: Stable moderate cardiomegaly.  Endotracheal tube is in grossly good position and unchanged.  Central pulmonary vascular congestion is again noted.  Bilateral basilar opacities are again noted and unchanged.  There has been interval placement of left- sided PICC line with distal tip in the expected position of the right atrium.  IMPRESSION: Stable cardiomegaly and central pulmonary vascular congestion. Persistent bilateral basilar opacities which are unchanged. Interval placement of left-sided PICC line with distal tip in right atrium.  It is recommended to be withdrawn at least 2-3 cm.   Original Report Authenticated By: Lupita Raider.,  M.D.   Dg Chest Port 1 View  04/18/2013   *RADIOLOGY REPORT*  Clinical Data: Follow up pneumonia and CHF  PORTABLE CHEST - 1 VIEW  Comparison: Portable exam 0917 hours compared to 04/17/2013  Findings: Tip of endotracheal tube 3.0 cm above carina. Nasogastric tube extends into abdomen. Underpenetration at lung bases. Enlargement of cardiac silhouette. Pulmonary vascular congestion. Diffuse airspace and interstitial infiltrates consistent with pulmonary edema and CHF. Question coexistent consolidation or atelectasis in the lower lobes. No pneumothorax.  IMPRESSION: Diffuse pulmonary edema/CHF. Question coexistent consolidation versus atelectasis in the lower lobes.   Original Report Authenticated By: Ulyses Southward, M.D.   Portable Chest Xray  04/17/2013   *RADIOLOGY  REPORT*  Clinical Data: ET tube placement.  PORTABLE CHEST - 1 VIEW  Comparison: 04/14/2013  Findings: Endotracheal tube has been placed and is at the level of the carina directed toward the right main stem bronchus.  Recommend retracting 3 cm.  Cardiomegaly with vascular congestion and mild perihilar and lower lobe opacities.  No visible effusions.  No acute bony abnormality.  IMPRESSION: Endotracheal tube at the level of the carina directed toward the right mainstem bronchus.  Recommend retracting 3 cm.  Cardiomegaly.  Bilateral perihilar and lower lobe airspace opacities, likely edema.   Original Report Authenticated By: Charlett Nose, M.D.   Dg Chest Portable 1 View  04/14/2013   *RADIOLOGY REPORT*  Clinical Data: Short of breath  PORTABLE CHEST - 1 VIEW  Comparison: None.  Findings: Cardiac silhouette is enlarged.  There is central venous pulmonary congestion.  The lung bases are poorly evaluated due to technique and body habitus.  Likely bilateral effusions.  IMPRESSION: Cardiomegaly, central venous congestion. and  bilateral effusions consistent congestive heart failure.   Original Report Authenticated By: Genevive Bi, M.D.   Dg Chest Port 1v Same Day  04/17/2013   *RADIOLOGY REPORT*  Clinical Data: Nasogastric tube placement.  PORTABLE CHEST - 1 VIEW SAME DAY  Comparison: 04/17/2013.  Findings: Nasogastric tube is followed into the stomach. Endotracheal tube terminates at the orifice of the right mainstem bronchus.  Heart appears enlarged, stable.  Bibasilar dependent air space disease appears slightly progressive.  IMPRESSION:  1.  Endotracheal tube is at the wall of the right mainstem bronchus.  Retracting approximately 3 cm would better position the tip above the carina. These results will be called to the ordering clinician or representative by the Radiologist Assistant, and communication documented in the PACS Dashboard. 2.  Nasogastric tube is followed into the stomach. 3.  Bibasilar dependent  air space disease, worsening.   Original Report Authenticated By: Leanna Battles, M.D.   Labs: Basic Metabolic Panel:  Recent Labs Lab 04/23/13 0451  04/25/13 1544 04/26/13 0500 04/27/13 0804 04/27/13 1252 04/28/13 0541 04/29/13 0525  NA 139  < > 135 138 141  --  139  143  K 3.4*  < > 3.5 3.6 3.4*  --  3.4* 3.8  CL 95*  < > 90* 92* 94*  --  93* 95*  CO2 41*  < > 40* 39* 42*  --  42* 41*  GLUCOSE 168*  < > 213* 185* 155*  --  167* 149*  BUN 29*  < > 29* 29* 28*  --  30* 31*  CREATININE 1.50*  < > 1.30* 1.34* 1.40*  --  1.48* 1.53*  CALCIUM 8.2*  < > 8.6 8.3* 8.6  --  8.7 8.8  MG 1.7  --   --   --   --   --   --   --   PHOS 2.0*  --   --   --   --  3.1  --   --   < > = values in this interval not displayed. Liver Function Tests:  Recent Labs Lab 04/23/13 0451  AST 18  ALT 10  ALKPHOS 67  BILITOT 1.2  PROT 5.3*  ALBUMIN 2.3*   CBC:  Recent Labs Lab 04/23/13 0451  04/25/13 0518 04/26/13 0500 04/27/13 0804 04/28/13 0541 04/29/13 0525  WBC 13.2*  < > 12.8* 12.1* 10.2 9.8 9.3  NEUTROABS 9.7*  --   --   --   --   --   --   HGB 8.1*  < > 8.7* 8.2* 8.4* 8.0* 8.3*  HCT 24.6*  < > 27.7* 26.2* 26.6* 26.4* 27.1*  MCV 89.8  < > 92.6 92.9 93.7 94.6 95.1  PLT 105*  < > 149* 168 192 200 224  < > = values in this interval not displayed.  BNP: BNP (last 3 results)  Recent Labs  04/14/13 1030 04/26/13 0500  PROBNP 12159.0* 7558.0*   CBG:  Recent Labs Lab 04/28/13 0749 04/28/13 1148 04/28/13 1633 04/28/13 2103 04/29/13 0703  GLUCAP 154* 172* 185* 134* 167*    Signed:  Arrion Broaddus  [facility] 04/29/2013, 11:25 AM

## 2013-04-29 NOTE — Clinical Social Work Placement (Signed)
Clinical Social Work Department CLINICAL SOCIAL WORK PLACEMENT NOTE 04/29/2013  Patient:  Amber Armstrong, Amber Armstrong  Account Number:  [id number] Admit date:  04/14/2013  Clinical Social Worker:  Santa Genera, CLINICAL SOCIAL WORKER  Date/time:  04/23/2013 11:30 PM  Clinical Social Work is seeking post-discharge placement for this patient at the following level of care:   SKILLED NURSING   (*CSW will update this form in Epic as items are completed)   04/23/2013  Patient/family provided with [facility] Department of Clinical Social Work's list of facilities offering this level of care within the geographic area requested by the patient (or if unable, by the patient's family).  04/23/2013  Patient/family informed of their freedom to choose among providers that offer the needed level of care, that participate in Medicare, Medicaid or managed care program needed by the patient, have an available bed and are willing to accept the patient.  04/23/2013  Patient/family informed of [facility]' ownership interest in [facility], as well as of the fact that they are under no obligation to receive care at this facility.  PASARR submitted to EDS on 04/23/2013 PASARR number received from EDS on 04/23/2013  FL2 transmitted to all facilities in geographic area requested by pt/family on  04/23/2013 FL2 transmitted to all facilities within larger geographic area on   Patient informed that his/her managed care company has contracts with or will negotiate with  certain facilities, including the following:     Patient/family informed of bed offers received:  04/24/2013 Patient chooses bed at [facility] [place] Physician recommends and patient chooses bed at  [facility] [place]  Patient to be transferred to [facility] [place] on  04/29/2013 Patient to be transferred to facility by [facility]  The following physician request were entered in Epic:   Additional Comments: Patient has [employer]  [place] PPO.  Precert number is [id number] manager is Octavio Graves ([phone] Patient and family asked to contact [facility] for information on financial arrangements, per facility patient has $2000 left to pay on deductible.  Patient aware and will ask [facility] for payment plan.  Derenda Fennel, [place] [phone]

## 2013-04-29 NOTE — Progress Notes (Signed)
Subjective: Patient feels great.  No SOB or CP. Objective: Filed Vitals:   04/28/13 1815 04/28/13 1838 04/28/13 2125 04/29/13 0503  BP: 155/101 121/81 158/80   Pulse:   99 74  Temp:   98.7 F (37.1 C) 98.2 F (36.8 C)  TempSrc:   Oral Oral  Resp:   20 20  Height:      Weight:    200 lb 3.2 oz (90.81 kg)  SpO2:   100% 91%   Weight change: -6 lb 1.6 oz (-2.767 kg)  Intake/Output Summary (Last 24 hours) at 04/29/13 0917 Last data filed at 04/29/13 0600  Gross per 24 hour  Intake    520 ml  Output    950 ml  Net   -430 ml    General: Alert, awake, oriented x3, in no acute distress Neck:  JVP is normal Heart: Regular rate and rhythm, without murmurs, rubs, gallops.  Lungs: Clear to auscultation.  No rales or wheezes. Exemities:  1+ edema.   Neuro: Grossly intact, nonfocal.   Lab Results: Results for orders placed during the hospital encounter of 04/14/13 (from the past 24 hour(s))  GLUCOSE, CAPILLARY     Status: Abnormal   Collection Time    04/28/13 11:48 AM      Result Value Range   Glucose-Capillary 172 (*) 70 - 99 mg/dL  GLUCOSE, CAPILLARY     Status: Abnormal   Collection Time    04/28/13  4:33 PM      Result Value Range   Glucose-Capillary 185 (*) 70 - 99 mg/dL  GLUCOSE, CAPILLARY     Status: Abnormal   Collection Time    04/28/13  9:03 PM      Result Value Range   Glucose-Capillary 134 (*) 70 - 99 mg/dL   Comment 1 Notify RN    PROTIME-INR     Status: Abnormal   Collection Time    04/29/13  5:25 AM      Result Value Range   Prothrombin Time 15.5 (*) 11.6 - 15.2 seconds   INR 1.26  0.00 - 1.49  CBC     Status: Abnormal   Collection Time    04/29/13  5:25 AM      Result Value Range   WBC 9.3  4.0 - 10.5 K/uL   RBC 2.85 (*) 3.87 - 5.11 MIL/uL   Hemoglobin 8.3 (*) 12.0 - 15.0 g/dL   HCT 16.1 (*) 09.6 - 04.5 %   MCV 95.1  78.0 - 100.0 fL   MCH 29.1  26.0 - 34.0 pg   MCHC 30.6  30.0 - 36.0 g/dL   RDW 40.9 (*) 81.1 - 91.4 %   Platelets 224  150 - 400  K/uL  BASIC METABOLIC PANEL     Status: Abnormal   Collection Time    04/29/13  5:25 AM      Result Value Range   Sodium 143  135 - 145 mEq/L   Potassium 3.8  3.5 - 5.1 mEq/L   Chloride 95 (*) 96 - 112 mEq/L   CO2 41 (*) 19 - 32 mEq/L   Glucose, Bld 149 (*) 70 - 99 mg/dL   BUN 31 (*) 6 - 23 mg/dL   Creatinine, Ser 7.82 (*) 0.50 - 1.10 mg/dL   Calcium 8.8  8.4 - 95.6 mg/dL   GFR calc non Af Amer 35 (*) >90 mL/min   GFR calc Af Amer 41 (*) >90 mL/min  GLUCOSE, CAPILLARY     Status:  Abnormal   Collection Time    04/29/13  7:03 AM      Result Value Range   Glucose-Capillary 167 (*) 70 - 99 mg/dL   Comment 1 Notify RN      Studies/Results: @RISRSLT24 @  Medications: {Reviewed.  @PROBHOSP @  1.  Afib  Rates controlled in 80s.  Would resume coumadin next week (July 16) given R femoral artery bleed.  WIll arrange for outpatient f/u  2.  CHF  More R sided.   Volume is up a little but much improved from admit  Patient is net negative 13L.  Would favor Lasix 40 qd given renal function.    3.  ANemia  Very mild increase in Hgb  Will need to be followed  ? If needs Fe supplement  4.  HTN  BP is a bit labile  High earlier.  If remains high would add Hydralazine to regimen 25 tid.  Pt being tx'd t o SNF for rehab      Appt set in [facility] clinic for July 30 at 1 PM  With Herma Carson.    LOS: 15 days   Dietrich Pates 04/29/2013, 9:17 AM

## 2013-04-29 NOTE — Progress Notes (Signed)
Nutrition Follow-up  INTERVENTION:   Pt discharged to [facility]: Please consider discharging pt on pureed diet (her mouth continues to be sore and gums swollen per pt), daily MVI and continue Glucerna BID.  NUTRITION DIAGNOSIS: Inadequate oral intake;improving  Goal: Pt will successfully transition from TPN  to meet > 90% of her nutritional needs via oral intake; goal met.  Monitor:  Nutrition care plan progression -pt d/c to SNF  62 y.o. female  ASSESSMENT: Pt ate well over the weekend and is drinking 75-100% of Glucerna. BM noted on 04/28/13. Recommend she continue with current nutrition regimen.  Height: Ht Readings from Last 1 Encounters:  04/22/13 5' (1.524 m)    Weight: Wt Readings from Last 1 Encounters:  04/29/13 200 lb 3.2 oz (90.81 kg)    Ideal Body Weight: 100# (45.4 kg)  % Ideal Body Weight: 219%  Wt Readings from Last 10 Encounters:  04/29/13 200 lb 3.2 oz (90.81 kg)  04/29/13 200 lb 3.2 oz (90.81 kg)  12/12/12 202 lb 1.3 oz (91.663 kg)  04/11/12 204 lb (92.534 kg)  03/28/12 205 lb (92.987 kg)  04/19/11 201 lb (91.173 kg)  09/23/10 201 lb (91.173 kg)  06/23/10 201 lb (91.173 kg)  06/14/10 199 lb (90.266 kg)  12/10/09 206 lb (93.441 kg)    Usual Body Weight: 200-205#  % Usual Body Weight: 109%  BMI:  Body mass index is 39.1 kg/(m^2). Extreme Obesity Class III (skewed due to pt fluid status)  Estimated Nutritional Needs: Kcal: 1200-1500 Protein: 90-112 gr Fluid: per MD goals  Skin: No issues noted  Diet Order: Carb Control  EDUCATION NEEDS: -No education needs identified at this time   Intake/Output Summary (Last 24 hours) at 04/29/13 1045 Last data filed at 04/29/13 0600  Gross per 24 hour  Intake    520 ml  Output    950 ml  Net   -430 ml    Last BM: 04/28/13  Labs:   Recent Labs Lab 04/23/13 0451  04/27/13 0804 04/27/13 1252 04/28/13 0541 04/29/13 0525  NA 139  < > 141  --  139 143  K 3.4*  < > 3.4*  --  3.4* 3.8  CL 95*  <  > 94*  --  93* 95*  CO2 41*  < > 42*  --  42* 41*  BUN 29*  < > 28*  --  30* 31*  CREATININE 1.50*  < > 1.40*  --  1.48* 1.53*  CALCIUM 8.2*  < > 8.6  --  8.7 8.8  MG 1.7  --   --   --   --   --   PHOS 2.0*  --   --  3.1  --   --   GLUCOSE 168*  < > 155*  --  167* 149*  < > = values in this interval not displayed.  CBG (last 3)   Recent Labs  04/28/13 1633 04/28/13 2103 04/29/13 0703  GLUCAP 185* 134* 167*    Scheduled Meds: . antiseptic oral rinse  15 mL Mouth Rinse BID  . bimatoprost  1 drop Both Eyes QHS  . feeding supplement  237 mL Oral BID BM  . insulin aspart  0-15 Units Subcutaneous TID WC  . insulin aspart  0-15 Units Subcutaneous TID WC  . insulin aspart  0-5 Units Subcutaneous QHS  . levothyroxine  200 mcg Oral QAC breakfast  . metoprolol tartrate  100 mg Oral BID  . multivitamin with minerals  1  tablet Oral Daily  . nystatin   Topical BID  . pantoprazole  40 mg Oral Daily  . polyethylene glycol  17 g Oral BID  . sodium chloride  10-40 mL Intracatheter Q12H    Continuous Infusions:    Past Medical History  Diagnosis Date  . Hypertension   . Type 2 diabetes mellitus     Type II  . Atrial fibrillation   . Nephrolithiasis   . Overweight(278.02)   . UTI (urinary tract infection)   . Hypothyroidism (acquired)     Past Surgical History  Procedure Laterality Date  . Thyroidectomy    . Groin dissection Right 04/20/2013    Procedure: GROIN EXPLORATION;  Surgeon: Fabio Bering, MD;  Location: [facility];  Service: General;  Laterality: Right;    Royann Shivers MS,RD,LDN,CSG Office: [phone] Pager: [phone]

## 2013-04-29 NOTE — Progress Notes (Signed)
Subjective: Interval History: Patient presently alert and awake. Presently denies any nausea or vomiting and no dificutly in breathing. Patient offers no complaints. Objective: Vital signs in last 24 hours: Temp:  [98.2 F (36.8 C)-98.7 F (37.1 C)] 98.2 F (36.8 C) (07/07 0503) Pulse Rate:  [74-110] 74 (07/07 0503) Resp:  [20] 20 (07/07 0503) BP: (121-158)/(80-101) 158/80 mmHg (07/06 2125) SpO2:  [91 %-100 %] 91 % (07/07 0503) Weight:  [90.81 kg (200 lb 3.2 oz)] 90.81 kg (200 lb 3.2 oz) (07/07 0503) Weight change: -2.767 kg (-6 lb 1.6 oz)  Intake/Output from previous day: 07/06 0701 - 07/07 0700 In: 520 [P.O.:480; I.V.:40] Out: 950 [Urine:950] Intake/Output this shift:    Generally patient is awake and alert. Does not seem to be in any apparent distress. Chest decreased breath sound bilaterally and anteriorly. She has no rales or rhonchi bilaterally. Heart irregular rate and rhythm no murmur. Her heart rate is controlled Abdomen soft positive bowel sound Extremities  Trace on the left  Edema and 1 to 2+plus edema on the right   Lab Results:  Recent Labs  04/28/13 0541 04/29/13 0525  WBC 9.8 9.3  HGB 8.0* 8.3*  HCT 26.4* 27.1*  PLT 200 224   BMET:   Recent Labs  04/28/13 0541 04/29/13 0525  NA 139 143  K 3.4* 3.8  CL 93* 95*  CO2 42* 41*  GLUCOSE 167* 149*  BUN 30* 31*  CREATININE 1.48* 1.53*  CALCIUM 8.7 8.8   No results found for this basename: PTH,  in the last 72 hours Iron Studies: No results found for this basename: IRON, TIBC, TRANSFERRIN, FERRITIN,  in the last 72 hours  Studies/Results: US Venous Img Lower Unilateral Right  04/27/2013   *RADIOLOGY REPORT*  Clinical Data: Right leg pain.  RIGHT LOWER EXTREMITY VENOUS DUPLEX ULTRASOUND  Technique:  Gray-scale sonography with graded compression, as well as color Doppler and duplex ultrasound were performed to evaluate the deep venous system of the lower extremity from the level of the common femoral  vein through the popliteal and proximal calf veins. Spectral Doppler was utilized to evaluate flow at rest and with distal augmentation maneuvers.  Comparison:  None.  Findings:  There is a complex fluid collection in the right groin that is reportedly related to recent surgery.  Findings probably represent a hematoma without significant flow.  As a result, the right common femoral vein, profunda femoral vein and proximal femoral vein were not visualized.  Normal compressibility, augmentation and color Doppler flow in the mid and distal femoral vein.  Normal compressibility, color Doppler flow and augmentation in the popliteal vein.  Visualized calf veins are patent.  There is diffuse subcutaneous edema.  IMPRESSION: No evidence for deep vein thrombosis in the visualized right lower extremity deep veins.  Please note that the right common femoral vein, profunda femoral vein and proximal superficial vein were not visualized due to postsurgical changes and probable hematoma in the right groin.   Original Report Authenticated By: Richarda Overlie, M.D.    I have reviewed the patient's current medications.  Assessment/Plan: Problem #1 acute kidney injury superimposed on chronic her BUN and creatinine presently is improving. BUN is 31 and creatine is 1.53. Patient is none oliguric her BUN and creatinine seems to be slightly increasing. Possibly secondary to diuretics. Problem #2 CHF patient on lasix CHF  Is improving Problem #3 hypokalemia related most likely to her diuretics. pottassuim has corrected. Problem #4 diabetes Problem #5 a trial fibrillation her  heart rate seems fluctuating and remains low 100's Problem #6 respiratory failure possibly combination of CHF and also pneumonia. Patient is presently remains intubated.  Problem #7 hyperlipidemia Problem #8 obesity Problem #9 hypertension presently patient blood pressure seems to be low normal and stable Plan : We'll DC Lasix and potassium supplement.            BMET in am     LOS: 15 days   Sedra Morfin S 04/29/2013,7:37 AM

## 2013-04-29 NOTE — Clinical Social Work Note (Signed)
Pt d/c today to [facility]. Facility and pt notified. Pt's family also present and plan to transport pt. D/C summary and FL2 faxed.  Derenda Fennel, [place] [phone]

## 2013-05-22 ENCOUNTER — Encounter: Payer: BC Managed Care – PPO | Admitting: Physician Assistant

## 2013-06-11 NOTE — Progress Notes (Signed)
HPI: [name] is a 62 year old patient of Dr. Juanito Doom we are following for ongoing assessment and management of chronic atrial fibrillation, right-sided ventricular dysfunction, with history of morbid obesity, and hypertension. She was last seen by Dr. Daleen Squibb in Feb 2014. Unfortunately the patient a lengthy hospitalization in June of 2014 in the setting of CHF exacerbation, and uncontrolled diabetes. The patient had ventilator dependent respiratory failure, and hemorrhagic shock. The patient had a central line placed which had significant bleeding from the site requiring packed red blood cells, 5 units, along with FFP to be delivered during hospitalization. She had transient atrial fibrillation with RVR. She developed a large groin hematoma with pseudoaneurysm. The patient was given IV Lasix and transition to by mouth. She spent a total of 10 days in the hospital.    Echocardiogram completed during hospitalization and felt systolic function was vigorous with an EF of 65-70%. Ventricular cavity was severely dilated and systolic function was low normal. There was hypokinesis to akinesis in the distal free wall. The right atrium was moderately dilated. There was some moderate TR. She was discharged on Lasix 20 mg daily, metoprolol 100 mg daily along other medications for chronic illness. Coumadin was discontinued.   She comes today with her sister and is feeling well with the exception of right groin pain and some trouble walking. She is being seen by [facility] for dressing changes to right groin and wound care management. She continues to be deconditioned, but has progressed from wheelchair to cane and is now living at home. Denies chest pain, DOE, or racing heart rate.      Allergies  Allergen Reactions  . Penicillins Rash    Current Outpatient Prescriptions  Medication Sig Dispense Refill  . ferrous fumarate (HEMOCYTE - 106 MG FE) 325 (106 FE) MG TABS tablet Take 1 tablet by mouth.      . furosemide  (LASIX) 20 MG tablet Take 2 tablets (40 mg total) by mouth daily.  30 tablet  1  . glimepiride (AMARYL) 2 MG tablet Take 1 mg by mouth daily before breakfast.       . insulin glargine (LANTUS) 100 UNIT/ML injection Inject 30 Units into the skin at bedtime.       Marland Kitchen levothyroxine (SYNTHROID, LEVOTHROID) 200 MCG tablet Take 200 mcg by mouth daily before breakfast.      . metoprolol (LOPRESSOR) 100 MG tablet Take 100 mg by mouth daily.      . mupirocin ointment (BACTROBAN) 2 %       . vitamin C (ASCORBIC ACID) 500 MG tablet Take 500 mg by mouth daily.       No current facility-administered medications for this visit.    Past Medical History  Diagnosis Date  . Hypertension   . Type 2 diabetes mellitus     Type II  . Atrial fibrillation   . Nephrolithiasis   . Overweight(278.02)   . UTI (urinary tract infection)   . Hypothyroidism (acquired)     Past Surgical History  Procedure Laterality Date  . Thyroidectomy    . Groin dissection Right 04/20/2013    Procedure: GROIN EXPLORATION;  Surgeon: Fabio Bering, MD;  Location: [facility];  Service: General;  Laterality: Right;    ZOX:WRUEAV of systems complete and found to be negative unless listed above  PHYSICAL EXAM BP 149/103  Pulse 83  Ht 5' (1.524 m)  Wt 187 lb (84.823 kg)  BMI 36.52 kg/m2  General: Well developed, well nourished, morbidly  obese, in no acute distress Head: Eyes PERRLA, No xanthomas.   Normal cephalic and atramatic  Lungs: Clear bilaterally to auscultation and percussion. Heart: HRIR S1 S2, without MRG.  Pulses are 2+ & equal.            No carotid bruit. No JVD.   Abdomen: Bowel sounds are positive, abdomen soft and non-tender without masses or                  Hernia's noted. Msk:  Back normal, slow gait, with cane for ambulation.. Normal strength and tone for age. Extremities: No clubbing, cyanosis non-pitting edema with venous stasis skin changes, tight.   DP +1 Neuro: Alert and oriented X 3. Psych:  Good  affect, responds appropriately  EKG: Atrial fibrillation. Rate of 84 bpm. Inferior/ lateral T wave flattening.  ASSESSMENT AND PLAN

## 2013-06-12 ENCOUNTER — Ambulatory Visit (INDEPENDENT_AMBULATORY_CARE_PROVIDER_SITE_OTHER): Payer: BC Managed Care – PPO | Admitting: Adult Health

## 2013-06-12 ENCOUNTER — Encounter: Payer: Self-pay | Admitting: Adult Health

## 2013-06-12 VITALS — BP 149/103 | HR 83 | Ht 60.0 in | Wt 187.0 lb

## 2013-06-12 DIAGNOSIS — Z5189 Encounter for other specified aftercare: Secondary | ICD-10-CM

## 2013-06-12 DIAGNOSIS — I5031 Acute diastolic (congestive) heart failure: Secondary | ICD-10-CM

## 2013-06-12 DIAGNOSIS — I517 Cardiomegaly: Secondary | ICD-10-CM

## 2013-06-12 DIAGNOSIS — N179 Acute kidney failure, unspecified: Secondary | ICD-10-CM

## 2013-06-12 DIAGNOSIS — I10 Essential (primary) hypertension: Secondary | ICD-10-CM

## 2013-06-12 DIAGNOSIS — S301XXD Contusion of abdominal wall, subsequent encounter: Secondary | ICD-10-CM

## 2013-06-12 DIAGNOSIS — I4891 Unspecified atrial fibrillation: Secondary | ICD-10-CM

## 2013-06-12 NOTE — Assessment & Plan Note (Signed)
Dressing partially removed and wound found to be healthy. No bleeding or signs of infection. Lower right leg is sore to touch and with weight bearing. Continue to follow.

## 2013-06-12 NOTE — Progress Notes (Signed)
Name: Amber Armstrong    DOB: 11-15-1950  Age: 62 y.o.  MR#: [id number]       PCP:  Pershing Proud      Insurance: Payor: BLUE CROSS BLUE SHIELD / Plan: BCBS PPO OUT OF STATE / Product Type: *No Product type* /   CC:    Chief Complaint  Patient presents with  . Atrial Fibrillation  . Hypertension    VS Filed Vitals:   06/12/13 1135  BP: 149/103  Pulse: 83  Height: 5' (1.524 m)  Weight: 187 lb (84.823 kg)    Weights Current Weight  06/12/13 187 lb (84.823 kg)  04/29/13 200 lb 3.2 oz (90.81 kg)  04/29/13 200 lb 3.2 oz (90.81 kg)    Blood Pressure  BP Readings from Last 3 Encounters:  06/12/13 149/103  04/29/13 106/63  04/29/13 106/63     Admit date:  (Not on file) Last encounter with RMR:  Visit date not found   Allergy Penicillins  Current Outpatient Prescriptions  Medication Sig Dispense Refill  . ferrous fumarate (HEMOCYTE - 106 MG FE) 325 (106 FE) MG TABS tablet Take 1 tablet by mouth.      . furosemide (LASIX) 20 MG tablet Take 2 tablets (40 mg total) by mouth daily.  30 tablet  1  . glimepiride (AMARYL) 2 MG tablet Take 1 mg by mouth daily before breakfast.       . insulin glargine (LANTUS) 100 UNIT/ML injection Inject 30 Units into the skin at bedtime.       Marland Kitchen levothyroxine (SYNTHROID, LEVOTHROID) 200 MCG tablet Take 200 mcg by mouth daily before breakfast.      . metoprolol (LOPRESSOR) 100 MG tablet Take 100 mg by mouth daily.      . mupirocin ointment (BACTROBAN) 2 %       . vitamin C (ASCORBIC ACID) 500 MG tablet Take 500 mg by mouth daily.       No current facility-administered medications for this visit.    Discontinued Meds:    Medications Discontinued During This Encounter  Medication Reason  . bimatoprost (LUMIGAN) 0.03 % ophthalmic solution Error  . diltiazem (TIAZAC) 360 MG 24 hr capsule Error  . HYDROcodone-acetaminophen (NORCO/VICODIN) 5-325 MG per tablet Error  . metFORMIN (GLUCOPHAGE) 500 MG tablet Error  . metoprolol (LOPRESSOR) 100  MG tablet     Patient Active Problem List   Diagnosis Date Noted  . Hematoma of groin 04/20/2013  . Pseudoaneurysm 04/20/2013  . Acute blood loss anemia 04/20/2013  . Hemorrhagic shock 04/20/2013  . Acute diastolic heart failure 04/15/2013  . CHF exacerbation 04/14/2013  . Acute respiratory failure 04/14/2013  . Acute renal failure 04/14/2013  . Right ventricular hypertrophy 12/12/2012  . Bilateral lower extremity edema 03/28/2012  . Long term current use of anticoagulant 02/01/2011  . HYPERLIPIDEMIA 06/14/2010  . HYPOKALEMIA, MILD 04/02/2010  . DIABETES MELLITUS, TYPE II 05/05/2009  . Morbid obesity with BMI of 40.0-44.9, adult 05/05/2009  . HYPERTENSION 05/05/2009  . FIBRILLATION, ATRIAL 05/05/2009  . NEPHROLITHIASIS 05/05/2009  . UTI 05/05/2009  . THYROIDECTOMY, HX OF 05/05/2009    LABS    Component Value Date/Time   NA 143 04/29/2013 0525   NA 139 04/28/2013 0541   NA 141 04/27/2013 0804   K 3.8 04/29/2013 0525   K 3.4* 04/28/2013 0541   K 3.4* 04/27/2013 0804   CL 95* 04/29/2013 0525   CL 93* 04/28/2013 0541   CL 94* 04/27/2013 0804   CO2 41* 04/29/2013  0525   CO2 42* 04/28/2013 0541   CO2 42* 04/27/2013 0804   GLUCOSE 149* 04/29/2013 0525   GLUCOSE 167* 04/28/2013 0541   GLUCOSE 155* 04/27/2013 0804   BUN 31* 04/29/2013 0525   BUN 30* 04/28/2013 0541   BUN 28* 04/27/2013 0804   CREATININE 1.53* 04/29/2013 0525   CREATININE 1.48* 04/28/2013 0541   CREATININE 1.40* 04/27/2013 0804   CREATININE 1.09 04/11/2012 0953   CALCIUM 8.8 04/29/2013 0525   CALCIUM 8.7 04/28/2013 0541   CALCIUM 8.6 04/27/2013 0804   GFRNONAA 35* 04/29/2013 0525   GFRNONAA 37* 04/28/2013 0541   GFRNONAA 39* 04/27/2013 0804   GFRAA 41* 04/29/2013 0525   GFRAA 42* 04/28/2013 0541   GFRAA 45* 04/27/2013 0804   CMP     Component Value Date/Time   NA 143 04/29/2013 0525   K 3.8 04/29/2013 0525   CL 95* 04/29/2013 0525   CO2 41* 04/29/2013 0525   GLUCOSE 149* 04/29/2013 0525   BUN 31* 04/29/2013 0525   CREATININE 1.53* 04/29/2013 0525   CREATININE  1.09 04/11/2012 0953   CALCIUM 8.8 04/29/2013 0525   PROT 5.3* 04/23/2013 0451   ALBUMIN 2.3* 04/23/2013 0451   AST 18 04/23/2013 0451   ALT 10 04/23/2013 0451   ALKPHOS 67 04/23/2013 0451   BILITOT 1.2 04/23/2013 0451   GFRNONAA 35* 04/29/2013 0525   GFRAA 41* 04/29/2013 0525       Component Value Date/Time   WBC 9.3 04/29/2013 0525   WBC 9.8 04/28/2013 0541   WBC 10.2 04/27/2013 0804   HGB 8.3* 04/29/2013 0525   HGB 8.0* 04/28/2013 0541   HGB 8.4* 04/27/2013 0804   HCT 27.1* 04/29/2013 0525   HCT 26.4* 04/28/2013 0541   HCT 26.6* 04/27/2013 0804   MCV 95.1 04/29/2013 0525   MCV 94.6 04/28/2013 0541   MCV 93.7 04/27/2013 0804    Lipid Panel     Component Value Date/Time   CHOL 212* 07/26/2010 2028   TRIG 96 04/23/2013 0451   HDL 43 07/26/2010 2028   CHOLHDL 4.9 Ratio 07/26/2010 2028   VLDL 33 07/26/2010 2028   LDLCALC 136* 07/26/2010 2028    ABG    Component Value Date/Time   PHART 7.462* 04/24/2013 0855   PCO2ART 58.6* 04/24/2013 0855   PO2ART 77.7* 04/24/2013 0855   HCO3 41.3* 04/24/2013 0855   TCO2 37.0 04/24/2013 0855   O2SAT 95.8 04/24/2013 0855     Lab Results  Component Value Date   TSH 3.470 04/14/2013   BNP (last 3 results)  Recent Labs  04/14/13 1030 04/26/13 0500  PROBNP 12159.0* 7558.0*   Cardiac Panel (last 3 results) No results found for this basename: CKTOTAL, CKMB, TROPONINI, RELINDX,  in the last 72 hours  Iron/TIBC/Ferritin No results found for this basename: iron, tibc, ferritin     EKG Orders placed in visit on 06/12/13  . EKG 12-LEAD     Prior Assessment and Plan Problem List as of 06/12/2013     Cardiovascular and Mediastinum   HYPERTENSION   Last Assessment & Plan   12/12/2012 Office Visit Written 12/12/2012  3:17 PM by Gaylord Shih, MD     Good control.    FIBRILLATION, ATRIAL   Last Assessment & Plan   12/12/2012 Office Visit Written 12/12/2012  3:17 PM by Gaylord Shih, MD     Asymptomatic. Continue rate control and anticoagulation. Return the office in one year.     Right ventricular hypertrophy   CHF  exacerbation   Acute diastolic heart failure   Pseudoaneurysm   Hemorrhagic shock     Respiratory   Acute respiratory failure     Endocrine   DIABETES MELLITUS, TYPE II     Genitourinary   NEPHROLITHIASIS   UTI   Acute renal failure     Other   HYPERLIPIDEMIA   Last Assessment & Plan   12/12/2012 Office Visit Edited 12/12/2012  3:20 PM by Gaylord Shih, MD     She says her lipids have been good. I do not have any recent values. With her diabetes, she probably should. Followup with primary care with blood work before starting drug. Goal LDL certainly less than 100.    HYPOKALEMIA, MILD   Morbid obesity with BMI of 40.0-44.9, adult   Last Assessment & Plan   03/28/2012 Office Visit Written 03/28/2012  3:44 PM by Gaylord Shih, MD     I strongly encouraged her to lose weight.     THYROIDECTOMY, HX OF   Long term current use of anticoagulant   Bilateral lower extremity edema   Last Assessment & Plan   12/12/2012 Office Visit Written 12/12/2012  3:17 PM by Gaylord Shih, MD     Improved. No change in current medications.    Hematoma of groin   Acute blood loss anemia       Imaging: No results found.

## 2013-06-12 NOTE — Assessment & Plan Note (Signed)
Heart rate is well controlled currently. She is without cardiac complaint. Her sister is very attentive and helps her with her medications. She is doing well. She is not currently on coumadin due to extensive right groin bleed in the setting of central line placement requiring multiple transfusions.  Will not place her on this now as she continues to have a wound with ongoing wound care.

## 2013-06-12 NOTE — Patient Instructions (Addendum)
Your physician recommends that you schedule a follow-up appointment in: 1 month  Your physician recommends that you continue on your current medications as directed. Please refer to the Current Medication list given to you today.    Your physician recommends that you return for lab work in: today. CMET , CBC, Pro-BNP, A1C

## 2013-06-12 NOTE — Assessment & Plan Note (Signed)
CMET ordered 

## 2013-06-12 NOTE — Assessment & Plan Note (Signed)
She does not appear to be fluid overloaded, but continues to have some non-pitting edema. She remains essentially sedentary but will walk a few times a day with cane to increase exercise tolerance. Will have follow up labs completed to include CMET, CBC, Pro-BNP and Hgb A1C.  She will follow up in one month.

## 2013-06-14 LAB — COMPREHENSIVE METABOLIC PANEL
ALT: 13 U/L (ref 0–35)
Albumin: 3.5 g/dL (ref 3.5–5.2)
Alkaline Phosphatase: 145 U/L — ABNORMAL HIGH (ref 39–117)
Glucose, Bld: 109 mg/dL — ABNORMAL HIGH (ref 70–99)
Potassium: 4 mEq/L (ref 3.5–5.3)
Sodium: 143 mEq/L (ref 135–145)
Total Protein: 6.4 g/dL (ref 6.0–8.3)

## 2013-06-14 LAB — PRO B NATRIURETIC PEPTIDE: Pro B Natriuretic peptide (BNP): 24089 pg/mL — ABNORMAL HIGH (ref <126)

## 2013-06-14 LAB — CBC
MCH: 28.2 pg (ref 26.0–34.0)
MCHC: 30.8 g/dL (ref 30.0–36.0)
MCV: 91.7 fL (ref 78.0–100.0)
Platelets: 268 10*3/uL (ref 150–400)
RBC: 4.32 MIL/uL (ref 3.87–5.11)
RDW: 19.6 % — ABNORMAL HIGH (ref 11.5–15.5)

## 2013-06-17 MED ORDER — FUROSEMIDE 40 MG PO TABS: 40.0000 mg | ORAL_TABLET | Freq: Two times a day (BID) | ORAL | Status: DC

## 2013-06-17 MED ORDER — SPIRONOLACTONE 25 MG PO TABS: 12.5000 mg | ORAL_TABLET | Freq: Every day | ORAL | Status: DC

## 2013-06-17 NOTE — Addendum Note (Signed)
Addended by: Thompson Grayer on: 06/17/2013 01:51 PM   Modules accepted: Orders

## 2013-06-27 ENCOUNTER — Encounter: Payer: Self-pay | Admitting: Adult Health

## 2013-07-01 ENCOUNTER — Inpatient Hospital Stay (HOSPITAL_COMMUNITY)
Admission: EM | Admit: 2013-07-01 | Discharge: 2013-07-04 | DRG: 543 | Disposition: A | Discharge status: Home Health | Payer: BC Managed Care – PPO | Attending: Internal Medicine | Admitting: Internal Medicine

## 2013-07-01 ENCOUNTER — Encounter (HOSPITAL_COMMUNITY): Payer: Self-pay | Admitting: *Deleted

## 2013-07-01 DIAGNOSIS — L02219 Cutaneous abscess of trunk, unspecified: Secondary | ICD-10-CM | POA: Diagnosis present

## 2013-07-01 DIAGNOSIS — N179 Acute kidney failure, unspecified: Secondary | ICD-10-CM

## 2013-07-01 DIAGNOSIS — L089 Local infection of the skin and subcutaneous tissue, unspecified: Secondary | ICD-10-CM

## 2013-07-01 DIAGNOSIS — I10 Essential (primary) hypertension: Secondary | ICD-10-CM | POA: Diagnosis present

## 2013-07-01 DIAGNOSIS — I5032 Chronic diastolic (congestive) heart failure: Secondary | ICD-10-CM | POA: Diagnosis present

## 2013-07-01 DIAGNOSIS — I509 Heart failure, unspecified: Secondary | ICD-10-CM | POA: Diagnosis present

## 2013-07-01 DIAGNOSIS — E669 Obesity, unspecified: Secondary | ICD-10-CM | POA: Diagnosis present

## 2013-07-01 DIAGNOSIS — L03314 Cellulitis of groin: Secondary | ICD-10-CM

## 2013-07-01 DIAGNOSIS — I4891 Unspecified atrial fibrillation: Secondary | ICD-10-CM | POA: Diagnosis present

## 2013-07-01 DIAGNOSIS — E119 Type 2 diabetes mellitus without complications: Secondary | ICD-10-CM | POA: Diagnosis present

## 2013-07-01 DIAGNOSIS — E785 Hyperlipidemia, unspecified: Secondary | ICD-10-CM | POA: Diagnosis present

## 2013-07-01 DIAGNOSIS — I482 Chronic atrial fibrillation, unspecified: Secondary | ICD-10-CM | POA: Diagnosis present

## 2013-07-01 DIAGNOSIS — Y849 Medical procedure, unspecified as the cause of abnormal reaction of the patient, or of later complication, without mention of misadventure at the time of the procedure: Secondary | ICD-10-CM | POA: Diagnosis present

## 2013-07-01 DIAGNOSIS — T798XXA Other early complications of trauma, initial encounter: Secondary | ICD-10-CM

## 2013-07-01 DIAGNOSIS — E1122 Type 2 diabetes mellitus with diabetic chronic kidney disease: Secondary | ICD-10-CM | POA: Diagnosis present

## 2013-07-01 DIAGNOSIS — T8149XA Infection following a procedure, other surgical site, initial encounter: Secondary | ICD-10-CM

## 2013-07-01 DIAGNOSIS — E039 Hypothyroidism, unspecified: Secondary | ICD-10-CM | POA: Diagnosis present

## 2013-07-01 DIAGNOSIS — T80218A Other infection due to central venous catheter, initial encounter: Principal | ICD-10-CM | POA: Diagnosis present

## 2013-07-01 LAB — CBC
HCT: 42.3 % (ref 36.0–46.0)
MCH: 28.6 pg (ref 26.0–34.0)
MCHC: 30.5 g/dL (ref 30.0–36.0)
MCV: 93.8 fL (ref 78.0–100.0)
Platelets: 233 10*3/uL (ref 150–400)
RDW: 17.3 % — ABNORMAL HIGH (ref 11.5–15.5)
WBC: 10.3 10*3/uL (ref 4.0–10.5)

## 2013-07-01 LAB — BASIC METABOLIC PANEL
BUN: 44 mg/dL — ABNORMAL HIGH (ref 6–23)
Calcium: 9.3 mg/dL (ref 8.4–10.5)
GFR calc Af Amer: 45 mL/min — ABNORMAL LOW (ref >90)
GFR calc non Af Amer: 39 mL/min — ABNORMAL LOW (ref >90)
Glucose, Bld: 120 mg/dL — ABNORMAL HIGH (ref 70–99)
Sodium: 142 mEq/L (ref 135–145)

## 2013-07-01 LAB — URINALYSIS, ROUTINE W REFLEX MICROSCOPIC
Leukocytes, UA: NEGATIVE
Nitrite: NEGATIVE
Specific Gravity, Urine: 1.02 (ref 1.005–1.030)
Urobilinogen, UA: 1 mg/dL (ref 0.0–1.0)

## 2013-07-01 LAB — GLUCOSE, CAPILLARY: Glucose-Capillary: 88 mg/dL (ref 70–99)

## 2013-07-01 LAB — APTT: aPTT: 35 seconds (ref 24–37)

## 2013-07-01 MED ORDER — SODIUM CHLORIDE 0.9 % IV SOLN
500.0000 mg | Freq: Two times a day (BID) | INTRAVENOUS | Status: DC
  Administered 2013-07-01: 500 mg via INTRAVENOUS
  Filled 2013-07-01 (×2): qty 500

## 2013-07-01 MED ORDER — POLYETHYLENE GLYCOL 3350 17 G PO PACK: 17.0000 g | PACK | Freq: Every day | ORAL | Status: DC | PRN

## 2013-07-01 MED ORDER — SPIRONOLACTONE 25 MG PO TABS
12.5000 mg | ORAL_TABLET | Freq: Every day | ORAL | Status: DC
  Administered 2013-07-02 – 2013-07-04 (×3): 12.5 mg via ORAL
  Filled 2013-07-01 (×3): qty 1

## 2013-07-01 MED ORDER — ONDANSETRON HCL 4 MG/2ML IJ SOLN: 4.0000 mg | INTRAMUSCULAR | Status: DC | PRN

## 2013-07-01 MED ORDER — GLIMEPIRIDE 2 MG PO TABS
1.0000 mg | ORAL_TABLET | Freq: Every day | ORAL | Status: DC
  Administered 2013-07-02 – 2013-07-04 (×3): 1 mg via ORAL
  Filled 2013-07-01 (×3): qty 1

## 2013-07-01 MED ORDER — INSULIN ASPART 100 UNIT/ML ~~LOC~~ SOLN: 0.0000 [IU] | Freq: Every day | SUBCUTANEOUS | Status: DC

## 2013-07-01 MED ORDER — SODIUM CHLORIDE 0.9 % IJ SOLN: 3.0000 mL | INTRAMUSCULAR | Status: DC | PRN

## 2013-07-01 MED ORDER — SODIUM CHLORIDE 0.9 % IV SOLN
INTRAVENOUS | Status: AC
  Filled 2013-07-01: qty 500

## 2013-07-01 MED ORDER — HEPARIN SODIUM (PORCINE) 5000 UNIT/ML IJ SOLN
5000.0000 [IU] | Freq: Three times a day (TID) | INTRAMUSCULAR | Status: DC
  Administered 2013-07-01: 5000 [IU] via SUBCUTANEOUS
  Filled 2013-07-01 (×2): qty 1

## 2013-07-01 MED ORDER — ACETAMINOPHEN 500 MG PO TABS
500.0000 mg | ORAL_TABLET | Freq: Four times a day (QID) | ORAL | Status: DC | PRN
  Administered 2013-07-01 – 2013-07-03 (×2): 500 mg via ORAL
  Filled 2013-07-01 (×2): qty 1

## 2013-07-01 MED ORDER — FLEET ENEMA 7-19 GM/118ML RE ENEM: 1.0000 | ENEMA | Freq: Once | RECTAL | Status: AC | PRN

## 2013-07-01 MED ORDER — SODIUM CHLORIDE 0.9 % IV SOLN: 250.0000 mL | INTRAVENOUS | Status: DC | PRN

## 2013-07-01 MED ORDER — SODIUM CHLORIDE 0.9 % IJ SOLN
3.0000 mL | Freq: Two times a day (BID) | INTRAMUSCULAR | Status: DC
  Administered 2013-07-02 – 2013-07-03 (×3): 3 mL via INTRAVENOUS

## 2013-07-01 MED ORDER — LEVOTHYROXINE SODIUM 100 MCG PO TABS
200.0000 ug | ORAL_TABLET | Freq: Every day | ORAL | Status: DC
  Administered 2013-07-02 – 2013-07-04 (×3): 200 ug via ORAL
  Filled 2013-07-01 (×3): qty 2

## 2013-07-01 MED ORDER — INSULIN ASPART 100 UNIT/ML ~~LOC~~ SOLN: 0.0000 [IU] | Freq: Three times a day (TID) | SUBCUTANEOUS | Status: DC

## 2013-07-01 MED ORDER — BISACODYL 5 MG PO TBEC: 5.0000 mg | DELAYED_RELEASE_TABLET | Freq: Every day | ORAL | Status: DC | PRN

## 2013-07-01 MED ORDER — RISAQUAD PO CAPS
2.0000 | ORAL_CAPSULE | Freq: Every day | ORAL | Status: DC
  Administered 2013-07-02 – 2013-07-04 (×3): 2 via ORAL
  Filled 2013-07-01 (×3): qty 2

## 2013-07-01 MED ORDER — METOPROLOL TARTRATE 50 MG PO TABS
100.0000 mg | ORAL_TABLET | Freq: Every day | ORAL | Status: DC
  Administered 2013-07-02 – 2013-07-03 (×2): 100 mg via ORAL
  Filled 2013-07-01 (×2): qty 2

## 2013-07-01 MED ORDER — FUROSEMIDE 40 MG PO TABS
40.0000 mg | ORAL_TABLET | Freq: Two times a day (BID) | ORAL | Status: DC
  Administered 2013-07-02 – 2013-07-04 (×4): 40 mg via ORAL
  Filled 2013-07-01 (×4): qty 1

## 2013-07-01 MED ORDER — VANCOMYCIN HCL 10 G IV SOLR
1500.0000 mg | Freq: Once | INTRAVENOUS | Status: AC
  Administered 2013-07-01: 1500 mg via INTRAVENOUS
  Filled 2013-07-01: qty 1500

## 2013-07-01 MED ORDER — VANCOMYCIN HCL 10 G IV SOLR
1500.0000 mg | INTRAVENOUS | Status: DC
  Administered 2013-07-02 – 2013-07-03 (×2): 1500 mg via INTRAVENOUS
  Filled 2013-07-01 (×3): qty 1500

## 2013-07-01 NOTE — ED Provider Notes (Signed)
CSN: [id number]     Arrival date & time 07/01/13  1458 History  This chart was scribed for Dagmar Hait, MD by Valera Castle, ED scribe. This patient was seen in room APA05/APA05 and the patient's care was started at 3:17 PM.    Chief Complaint  Patient presents with  . Wound Check    Patient is a 62 y.o. female presenting with wound check. The history is provided by the patient. No language interpreter was used.  Wound Check This is a new problem. The current episode started 2 days ago. The problem occurs constantly. The problem has been rapidly worsening. Pertinent negatives include no chest pain and no abdominal pain. Nothing aggravates the symptoms.   HPI Comments: AVIANA SHEVLIN is a 62 y.o. female who presents to the Emergency Department complaining of an infected wound to her right leg from surgery that she reports is tunneling with redness, drainage, and swelling. A relative states pt has had a central line as well as a pick line, and that is what she thinks has gotten infected. She states the tunneling started 2 days ago, with new onset redness. She has been on antibiotics, her last dose was 2 weeks ago. She states that she lost 50 lbs during her last admission. She denies fever, nausea, emesis, numbness, diarrhea, and any other associated symptoms.    Dr. Caesar Bookman is her PCP.    Past Medical History  Diagnosis Date  . Hypertension   . Type 2 diabetes mellitus     Type II  . Atrial fibrillation   . Nephrolithiasis   . Overweight(278.02)   . UTI (urinary tract infection)   . Hypothyroidism (acquired)    Past Surgical History  Procedure Laterality Date  . Thyroidectomy    . Groin dissection Right 04/20/2013    Procedure: GROIN EXPLORATION;  Surgeon: Fabio Bering, MD;  Location: [facility];  Service: General;  Laterality: Right;   Family History  Problem Relation Age of Onset  . Arrhythmia Mother     Atrial fib   History  Substance Use Topics  . Smoking status:  Never Smoker   . Smokeless tobacco: Never Used  . Alcohol Use: No   OB History   Grav Para Term Preterm Abortions TAB SAB Ect Mult Living                 Review of Systems  Constitutional: Positive for unexpected weight change (50 lb weight loss during previous admission.). Negative for fever.  Cardiovascular: Negative for chest pain.  Gastrointestinal: Negative for nausea, vomiting, abdominal pain and diarrhea.  Skin: Positive for wound (Infected.).  Neurological: Negative for numbness.  All other systems reviewed and are negative.    Allergies  Penicillins  Home Medications   Current Outpatient Rx  Name  Route  Sig  Dispense  Refill  . ferrous fumarate (HEMOCYTE - 106 MG FE) 325 (106 FE) MG TABS tablet   Oral   Take 1 tablet by mouth.         . furosemide (LASIX) 40 MG tablet   Oral   Take 1 tablet (40 mg total) by mouth 2 (two) times daily.   30 tablet   1   . glimepiride (AMARYL) 2 MG tablet   Oral   Take 1 mg by mouth daily before breakfast.          . insulin glargine (LANTUS) 100 UNIT/ML injection   Subcutaneous   Inject 30 Units into the skin  at bedtime.          Marland Kitchen levothyroxine (SYNTHROID, LEVOTHROID) 200 MCG tablet   Oral   Take 200 mcg by mouth daily before breakfast.         . metoprolol (LOPRESSOR) 100 MG tablet   Oral   Take 100 mg by mouth daily.         . mupirocin ointment (BACTROBAN) 2 %               . spironolactone (ALDACTONE) 25 MG tablet   Oral   Take 0.5 tablets (12.5 mg total) by mouth daily.   30 tablet   3   . vitamin C (ASCORBIC ACID) 500 MG tablet   Oral   Take 500 mg by mouth daily.          Triage Vitals: BP 138/97  Pulse 86  Temp(Src) 98.4 F (36.9 C) (Oral)  Resp 20  Ht 5' (1.524 m)  Wt 178 lb (80.74 kg)  BMI 34.76 kg/m2  SpO2 95%  Physical Exam  Nursing note and vitals reviewed. Constitutional: She is oriented to person, place, and time. She appears well-developed and well-nourished. No  distress.  HENT:  Head: Normocephalic and atraumatic.  Eyes: EOM are normal.  Neck: Neck supple. No tracheal deviation present.  Cardiovascular: Normal rate and regular rhythm.   Pulmonary/Chest: Effort normal and breath sounds normal. No respiratory distress.  Abdominal: Soft. There is no tenderness.  Genitourinary:  4x3cm healing wound with beefy red granulation tissue in right groin. Roughly 2 cm below she has 2 cm hole with purulent drainage with surrounding cellulitis radiating distally and laterally.  Musculoskeletal: Normal range of motion.  Neurological: She is alert and oriented to person, place, and time.  Skin: Skin is warm and dry.  Psychiatric: She has a normal mood and affect. Her behavior is normal.    ED Course  Procedures (including critical care time)  DIAGNOSTIC STUDIES: Oxygen Saturation is 95% on room air, adequate by my interpretation.    COORDINATION OF CARE: 3:20 PM-Discussed treatment plan which includes advising pt not to eat anything with pt at bedside and pt agreed to plan. Will consult surgeons.      Labs Review Labs Reviewed  CBC - Abnormal; Notable for the following:    RDW 17.3 (*)    All other components within normal limits  BASIC METABOLIC PANEL - Abnormal; Notable for the following:    Glucose, Bld 120 (*)    BUN 44 (*)    Creatinine, Ser 1.40 (*)    GFR calc non Af Amer 39 (*)    GFR calc Af Amer 45 (*)    All other components within normal limits  WOUND CULTURE   Imaging Review No results found.  MDM   1. Wound infection, initial encounter    72F with hx of chronic R groin wound. 2 days ago another wound opened up distal to that. Associated surrounding cellulitis and erythema. Denies systemic symptoms.  AFVSS here. R groin with large ulcer, beefy red, well-healing. Distal to that, small 2 cm deep wound with copious purulent drainage. Marked surrounding cellulitis and erythema. Labs with leukocytosis. Surgery will see tomorrow in  the morning. Medicine admitting.     I personally performed the services described in this documentation, which was scribed in my presence. The recorded information has been reviewed and is accurate.     Dagmar Hait, MD 07/01/13 2104

## 2013-07-01 NOTE — ED Notes (Signed)
Notified AC of med order from pharmacy.

## 2013-07-01 NOTE — H&P (Signed)
[facility] History and Physical  Amber Armstrong  ZOX:[id number]  DOB: [birth year]   DOA: 07/01/2013   PCP:   Pers[facility] Proud   Chief Complaint:  Purulent discharge from right thigh wound x today HPI: Amber Armstrong is a 62 y.o. female.   Pleasant obese Caucasian lady required a femoral central line during hospitalization 2 months ago, and suffered complications of that procedure including pseudoaneurysm formation and bleeding that required emergency exploration. The patient has reportedly had delayed healing of the complicated by infection and has been getting dressings at home by home health and weekly, and by her sister daily. She is also been visiting her surgeon Dr. Leticia Penna.  Patient reports that she cannot see the wound herself, but her sister is informed that a lump formed 2 days ago and then broke open and there was a yellow discharge. She has had no fever, there is no foul odor. She maintains that she is still able to ambulate with the assistance of a cane.    Rewiew of Systems:  Patient denies any problems in the review of systems  All systems negative except as marked bold or noted in the HPI;  Constitutional:    malaise, fever and chills. ;  Eyes:   eye pain, redness and discharge. ;  ENMT:   ear pain, hoarseness, nasal congestion, sinus pressure and sore throat. ;  Cardiovascular:    chest pain, palpitations, diaphoresis, dyspnea and peripheral edema.  Respiratory:   cough, hemoptysis, wheezing and stridor. ;  Gastrointestinal:  nausea, vomiting, diarrhea, constipation, abdominal pain, melena, blood in stool, hematemesis, jaundice and rectal bleeding. unusual weight loss..   Genitourinary:    frequency, dysuria, incontinence,flank pain and hematuria; Musculoskeletal:   back pain and neck pain.  swelling and trauma.;  Skin: .  pruritus, rash, abrasions, bruising. Neuro:    headache, lightheadedness and neck stiffness.  weakness, altered level of consciousness,  altered mental status, extremity weakness, burning feet, involuntary movement, seizure and syncope.  Psych:    anxiety, depression, insomnia, tearfulness, panic attacks, hallucinations, paranoia, suicidal or homicidal ideation    Past Medical History  Diagnosis Date  . Hypertension   . Type 2 diabetes mellitus     Type II  . Atrial fibrillation   . Nephrolithiasis   . Overweight(278.02)   . UTI (urinary tract infection)   . Hypothyroidism (acquired)   . CHF (congestive heart failure)     Past Surgical History  Procedure Laterality Date  . Thyroidectomy    . Groin dissection Right 04/20/2013    Procedure: GROIN EXPLORATION;  Surgeon: Fabio Bering, MD;  Location: [facility];  Service: General;  Laterality: Right;    Medications:  HOME MEDS: Prior to Admission medications   Medication Sig Start Date End Date Taking? Authorizing Provider  acetaminophen (TYLENOL) 500 MG tablet Take 500 mg by mouth every 6 (six) hours as needed for pain.   Yes Historical Provider, MD  ferrous fumarate (HEMOCYTE - 106 MG FE) 325 (106 FE) MG TABS tablet Take 1 tablet by mouth.   Yes Historical Provider, MD  furosemide (LASIX) 40 MG tablet Take 1 tablet (40 mg total) by mouth 2 (two) times daily. 06/17/13  Yes Jodelle Gross, NP  glimepiride (AMARYL) 2 MG tablet Take 1 mg by mouth daily before breakfast.    Yes Historical Provider, MD  levothyroxine (SYNTHROID, LEVOTHROID) 200 MCG tablet Take 200 mcg by mouth daily before breakfast.   Yes Historical Provider, MD  metoprolol (LOPRESSOR)  100 MG tablet Take 100 mg by mouth daily. 04/29/13  Yes Pamella Pert, MD  spironolactone (ALDACTONE) 25 MG tablet Take 0.5 tablets (12.5 mg total) by mouth daily. 06/17/13  Yes Jodelle Gross, NP  vitamin C (ASCORBIC ACID) 500 MG tablet Take 500 mg by mouth daily.   Yes Historical Provider, MD     Allergies:  Allergies  Allergen Reactions  . Penicillins Rash    Social History:   reports that she has never  smoked. She has never used smokeless tobacco. She reports that she does not drink alcohol or use illicit drugs.  Family History: Family History  Problem Relation Age of Onset  . Arrhythmia Mother     Atrial fib     Physical Exam: Filed Vitals:   07/01/13 1509 07/01/13 2016 07/01/13 2108  BP: 138/97 135/83 128/82  Pulse: 86 94 97  Temp: 98.4 F (36.9 C)  98.1 F (36.7 C)  TempSrc: Oral  Oral  Resp: 20 20 20   Height: 5' (1.524 m)  5' (1.524 m)  Weight: 80.74 kg (178 lb)  80.8 kg (178 lb 2.1 oz)  SpO2: 95% 92% 91%   Blood pressure 128/82, pulse 97, temperature 98.1 F (36.7 C), temperature source Oral, resp. rate 20, height 5' (1.524 m), weight 80.8 kg (178 lb 2.1 oz), SpO2 91.00%. Body mass index is 34.79 kg/(m^2).   GEN:  Pleasant obese Caucasian lying bed in no acute distress; cooperative with exam PSYCH:  alert and oriented x4;  neither anxious nor depressed; affect is appropriate. HEENT: Mucous membranes pink and anicteric; PERRLA; EOM intact; no cervical lymphadenopathy nor thyromegaly or carotid bruit; no JVD; Breasts:: Not examined  CHEST WALL: No tenderness CHEST: Normal respiration, clear to auscultation bilaterally HEART: Irregularly irregular rhythm; no murmurs rubs or gallops ABDOMEN: Obese, soft non-tender; no masses, no organomegaly, normal abdominal bowel sounds; large pannus; no intertriginous candida. Rectal Exam: Not done EXTREMITIES: Edema and redness and tenderness of her upper third of the right inner thigh to groin;4x3cm healing wound with beefy red granulation tissue in right groin. Roughly 2 cm below she has 2 cm hole with purulent drainage with surrounding cellulitis  Genitalia: not examined PULSES: 2+ and symmetric SKIN: Normal hydration no rash or ulceration CNS: Cranial nerves 2-12 grossly intact no focal lateralizing neurologic deficit   Labs on Admission:  Basic Metabolic Panel:  Recent Labs Lab 07/01/13 1540  NA 142  K 3.7  CL 99  CO2  31  GLUCOSE 120*  BUN 44*  CREATININE 1.40*  CALCIUM 9.3   Liver Function Tests: No results found for this basename: AST, ALT, ALKPHOS, BILITOT, PROT, ALBUMIN,  in the last 168 hours No results found for this basename: LIPASE, AMYLASE,  in the last 168 hours No results found for this basename: AMMONIA,  in the last 168 hours CBC:  Recent Labs Lab 07/01/13 1540  WBC 10.3  HGB 12.9  HCT 42.3  MCV 93.8  PLT 233   Cardiac Enzymes: No results found for this basename: CKTOTAL, CKMB, CKMBINDEX, TROPONINI,  in the last 168 hours BNP: No components found with this basename: POCBNP,  D-dimer: No components found with this basename: D-DIMER,  CBG: No results found for this basename: GLUCAP,  in the last 168 hours  Radiological Exams on Admission: No results found.   Assessment/Plan  Active Problems:   DIABETES MELLITUS, TYPE II   HYPERLIPIDEMIA   HYPERTENSION   FIBRILLATION, ATRIAL   Wound infection after surgery   Chronic  diastolic heart failure   Cellulitis of right groin  PLAN: We'll admit for intravenous antibiotic therapy; give coverage with imipenem as well as vancomycin Her surgery has already been consulted Continue management of her chronic medical conditions  Other plans as per orders.  Code Status: Full code Family Communication: No family at bedside Disposition Plan: Depending on hospital course   Lafreda Casebeer Nocturnist [facility] Pager [phone]   07/01/2013, 9:19 PM

## 2013-07-01 NOTE — Progress Notes (Signed)
ANTIBIOTIC CONSULT NOTE - INITIAL  Pharmacy Consult for Vancomycin and Primaxin Indication: infected wound  Allergies  Allergen Reactions  . Penicillins Rash    Patient Measurements: Height: 5' (152.4 cm) Weight: 178 lb 2.1 oz (80.8 kg) IBW/kg (Calculated) : 45.5  Vital Signs: Temp: 98.1 F (36.7 C) (09/08 2108) Temp src: Oral (09/08 2108) BP: 128/82 mmHg (09/08 2108) Pulse Rate: 97 (09/08 2108) Intake/Output from previous day:   Intake/Output from this shift:    Labs:  Recent Labs  07/01/13 1540  WBC 10.3  HGB 12.9  PLT 233  CREATININE 1.40*   Estimated Creatinine Clearance: 38.7 ml/min (by C-G formula based on Cr of 1.4). No results found for this basename: VANCOTROUGH, VANCOPEAK, VANCORANDOM, GENTTROUGH, GENTPEAK, GENTRANDOM, TOBRATROUGH, TOBRAPEAK, TOBRARND, AMIKACINPEAK, AMIKACINTROU, AMIKACIN,  in the last 72 hours   Microbiology: No results found for this or any previous visit (from the past 720 hour(s)).  Medical History: Past Medical History  Diagnosis Date  . Hypertension   . Type 2 diabetes mellitus     Type II  . Atrial fibrillation   . Nephrolithiasis   . Overweight(278.02)   . UTI (urinary tract infection)   . Hypothyroidism (acquired)   . CHF (congestive heart failure)     Assessment: 62yo female with infected leg wound.  Pt received Vancomycin 1500mg  on admission.  Estimated Creatinine Clearance: 38.7 ml/min (by C-G formula based on Cr of 1.4).   Goal of Therapy:  Trough level 10-15  Plan:  Vancomycin 1500mg  iv q24h Primaxin 500mg  iv q12h Monitor labs, renal fxn, and cultures.  Valrie Hart A 07/01/2013,10:36 PM

## 2013-07-01 NOTE — ED Notes (Signed)
Attempted to call report. Receiving Night Shift RN getting report and unit secretary reported would call back after report.

## 2013-07-01 NOTE — ED Notes (Signed)
Family reported had to leave and go home for a little while. Pt family reported would come back shortly.Pt family left contact information. [facility], [phone].

## 2013-07-01 NOTE — ED Notes (Signed)
AC dressed pt wounds with moistened gauze and tegaderm. Pt tolerated well.

## 2013-07-01 NOTE — ED Notes (Signed)
Surgery to right leg from wound.  Reports wound is tunneling with redness, drainage, and swelling.

## 2013-07-02 DIAGNOSIS — Z6841 Body Mass Index (BMI) 40.0 and over, adult: Secondary | ICD-10-CM

## 2013-07-02 LAB — GLUCOSE, CAPILLARY: Glucose-Capillary: 79 mg/dL (ref 70–99)

## 2013-07-02 LAB — COMPREHENSIVE METABOLIC PANEL
ALT: 15 U/L (ref 0–35)
AST: 23 U/L (ref 0–37)
Albumin: 3.1 g/dL — ABNORMAL LOW (ref 3.5–5.2)
CO2: 29 mEq/L (ref 19–32)
Chloride: 102 mEq/L (ref 96–112)
GFR calc non Af Amer: 43 mL/min — ABNORMAL LOW (ref >90)
Potassium: 3.6 mEq/L (ref 3.5–5.1)
Sodium: 142 mEq/L (ref 135–145)
Total Bilirubin: 1 mg/dL (ref 0.3–1.2)

## 2013-07-02 LAB — CBC
Platelets: 210 10*3/uL (ref 150–400)
RBC: 4.58 MIL/uL (ref 3.87–5.11)
WBC: 7.9 10*3/uL (ref 4.0–10.5)

## 2013-07-02 LAB — HEMOGLOBIN A1C
Hgb A1c MFr Bld: 5.3 % (ref <5.7)
Mean Plasma Glucose: 105 mg/dL (ref <117)

## 2013-07-02 MED ORDER — SODIUM CHLORIDE 0.9 % IV SOLN
500.0000 mg | Freq: Three times a day (TID) | INTRAVENOUS | Status: DC
  Administered 2013-07-02 – 2013-07-04 (×7): 500 mg via INTRAVENOUS
  Filled 2013-07-02 (×10): qty 500

## 2013-07-02 NOTE — Consult Note (Signed)
Reason for Consult: Right groin wound Referring Physician: [facility]  Amber Armstrong is an 62 y.o. female.  HPI: Patient is a 62 year old Amber Armstrong female who is well known to our surgical service after developing a hematoma in her right thigh secondary to emergency right groin exploration. She was at [facility] for a little while and has since been taking care of the large right groin wound at home. She has been seen in our office. This is has been improving. She presented emergency room yesterday with a new opening was some purulent drainage present. She was admitted by the hospitalist for further evaluation treatment.  Past Medical History  Diagnosis Date  . Hypertension   . Type 2 diabetes mellitus     Type II  . Atrial fibrillation   . Nephrolithiasis   . Overweight(278.02)   . UTI (urinary tract infection)   . Hypothyroidism (acquired)   . CHF (congestive heart failure)     Past Surgical History  Procedure Laterality Date  . Thyroidectomy    . Groin dissection Right 04/20/2013    Procedure: GROIN EXPLORATION;  Surgeon: Fabio Bering, MD;  Location: [facility];  Service: General;  Laterality: Right;    Family History  Problem Relation Age of Onset  . Arrhythmia Mother     Atrial fib    Social History:  reports that she has never smoked. She has never used smokeless tobacco. She reports that she does not drink alcohol or use illicit drugs.  Allergies:  Allergies  Allergen Reactions  . Penicillins Rash    Medications: I have reviewed the patient's current medications.  Results for orders placed during the hospital encounter of 07/01/13 (from the past 48 hour(s))  CBC     Status: Abnormal   Collection Time    07/01/13  3:40 PM      Result Value Range   WBC 10.3  4.0 - 10.5 K/uL   RBC 4.51  3.87 - 5.11 MIL/uL   Hemoglobin 12.9  12.0 - 15.0 g/dL   HCT 16.1  09.6 - 04.5 %   MCV 93.8  78.0 - 100.0 fL   MCH 28.6  26.0 - 34.0 pg   MCHC 30.5  30.0 - 36.0 g/dL   RDW 40.9  (*) 81.1 - 15.5 %   Platelets 233  150 - 400 K/uL  BASIC METABOLIC PANEL     Status: Abnormal   Collection Time    07/01/13  3:40 PM      Result Value Range   Sodium 142  135 - 145 mEq/L   Potassium 3.7  3.5 - 5.1 mEq/L   Chloride 99  96 - 112 mEq/L   CO2 31  19 - 32 mEq/L   Glucose, Bld 120 (*) 70 - 99 mg/dL   BUN 44 (*) 6 - 23 mg/dL   Creatinine, Ser 9.14 (*) 0.50 - 1.10 mg/dL   Calcium 9.3  8.4 - 78.2 mg/dL   GFR calc non Af Amer 39 (*) >90 mL/min   GFR calc Af Amer 45 (*) >90 mL/min   Comment: (NOTE)     The eGFR has been calculated using the CKD EPI equation.     This calculation has not been validated in all clinical situations.     eGFR's persistently <90 mL/min signify possible Chronic Kidney     Disease.  WOUND CULTURE     Status: None   Collection Time    07/01/13  3:40 PM  Result Value Range   Specimen Description WOUND     Special Requests Normal     Gram Stain       Value: NO WBC SEEN     NO SQUAMOUS EPITHELIAL CELLS SEEN     ABUNDANT GRAM POSITIVE COCCI IN PAIRS     Performed at [facility]   Culture       Value: Culture reincubated for better growth     Performed at [facility]   Report Status PENDING    GLUCOSE, CAPILLARY     Status: None   Collection Time    07/01/13  9:00 PM      Result Value Range   Glucose-Capillary 88  70 - 99 mg/dL   Comment 1 Notify RN     Comment 2 Documented in Chart    APTT     Status: None   Collection Time    07/01/13  9:57 PM      Result Value Range   aPTT 35  24 - 37 seconds  PROTIME-INR     Status: None   Collection Time    07/01/13  9:57 PM      Result Value Range   Prothrombin Time 14.3  11.6 - 15.2 seconds   INR 1.13  0.00 - 1.49  URINALYSIS, ROUTINE W REFLEX MICROSCOPIC     Status: Abnormal   Collection Time    07/01/13 10:36 PM      Result Value Range   Color, Urine YELLOW  YELLOW   APPearance CLEAR  CLEAR   Specific Gravity, Urine 1.020  1.005 - 1.030   pH 6.0  5.0 - 8.0    Glucose, UA NEGATIVE  NEGATIVE mg/dL   Hgb urine dipstick TRACE (*) NEGATIVE   Bilirubin Urine NEGATIVE  NEGATIVE   Ketones, ur NEGATIVE  NEGATIVE mg/dL   Protein, ur 161 (*) NEGATIVE mg/dL   Urobilinogen, UA 1.0  0.0 - 1.0 mg/dL   Nitrite NEGATIVE  NEGATIVE   Leukocytes, UA NEGATIVE  NEGATIVE  URINE MICROSCOPIC-ADD ON     Status: Abnormal   Collection Time    07/01/13 10:36 PM      Result Value Range   Squamous Epithelial / LPF FEW (*) RARE   WBC, UA 0-2  <3 WBC/hpf   RBC / HPF 0-2  <3 RBC/hpf   Casts HYALINE CASTS (*) NEGATIVE  CBC     Status: Abnormal   Collection Time    07/02/13  6:12 AM      Result Value Range   WBC 7.9  4.0 - 10.5 K/uL   RBC 4.58  3.87 - 5.11 MIL/uL   Hemoglobin 13.2  12.0 - 15.0 g/dL   HCT 09.6  04.5 - 40.9 %   MCV 94.3  78.0 - 100.0 fL   MCH 28.8  26.0 - 34.0 pg   MCHC 30.6  30.0 - 36.0 g/dL   RDW 81.1 (*) 91.4 - 78.2 %   Platelets 210  150 - 400 K/uL  COMPREHENSIVE METABOLIC PANEL     Status: Abnormal   Collection Time    07/02/13  6:12 AM      Result Value Range   Sodium 142  135 - 145 mEq/L   Potassium 3.6  3.5 - 5.1 mEq/L   Chloride 102  96 - 112 mEq/L   CO2 29  19 - 32 mEq/L   Glucose, Bld 90  70 - 99 mg/dL   BUN 39 (*) 6 -  23 mg/dL   Creatinine, Ser 4.09 (*) 0.50 - 1.10 mg/dL   Calcium 9.2  8.4 - 81.1 mg/dL   Total Protein 7.5  6.0 - 8.3 g/dL   Albumin 3.1 (*) 3.5 - 5.2 g/dL   AST 23  0 - 37 U/L   ALT 15  0 - 35 U/L   Alkaline Phosphatase 160 (*) 39 - 117 U/L   Total Bilirubin 1.0  0.3 - 1.2 mg/dL   GFR calc non Af Amer 43 (*) >90 mL/min   GFR calc Af Amer 50 (*) >90 mL/min   Comment: (NOTE)     The eGFR has been calculated using the CKD EPI equation.     This calculation has not been validated in all clinical situations.     eGFR's persistently <90 mL/min signify possible Chronic Kidney     Disease.  GLUCOSE, CAPILLARY     Status: None   Collection Time    07/02/13  7:35 AM      Result Value Range   Glucose-Capillary 79  70 -  99 mg/dL    No results found.  ROS: See chart Blood pressure 135/100, pulse 81, temperature 97.8 F (36.6 C), temperature source Oral, resp. rate 20, height 5' (1.524 m), weight 81.1 kg (178 lb 12.7 oz), SpO2 98.00%. Physical Exam: Pleasant obese [name] in no acute distress. Extremity examination reveals a granulating right groin wound with a necrotic opening just inferior to this. It seems to be still subcutaneous in nature. Some necrotic purulent fluid is emanating from this. It does tunnel underneath the skin.  Assessment/Plan: Impression: No right groin wound with new necrotic area. Plan: Would continue IV vancomycin. Have written orders for new wound care. Suspect patient will be in the hospital for the next 24-48 hours. She has excellent support at home for addressing this. We'll follow with you.  Sadie Pickar A 07/02/2013, 11:05 AM

## 2013-07-02 NOTE — Care Management Note (Unsigned)
    Page 1 of 1   07/02/2013     3:00:43 PM   CARE MANAGEMENT NOTE 07/02/2013  Patient:  Amber Armstrong, Amber Armstrong   Account Number:  [id number]  Date Initiated:  07/02/2013  Documentation initiated by:  Rosemary Holms  Subjective/Objective Assessment:   Pt admitted from home with a buttock abcess. Currently had [facility] and will return on [facility] RN care.     Action/Plan:   Anticipated DC Date:  07/03/2013   Anticipated DC Plan:  HOME W HOME HEALTH SERVICES      DC Planning Services  CM consult      Choice offered to / List presented to:          [facility] arranged  HH-1 RN  HH-10 DISEASE MANAGEMENT      HH agency  [facility]   Status of service:  In process, will continue to follow Medicare Important Message given?   (If response is "NO", the following Medicare IM given date fields will be blank) Date Medicare IM given:   Date Additional Medicare IM given:    Discharge Disposition:    Per UR Regulation:    If discussed at Long Length of Stay Meetings, dates discussed:    Comments:  07/02/13 Kaeya Schiffer Leanord Hawking RN BSN CM Wendie Agreste from [place], case manager called to advise that pt has HH benefits and previously used [facility].

## 2013-07-02 NOTE — Progress Notes (Signed)
Utilization Review Complete  

## 2013-07-02 NOTE — Progress Notes (Signed)
  Amber Armstrong:[id number] DOB: 1950/10/31 DOA: 07/01/2013 PCP: Pershing Proud   Subjective: This lady says she feels much improved compared to yesterday. She apparently had any opening with some purulent drainage from the right thigh hematoma.           Physical Exam: Blood pressure 135/100, pulse 81, temperature 97.8 F (36.6 C), temperature source Oral, resp. rate 20, height 5' (1.524 m), weight 81.1 kg (178 lb 12.7 oz), SpO2 98.00%. She looks systemically well. She is not toxic or septic. She is hemodynamic stable. Granulating right groin wound with some period of fluid. Heart sounds are present and normal without murmurs. Lung fields are clear. She is alert and oriented.   Investigations:  Recent Results (from the past 240 hour(s))  WOUND CULTURE     Status: None   Collection Time    07/01/13  3:40 PM      Result Value Range Status   Specimen Description WOUND   Final   Special Requests Normal   Final   Gram Stain     Final   Value: NO WBC SEEN     NO SQUAMOUS EPITHELIAL CELLS SEEN     ABUNDANT GRAM POSITIVE COCCI IN PAIRS     Performed at [facility]   Culture     Final   Value: Culture reincubated for better growth     Performed at [facility]   Report Status PENDING   Incomplete     Basic Metabolic Panel:  Recent Labs  81/19/14 1540 07/02/13 0612  NA 142 142  K 3.7 3.6  CL 99 102  CO2 31 29  GLUCOSE 120* 90  BUN 44* 39*  CREATININE 1.40* 1.30*  CALCIUM 9.3 9.2   Liver Function Tests:  Recent Labs  07/02/13 0612  AST 23  ALT 15  ALKPHOS 160*  BILITOT 1.0  PROT 7.5  ALBUMIN 3.1*     CBC:  Recent Labs  07/01/13 1540 07/02/13 0612  WBC 10.3 7.9  HGB 12.9 13.2  HCT 42.3 43.2  MCV 93.8 94.3  PLT 233 210    No results found.    Medications: I have reviewed the patient's current medications.  Impression: 1. Right groin wound with possible new necrotic area. Right groin cellulitis. No surgical  indication at the present time per surgery. On intravenous antibiotics. 2. Hypertension. 3. Type 2 diabetes mellitus.     Plan: 1. Continue with intravenous antibiotics.  Consultants:  Surgery, Dr. Lovell Sheehan.   Procedures:  None.   Antibiotics:  Intravenous Primaxin started 07/02/2013.  Intravenous vancomycin started 07/02/2013.                   Code Status: Full code.  Family Communication: Discussed plan with patient at the bedside.   Disposition Plan: Home when medically stable, possibly tomorrow.  Time spent:  15 minutes.   LOS: 1 day   Wilson Singer Pager [phone]  07/02/2013, 12:43 PM

## 2013-07-02 NOTE — Progress Notes (Signed)
ANTIBIOTIC CONSULT NOTE - FOLLOW UP  Pharmacy Consult for Vancomycin and Primaxin Indication: infected wound  Allergies  Allergen Reactions  . Penicillins Rash   Patient Measurements: Height: 5' (152.4 cm) Weight: 178 lb 12.7 oz (81.1 kg) IBW/kg (Calculated) : 45.5  Vital Signs: Temp: 97.9 F (36.6 C) (09/09 0552) Temp src: Oral (09/09 0552) BP: 120/85 mmHg (09/09 0211) Pulse Rate: 103 (09/09 0552) Intake/Output from previous day: 09/08 0701 - 09/09 0700 In: 360 [P.O.:360] Out: 950 [Urine:950] Intake/Output from this shift:    Labs:  Recent Labs  07/01/13 1540 07/02/13 0612  WBC 10.3 7.9  HGB 12.9 13.2  PLT 233 210  CREATININE 1.40* 1.30*   Estimated Creatinine Clearance: 41.7 ml/min (by C-G formula based on Cr of 1.3). No results found for this basename: VANCOTROUGH, Leodis Binet, VANCORANDOM, GENTTROUGH, GENTPEAK, GENTRANDOM, TOBRATROUGH, TOBRAPEAK, TOBRARND, AMIKACINPEAK, AMIKACINTROU, AMIKACIN,  in the last 72 hours   Microbiology: Recent Results (from the past 720 hour(s))  WOUND CULTURE     Status: None   Collection Time    07/01/13  3:40 PM      Result Value Range Status   Specimen Description WOUND   Final   Special Requests Normal   Final   Gram Stain     Final   Value: NO WBC SEEN     NO SQUAMOUS EPITHELIAL CELLS SEEN     ABUNDANT GRAM POSITIVE COCCI IN PAIRS     Performed at [facility]   Culture PENDING   Incomplete   Report Status PENDING   Incomplete    Anti-infectives   Start     Dose/Rate Route Frequency Ordered Stop   07/02/13 1800  vancomycin (VANCOCIN) 1,500 mg in sodium chloride 0.9 % 500 mL IVPB     1,500 mg 250 mL/hr over 120 Minutes Intravenous Every 24 hours 07/01/13 2236     07/02/13 0900  imipenem-cilastatin (PRIMAXIN) 500 mg in sodium chloride 0.9 % 100 mL IVPB     500 mg 200 mL/hr over 30 Minutes Intravenous Every 8 hours 07/02/13 0750     07/01/13 2300  imipenem-cilastatin (PRIMAXIN) 500 mg in sodium chloride 0.9 %  100 mL IVPB  Status:  Discontinued     500 mg 200 mL/hr over 30 Minutes Intravenous Every 12 hours 07/01/13 2232 07/02/13 0750   07/01/13 1800  vancomycin (VANCOCIN) 1,500 mg in sodium chloride 0.9 % 500 mL IVPB     1,500 mg 250 mL/hr over 120 Minutes Intravenous  Once 07/01/13 1709 07/01/13 1952     Assessment: 62yo female with infected leg wound.  SCr elevated on admission but has improved some this morning.  Estimated Creatinine Clearance: 41.7 ml/min (by C-G formula based on Cr of 1.3).  Goal of Therapy:  Vancomycin trough level 10-15 mcg/ml  Plan:  Vancomycin 1500mg  IV q24h Primaxin 500mg  IV q8h Monitor labs, renal fxn, and cultures  Margo Aye, Anis Degidio A 07/02/2013,7:51 AM

## 2013-07-03 DIAGNOSIS — I4891 Unspecified atrial fibrillation: Secondary | ICD-10-CM

## 2013-07-03 DIAGNOSIS — I5032 Chronic diastolic (congestive) heart failure: Secondary | ICD-10-CM

## 2013-07-03 LAB — BASIC METABOLIC PANEL
BUN: 32 mg/dL — ABNORMAL HIGH (ref 6–23)
CO2: 31 mEq/L (ref 19–32)
Chloride: 106 mEq/L (ref 96–112)
Creatinine, Ser: 1.22 mg/dL — ABNORMAL HIGH (ref 0.50–1.10)
Glucose, Bld: 124 mg/dL — ABNORMAL HIGH (ref 70–99)
Potassium: 3.9 mEq/L (ref 3.5–5.1)

## 2013-07-03 LAB — GLUCOSE, CAPILLARY
Glucose-Capillary: 104 mg/dL — ABNORMAL HIGH (ref 70–99)
Glucose-Capillary: 139 mg/dL — ABNORMAL HIGH (ref 70–99)
Glucose-Capillary: 120 mg/dL — ABNORMAL HIGH (ref 70–99)

## 2013-07-03 LAB — CBC
HCT: 42.2 % (ref 36.0–46.0)
Hemoglobin: 12.6 g/dL (ref 12.0–15.0)
MCHC: 29.9 g/dL — ABNORMAL LOW (ref 30.0–36.0)
MCV: 94.2 fL (ref 78.0–100.0)
RDW: 16.8 % — ABNORMAL HIGH (ref 11.5–15.5)

## 2013-07-03 MED ORDER — FUROSEMIDE 10 MG/ML IJ SOLN
40.0000 mg | Freq: Once | INTRAMUSCULAR | Status: AC
  Administered 2013-07-03: 40 mg via INTRAVENOUS
  Filled 2013-07-03: qty 4

## 2013-07-03 NOTE — Progress Notes (Signed)
[facility] PROGRESS NOTE  Amber Armstrong:[id number] DOB: 05-08-1951 DOA: 07/01/2013 PCP: Pershing Proud  Brief narrative 62 year old female with hypertension, type 2 diabetes mellitus, CHF who was admitted 2 months back for CHF exacerbation requiring intubation for had a right groin hematoma with pseudoaneurysm from the femoral line she had now comes in with a draining wound over the groin with a new necrotic area.  Assessment/Plan: Right groin cellulitis with necrotic area Patient on empiric vancomycin and primaxin. A wound culture pending. Discussed with surgery. Recommend management with antibiotics. Her wound appears to be clinically improving. no further drainage. Patient afebrile with normal Schoen count .continue her on empiric IV antibiotics for today.  If culture-negative i will discharge her on oral abx and HHRN for dressing.  she will be followed by surgery as outpt  CHF Patient is euvolemic. Last echo with normal EF . Continue Lasix, metoprolol and Aldactone.  A. fib Rate controlled. Continue metoprolol  Hypothyroidism Continue Synthroid  Diabetes mellitus Continue glimepiride  and sliding scale insulin  Diet: diabetic  Code Status:  Full Code Family Communication: Daughter in law at Bedside  Disposition Plan: home [place] tomorrow with [facility]   Consultants:  Dr Lovell Sheehan ( surgery)  Procedures:  none  Antibiotics:  IV vanco and primaxin ( 9/8>>)  HPI/Subjective: Feels better. Wound over rt groin improving  Objective: Filed Vitals:   07/03/13 0451  BP: 144/97  Pulse: 98  Temp: 97.3 F (36.3 C)  Resp: 20    Intake/Output Summary (Last 24 hours) at 07/03/13 1127 Last data filed at 07/03/13 0449  Gross per 24 hour  Intake    600 ml  Output    500 ml  Net    100 ml   Filed Weights   07/01/13 2108 07/02/13 0346 07/03/13 0357  Weight: 80.8 kg (178 lb 2.1 oz) 81.1 kg (178 lb 12.7 oz) 85.5 kg (188 lb 7.9 oz)    Exam:   General:   Elderly female in no acute distress  HEENT: No pallor, moist oral mucosa  Chest: Clear to auscultation bilaterally, no added sounds  CVS: S1-S2 is regular, no murmurs or gallop  Abdomen: Soft, nontender, nondistended, bowel sounds present  Extremities: Erythema with  small wound over right groin with packing done. No  discharge or bleeding, leg edema  CNS: AAO x3  Data Reviewed: Basic Metabolic Panel:  Recent Labs Lab 07/01/13 1540 07/02/13 0612 07/03/13 0622  NA 142 142 144  K 3.7 3.6 3.9  CL 99 102 106  CO2 31 29 31   GLUCOSE 120* 90 124*  BUN 44* 39* 32*  CREATININE 1.40* 1.30* 1.22*  CALCIUM 9.3 9.2 9.1   Liver Function Tests:  Recent Labs Lab 07/02/13 0612  AST 23  ALT 15  ALKPHOS 160*  BILITOT 1.0  PROT 7.5  ALBUMIN 3.1*   No results found for this basename: LIPASE, AMYLASE,  in the last 168 hours No results found for this basename: AMMONIA,  in the last 168 hours CBC:  Recent Labs Lab 07/01/13 1540 07/02/13 0612 07/03/13 0622  WBC 10.3 7.9 6.1  HGB 12.9 13.2 12.6  HCT 42.3 43.2 42.2  MCV 93.8 94.3 94.2  PLT 233 210 211   Cardiac Enzymes: No results found for this basename: CKTOTAL, CKMB, CKMBINDEX, TROPONINI,  in the last 168 hours BNP (last 3 results)  Recent Labs  04/14/13 1030 04/26/13 0500 06/14/13 1152  PROBNP 12159.0* 7558.0* 24089.00*   CBG:  Recent Labs Lab 07/02/13 0735 07/02/13  1209 07/02/13 1613 07/02/13 2104 07/03/13 0745  GLUCAP 79 170* 140* 136* 104*    Recent Results (from the past 240 hour(s))  WOUND CULTURE     Status: None   Collection Time    07/01/13  3:40 PM      Result Value Range Status   Specimen Description WOUND   Final   Special Requests Normal   Final   Gram Stain     Final   Value: NO WBC SEEN     NO SQUAMOUS EPITHELIAL CELLS SEEN     ABUNDANT GRAM POSITIVE COCCI IN PAIRS     Performed at [facility]   Culture     Final   Value: Culture reincubated for better growth     Performed  at [facility]   Report Status PENDING   Incomplete     Studies: No results found.  Scheduled Meds: . acidophilus  2 capsule Oral Daily  . furosemide  40 mg Oral BID  . glimepiride  1 mg Oral QAC breakfast  . imipenem-cilastatin  500 mg Intravenous Q8H  . insulin aspart  0-5 Units Subcutaneous QHS  . insulin aspart  0-9 Units Subcutaneous TID WC  . levothyroxine  200 mcg Oral QAC breakfast  . metoprolol  100 mg Oral Daily  . sodium chloride  3 mL Intravenous Q12H  . spironolactone  12.5 mg Oral Daily  . vancomycin  1,500 mg Intravenous Q24H   Continuous Infusions:     Time spent: 25 minutes    Colleen Donahoe  [facility] Pager [phone]. If 7PM-7AM, please contact night-coverage at [email or web address], password [email or web address] 07/03/2013, 11:27 AM  LOS: 2 days

## 2013-07-03 NOTE — Progress Notes (Signed)
Dressing or a change by nurse. Will inspected in a.m. Agree with decision to treat wound with oral antibiotics. I do not think her condition warrants a PICC line with IV antibiotic therapy at this time.

## 2013-07-04 DIAGNOSIS — N179 Acute kidney failure, unspecified: Secondary | ICD-10-CM

## 2013-07-04 LAB — GLUCOSE, CAPILLARY

## 2013-07-04 MED ORDER — SULFAMETHOXAZOLE-TRIMETHOPRIM 800-160 MG PO TABS: 1.0000 | ORAL_TABLET | Freq: Two times a day (BID) | ORAL | Status: AC

## 2013-07-04 MED ORDER — METOPROLOL TARTRATE 50 MG PO TABS: 75.0000 mg | ORAL_TABLET | Freq: Two times a day (BID) | ORAL | Status: DC

## 2013-07-04 MED ORDER — METOPROLOL TARTRATE 50 MG PO TABS
75.0000 mg | ORAL_TABLET | Freq: Two times a day (BID) | ORAL | Status: DC
  Administered 2013-07-04: 75 mg via ORAL
  Filled 2013-07-04: qty 1

## 2013-07-04 NOTE — Discharge Summary (Signed)
Physician Discharge Summary  Amber Armstrong:[id number] DOB: 11-13-1950 DOA: 07/01/2013  PCP: Pershing Proud  Admit date: 07/01/2013 Discharge date: 07/04/2013  Time spent: 40 minutes  Recommendations for Outpatient Follow-up:  1. Discharge Home with [facility] for wound dressing 2. Follow up with PCP and general surgery  Discharge Diagnoses:  Principal Problem:   Cellulitis of right groin  Active Problems:   DIABETES MELLITUS, TYPE II   HYPERLIPIDEMIA   HYPERTENSION   FIBRILLATION, ATRIAL   Wound infection after surgery   Chronic diastolic heart failure   Discharge Condition:fair  Diet recommendation: diabetic  Filed Weights   07/03/13 1300 07/04/13 0600 07/04/13 0618  Weight: 83.5 kg (184 lb 1.4 oz) 84.4 kg (186 lb 1.1 oz) 84.4 kg (186 lb 1.1 oz)    History of present illness:  Please refer to admission H&P for details, but in brief, 62 year old female with hypertension, type 2 diabetes mellitus, CHF who was admitted 2 months back for CHF exacerbation requiring intubation for had a right groin hematoma with pseudoaneurysm from the femoral line she had now comes in with a draining wound over the groin with a new necrotic area.  Hospital Course:  Right groin cellulitis with necrotic area  Patient placed  on empiric vancomycin and primaxin. A wound culture growing GPC in pairs and no staph aureus isolated.. Discussed with surgery. Recommend management with antibiotics. Her wound appears to be clinically improving. no further drainage. Patient afebrile with normal Trull count . i will discharge her on a course of bactrim to complete a 14 day course of abx. she will be followed by surgery as outpt   CHF  . Last echo with normal EF .Marland Kitchen Noted to be slightly volume overloaded on 9/10 and given an extra  dose of IV lasix with improvement. Resume home dose  Lasix, metoprolol and Aldactone.  A. fib  Rate controlled. Continue metoprolol   Hypothyroidism  Continue Synthroid    Diabetes mellitus  Continue glimepiride   Diet: diabetic  Code Status: Full Code  Family Communication: discussed with  Daughter in law Disposition Plan: home with [facility]   Consultants:  Dr Lovell Sheehan ( surgery)   Procedures:  None   Antibiotics:  IV vanco and primaxin ( 9/8>>9/11)   Discharge Exam: Filed Vitals:   07/04/13 0618  BP: 145/91  Pulse: 118  Temp: 97.4 F (36.3 C)  Resp: 20  General: Elderly female in no acute distress  HEENT: No pallor, moist oral mucosa  Chest: Clear to auscultation bilaterally, no added sounds  CVS: S1-S2 is regular, no murmurs or gallop  Abdomen: Soft, nontender, nondistended, bowel sounds present Extremities: Erythema with small wound over right groin with packing done. No discharge or bleeding, trace leg edema  CNS: AAO x3    Discharge Instructions   Future Appointments Provider Department Dept Phone   07/15/2013 2:10 PM Jodelle Gross, NP [facility] [phone]       Medication List         acetaminophen 500 MG tablet  Commonly known as:  TYLENOL  Take 500 mg by mouth every 6 (six) hours as needed for pain.     ferrous fumarate 325 (106 FE) MG Tabs tablet  Commonly known as:  HEMOCYTE - 106 mg FE  Take 1 tablet by mouth.     furosemide 40 MG tablet  Commonly known as:  LASIX  Take 1 tablet (40 mg total) by mouth 2 (two) times daily.     glimepiride  2 MG tablet  Commonly known as:  AMARYL  Take 1 mg by mouth daily before breakfast.     levothyroxine 200 MCG tablet  Commonly known as:  SYNTHROID, LEVOTHROID  Take 200 mcg by mouth daily before breakfast.     metoprolol 50 MG tablet  Commonly known as:  LOPRESSOR  Take 1.5 tablets (75 mg total) by mouth 2 (two) times daily.     spironolactone 25 MG tablet  Commonly known as:  ALDACTONE  Take 0.5 tablets (12.5 mg total) by mouth daily.     sulfamethoxazole-trimethoprim 800-160 MG per tablet  Commonly known as:  SEPTRA DS  Take 1 tablet  by mouth 2 (two) times daily.     vitamin C 500 MG tablet  Commonly known as:  ASCORBIC ACID  Take 500 mg by mouth daily.       Allergies  Allergen Reactions  . Penicillins Rash       Follow-up Information   Follow up with Fabio Bering, MD. Schedule an appointment as soon as possible for a visit in 2 weeks.   Specialty:  General Surgery   Contact information:   [address] [place] [address] [phone]       Follow up with Terie Purser, PA-C In 1 week.   Specialty:  Family Medicine   Contact information:   [address] [place] [address] [phone]        The results of significant diagnostics from this hospitalization (including imaging, microbiology, ancillary and laboratory) are listed below for reference.    Significant Diagnostic Studies: No results found.  Microbiology: Recent Results (from the past 240 hour(s))  WOUND CULTURE     Status: None   Collection Time    07/01/13  3:40 PM      Result Value Range Status   Specimen Description WOUND   Final   Special Requests Normal   Final   Gram Stain     Final   Value: NO WBC SEEN     NO SQUAMOUS EPITHELIAL CELLS SEEN     ABUNDANT GRAM POSITIVE COCCI IN PAIRS     Performed at [facility]   Culture     Final   Value: Culture reincubated for better growth     Performed at [facility]   Report Status PENDING   Incomplete     Labs: Basic Metabolic Panel:  Recent Labs Lab 07/01/13 1540 07/02/13 0612 07/03/13 0622  NA 142 142 144  K 3.7 3.6 3.9  CL 99 102 106  CO2 31 29 31   GLUCOSE 120* 90 124*  BUN 44* 39* 32*  CREATININE 1.40* 1.30* 1.22*  CALCIUM 9.3 9.2 9.1   Liver Function Tests:  Recent Labs Lab 07/02/13 0612  AST 23  ALT 15  ALKPHOS 160*  BILITOT 1.0  PROT 7.5  ALBUMIN 3.1*   No results found for this basename: LIPASE, AMYLASE,  in the last 168 hours No results found for this basename: AMMONIA,  in the last 168  hours CBC:  Recent Labs Lab 07/01/13 1540 07/02/13 0612 07/03/13 0622  WBC 10.3 7.9 6.1  HGB 12.9 13.2 12.6  HCT 42.3 43.2 42.2  MCV 93.8 94.3 94.2  PLT 233 210 211   Cardiac Enzymes: No results found for this basename: CKTOTAL, CKMB, CKMBINDEX, TROPONINI,  in the last 168 hours BNP: BNP (last 3 results)  Recent Labs  04/14/13 1030 04/26/13 0500 06/14/13 1152  PROBNP 12159.0* 7558.0* 24089.00*   CBG:  Recent Labs Lab 07/03/13 0745 07/03/13 1132 07/03/13 1640 07/03/13 2111 07/04/13 0755  GLUCAP 104* 139* 120* 113* 106*       Signed:  Berlin Viereck  [facility] 07/04/2013, 9:42 AM

## 2013-07-04 NOTE — Progress Notes (Signed)
Wound continues to heal well. Agree with discharge. Home health is oriented to all. We'll make arrangements for patient to followup with Dr. Leticia Penna in 2 weeks.

## 2013-07-04 NOTE — Progress Notes (Signed)
Pt discharged home today per Dr. Gonzella Lex. Pt's IV site D/C'd and WNL. Pt's VS stable at this time. Pt provided with home medication list, discharge instructions, and prescriptions. Verbalized understanding. Pt left floor via WC in stable condition accompanied by NT.

## 2013-07-05 ENCOUNTER — Ambulatory Visit: Payer: Self-pay | Admitting: *Deleted

## 2013-07-05 DIAGNOSIS — Z7901 Long term (current) use of anticoagulants: Secondary | ICD-10-CM

## 2013-07-05 DIAGNOSIS — I4891 Unspecified atrial fibrillation: Secondary | ICD-10-CM

## 2013-07-15 ENCOUNTER — Encounter: Payer: Self-pay | Admitting: Adult Health

## 2013-07-15 ENCOUNTER — Ambulatory Visit (INDEPENDENT_AMBULATORY_CARE_PROVIDER_SITE_OTHER): Payer: BC Managed Care – PPO | Admitting: Adult Health

## 2013-07-15 VITALS — BP 134/87 | HR 72 | Ht 60.0 in | Wt 169.0 lb

## 2013-07-15 DIAGNOSIS — I5031 Acute diastolic (congestive) heart failure: Secondary | ICD-10-CM

## 2013-07-15 DIAGNOSIS — I10 Essential (primary) hypertension: Secondary | ICD-10-CM

## 2013-07-15 DIAGNOSIS — I517 Cardiomegaly: Secondary | ICD-10-CM

## 2013-07-15 NOTE — Patient Instructions (Signed)
Your physician recommends that you schedule a follow-up appointment in: 1 month with Dr Branch  Your physician recommends that you continue on your current medications as directed. Please refer to the Current Medication list given to you today.   

## 2013-07-15 NOTE — Assessment & Plan Note (Signed)
She has a history of RHF and CHF. She remains on spironolactone and lasix TID. She has had recent admission to [facility] with creatinine of 1.22 on discharge. With wt loss of 17 lbs we may need to consider decreasing the lasix to BID on next visit as she continues to lose wt. Do not want to have issues with dehydration or syncope.  Follow up with Dr. Wyline Mood in one month at which time we will make decision to adjust medications.

## 2013-07-15 NOTE — Progress Notes (Signed)
HPI: Amber Armstrong is a 62 year old patient formerly of Dr. Juanito Doom, will be seen by Dr. Wyline Mood as primary cardiologist now. We are following her for ongoing assessment and management of chronic atrial fibrillation, right-sided ventricular dysfunction with history of morbid obesity and hypertension. She was last seen in the office on 06/12/2013 status post hospitalization for ventilator dependent respiratory failure in hemorrhagic shock. Patient also had a right groin hematoma from a arterial line. On last visit she was doing well and was not having any complaints at that time. Medications were continued and she is here for close followup. BMET and a CBC were drawn on last visit, sodium 144 potassium 3.9 chloride 106 CO2 31 BUN 32 creatinine 1.2 to; hemoglobin 12.6 hematocrit 42.2 Noon blood cells 6.1 platelets 211.   She has since been back in the hospital due to right groin wound infection at [facility] on 9/8/014. She was placed on IV abx and continued on all other medications. She has lost 17 lbs since last being seen. States she "feels like a million dollars" today. She is without pain, walking daily, and eating right.        Allergies  Allergen Reactions  . Penicillins Rash    Current Outpatient Prescriptions  Medication Sig Dispense Refill  . acetaminophen (TYLENOL) 500 MG tablet Take 500 mg by mouth every 6 (six) hours as needed for pain.      . ferrous fumarate (HEMOCYTE - 106 MG FE) 325 (106 FE) MG TABS tablet Take 1 tablet by mouth.      . furosemide (LASIX) 40 MG tablet Take 40 mg by mouth 3 (three) times daily.      Marland Kitchen glimepiride (AMARYL) 2 MG tablet Take 1 mg by mouth daily before breakfast.       . levothyroxine (SYNTHROID, LEVOTHROID) 200 MCG tablet Take 200 mcg by mouth daily before breakfast.      . metoprolol (LOPRESSOR) 50 MG tablet Take 50 mg by mouth 2 (two) times daily.      Marland Kitchen spironolactone (ALDACTONE) 25 MG tablet Take 25 mg by mouth daily.      Marland Kitchen sulfamethoxazole-trimethoprim  (SEPTRA DS) 800-160 MG per tablet Take 1 tablet by mouth 2 (two) times daily.  24 tablet  0  . vitamin C (ASCORBIC ACID) 500 MG tablet Take 500 mg by mouth daily.       No current facility-administered medications for this visit.    Past Medical History  Diagnosis Date  . Hypertension   . Type 2 diabetes mellitus     Type II  . Atrial fibrillation   . Nephrolithiasis   . Overweight(278.02)   . UTI (urinary tract infection)   . Hypothyroidism (acquired)   . CHF (congestive heart failure)     Past Surgical History  Procedure Laterality Date  . Thyroidectomy    . Groin dissection Right 04/20/2013    Procedure: GROIN EXPLORATION;  Surgeon: Fabio Bering, MD;  Location: [facility];  Service: General;  Laterality: Right;    ZOX:WRUEAV of systems complete and found to be negative unless listed above  PHYSICAL EXAM BP 134/87  Pulse 72  Ht 5' (1.524 m)  Wt 169 lb (76.658 kg)  BMI 33.01 kg/m2  General: Well developed, well nourished, in no acute distress Head: Eyes PERRLA, No xanthomas.   Normal cephalic and atramatic  Lungs: Clear bilaterally to auscultation and percussion. Heart: HRRR S1 S2, soft 1/6 systolic murmur.  Pulses are 2+ & equal.  No carotid bruit. No JVD.  No abdominal bruits. No femoral bruits. Abdomen: Bowel sounds are positive, abdomen soft and non-tender without masses or                  Hernia's noted. Msk:  Back normal, normal gait. Normal strength and tone for age. Extremities: No clubbing, cyanosis or edema, venous statis skin discoloration.  DP +1 Neuro: Alert and oriented X 3. Psych:  Good affect, responds appropriately    ASSESSMENT AND PLAN

## 2013-07-15 NOTE — Progress Notes (Deleted)
Name: Amber Armstrong    DOB: Feb 23, 1951  Age: 62 y.o.  MR#: [id number]       PCP:  Pershing Proud      Insurance: Payor: BLUE CROSS BLUE SHIELD / Plan: BCBS PPO OUT OF STATE / Product Type: *No Product type* /   CC:    Chief Complaint  Patient presents with  . Atrial Fibrillation  . Hypertension    VS Filed Vitals:   07/15/13 1419  BP: 134/87  Pulse: 72  Height: 5' (1.524 m)  Weight: 169 lb (76.658 kg)    Weights Current Weight  07/15/13 169 lb (76.658 kg)  07/04/13 186 lb 1.1 oz (84.4 kg)  06/12/13 187 lb (84.823 kg)    Blood Pressure  BP Readings from Last 3 Encounters:  07/15/13 134/87  07/04/13 145/91  06/12/13 149/103     Admit date:  (Not on file) Last encounter with RMR:  06/12/2013   Allergy Penicillins  Current Outpatient Prescriptions  Medication Sig Dispense Refill  . acetaminophen (TYLENOL) 500 MG tablet Take 500 mg by mouth every 6 (six) hours as needed for pain.      . ferrous fumarate (HEMOCYTE - 106 MG FE) 325 (106 FE) MG TABS tablet Take 1 tablet by mouth.      . furosemide (LASIX) 40 MG tablet Take 40 mg by mouth 3 (three) times daily.      Marland Kitchen glimepiride (AMARYL) 2 MG tablet Take 1 mg by mouth daily before breakfast.       . levothyroxine (SYNTHROID, LEVOTHROID) 200 MCG tablet Take 200 mcg by mouth daily before breakfast.      . metoprolol (LOPRESSOR) 50 MG tablet Take 50 mg by mouth 2 (two) times daily.      Marland Kitchen spironolactone (ALDACTONE) 25 MG tablet Take 25 mg by mouth daily.      Marland Kitchen sulfamethoxazole-trimethoprim (SEPTRA DS) 800-160 MG per tablet Take 1 tablet by mouth 2 (two) times daily.  24 tablet  0  . vitamin C (ASCORBIC ACID) 500 MG tablet Take 500 mg by mouth daily.       No current facility-administered medications for this visit.    Discontinued Meds:    Medications Discontinued During This Encounter  Medication Reason  . metoprolol (LOPRESSOR) 50 MG tablet   . spironolactone (ALDACTONE) 25 MG tablet   . furosemide (LASIX) 40  MG tablet     Patient Active Problem List   Diagnosis Date Noted  . Wound infection after surgery 07/01/2013  . Chronic diastolic heart failure 07/01/2013  . Cellulitis of right groin 07/01/2013  . Hematoma of groin 04/20/2013  . Pseudoaneurysm 04/20/2013  . Acute blood loss anemia 04/20/2013  . Hemorrhagic shock 04/20/2013  . Acute diastolic heart failure 04/15/2013  . Acute respiratory failure 04/14/2013  . Acute renal failure 04/14/2013  . Right ventricular hypertrophy 12/12/2012  . Bilateral lower extremity edema 03/28/2012  . Long term current use of anticoagulant 02/01/2011  . HYPERLIPIDEMIA 06/14/2010  . HYPOKALEMIA, MILD 04/02/2010  . DIABETES MELLITUS, TYPE II 05/05/2009  . Morbid obesity with BMI of 40.0-44.9, adult 05/05/2009  . HYPERTENSION 05/05/2009  . FIBRILLATION, ATRIAL 05/05/2009  . NEPHROLITHIASIS 05/05/2009  . UTI 05/05/2009  . THYROIDECTOMY, HX OF 05/05/2009    LABS    Component Value Date/Time   NA 144 07/03/2013 0622   NA 142 07/02/2013 0612   NA 142 07/01/2013 1540   K 3.9 07/03/2013 0622   K 3.6 07/02/2013 0612   K 3.7 07/01/2013  1540   CL 106 07/03/2013 0622   CL 102 07/02/2013 0612   CL 99 07/01/2013 1540   CO2 31 07/03/2013 0622   CO2 29 07/02/2013 0612   CO2 31 07/01/2013 1540   GLUCOSE 124* 07/03/2013 0622   GLUCOSE 90 07/02/2013 0612   GLUCOSE 120* 07/01/2013 1540   BUN 32* 07/03/2013 0622   BUN 39* 07/02/2013 0612   BUN 44* 07/01/2013 1540   CREATININE 1.22* 07/03/2013 0622   CREATININE 1.30* 07/02/2013 0612   CREATININE 1.40* 07/01/2013 1540   CREATININE 1.35* 06/14/2013 1152   CREATININE 1.09 04/11/2012 0953   CALCIUM 9.1 07/03/2013 0622   CALCIUM 9.2 07/02/2013 0612   CALCIUM 9.3 07/01/2013 1540   GFRNONAA 46* 07/03/2013 0622   GFRNONAA 43* 07/02/2013 0612   GFRNONAA 39* 07/01/2013 1540   GFRAA 53* 07/03/2013 0622   GFRAA 50* 07/02/2013 0612   GFRAA 45* 07/01/2013 1540   CMP     Component Value Date/Time   NA 144 07/03/2013 0622   K 3.9 07/03/2013 0622   CL 106  07/03/2013 0622   CO2 31 07/03/2013 0622   GLUCOSE 124* 07/03/2013 0622   BUN 32* 07/03/2013 0622   CREATININE 1.22* 07/03/2013 0622   CREATININE 1.35* 06/14/2013 1152   CALCIUM 9.1 07/03/2013 0622   PROT 7.5 07/02/2013 0612   ALBUMIN 3.1* 07/02/2013 0612   AST 23 07/02/2013 0612   ALT 15 07/02/2013 0612   ALKPHOS 160* 07/02/2013 0612   BILITOT 1.0 07/02/2013 0612   GFRNONAA 46* 07/03/2013 0622   GFRAA 53* 07/03/2013 0622       Component Value Date/Time   WBC 6.1 07/03/2013 0622   WBC 7.9 07/02/2013 0612   WBC 10.3 07/01/2013 1540   HGB 12.6 07/03/2013 0622   HGB 13.2 07/02/2013 0612   HGB 12.9 07/01/2013 1540   HCT 42.2 07/03/2013 0622   HCT 43.2 07/02/2013 0612   HCT 42.3 07/01/2013 1540   MCV 94.2 07/03/2013 0622   MCV 94.3 07/02/2013 0612   MCV 93.8 07/01/2013 1540    Lipid Panel     Component Value Date/Time   CHOL 212* 07/26/2010 2028   TRIG 96 04/23/2013 0451   HDL 43 07/26/2010 2028   CHOLHDL 4.9 Ratio 07/26/2010 2028   VLDL 33 07/26/2010 2028   LDLCALC 136* 07/26/2010 2028    ABG    Component Value Date/Time   PHART 7.462* 04/24/2013 0855   PCO2ART 58.6* 04/24/2013 0855   PO2ART 77.7* 04/24/2013 0855   HCO3 41.3* 04/24/2013 0855   TCO2 37.0 04/24/2013 0855   O2SAT 95.8 04/24/2013 0855     Lab Results  Component Value Date   TSH 28.805* 07/01/2013   BNP (last 3 results)  Recent Labs  04/14/13 1030 04/26/13 0500 06/14/13 1152  PROBNP 12159.0* 7558.0* 24089.00*   Cardiac Panel (last 3 results) No results found for this basename: CKTOTAL, CKMB, TROPONINI, RELINDX,  in the last 72 hours  Iron/TIBC/Ferritin No results found for this basename: iron, tibc, ferritin     EKG Orders placed during the hospital encounter of 07/01/13  . EKG 12-LEAD  . EKG 12-LEAD  . EKG     Prior Assessment and Plan Problem List as of 07/15/2013     Cardiovascular and Mediastinum   HYPERTENSION   Last Assessment & Plan   12/12/2012 Office Visit Written 12/12/2012  3:17 PM by Gaylord Shih, MD     Good control.     FIBRILLATION, ATRIAL   Last Assessment &  Plan   06/12/2013 Office Visit Written 06/12/2013  2:20 PM by Jodelle Gross, NP     Heart rate is well controlled currently. She is without cardiac complaint. Her sister is very attentive and helps her with her medications. She is doing well. She is not currently on coumadin due to extensive right groin bleed in the setting of central line placement requiring multiple transfusions.  Will not place her on this now as she continues to have a wound with ongoing wound care.     Right ventricular hypertrophy   Acute diastolic heart failure   Last Assessment & Plan   06/12/2013 Office Visit Written 06/12/2013  2:29 PM by Jodelle Gross, NP     She does not appear to be fluid overloaded, but continues to have some non-pitting edema. She remains essentially sedentary but will walk a few times a day with cane to increase exercise tolerance. Will have follow up labs completed to include CMET, CBC, Pro-BNP and Hgb A1C.  She will follow up in one month.    Pseudoaneurysm   Hemorrhagic shock   Chronic diastolic heart failure     Respiratory   Acute respiratory failure     Endocrine   DIABETES MELLITUS, TYPE II     Genitourinary   NEPHROLITHIASIS   UTI   Acute renal failure   Last Assessment & Plan   06/12/2013 Office Visit Written 06/12/2013  2:31 PM by Jodelle Gross, NP     CMET ordered.      Other   HYPERLIPIDEMIA   Last Assessment & Plan   12/12/2012 Office Visit Edited 12/12/2012  3:20 PM by Gaylord Shih, MD     She says her lipids have been good. I do not have any recent values. With her diabetes, she probably should. Followup with primary care with blood work before starting drug. Goal LDL certainly less than 100.    HYPOKALEMIA, MILD   Morbid obesity with BMI of 40.0-44.9, adult   Last Assessment & Plan   03/28/2012 Office Visit Written 03/28/2012  3:44 PM by Gaylord Shih, MD     I strongly encouraged her to lose weight.      THYROIDECTOMY, HX OF   Long term current use of anticoagulant   Bilateral lower extremity edema   Last Assessment & Plan   12/12/2012 Office Visit Written 12/12/2012  3:17 PM by Gaylord Shih, MD     Improved. No change in current medications.    Hematoma of groin   Last Assessment & Plan   06/12/2013 Office Visit Written 06/12/2013  2:31 PM by Jodelle Gross, NP     Dressing partially removed and wound found to be healthy. No bleeding or signs of infection. Lower right leg is sore to touch and with weight bearing. Continue to follow.    Acute blood loss anemia   Wound infection after surgery   Cellulitis of right groin       Imaging: No results found.

## 2013-07-15 NOTE — Assessment & Plan Note (Signed)
She appears well compensated at this time. No evidence of fluid overload or DOE. She is walking each day without need to stop or catch her breath.

## 2013-07-15 NOTE — Assessment & Plan Note (Signed)
Excellent control of BP at this time. No to changes current medications.

## 2013-08-20 ENCOUNTER — Encounter: Payer: Self-pay | Admitting: *Deleted

## 2013-08-20 ENCOUNTER — Telehealth: Payer: Self-pay | Admitting: Cardiology

## 2013-08-20 NOTE — Telephone Encounter (Signed)
I have never seen this patient clinically. She had an appointment with me next week that was cancelled. Can you refer this question to Natalia Leatherwood who saw her last month?    Dina Rich

## 2013-08-20 NOTE — Telephone Encounter (Signed)
Needs letter to return to work / tgs

## 2013-08-20 NOTE — Telephone Encounter (Signed)
Please advise if a note can be provided for the pt to go back to work and when

## 2013-08-20 NOTE — Telephone Encounter (Signed)
She may return to work if she is asymptomatic. She should still follow up with Dr. Wyline Mood on scheduled appt

## 2013-08-20 NOTE — Telephone Encounter (Signed)
PLEASE ADVISE.

## 2013-08-20 NOTE — Telephone Encounter (Signed)
Spoke to pt to advise results/instructions. Pt understood. Pt will pick up letter placed at desk per pt notes she is asymptomatic, pt will keep December apt scheduled with Dr Wyline Mood

## 2013-08-26 ENCOUNTER — Ambulatory Visit: Payer: BC Managed Care – PPO | Admitting: Cardiology

## 2013-10-04 ENCOUNTER — Other Ambulatory Visit: Payer: Self-pay | Admitting: Internal Medicine

## 2013-10-09 ENCOUNTER — Ambulatory Visit (INDEPENDENT_AMBULATORY_CARE_PROVIDER_SITE_OTHER): Payer: BC Managed Care – PPO | Admitting: Cardiology

## 2013-10-09 ENCOUNTER — Encounter: Payer: Self-pay | Admitting: Cardiology

## 2013-10-09 VITALS — BP 128/68 | HR 74 | Ht 62.0 in | Wt 162.0 lb

## 2013-10-09 DIAGNOSIS — I509 Heart failure, unspecified: Secondary | ICD-10-CM

## 2013-10-09 DIAGNOSIS — I4891 Unspecified atrial fibrillation: Secondary | ICD-10-CM

## 2013-10-09 DIAGNOSIS — I50810 Right heart failure, unspecified: Secondary | ICD-10-CM

## 2013-10-09 MED ORDER — ASPIRIN EC 325 MG PO TBEC: 325.0000 mg | DELAYED_RELEASE_TABLET | Freq: Every day | ORAL | Status: DC

## 2013-10-09 MED ORDER — METOPROLOL TARTRATE 50 MG PO TABS: 75.0000 mg | ORAL_TABLET | Freq: Two times a day (BID) | ORAL | Status: DC

## 2013-10-09 NOTE — Progress Notes (Signed)
Clinical Summary Ms. Hyneman is a 62 y.o.female former patient of Dr Daleen Squibb, this is our first visit together. She was seen for the following medical problems.  1. Afib - denies any palpitations. No SOB or DOE. - she is not on blood thinner due to prior history of bleeding during a prior hospitalization after a right groin pseduoaneurysm and hematoma developed after femoral central line placement in 03/2013 while on lovenox for afib anticoagulation, with hemorrhagic shock requiring emergency surgery 04/14/13 with suture repair of right femoral vein and artery. Healing of this area had been delayed by infection with prolonged wound care, this seems to have healed now. - it appears she has been off anticoagulation for her afib since this incident - she denies any palpitations  2. Right sided heart failure - denies any DOE, no significant LE edema - compliant with diuretics, last Cr 06/2013 Cr 1.22 BUN 32 GFR 46 - the etiology of her right sided heart failure is unclear from the available notes.    Past Medical History  Diagnosis Date  . Hypertension   . Type 2 diabetes mellitus     Type II  . Atrial fibrillation   . Nephrolithiasis   . Overweight(278.02)   . UTI (urinary tract infection)   . Hypothyroidism (acquired)   . CHF (congestive heart failure)      Allergies  Allergen Reactions  . Penicillins Rash     Current Outpatient Prescriptions  Medication Sig Dispense Refill  . acetaminophen (TYLENOL) 500 MG tablet Take 500 mg by mouth every 6 (six) hours as needed for pain.      . ferrous fumarate (HEMOCYTE - 106 MG FE) 325 (106 FE) MG TABS tablet Take 1 tablet by mouth.      . furosemide (LASIX) 40 MG tablet Take 40 mg by mouth 3 (three) times daily.      Marland Kitchen glimepiride (AMARYL) 2 MG tablet Take 1 mg by mouth daily before breakfast.       . levothyroxine (SYNTHROID, LEVOTHROID) 200 MCG tablet Take 200 mcg by mouth daily before breakfast.      . metoprolol (LOPRESSOR) 50 MG  tablet Take 50 mg by mouth 2 (two) times daily.      Marland Kitchen spironolactone (ALDACTONE) 25 MG tablet Take 25 mg by mouth daily.      . vitamin C (ASCORBIC ACID) 500 MG tablet Take 500 mg by mouth daily.       No current facility-administered medications for this visit.     Past Surgical History  Procedure Laterality Date  . Thyroidectomy    . Groin dissection Right 04/20/2013    Procedure: GROIN EXPLORATION;  Surgeon: Fabio Bering, MD;  Location: [facility];  Service: General;  Laterality: Right;     Allergies  Allergen Reactions  . Penicillins Rash      Family History  Problem Relation Age of Onset  . Arrhythmia Mother     Atrial fib     Social History Ms. Consalvo reports that she has never smoked. She has never used smokeless tobacco. Ms. Schlosser reports that she does not drink alcohol.   Review of Systems CONSTITUTIONAL: No weight loss, fever, chills, weakness or fatigue.  HEENT: Eyes: No visual loss, blurred vision, double vision or yellow sclerae.No hearing loss, sneezing, congestion, runny nose or sore throat.  SKIN: No rash or itching.  CARDIOVASCULAR: per HPI RESPIRATORY: No shortness of breath, cough or sputum.  GASTROINTESTINAL: No anorexia, nausea, vomiting or diarrhea.  No abdominal pain or blood.  GENITOURINARY: No burning on urination, no polyuria NEUROLOGICAL: No headache, dizziness, syncope, paralysis, ataxia, numbness or tingling in the extremities. No change in bowel or bladder control.  MUSCULOSKELETAL: No muscle, back pain, joint pain or stiffness.  LYMPHATICS: No enlarged nodes. No history of splenectomy.  PSYCHIATRIC: No history of depression or anxiety.  ENDOCRINOLOGIC: No reports of sweating, cold or heat intolerance. No polyuria or polydipsia.  Marland Kitchen   Physical Examination p 74 bp 128/68 Wt 162 lbs BMI 30 Gen: resting comfortably, no acute distress HEENT: no scleral icterus, pupils equal round and reactive, no palptable cervical adenopathy,  CV: irreg,  no m/r/g, no JVD, no carotid bruits Resp: Clear to auscultation bilaterally GI: abdomen is soft, non-tender, non-distended, normal bowel sounds, no hepatosplenomegaly MSK: extremities are warm, no edema.  Skin: warm, no rash Neuro:  no focal deficits Psych: appropriate affect   Diagnostic Studies 03/2013 Echo LVEF 65-70%, normal WMAs, cannot assess diastolic function, flattened ventricular septum, severely dilated RV, low systolic function, mod RAE, could not estimate PASP    Assessment and Plan  1. Afib - denies any current symptoms - complicated bleeding history as described above after femoral line placement with prolonged healing, she has been off anticoagulation since this time. Wound appears to have healed well - will start ASA 325mg  daily now, will touch base with Dr Leticia Penna to see if he has any feelings about restarting anticoagulation this far out from her surgery. Her CHADS2Vasc score is 3, anticoagulation is indicated if not contraindicated  2. Chronic right sided heart failure - the etiology is unclear from the reviewed notes, there is mention of cor pulmonale but she does not have any documented pulmonary disease - will review records further, may require further workup on her right sided dysfunction. Will review echo, her diastolic function is not clear from reports.    Follow up 2 months    Antoine Poche, M.D., F.A.C.C.

## 2013-10-09 NOTE — Patient Instructions (Addendum)
Your physician recommends that you schedule a follow-up appointment in: 2 MONTHS WITH DR [facility]  Your physician has recommended you make the following change in your medication:  1. START ASPIRIN 325 MG DAILY

## 2013-12-18 ENCOUNTER — Ambulatory Visit (INDEPENDENT_AMBULATORY_CARE_PROVIDER_SITE_OTHER): Payer: BC Managed Care – PPO | Admitting: Cardiology

## 2013-12-18 ENCOUNTER — Encounter: Payer: Self-pay | Admitting: Cardiology

## 2013-12-18 VITALS — BP 139/72 | HR 71 | Ht 60.0 in | Wt 161.0 lb

## 2013-12-18 DIAGNOSIS — I509 Heart failure, unspecified: Secondary | ICD-10-CM

## 2013-12-18 DIAGNOSIS — I10 Essential (primary) hypertension: Secondary | ICD-10-CM

## 2013-12-18 DIAGNOSIS — I50812 Chronic right heart failure: Secondary | ICD-10-CM

## 2013-12-18 NOTE — Patient Instructions (Signed)
Your physician recommends that you schedule a follow-up appointment in: 2 MONTHS  Your physician has requested that you have an echocardiogram. Echocardiography is a painless test that uses sound waves to create images of your heart. It provides your doctor with information about the size and shape of your heart and how well your heart's chambers and valves are working. This procedure takes approximately one hour. There are no restrictions for this procedure.  WE WILL CALL YOU WITH YOUR TEST RESULTS/INSTRUCTIONS/NEXT STEPS ONCE RECEIVED BY THE PROVIDER

## 2013-12-18 NOTE — Progress Notes (Addendum)
Clinical Summary Amber Armstrong is a 63 y.o.female seen today for follow of the following medical problems.   1. Afib  - denies any palpitations. No SOB or DOE.  - she is not on blood thinner due to prior history of bleeding during a prior hospitalization after a right groin pseduoaneurysm and hematoma developed after femoral central line placement in 03/2013 while on lovenox for afib anticoagulation, with hemorrhagic shock requiring emergency surgery 04/14/13 with suture repair of right femoral vein and artery. Healing of this area had been delayed by infection with prolonged wound care, this seems to have healed now.  - it appears she has been off anticoagulation for her afib since this incident  - she denies any palpitations   2. Right sided heart failure  - denies any DOE, no significant LE edema  - the etiology of her right sided heart failure is unclear from the available notes.  - No LE edema, no SOB or DOE though fairly sedentary lifestyle   Past Medical History  Diagnosis Date  . Hypertension   . Type 2 diabetes mellitus     Type II  . Atrial fibrillation   . Nephrolithiasis   . Overweight   . UTI (urinary tract infection)   . Hypothyroidism (acquired)   . CHF (congestive heart failure)      Allergies  Allergen Reactions  . Penicillins Rash     Current Outpatient Prescriptions  Medication Sig Dispense Refill  . acetaminophen (TYLENOL) 500 MG tablet Take 500 mg by mouth every 6 (six) hours as needed for pain.      Marland Kitchen. aspirin EC 325 MG tablet Take 1 tablet (325 mg total) by mouth daily.  30 tablet  0  . ferrous fumarate (HEMOCYTE - 106 MG FE) 325 (106 FE) MG TABS tablet Take 1 tablet by mouth.      . furosemide (LASIX) 40 MG tablet Take 20 mg by mouth 2 (two) times daily.       Marland Kitchen. glimepiride (AMARYL) 2 MG tablet Take 1 mg by mouth daily before breakfast.       . levothyroxine (SYNTHROID, LEVOTHROID) 200 MCG tablet Take 125 mcg by mouth daily before breakfast.       .  metoprolol (LOPRESSOR) 50 MG tablet Take 1.5 tablets (75 mg total) by mouth 2 (two) times daily.  60 tablet  6  . ONETOUCH VERIO test strip       . spironolactone (ALDACTONE) 25 MG tablet Take 25 mg by mouth daily.      . vitamin C (ASCORBIC ACID) 500 MG tablet Take 500 mg by mouth daily.       No current facility-administered medications for this visit.     Past Surgical History  Procedure Laterality Date  . Thyroidectomy    . Groin dissection Right 04/20/2013    Procedure: GROIN EXPLORATION;  Surgeon: Fabio BeringBrent C Ziegler, MD;  Location: [facility];  Service: General;  Laterality: Right;     Allergies  Allergen Reactions  . Penicillins Rash      Family History  Problem Relation Age of Onset  . Arrhythmia Mother     Atrial fib     Social History Ms. Sasaki reports that she has never smoked. She has never used smokeless tobacco. Amber Armstrong reports that she does not drink alcohol.   Review of Systems CONSTITUTIONAL: No weight loss, fever, chills, weakness or fatigue.  HEENT: Eyes: No visual loss, blurred vision, double vision or yellow sclerae.No  hearing loss, sneezing, congestion, runny nose or sore throat.  SKIN: No rash or itching.  CARDIOVASCULAR: per HPI RESPIRATORY: No shortness of breath, cough or sputum.  GASTROINTESTINAL: No anorexia, nausea, vomiting or diarrhea. No abdominal pain or blood.  GENITOURINARY: No burning on urination, no polyuria NEUROLOGICAL: No headache, dizziness, syncope, paralysis, ataxia, numbness or tingling in the extremities. No change in bowel or bladder control.  MUSCULOSKELETAL: No muscle, back pain, joint pain or stiffness.  LYMPHATICS: No enlarged nodes. No history of splenectomy.  PSYCHIATRIC: No history of depression or anxiety.  ENDOCRINOLOGIC: No reports of sweating, cold or heat intolerance. No polyuria or polydipsia.  Marland Kitchen   Physical Examination p 71 bp 139/72 Wt 161 lbs BMI 31 Gen: resting comfortably, no acute distress HEENT: no  scleral icterus, pupils equal round and reactive, no palptable cervical adenopathy,  RA:QTMAU, no m/r/g, no JVD, no carotid bruits Resp: Clear to auscultation bilaterally GI: abdomen is soft, non-tender, non-distended, normal bowel sounds, no hepatosplenomegaly MSK: extremities are warm, no edema.  Skin: warm, no rash Neuro:  no focal deficits Psych: appropriate affect   Diagnostic Studies 03/2013 Echo  LVEF 65-70%, normal WMAs, cannot assess diastolic function, flattened ventricular septum, severely dilated RV, low systolic function, mod RAE, could not estimate PASP   Assessment and Plan  1. Afib  - denies any current symptoms  - complicated bleeding history as described above after femoral line placement with prolonged healing, she has been off anticoagulation since this time. Wound appears to have healed well  - tried contacting Dr Leticia Penna at last visit through epic however received no response, patient reports that he has been on work leave. Will attempt to contact Dr Franky Macho who has also been involved with her case from a surgicla standpoint. Will inquire opinion on restarting anticoag for her -Her CHADS2Vasc score is 3, anticoagulation is indicated if not contraindicated   2. Chronic right sided heart failure  - the etiology is unclear from the reviewed notes, there is mention of cor pulmonale but she does not have any documented pulmonary disease  - her echo 03/2013 was in the setting of severe volume overload, since that time she has lost 50 lbs, a goodportion which may have been fluid - will repeat echo now that lower left sided filling pressures, if evidence of elevated right sided pressure still exists will require further eval including RHC.     Follow up 2 months   Antoine Poche, M.D., F.A.C.C.  Addendum 12/20/13 Hear back from surgery Dr Lovell Sheehan, he is not opposed to restarting anticoagulation. Will order labs: CMET, INR, CBC prior to initiating. Pending  results, will decide on coumadin vs new oral anticoag agent.   Dina Rich MD  Addendum 12/25/13 Spoke with patient, repeat echo continues to show evidence of pulmonary HTN and right sided chamber enlargement of unclear etiology. Discussed RHC with her, she would like to hold off for now. We will restart her coumadin, obtain PFTs to start with.  Dina Rich MD

## 2013-12-20 ENCOUNTER — Telehealth: Payer: Self-pay | Admitting: *Deleted

## 2013-12-20 DIAGNOSIS — Z7901 Long term (current) use of anticoagulants: Secondary | ICD-10-CM

## 2013-12-20 DIAGNOSIS — I10 Essential (primary) hypertension: Secondary | ICD-10-CM

## 2013-12-20 NOTE — Telephone Encounter (Signed)
Pt understood and advised she has to get labs done for PCP as well at the same lab office we use, advised pt to inform the lab that she has two providers requesting labs and to look for two separate orders, pt understood and will inform the lab office, pt aware we will call her with the results once they come in, orders placed in chart for CMET,INR,CBC

## 2013-12-20 NOTE — Telephone Encounter (Signed)
Message copied by Ovidio Kin on Fri Dec 20, 2013  5:21 PM ------      Message from: [place] F      Created: Fri Dec 20, 2013 10:12 AM       Please let patient know that we heard back from Dr Lovell Sheehan, and he is ok with [place] restarting blood thinners. We need a CMET, INR, CBC before starting, please order for her.                  Dina Rich MD ------

## 2013-12-23 ENCOUNTER — Ambulatory Visit (HOSPITAL_COMMUNITY)
Admission: RE | Admit: 2013-12-23 | Discharge: 2013-12-23 | Disposition: A | Discharge status: Home / Self Care | Payer: BC Managed Care – PPO | Source: Ambulatory Visit | Attending: Cardiology | Admitting: Cardiology

## 2013-12-23 DIAGNOSIS — Z6831 Body mass index (BMI) 31.0-31.9, adult: Secondary | ICD-10-CM | POA: Insufficient documentation

## 2013-12-23 DIAGNOSIS — I369 Nonrheumatic tricuspid valve disorder, unspecified: Secondary | ICD-10-CM

## 2013-12-23 DIAGNOSIS — I10 Essential (primary) hypertension: Secondary | ICD-10-CM

## 2013-12-23 DIAGNOSIS — E119 Type 2 diabetes mellitus without complications: Secondary | ICD-10-CM | POA: Insufficient documentation

## 2013-12-23 DIAGNOSIS — I4891 Unspecified atrial fibrillation: Secondary | ICD-10-CM | POA: Insufficient documentation

## 2013-12-23 DIAGNOSIS — E785 Hyperlipidemia, unspecified: Secondary | ICD-10-CM | POA: Insufficient documentation

## 2013-12-23 DIAGNOSIS — I5031 Acute diastolic (congestive) heart failure: Secondary | ICD-10-CM

## 2013-12-23 DIAGNOSIS — I509 Heart failure, unspecified: Secondary | ICD-10-CM | POA: Insufficient documentation

## 2013-12-23 NOTE — Progress Notes (Signed)
*  PRELIMINARY RESULTS* Echocardiogram 2D Echocardiogram has been performed.  Amber Armstrong 12/23/2013, 2:42 PM

## 2013-12-24 ENCOUNTER — Telehealth: Payer: Self-pay

## 2013-12-24 DIAGNOSIS — I272 Pulmonary hypertension, unspecified: Secondary | ICD-10-CM

## 2013-12-24 NOTE — Telephone Encounter (Signed)
Message copied by Nori Riis on Tue Dec 24, 2013  4:22 PM ------      Message from: [place] F      Created: Tue Dec 24, 2013 12:02 PM       Please let patient know that her echo continues to show evidence of right sided enlargement and pulmonary HTN. We need to begin to start working this up, please order full PFTs for her with [facility].            Dina Rich MD ------

## 2013-12-24 NOTE — Telephone Encounter (Signed)
Pt to be scheduled for pft's Pt asked [place] to give apt time to her mother who resides with her

## 2013-12-24 NOTE — Telephone Encounter (Signed)
Message copied by Nori Riis on Tue Dec 24, 2013 12:23 PM ------      Message from: [place] F      Created: Tue Dec 24, 2013 12:02 PM       Please let patient know that her echo continues to show evidence of right sided enlargement and pulmonary HTN. We need to begin to start working this up, please order full PFTs for her with [facility].            Dina Rich MD ------

## 2013-12-24 NOTE — Telephone Encounter (Signed)
Spoke with mother where pt resides,unable to reach at work [employer])  Pt needs PFT's I will order,mother states patient is off tomorrow (wed 12/25/13) and pt will call back then

## 2013-12-25 LAB — COMPREHENSIVE METABOLIC PANEL
ALBUMIN: 4 g/dL (ref 3.5–5.2)
ALT: 47 U/L — ABNORMAL HIGH (ref 0–35)
AST: 27 U/L (ref 0–37)
Alkaline Phosphatase: 86 U/L (ref 39–117)
BUN: 51 mg/dL — ABNORMAL HIGH (ref 6–23)
CHLORIDE: 103 meq/L (ref 96–112)
CO2: 25 mEq/L (ref 19–32)
Calcium: 9.5 mg/dL (ref 8.4–10.5)
Creat: 1.37 mg/dL — ABNORMAL HIGH (ref 0.50–1.10)
Glucose, Bld: 183 mg/dL — ABNORMAL HIGH (ref 70–99)
POTASSIUM: 4 meq/L (ref 3.5–5.3)
Sodium: 139 mEq/L (ref 135–145)
TOTAL PROTEIN: 7.2 g/dL (ref 6.0–8.3)
Total Bilirubin: 0.7 mg/dL (ref 0.2–1.2)

## 2013-12-25 LAB — CBC
HCT: 45.8 % (ref 36.0–46.0)
Hemoglobin: 15.4 g/dL — ABNORMAL HIGH (ref 12.0–15.0)
MCH: 29.3 pg (ref 26.0–34.0)
MCHC: 33.6 g/dL (ref 30.0–36.0)
MCV: 87.2 fL (ref 78.0–100.0)
PLATELETS: 159 10*3/uL (ref 150–400)
RBC: 5.25 MIL/uL — AB (ref 3.87–5.11)
RDW: 13.1 % (ref 11.5–15.5)
WBC: 6.8 10*3/uL (ref 4.0–10.5)

## 2013-12-25 LAB — PROTIME-INR
INR: 0.99 (ref <1.50)
Prothrombin Time: 13 seconds (ref 11.6–15.2)

## 2013-12-26 NOTE — Telephone Encounter (Signed)
error 

## 2013-12-30 ENCOUNTER — Encounter: Payer: Self-pay | Admitting: *Deleted

## 2014-01-07 ENCOUNTER — Encounter (HOSPITAL_COMMUNITY): Payer: BC Managed Care – PPO

## 2014-02-17 ENCOUNTER — Ambulatory Visit: Payer: BC Managed Care – PPO | Admitting: Cardiology

## 2014-03-05 ENCOUNTER — Ambulatory Visit (INDEPENDENT_AMBULATORY_CARE_PROVIDER_SITE_OTHER): Payer: BC Managed Care – PPO | Admitting: Adult Health

## 2014-03-05 ENCOUNTER — Encounter: Payer: Self-pay | Admitting: Adult Health

## 2014-03-05 VITALS — BP 130/68 | HR 95 | Ht 60.0 in | Wt 168.0 lb

## 2014-03-05 DIAGNOSIS — I5032 Chronic diastolic (congestive) heart failure: Secondary | ICD-10-CM

## 2014-03-05 DIAGNOSIS — I4891 Unspecified atrial fibrillation: Secondary | ICD-10-CM

## 2014-03-05 DIAGNOSIS — I10 Essential (primary) hypertension: Secondary | ICD-10-CM

## 2014-03-05 NOTE — Assessment & Plan Note (Signed)
I have discussed with her the need to return to anticoagulation therapy as she is now stable. She had been on coumadin therapy before hematoma and C-line bleed and is willing to return to this therapy. I have reviewed notes of Dr.Branch who has checked in with surgery who is ok with return to anticoagulation therapy as well.   She will be scheduled in the coumadin clinic this week to be restarted on coumadin therapy.

## 2014-03-05 NOTE — Assessment & Plan Note (Signed)
No evidence of fluid retention. She is maintaining her weight and avoiding salt.  Will be seen again in 3 months.,

## 2014-03-05 NOTE — Progress Notes (Deleted)
Name: Amber Armstrong    DOB: 08-23-1951  Age: 63 y.o.  MR#: [id number]       PCP:  No primary provider on file.      Insurance: Payor: Pharmacist, hospital / Plan: [employer] [place] P[employer] / Product Type: *No Product type* /   CC:    Chief Complaint  Patient presents with  . Hypertension  . Congestive Heart Failure    VS Filed Vitals:   03/05/14 1405  BP: 130/68  Pulse: 95  Height: 5' (1.524 m)  Weight: 168 lb (76.204 kg)    Weights Current Weight  03/05/14 168 lb (76.204 kg)  12/18/13 161 lb (73.029 kg)  10/09/13 162 lb (73.483 kg)    Blood Pressure  BP Readings from Last 3 Encounters:  03/05/14 130/68  12/18/13 139/72  10/09/13 128/68     Admit date:  (Not on file) Last encounter with RMR:  Visit date not found   Allergy Penicillins  Current Outpatient Prescriptions  Medication Sig Dispense Refill  . acetaminophen (TYLENOL) 500 MG tablet Take 500 mg by mouth every 6 (six) hours as needed for pain.      Marland Kitchen aspirin EC 81 MG tablet Take 81 mg by mouth daily.      . furosemide (LASIX) 40 MG tablet Take 20 mg by mouth 2 (two) times daily.       Marland Kitchen glimepiride (AMARYL) 2 MG tablet Take 1 mg by mouth daily before breakfast.       . levothyroxine (SYNTHROID, LEVOTHROID) 200 MCG tablet Take 125 mcg by mouth daily before breakfast.       . metoprolol (LOPRESSOR) 50 MG tablet Take 1.5 tablets (75 mg total) by mouth 2 (two) times daily.  60 tablet  6  . ONETOUCH VERIO test strip       . spironolactone (ALDACTONE) 25 MG tablet Take 12.5 mg by mouth daily.        No current facility-administered medications for this visit.    Discontinued Meds:    Medications Discontinued During This Encounter  Medication Reason  . vitamin C (ASCORBIC ACID) 500 MG tablet Error  . ferrous fumarate (HEMOCYTE - 106 MG FE) 325 (106 FE) MG TABS tablet Error  . aspirin EC 325 MG tablet Error    Patient Active Problem List   Diagnosis Date Noted  . Wound infection after surgery 07/01/2013  . Chronic  diastolic heart failure 07/01/2013  . Cellulitis of right groin 07/01/2013  . Hematoma of groin 04/20/2013  . Pseudoaneurysm 04/20/2013  . Acute blood loss anemia 04/20/2013  . Hemorrhagic shock 04/20/2013  . Acute diastolic heart failure 04/15/2013  . Acute respiratory failure 04/14/2013  . Acute renal failure 04/14/2013  . Right ventricular hypertrophy 12/12/2012  . Bilateral lower extremity edema 03/28/2012  . Long term current use of anticoagulant 02/01/2011  . HYPERLIPIDEMIA 06/14/2010  . HYPOKALEMIA, MILD 04/02/2010  . DIABETES MELLITUS, TYPE II 05/05/2009  . Morbid obesity with BMI of 40.0-44.9, adult 05/05/2009  . HYPERTENSION 05/05/2009  . FIBRILLATION, ATRIAL 05/05/2009  . NEPHROLITHIASIS 05/05/2009  . UTI 05/05/2009  . THYROIDECTOMY, HX OF 05/05/2009    LABS    Component Value Date/Time   NA 139 12/25/2013 0801   NA 144 07/03/2013 0622   NA 142 07/02/2013 0612   K 4.0 12/25/2013 0801   K 3.9 07/03/2013 0622   K 3.6 07/02/2013 0612   CL 103 12/25/2013 0801   CL 106 07/03/2013 0622   CL 102 07/02/2013  0612   CO2 25 12/25/2013 0801   CO2 31 07/03/2013 0622   CO2 29 07/02/2013 0612   GLUCOSE 183* 12/25/2013 0801   GLUCOSE 124* 07/03/2013 0622   GLUCOSE 90 07/02/2013 0612   BUN 51* 12/25/2013 0801   BUN 32* 07/03/2013 0622   BUN 39* 07/02/2013 0612   CREATININE 1.37* 12/25/2013 0801   CREATININE 1.22* 07/03/2013 0622   CREATININE 1.30* 07/02/2013 0612   CREATININE 1.40* 07/01/2013 1540   CREATININE 1.35* 06/14/2013 1152   CREATININE 1.09 04/11/2012 0953   CALCIUM 9.5 12/25/2013 0801   CALCIUM 9.1 07/03/2013 0622   CALCIUM 9.2 07/02/2013 0612   GFRNONAA 46* 07/03/2013 0622   GFRNONAA 43* 07/02/2013 0612   GFRNONAA 39* 07/01/2013 1540   GFRAA 53* 07/03/2013 0622   GFRAA 50* 07/02/2013 0612   GFRAA 45* 07/01/2013 1540   CMP     Component Value Date/Time   NA 139 12/25/2013 0801   K 4.0 12/25/2013 0801   CL 103 12/25/2013 0801   CO2 25 12/25/2013 0801   GLUCOSE 183* 12/25/2013 0801   BUN 51* 12/25/2013 0801    CREATININE 1.37* 12/25/2013 0801   CREATININE 1.22* 07/03/2013 0622   CALCIUM 9.5 12/25/2013 0801   PROT 7.2 12/25/2013 0801   ALBUMIN 4.0 12/25/2013 0801   AST 27 12/25/2013 0801   ALT 47* 12/25/2013 0801   ALKPHOS 86 12/25/2013 0801   BILITOT 0.7 12/25/2013 0801   GFRNONAA 46* 07/03/2013 0622   GFRAA 53* 07/03/2013 0622       Component Value Date/Time   WBC 6.8 12/25/2013 0801   WBC 6.1 07/03/2013 0622   WBC 7.9 07/02/2013 0612   HGB 15.4* 12/25/2013 0801   HGB 12.6 07/03/2013 0622   HGB 13.2 07/02/2013 0612   HCT 45.8 12/25/2013 0801   HCT 42.2 07/03/2013 0622   HCT 43.2 07/02/2013 0612   MCV 87.2 12/25/2013 0801   MCV 94.2 07/03/2013 0622   MCV 94.3 07/02/2013 0612    Lipid Panel     Component Value Date/Time   CHOL 212* 07/26/2010 2028   TRIG 96 04/23/2013 0451   HDL 43 07/26/2010 2028   CHOLHDL 4.9 Ratio 07/26/2010 2028   VLDL 33 07/26/2010 2028   LDLCALC 136* 07/26/2010 2028    ABG    Component Value Date/Time   PHART 7.462* 04/24/2013 0855   PCO2ART 58.6* 04/24/2013 0855   PO2ART 77.7* 04/24/2013 0855   HCO3 41.3* 04/24/2013 0855   TCO2 37.0 04/24/2013 0855   O2SAT 95.8 04/24/2013 0855     Lab Results  Component Value Date   TSH 28.805* 07/01/2013   BNP (last 3 results)  Recent Labs  04/14/13 1030 04/26/13 0500 06/14/13 1152  PROBNP 12159.0* 7558.0* 24089.00*   Cardiac Panel (last 3 results) No results found for this basename: CKTOTAL, CKMB, TROPONINI, RELINDX,  in the last 72 hours  Iron/TIBC/Ferritin No results found for this basename: iron, tibc, ferritin     EKG Orders placed during the hospital encounter of 07/01/13  . EKG 12-LEAD  . EKG 12-LEAD  . EKG     Prior Assessment and Plan Problem List as of 03/05/2014     Cardiovascular and Mediastinum   HYPERTENSION   Last Assessment & Plan   07/15/2013 Office Visit Written 07/15/2013  2:46 PM by Jodelle Gross, NP     Excellent control of BP at this time. No to changes current medications.    FIBRILLATION, ATRIAL   Last Assessment  & Plan  06/12/2013 Office Visit Written 06/12/2013  2:20 PM by Jodelle GrossKathryn M Lawrence, NP     Heart rate is well controlled currently. She is without cardiac complaint. Her sister is very attentive and helps her with her medications. She is doing well. She is not currently on coumadin due to extensive right groin bleed in the setting of central line placement requiring multiple transfusions.  Will not place her on this now as she continues to have a wound with ongoing wound care.     Right ventricular hypertrophy   Last Assessment & Plan   07/15/2013 Office Visit Written 07/15/2013  2:46 PM by Jodelle GrossKathryn M Lawrence, NP     She has a history of RHF and CHF. She remains on spironolactone and lasix TID. [facility] of 1.22 on discharge. With wt loss of 17 lbs we may need to consider decreasing the lasix to BID on next visit as she continues to lose wt. Do not want to have issues with dehydration or syncope.  Follow up with Dr. Wyline MoodBranch in one month at which time we will make decision to adjust medications.    Acute diastolic heart failure   Last Assessment & Plan   07/15/2013 Office Visit Written 07/15/2013  2:47 PM by Jodelle GrossKathryn M Lawrence, NP     She appears well compensated at this time. No evidence of fluid overload or DOE. She is walking each day without need to stop or catch her breath.     Pseudoaneurysm   Chronic diastolic heart failure     Respiratory   Acute respiratory failure     Endocrine   DIABETES MELLITUS, TYPE II     Genitourinary   NEPHROLITHIASIS   UTI   Acute renal failure   Last Assessment & Plan   06/12/2013 Office Visit Written 06/12/2013  2:31 PM by Jodelle GrossKathryn M Lawrence, NP     CMET ordered.      Other   HYPERLIPIDEMIA   Last Assessment & Plan   12/12/2012 Office Visit Edited 12/12/2012  3:20 PM by Gaylord Shihhomas C Wall, MD     She says her lipids have been good. I do not have any recent values. With her diabetes, she probably should. Followup with  primary care with blood work before starting drug. Goal LDL certainly less than 100.    HYPOKALEMIA, MILD   Morbid obesity with BMI of 40.0-44.9, adult   Last Assessment & Plan   03/28/2012 Office Visit Written 03/28/2012  3:44 PM by Gaylord Shihhomas C Wall, MD     I strongly encouraged her to lose weight.     THYROIDECTOMY, HX OF   Long term current use of anticoagulant   Bilateral lower extremity edema   Last Assessment & Plan   12/12/2012 Office Visit Written 12/12/2012  3:17 PM by Gaylord Shihhomas C Wall, MD     Improved. No change in current medications.    Hematoma of groin   Last Assessment & Plan   06/12/2013 Office Visit Written 06/12/2013  2:31 PM by Jodelle GrossKathryn M Lawrence, NP     Dressing partially removed and wound found to be healthy. No bleeding or signs of infection. Lower right leg is sore to touch and with weight bearing. Continue to follow.    Acute blood loss anemia   Hemorrhagic shock   Wound infection after surgery   Cellulitis of right groin       Imaging: No results found.

## 2014-03-05 NOTE — Assessment & Plan Note (Signed)
Excellent control of BP no changes in her medications. She will follow up in 3 months with Dr.Branch.

## 2014-03-05 NOTE — Progress Notes (Signed)
HPI: [name] is a 63 year old patient of Dr. Wyline Mood we are following for ongoing assessment and management of hypertension, chronic atrial fibrillation, right-sided ventricular dysfunction with history of morbid obesity. The patient was last seen in the office in September of 2014. She had been hospitalized in August of 2014 for ventilator dependent respiratory failure in hemorrhagic shock. On last office visit the patient was continued on her medications, eating right and had no recurrent discomfort.    She was seen by Dr. Wyline Mood in December of 2014 was found to be stable from a cardiovascular standpoint. She was not restarted on anticoagulation therapy with a chads Vascor of 3 after having had recurrent bleeding with hematoma from a central line. There was a question whether the patient needed right heart catheterization for further evaluation in the setting of an abnormal echocardiogram revealed evidence of pulmonary hypertension and right-sided chamber enlargement of unclear etiology. The patient wishes to hold off on this for now.   She comes today feeling great. She has gotten dentures and is eating well and healthy. She is maintaining her wt. She denies chest pain or DOE. She is without complaint of rapid HR or near syncope.   Allergies  Allergen Reactions  . Penicillins Rash    Current Outpatient Prescriptions  Medication Sig Dispense Refill  . acetaminophen (TYLENOL) 500 MG tablet Take 500 mg by mouth every 6 (six) hours as needed for pain.      Marland Kitchen aspirin EC 81 MG tablet Take 81 mg by mouth daily.      . furosemide (LASIX) 40 MG tablet Take 20 mg by mouth 2 (two) times daily.       Marland Kitchen glimepiride (AMARYL) 2 MG tablet Take 1 mg by mouth daily before breakfast.       . levothyroxine (SYNTHROID, LEVOTHROID) 200 MCG tablet Take 125 mcg by mouth daily before breakfast.       . metoprolol (LOPRESSOR) 50 MG tablet Take 1.5 tablets (75 mg total) by mouth 2 (two) times daily.  60 tablet  6    . ONETOUCH VERIO test strip       . spironolactone (ALDACTONE) 25 MG tablet Take 12.5 mg by mouth daily.        No current facility-administered medications for this visit.    Past Medical History  Diagnosis Date  . Hypertension   . Type 2 diabetes mellitus     Type II  . Atrial fibrillation   . Nephrolithiasis   . Overweight   . UTI (urinary tract infection)   . Hypothyroidism (acquired)   . CHF (congestive heart failure)     Past Surgical History  Procedure Laterality Date  . Thyroidectomy    . Groin dissection Right 04/20/2013    Procedure: GROIN EXPLORATION;  Surgeon: Fabio Bering, MD;  Location: [facility];  Service: General;  Laterality: Right;    ROS:  Review of systems complete and found to be negative unless listed above  PHYSICAL EXAM BP 130/68  Pulse 95  Ht 5' (1.524 m)  Wt 168 lb (76.204 kg)  BMI 32.81 kg/m2 General: Well developed, well nourished, in no acute distress Head: Eyes PERRLA, No xanthomas.   Normal cephalic and atramatic  Lungs: Clear bilaterally to auscultation and percussion. Heart: HRIR S1 S2, without MRG.  Pulses are 2+ & equal.            No carotid bruit. No JVD.  No abdominal bruits. No femoral bruits. Abdomen: Bowel sounds  are positive, abdomen soft and non-tender without masses or                  Hernia's noted. Msk:  Back normal, normal gait. Normal strength and tone for age. Extremities: No clubbing, cyanosis or edema.  DP +1 Neuro: Alert and oriented X 3. Psych:  Good affect, responds appropriately  ASSESSMENT AND PLAN

## 2014-03-05 NOTE — Patient Instructions (Signed)
Your physician recommends that you schedule a follow-up appointment in:  3 months with Dr Lurena Joiner will receive a reminder letter two months in advance reminding you to call and schedule your appointment. If you don't receive this letter, please contact our office.  Please see coumadin nurse Misty Stanley to be established.

## 2014-03-06 ENCOUNTER — Ambulatory Visit (INDEPENDENT_AMBULATORY_CARE_PROVIDER_SITE_OTHER): Payer: BC Managed Care – PPO | Admitting: *Deleted

## 2014-03-06 ENCOUNTER — Ambulatory Visit (HOSPITAL_COMMUNITY)
Admission: RE | Admit: 2014-03-06 | Discharge: 2014-03-06 | Disposition: A | Discharge status: Home / Self Care | Payer: BC Managed Care – PPO | Source: Ambulatory Visit | Attending: Internal Medicine | Admitting: Internal Medicine

## 2014-03-06 ENCOUNTER — Other Ambulatory Visit (HOSPITAL_COMMUNITY): Payer: Self-pay | Admitting: Internal Medicine

## 2014-03-06 DIAGNOSIS — Z5181 Encounter for therapeutic drug level monitoring: Secondary | ICD-10-CM | POA: Insufficient documentation

## 2014-03-06 DIAGNOSIS — M25519 Pain in unspecified shoulder: Secondary | ICD-10-CM | POA: Insufficient documentation

## 2014-03-06 DIAGNOSIS — M25512 Pain in left shoulder: Secondary | ICD-10-CM

## 2014-03-06 DIAGNOSIS — I4891 Unspecified atrial fibrillation: Secondary | ICD-10-CM

## 2014-03-06 LAB — POCT INR: INR: 1

## 2014-03-06 MED ORDER — WARFARIN SODIUM 3 MG PO TABS: ORAL_TABLET | ORAL | Status: DC

## 2014-03-06 MED ORDER — METOPROLOL TARTRATE 50 MG PO TABS: 75.0000 mg | ORAL_TABLET | Freq: Two times a day (BID) | ORAL | Status: DC

## 2014-03-12 ENCOUNTER — Ambulatory Visit (INDEPENDENT_AMBULATORY_CARE_PROVIDER_SITE_OTHER): Payer: BC Managed Care – PPO | Admitting: *Deleted

## 2014-03-12 DIAGNOSIS — I4891 Unspecified atrial fibrillation: Secondary | ICD-10-CM

## 2014-03-12 DIAGNOSIS — Z5181 Encounter for therapeutic drug level monitoring: Secondary | ICD-10-CM

## 2014-03-12 LAB — POCT INR: INR: 1.2

## 2014-03-19 ENCOUNTER — Ambulatory Visit (INDEPENDENT_AMBULATORY_CARE_PROVIDER_SITE_OTHER): Payer: BC Managed Care – PPO | Admitting: *Deleted

## 2014-03-19 DIAGNOSIS — Z5181 Encounter for therapeutic drug level monitoring: Secondary | ICD-10-CM

## 2014-03-19 DIAGNOSIS — I4891 Unspecified atrial fibrillation: Secondary | ICD-10-CM

## 2014-03-19 LAB — POCT INR: INR: 1.5

## 2014-03-26 ENCOUNTER — Ambulatory Visit (INDEPENDENT_AMBULATORY_CARE_PROVIDER_SITE_OTHER): Payer: BC Managed Care – PPO | Admitting: *Deleted

## 2014-03-26 DIAGNOSIS — I4891 Unspecified atrial fibrillation: Secondary | ICD-10-CM

## 2014-03-26 DIAGNOSIS — Z5181 Encounter for therapeutic drug level monitoring: Secondary | ICD-10-CM

## 2014-03-26 LAB — POCT INR: INR: 2

## 2014-04-09 ENCOUNTER — Ambulatory Visit (INDEPENDENT_AMBULATORY_CARE_PROVIDER_SITE_OTHER): Payer: BC Managed Care – PPO | Admitting: *Deleted

## 2014-04-09 DIAGNOSIS — Z5181 Encounter for therapeutic drug level monitoring: Secondary | ICD-10-CM

## 2014-04-09 DIAGNOSIS — I4891 Unspecified atrial fibrillation: Secondary | ICD-10-CM

## 2014-04-09 LAB — POCT INR: INR: 1.9

## 2014-04-23 ENCOUNTER — Ambulatory Visit (INDEPENDENT_AMBULATORY_CARE_PROVIDER_SITE_OTHER): Payer: BC Managed Care – PPO | Admitting: *Deleted

## 2014-04-23 DIAGNOSIS — Z5181 Encounter for therapeutic drug level monitoring: Secondary | ICD-10-CM

## 2014-04-23 DIAGNOSIS — I4891 Unspecified atrial fibrillation: Secondary | ICD-10-CM

## 2014-04-23 LAB — POCT INR: INR: 1.8

## 2014-05-07 ENCOUNTER — Ambulatory Visit (INDEPENDENT_AMBULATORY_CARE_PROVIDER_SITE_OTHER): Payer: BC Managed Care – PPO | Admitting: *Deleted

## 2014-05-07 DIAGNOSIS — Z5181 Encounter for therapeutic drug level monitoring: Secondary | ICD-10-CM

## 2014-05-07 DIAGNOSIS — I4891 Unspecified atrial fibrillation: Secondary | ICD-10-CM

## 2014-05-07 LAB — POCT INR: INR: 2.8

## 2014-05-21 ENCOUNTER — Ambulatory Visit: Payer: BC Managed Care – PPO | Admitting: Cardiology

## 2014-05-28 ENCOUNTER — Ambulatory Visit (INDEPENDENT_AMBULATORY_CARE_PROVIDER_SITE_OTHER): Payer: BC Managed Care – PPO | Admitting: *Deleted

## 2014-05-28 DIAGNOSIS — I4891 Unspecified atrial fibrillation: Secondary | ICD-10-CM

## 2014-05-28 DIAGNOSIS — Z5181 Encounter for therapeutic drug level monitoring: Secondary | ICD-10-CM

## 2014-05-28 LAB — POCT INR: INR: 3.7

## 2014-06-11 ENCOUNTER — Ambulatory Visit (INDEPENDENT_AMBULATORY_CARE_PROVIDER_SITE_OTHER): Payer: BC Managed Care – PPO | Admitting: *Deleted

## 2014-06-11 DIAGNOSIS — I4891 Unspecified atrial fibrillation: Secondary | ICD-10-CM

## 2014-06-11 DIAGNOSIS — Z5181 Encounter for therapeutic drug level monitoring: Secondary | ICD-10-CM

## 2014-06-11 LAB — POCT INR: INR: 1.9

## 2014-07-01 ENCOUNTER — Encounter: Payer: Self-pay | Admitting: Cardiology

## 2014-07-01 ENCOUNTER — Ambulatory Visit (INDEPENDENT_AMBULATORY_CARE_PROVIDER_SITE_OTHER): Payer: BC Managed Care – PPO | Admitting: Cardiology

## 2014-07-01 VITALS — BP 140/98 | HR 75 | Ht 60.0 in | Wt 171.0 lb

## 2014-07-01 DIAGNOSIS — I4891 Unspecified atrial fibrillation: Secondary | ICD-10-CM

## 2014-07-01 DIAGNOSIS — I50810 Right heart failure, unspecified: Secondary | ICD-10-CM

## 2014-07-01 DIAGNOSIS — I509 Heart failure, unspecified: Secondary | ICD-10-CM

## 2014-07-01 NOTE — Patient Instructions (Addendum)
Your physician wants you to follow-up in: 6 months You will receive a reminder letter in the mail two months in advance. If you don't receive a letter, please call our office to schedule the follow-up appointment.     Your physician recommends that you continue on your current medications as directed. Please refer to the Current Medication list given to you today.      Thank you for choosing [facility] !        

## 2014-07-01 NOTE — Progress Notes (Signed)
Clinical Summary Amber Armstrong is a 63 y.o.female seen today for follow up of the following medical problems.   1. Afib  - denies any palpitations. No SOB or DOE.  - had been off blood thinner for some time after severe right groin hematoma during prior central line placement procedure last year, discussed with surgery and have since restarted her coumadin.   - she denies any palpitations  - she is back on coumadin, followed here. Denies any bleeding issues.   2. Chronic Right sided heart failure  - denies any DOE, no significant LE edema  - the etiology of her right sided heart failure is unclear from the available notes.  - No LE edema, no SOB or DOE though fairly sedentary lifestyle - no hx of tobacco use, no history of autoimmune disease. She has not wanted to have PFTs done  - discussed RHC last visit but patient did not want at that time, very hesitant due prior severe complication after central line placement with right groin pseudoaneurysm and hematoma  Past Medical History  Diagnosis Date  . Hypertension   . Type 2 diabetes mellitus     Type II  . Atrial fibrillation   . Nephrolithiasis   . Overweight   . UTI (urinary tract infection)   . Hypothyroidism (acquired)   . CHF (congestive heart failure)      Allergies  Allergen Reactions  . Penicillins Rash     Current Outpatient Prescriptions  Medication Sig Dispense Refill  . acetaminophen (TYLENOL) 500 MG tablet Take 500 mg by mouth every 6 (six) hours as needed for pain.      Marland Kitchen aspirin EC 81 MG tablet Take 81 mg by mouth daily.      . furosemide (LASIX) 40 MG tablet Take 20 mg by mouth 2 (two) times daily.       Marland Kitchen glimepiride (AMARYL) 2 MG tablet Take 1 mg by mouth daily before breakfast.       . levothyroxine (SYNTHROID, LEVOTHROID) 200 MCG tablet Take 125 mcg by mouth daily before breakfast.       . metoprolol (LOPRESSOR) 50 MG tablet Take 1.5 tablets (75 mg total) by mouth 2 (two) times daily.  90 tablet  6    . ONETOUCH VERIO test strip       . spironolactone (ALDACTONE) 25 MG tablet Take 12.5 mg by mouth daily.       Marland Kitchen warfarin (COUMADIN) 3 MG tablet Take 1 tablet daily or as directed  45 tablet  3   No current facility-administered medications for this visit.     Past Surgical History  Procedure Laterality Date  . Thyroidectomy    . Groin dissection Right 04/20/2013    Procedure: GROIN EXPLORATION;  Surgeon: Fabio Bering, MD;  Location: [facility];  Service: General;  Laterality: Right;     Allergies  Allergen Reactions  . Penicillins Rash      Family History  Problem Relation Age of Onset  . Arrhythmia Mother     Atrial fib     Social History [name] reports that she has never smoked. She has never used smokeless tobacco. [name] reports that she does not drink alcohol.   Review of Systems CONSTITUTIONAL: No weight loss, fever, chills, weakness or fatigue.  HEENT: Eyes: No visual loss, blurred vision, double vision or yellow sclerae.No hearing loss, sneezing, congestion, runny nose or sore throat.  SKIN: No rash or itching.  CARDIOVASCULAR: per HPI  RESPIRATORY: No shortness of breath, cough or sputum.  GASTROINTESTINAL: No anorexia, nausea, vomiting or diarrhea. No abdominal pain or blood.  GENITOURINARY: No burning on urination, no polyuria NEUROLOGICAL: No headache, dizziness, syncope, paralysis, ataxia, numbness or tingling in the extremities. No change in bowel or bladder control.  MUSCULOSKELETAL: No muscle, back pain, joint pain or stiffness.  LYMPHATICS: No enlarged nodes. No history of splenectomy.  PSYCHIATRIC: No history of depression or anxiety.  ENDOCRINOLOGIC: No reports of sweating, cold or heat intolerance. No polyuria or polydipsia.  Marland Kitchen   Physical Examination p 75 bp 140/98 Wt 171 lbs BMI 33 Gen: resting comfortably, no acute distress HEENT: no scleral icterus, pupils equal round and reactive, no palptable cervical adenopathy,  CV: RRR, no  m/r/g, no JVD, no carotid bruits Resp: Clear to auscultation bilaterally GI: abdomen is soft, non-tender, non-distended, normal bowel sounds, no hepatosplenomegaly MSK: extremities are warm, no edema.  Skin: warm, no rash Neuro:  no focal deficits Psych: appropriate affect   Diagnostic Studies  12/2013 Echo Study Conclusions  - Left ventricle: The cavity size was normal. Wall thickness was increased in a pattern of mild LVH. Systolic function was normal. The estimated ejection fraction was in the range of 60% to 65%. Wall motion was normal; there were no regional wall motion abnormalities. The study is not technically sufficient to allow evaluation of LV diastolic function. - Ventricular septum: The contour showed mild to moderatediastolic and systolic flattening. - Left atrium: The atrium was moderately dilated. - Right ventricle: The cavity size was severely dilated. Prominent trabeculation noted. Wall thickness was increased. Systolic function was low normal to mildly reduced. - Right atrium: The atrium was severely dilated. Central venous pressure: 36mm Hg (est). - Atrial septum: No defect or patent foramen ovale was identified. - Tricuspid valve: Mild regurgitation. - Pulmonic valve: Mild regurgitation. - Pulmonary arteries: Systolic pressure was moderately increased. PA peak pressure: 70mm Hg (S). - Pericardium, extracardiac: A prominent pericardial fat pad was present. Impressions:  - Mild LVH with LVEF 60-65%, indeterminate diastolic function. Moderate left atrial enlargement. Severe RV enlargement with increased wall thickness and prominent trabeculation, low normal to mildly reduced contraction. Compared to prior study, apical contraction has improved. Moderate pulmonary hypertension with PASP 52 mmHg.      Assessment and Plan  1. Afib  - denies any current symptoms  - complicated bleeding history as described above after femoral line placement with  prolonged healing, she had been off anticoagulation for sometime. Resumed after discussing with surgery, tolerating well.   2. Chronic right sided heart failure  - the etiology is unclear from the reviewed notes, there is mention of cor pulmonale but she does not have any documented pulmonary disease  -discussed in detail her failing right ventricle with her and her mother, and that further testing would help identify the cause and potentially reverse it. Ideally a RHC would help identify degree and potential etiology of her pulmonary HTN, however she does not want at this time - does not want PFTs either at this time.Another non-invasive test would be sleep testing for OSA if she ever agrees in the future.  - continue diuretics to help manage fluid status and symptoms     Antoine Poche, M.D., F.A.C.C.

## 2014-07-08 ENCOUNTER — Telehealth: Payer: Self-pay | Admitting: Cardiology

## 2014-07-08 MED ORDER — WARFARIN SODIUM 3 MG PO TABS: ORAL_TABLET | ORAL | Status: DC

## 2014-07-08 NOTE — Telephone Encounter (Signed)
Received fax refill request  Rx # [id number] Medication:  Coumadin 3 mg tab Qty 45 Sig:  Take one tablet by mouth once daily or as directed Physician:  Wyline Mood

## 2014-07-09 ENCOUNTER — Ambulatory Visit (INDEPENDENT_AMBULATORY_CARE_PROVIDER_SITE_OTHER): Payer: BC Managed Care – PPO | Admitting: *Deleted

## 2014-07-09 DIAGNOSIS — Z5181 Encounter for therapeutic drug level monitoring: Secondary | ICD-10-CM

## 2014-07-09 DIAGNOSIS — I4891 Unspecified atrial fibrillation: Secondary | ICD-10-CM

## 2014-07-09 LAB — POCT INR: INR: 2.3

## 2014-08-06 ENCOUNTER — Ambulatory Visit (INDEPENDENT_AMBULATORY_CARE_PROVIDER_SITE_OTHER): Payer: BC Managed Care – PPO | Admitting: *Deleted

## 2014-08-06 DIAGNOSIS — Z5181 Encounter for therapeutic drug level monitoring: Secondary | ICD-10-CM

## 2014-08-06 DIAGNOSIS — I4891 Unspecified atrial fibrillation: Secondary | ICD-10-CM

## 2014-08-06 LAB — POCT INR: INR: 1.8

## 2014-08-27 ENCOUNTER — Ambulatory Visit (INDEPENDENT_AMBULATORY_CARE_PROVIDER_SITE_OTHER): Payer: BC Managed Care – PPO | Admitting: *Deleted

## 2014-08-27 DIAGNOSIS — Z5181 Encounter for therapeutic drug level monitoring: Secondary | ICD-10-CM

## 2014-08-27 DIAGNOSIS — I4891 Unspecified atrial fibrillation: Secondary | ICD-10-CM

## 2014-08-27 LAB — POCT INR: INR: 1.9

## 2014-09-17 ENCOUNTER — Ambulatory Visit (INDEPENDENT_AMBULATORY_CARE_PROVIDER_SITE_OTHER): Payer: BC Managed Care – PPO | Admitting: *Deleted

## 2014-09-17 DIAGNOSIS — I4891 Unspecified atrial fibrillation: Secondary | ICD-10-CM

## 2014-09-17 DIAGNOSIS — Z5181 Encounter for therapeutic drug level monitoring: Secondary | ICD-10-CM

## 2014-09-17 LAB — POCT INR: INR: 2.4

## 2014-10-15 ENCOUNTER — Ambulatory Visit (INDEPENDENT_AMBULATORY_CARE_PROVIDER_SITE_OTHER): Payer: BC Managed Care – PPO | Admitting: *Deleted

## 2014-10-15 DIAGNOSIS — Z5181 Encounter for therapeutic drug level monitoring: Secondary | ICD-10-CM

## 2014-10-15 DIAGNOSIS — I4891 Unspecified atrial fibrillation: Secondary | ICD-10-CM

## 2014-10-15 LAB — POCT INR: INR: 3.1

## 2014-10-15 MED ORDER — METOPROLOL TARTRATE 50 MG PO TABS: 75.0000 mg | ORAL_TABLET | Freq: Two times a day (BID) | ORAL | Status: DC

## 2014-11-07 ENCOUNTER — Other Ambulatory Visit: Payer: Self-pay | Admitting: *Deleted

## 2014-11-10 MED ORDER — WARFARIN SODIUM 3 MG PO TABS: ORAL_TABLET | ORAL | Status: DC

## 2014-11-12 ENCOUNTER — Ambulatory Visit (INDEPENDENT_AMBULATORY_CARE_PROVIDER_SITE_OTHER): Payer: Medicare Other | Admitting: *Deleted

## 2014-11-12 DIAGNOSIS — I4891 Unspecified atrial fibrillation: Secondary | ICD-10-CM

## 2014-11-12 DIAGNOSIS — Z5181 Encounter for therapeutic drug level monitoring: Secondary | ICD-10-CM

## 2014-11-12 LAB — POCT INR: INR: 2.5

## 2014-12-10 ENCOUNTER — Ambulatory Visit (INDEPENDENT_AMBULATORY_CARE_PROVIDER_SITE_OTHER): Payer: Medicare Other | Admitting: *Deleted

## 2014-12-10 DIAGNOSIS — Z5181 Encounter for therapeutic drug level monitoring: Secondary | ICD-10-CM

## 2014-12-10 DIAGNOSIS — I4891 Unspecified atrial fibrillation: Secondary | ICD-10-CM

## 2014-12-10 LAB — POCT INR: INR: 1.8

## 2015-01-07 ENCOUNTER — Ambulatory Visit (INDEPENDENT_AMBULATORY_CARE_PROVIDER_SITE_OTHER): Payer: Medicare Other | Admitting: *Deleted

## 2015-01-07 DIAGNOSIS — I4891 Unspecified atrial fibrillation: Secondary | ICD-10-CM

## 2015-01-07 DIAGNOSIS — Z5181 Encounter for therapeutic drug level monitoring: Secondary | ICD-10-CM

## 2015-01-07 LAB — POCT INR: INR: 2.1

## 2015-02-11 ENCOUNTER — Ambulatory Visit (INDEPENDENT_AMBULATORY_CARE_PROVIDER_SITE_OTHER): Payer: Medicare HMO | Admitting: *Deleted

## 2015-02-11 DIAGNOSIS — I4891 Unspecified atrial fibrillation: Secondary | ICD-10-CM | POA: Diagnosis not present

## 2015-02-11 DIAGNOSIS — Z5181 Encounter for therapeutic drug level monitoring: Secondary | ICD-10-CM

## 2015-02-11 LAB — POCT INR: INR: 3.5

## 2015-02-11 MED ORDER — WARFARIN SODIUM 3 MG PO TABS: ORAL_TABLET | ORAL | Status: DC

## 2015-03-04 ENCOUNTER — Ambulatory Visit (INDEPENDENT_AMBULATORY_CARE_PROVIDER_SITE_OTHER): Payer: Medicare HMO | Admitting: *Deleted

## 2015-03-04 ENCOUNTER — Ambulatory Visit (INDEPENDENT_AMBULATORY_CARE_PROVIDER_SITE_OTHER): Payer: Medicare HMO | Admitting: Cardiology

## 2015-03-04 ENCOUNTER — Encounter: Payer: Self-pay | Admitting: Cardiology

## 2015-03-04 ENCOUNTER — Encounter: Payer: Self-pay | Admitting: *Deleted

## 2015-03-04 VITALS — BP 130/88 | HR 72 | Ht 60.0 in | Wt 170.0 lb

## 2015-03-04 DIAGNOSIS — I4891 Unspecified atrial fibrillation: Secondary | ICD-10-CM | POA: Diagnosis not present

## 2015-03-04 DIAGNOSIS — I519 Heart disease, unspecified: Secondary | ICD-10-CM

## 2015-03-04 DIAGNOSIS — I5031 Acute diastolic (congestive) heart failure: Secondary | ICD-10-CM

## 2015-03-04 DIAGNOSIS — Z5181 Encounter for therapeutic drug level monitoring: Secondary | ICD-10-CM | POA: Diagnosis not present

## 2015-03-04 DIAGNOSIS — I270 Primary pulmonary hypertension: Secondary | ICD-10-CM | POA: Diagnosis not present

## 2015-03-04 DIAGNOSIS — I272 Pulmonary hypertension, unspecified: Secondary | ICD-10-CM

## 2015-03-04 DIAGNOSIS — I5189 Other ill-defined heart diseases: Secondary | ICD-10-CM

## 2015-03-04 LAB — POCT INR: INR: 2.6

## 2015-03-04 NOTE — Progress Notes (Signed)
Clinical Summary Amber Armstrong is a 64 y.o.female seen today for follow up of the following medical problems.   1. Afib  - denies any palpitations. No SOB or DOE.  -  on coumadin, followed here. Denies any bleeding issues.    2. Chronic Right sided heart failure/Pulmonary HTN - denies any DOE, no significant LE edema - she has refused all workup for pulmonary HTN including RHC, PFTs, sleep study and VQ scan  - no hx of tobacco use, no history of autoimmune disease.     Past Medical History  Diagnosis Date  . Hypertension   . Type 2 diabetes mellitus     Type II  . Atrial fibrillation   . Nephrolithiasis   . Overweight(278.02)   . UTI (urinary tract infection)   . Hypothyroidism (acquired)   . CHF (congestive heart failure)      Allergies  Allergen Reactions  . Penicillins Rash     Current Outpatient Prescriptions  Medication Sig Dispense Refill  . acetaminophen (TYLENOL) 500 MG tablet Take 500 mg by mouth every 6 (six) hours as needed for pain.    Marland Kitchen aspirin EC 81 MG tablet Take 81 mg by mouth daily.    . cholecalciferol (VITAMIN D) 400 UNITS TABS tablet Take 400 Units by mouth daily.    . furosemide (LASIX) 40 MG tablet Take 20 mg by mouth 2 (two) times daily.     Marland Kitchen glimepiride (AMARYL) 2 MG tablet Take 1 mg by mouth daily before breakfast.     . levothyroxine (SYNTHROID, LEVOTHROID) 200 MCG tablet Take 125 mcg by mouth daily before breakfast.     . metoprolol (LOPRESSOR) 50 MG tablet Take 1.5 tablets (75 mg total) by mouth 2 (two) times daily. 90 tablet 6  . ONETOUCH VERIO test strip     . spironolactone (ALDACTONE) 25 MG tablet Take 12.5 mg by mouth daily.     Marland Kitchen warfarin (COUMADIN) 3 MG tablet Take 1 1/2 tablets daily or as directed 45 tablet 3   No current facility-administered medications for this visit.     Past Surgical History  Procedure Laterality Date  . Thyroidectomy    . Groin dissection Right 04/20/2013    Procedure: GROIN EXPLORATION;   Surgeon: Fabio Bering, MD;  Location: [facility];  Service: General;  Laterality: Right;     Allergies  Allergen Reactions  . Penicillins Rash      Family History  Problem Relation Age of Onset  . Arrhythmia Mother     Atrial fib     Social History Amber Armstrong reports that she has never smoked. She has never used smokeless tobacco. Amber Armstrong reports that she does not drink alcohol.   Review of Systems CONSTITUTIONAL: No weight loss, fever, chills, weakness or fatigue.  HEENT: Eyes: No visual loss, blurred vision, double vision or yellow sclerae.No hearing loss, sneezing, congestion, runny nose or sore throat.  SKIN: No rash or itching.  CARDIOVASCULAR: per HPI RESPIRATORY: No shortness of breath, cough or sputum.  GASTROINTESTINAL: No anorexia, nausea, vomiting or diarrhea. No abdominal pain or blood.  GENITOURINARY: No burning on urination, no polyuria NEUROLOGICAL: No headache, dizziness, syncope, paralysis, ataxia, numbness or tingling in the extremities. No change in bowel or bladder control.  MUSCULOSKELETAL: No muscle, back pain, joint pain or stiffness.  LYMPHATICS: No enlarged nodes. No history of splenectomy.  PSYCHIATRIC: No history of depression or anxiety.  ENDOCRINOLOGIC: No reports of sweating, cold or heat intolerance. No  polyuria or polydipsia.  Marland Kitchen   Physical Examination Filed Vitals:   03/04/15 1100  BP: 130/88  Pulse: 72   Filed Vitals:   03/04/15 1100  Height: 5' (1.524 m)  Weight: 170 lb (77.111 kg)    Gen: resting comfortably, no acute distress HEENT: no scleral icterus, pupils equal round and reactive, no palptable cervical adenopathy,  CV: RRR, no m/r/g, no JVD, no carotid bruits Resp: Clear to auscultation bilaterally GI: abdomen is soft, non-tender, non-distended, normal bowel sounds, no hepatosplenomegaly MSK: extremities are warm, no edema.  Skin: warm, no rash Neuro:  no focal deficits Psych: appropriate affect   Diagnostic  Studies  12/2013 Echo Study Conclusions  - Left ventricle: The cavity size was normal. Wall thickness was increased in a pattern of mild LVH. Systolic function was normal. The estimated ejection fraction was in the range of 60% to 65%. Wall motion was normal; there were no regional wall motion abnormalities. The study is not technically sufficient to allow evaluation of LV diastolic function. - Ventricular septum: The contour showed mild to moderatediastolic and systolic flattening. - Left atrium: The atrium was moderately dilated. - Right ventricle: The cavity size was severely dilated. Prominent trabeculation noted. Wall thickness was increased. Systolic function was low normal to mildly reduced. - Right atrium: The atrium was severely dilated. Central venous pressure: 3mm Hg (est). - Atrial septum: No defect or patent foramen ovale was identified. - Tricuspid valve: Mild regurgitation. - Pulmonic valve: Mild regurgitation. - Pulmonary arteries: Systolic pressure was moderately increased. PA peak pressure: 52mm Hg (S). - Pericardium, extracardiac: A prominent pericardial fat pad was present. Impressions:  - Mild LVH with LVEF 60-65%, indeterminate diastolic function. Moderate left atrial enlargement. Severe RV enlargement with increased wall thickness and prominent trabeculation, low normal to mildly reduced contraction. Compared to prior study, apical contraction has improved. Moderate pulmonary hypertension with PASP 52 mmHg.       Assessment and Plan  1. Afib  - denies any current symptoms, continue current meds  2. Chronic right sided heart failure/Pulmonary HTN - the etiology is unclear from the reviewed notes, there is mention of cor pulmonale but she does not have any documented pulmonary disease  -discussed in detail again today with her and her mother her failing right ventricle and that further testing is warranted. Discussed the poor outcomes of RV  failure including worsening edema, SOB, and increased mortality. They continue to refuse, even noninvasive testing such as PFTs or a VQ scan.  - continue diuretics to help manage fluid status and symptoms - readress next visit     Antoine Poche, M.D.

## 2015-03-04 NOTE — Patient Instructions (Signed)

## 2015-03-17 ENCOUNTER — Observation Stay (HOSPITAL_COMMUNITY)
Admission: EM | Admit: 2015-03-17 | Discharge: 2015-03-18 | Disposition: A | Discharge status: Home / Self Care | Payer: Medicare HMO | Attending: Internal Medicine | Admitting: Internal Medicine

## 2015-03-17 ENCOUNTER — Encounter (HOSPITAL_COMMUNITY): Payer: Self-pay | Admitting: Cardiology

## 2015-03-17 DIAGNOSIS — Z880 Allergy status to penicillin: Secondary | ICD-10-CM | POA: Insufficient documentation

## 2015-03-17 DIAGNOSIS — Z7982 Long term (current) use of aspirin: Secondary | ICD-10-CM | POA: Insufficient documentation

## 2015-03-17 DIAGNOSIS — I5032 Chronic diastolic (congestive) heart failure: Secondary | ICD-10-CM | POA: Diagnosis not present

## 2015-03-17 DIAGNOSIS — E1165 Type 2 diabetes mellitus with hyperglycemia: Principal | ICD-10-CM | POA: Insufficient documentation

## 2015-03-17 DIAGNOSIS — R739 Hyperglycemia, unspecified: Secondary | ICD-10-CM

## 2015-03-17 DIAGNOSIS — R531 Weakness: Secondary | ICD-10-CM | POA: Diagnosis not present

## 2015-03-17 DIAGNOSIS — E860 Dehydration: Secondary | ICD-10-CM | POA: Insufficient documentation

## 2015-03-17 DIAGNOSIS — I4891 Unspecified atrial fibrillation: Secondary | ICD-10-CM | POA: Diagnosis not present

## 2015-03-17 DIAGNOSIS — IMO0002 Reserved for concepts with insufficient information to code with codable children: Secondary | ICD-10-CM

## 2015-03-17 DIAGNOSIS — N390 Urinary tract infection, site not specified: Secondary | ICD-10-CM | POA: Diagnosis not present

## 2015-03-17 DIAGNOSIS — I517 Cardiomegaly: Secondary | ICD-10-CM | POA: Diagnosis present

## 2015-03-17 DIAGNOSIS — E039 Hypothyroidism, unspecified: Secondary | ICD-10-CM | POA: Diagnosis not present

## 2015-03-17 DIAGNOSIS — Z7901 Long term (current) use of anticoagulants: Secondary | ICD-10-CM | POA: Diagnosis not present

## 2015-03-17 DIAGNOSIS — I509 Heart failure, unspecified: Secondary | ICD-10-CM | POA: Insufficient documentation

## 2015-03-17 DIAGNOSIS — I482 Chronic atrial fibrillation, unspecified: Secondary | ICD-10-CM | POA: Diagnosis present

## 2015-03-17 DIAGNOSIS — R358 Other polyuria: Secondary | ICD-10-CM | POA: Diagnosis not present

## 2015-03-17 DIAGNOSIS — E663 Overweight: Secondary | ICD-10-CM | POA: Insufficient documentation

## 2015-03-17 DIAGNOSIS — N183 Chronic kidney disease, stage 3 (moderate): Secondary | ICD-10-CM

## 2015-03-17 DIAGNOSIS — E871 Hypo-osmolality and hyponatremia: Secondary | ICD-10-CM | POA: Diagnosis present

## 2015-03-17 DIAGNOSIS — R5383 Other fatigue: Secondary | ICD-10-CM | POA: Insufficient documentation

## 2015-03-17 DIAGNOSIS — N200 Calculus of kidney: Secondary | ICD-10-CM | POA: Insufficient documentation

## 2015-03-17 DIAGNOSIS — E875 Hyperkalemia: Secondary | ICD-10-CM | POA: Diagnosis not present

## 2015-03-17 DIAGNOSIS — Z6841 Body Mass Index (BMI) 40.0 and over, adult: Secondary | ICD-10-CM

## 2015-03-17 DIAGNOSIS — I10 Essential (primary) hypertension: Secondary | ICD-10-CM | POA: Diagnosis present

## 2015-03-17 DIAGNOSIS — N179 Acute kidney failure, unspecified: Secondary | ICD-10-CM | POA: Diagnosis present

## 2015-03-17 DIAGNOSIS — R631 Polydipsia: Secondary | ICD-10-CM | POA: Diagnosis not present

## 2015-03-17 DIAGNOSIS — E1122 Type 2 diabetes mellitus with diabetic chronic kidney disease: Secondary | ICD-10-CM

## 2015-03-17 HISTORY — DX: Chronic diastolic (congestive) heart failure: I50.32

## 2015-03-17 HISTORY — DX: Hyperglycemia, unspecified: R73.9

## 2015-03-17 HISTORY — DX: Cardiomegaly: I51.7

## 2015-03-17 LAB — URINE MICROSCOPIC-ADD ON

## 2015-03-17 LAB — URINALYSIS, ROUTINE W REFLEX MICROSCOPIC
BILIRUBIN URINE: NEGATIVE
Glucose, UA: >1000 mg/dL — AB
NITRITE: NEGATIVE
Specific Gravity, Urine: 1.02 (ref 1.005–1.030)
UROBILINOGEN UA: 0.2 mg/dL (ref 0.0–1.0)
pH: 5 (ref 5.0–8.0)

## 2015-03-17 LAB — CBC WITH DIFFERENTIAL/PLATELET
Basophils Absolute: 0 10*3/uL (ref 0.0–0.1)
Basophils Relative: 0 % (ref 0–1)
EOS ABS: 0.1 10*3/uL (ref 0.0–0.7)
Eosinophils Relative: 1 % (ref 0–5)
HEMATOCRIT: 45.9 % (ref 36.0–46.0)
Hemoglobin: 16.2 g/dL — ABNORMAL HIGH (ref 12.0–15.0)
LYMPHS ABS: 0.9 10*3/uL (ref 0.7–4.0)
Lymphocytes Relative: 9 % — ABNORMAL LOW (ref 12–46)
MCH: 29.2 pg (ref 26.0–34.0)
MCHC: 35.3 g/dL (ref 30.0–36.0)
MCV: 82.7 fL (ref 78.0–100.0)
Monocytes Absolute: 1.4 10*3/uL — ABNORMAL HIGH (ref 0.1–1.0)
Monocytes Relative: 14 % — ABNORMAL HIGH (ref 3–12)
Neutro Abs: 7.1 10*3/uL (ref 1.7–7.7)
Neutrophils Relative %: 76 % (ref 43–77)
Platelets: 214 10*3/uL (ref 150–400)
RBC: 5.55 MIL/uL — AB (ref 3.87–5.11)
RDW: 13.5 % (ref 11.5–15.5)
WBC: 9.4 10*3/uL (ref 4.0–10.5)

## 2015-03-17 LAB — BLOOD GAS, VENOUS
Acid-base deficit: 6 mmol/L — ABNORMAL HIGH (ref 0.0–2.0)
BICARBONATE: 19.8 meq/L — AB (ref 20.0–24.0)
O2 CONTENT: 2 L/min
O2 Saturation: 59.6 %
PATIENT TEMPERATURE: 37
PH VEN: 7.265 (ref 7.250–7.300)
PO2 VEN: 32.6 mmHg (ref 30.0–45.0)
TCO2: 17.6 mmol/L (ref 0–100)
pCO2, Ven: 45.1 mmHg (ref 45.0–50.0)

## 2015-03-17 LAB — PROTIME-INR
INR: 2.93 — AB (ref 0.00–1.49)
PROTHROMBIN TIME: 30.1 s — AB (ref 11.6–15.2)

## 2015-03-17 LAB — BASIC METABOLIC PANEL
Anion gap: 12 (ref 5–15)
BUN: 82 mg/dL — ABNORMAL HIGH (ref 6–20)
CALCIUM: 9.3 mg/dL (ref 8.9–10.3)
CHLORIDE: 98 mmol/L — AB (ref 101–111)
CO2: 21 mmol/L — AB (ref 22–32)
CREATININE: 2.1 mg/dL — AB (ref 0.44–1.00)
GFR calc non Af Amer: 24 mL/min — ABNORMAL LOW (ref >60)
GFR, EST AFRICAN AMERICAN: 27 mL/min — AB (ref >60)
GLUCOSE: 488 mg/dL — AB (ref 65–99)
Potassium: 5.2 mmol/L — ABNORMAL HIGH (ref 3.5–5.1)
SODIUM: 131 mmol/L — AB (ref 135–145)

## 2015-03-17 LAB — CBG MONITORING, ED
GLUCOSE-CAPILLARY: 313 mg/dL — AB (ref 65–99)
Glucose-Capillary: 490 mg/dL — ABNORMAL HIGH (ref 65–99)
Glucose-Capillary: 415 mg/dL — ABNORMAL HIGH (ref 65–99)

## 2015-03-17 LAB — GLUCOSE, CAPILLARY
GLUCOSE-CAPILLARY: 263 mg/dL — AB (ref 65–99)
Glucose-Capillary: 296 mg/dL — ABNORMAL HIGH (ref 65–99)

## 2015-03-17 LAB — MAGNESIUM: Magnesium: 2.5 mg/dL — ABNORMAL HIGH (ref 1.7–2.4)

## 2015-03-17 LAB — TSH: TSH: 0.856 u[IU]/mL (ref 0.350–4.500)

## 2015-03-17 LAB — LACTIC ACID, PLASMA: LACTIC ACID, VENOUS: 2 mmol/L (ref 0.5–2.0)

## 2015-03-17 MED ORDER — DEXTROSE 5 % IV SOLN
1.0000 g | Freq: Once | INTRAVENOUS | Status: AC
  Administered 2015-03-17: 1 g via INTRAVENOUS
  Filled 2015-03-17: qty 10

## 2015-03-17 MED ORDER — CEFTRIAXONE SODIUM IN DEXTROSE 20 MG/ML IV SOLN
1.0000 g | INTRAVENOUS | Status: DC
  Filled 2015-03-17: qty 50

## 2015-03-17 MED ORDER — ALUM & MAG HYDROXIDE-SIMETH 200-200-20 MG/5ML PO SUSP: 30.0000 mL | Freq: Four times a day (QID) | ORAL | Status: DC | PRN

## 2015-03-17 MED ORDER — SODIUM CHLORIDE 0.9 % IV BOLUS (SEPSIS)
1000.0000 mL | Freq: Once | INTRAVENOUS | Status: AC
  Administered 2015-03-17: 1000 mL via INTRAVENOUS

## 2015-03-17 MED ORDER — ONDANSETRON HCL 4 MG PO TABS: 4.0000 mg | ORAL_TABLET | Freq: Four times a day (QID) | ORAL | Status: DC | PRN

## 2015-03-17 MED ORDER — DEXTROSE 5 % IV SOLN
1.0000 g | INTRAVENOUS | Status: DC
  Administered 2015-03-18: 1 g via INTRAVENOUS
  Filled 2015-03-17 (×2): qty 10

## 2015-03-17 MED ORDER — SENNOSIDES-DOCUSATE SODIUM 8.6-50 MG PO TABS: 1.0000 | ORAL_TABLET | Freq: Every evening | ORAL | Status: DC | PRN

## 2015-03-17 MED ORDER — BISACODYL 10 MG RE SUPP: 10.0000 mg | Freq: Every day | RECTAL | Status: DC | PRN

## 2015-03-17 MED ORDER — ACETAMINOPHEN 325 MG PO TABS
650.0000 mg | ORAL_TABLET | Freq: Four times a day (QID) | ORAL | Status: DC | PRN
Start: 2015-03-17 — End: 2015-03-18

## 2015-03-17 MED ORDER — LEVOTHYROXINE SODIUM 25 MCG PO TABS
125.0000 ug | ORAL_TABLET | Freq: Every day | ORAL | Status: DC
  Administered 2015-03-18: 125 ug via ORAL
  Filled 2015-03-17 (×2): qty 1

## 2015-03-17 MED ORDER — HYDROCODONE-ACETAMINOPHEN 5-325 MG PO TABS: 1.0000 | ORAL_TABLET | ORAL | Status: DC | PRN

## 2015-03-17 MED ORDER — INSULIN ASPART 100 UNIT/ML ~~LOC~~ SOLN
SUBCUTANEOUS | Status: AC
  Filled 2015-03-17: qty 1

## 2015-03-17 MED ORDER — VANCOMYCIN HCL IN DEXTROSE 1-5 GM/200ML-% IV SOLN: 1000.0000 mg | INTRAVENOUS | Status: DC

## 2015-03-17 MED ORDER — ACETAMINOPHEN 650 MG RE SUPP: 650.0000 mg | Freq: Four times a day (QID) | RECTAL | Status: DC | PRN

## 2015-03-17 MED ORDER — INSULIN ASPART 100 UNIT/ML ~~LOC~~ SOLN
0.0000 [IU] | Freq: Every day | SUBCUTANEOUS | Status: DC
  Administered 2015-03-17: 3 [IU] via SUBCUTANEOUS

## 2015-03-17 MED ORDER — SODIUM CHLORIDE 0.9 % IV SOLN
INTRAVENOUS | Status: AC
  Administered 2015-03-17 – 2015-03-18 (×2): via INTRAVENOUS

## 2015-03-17 MED ORDER — ONDANSETRON HCL 4 MG/2ML IJ SOLN: 4.0000 mg | Freq: Four times a day (QID) | INTRAMUSCULAR | Status: DC | PRN

## 2015-03-17 MED ORDER — TRAZODONE HCL 50 MG PO TABS
25.0000 mg | ORAL_TABLET | Freq: Every evening | ORAL | Status: DC | PRN
Start: 2015-03-17 — End: 2015-03-18

## 2015-03-17 MED ORDER — INSULIN ASPART 100 UNIT/ML ~~LOC~~ SOLN
0.0000 [IU] | Freq: Three times a day (TID) | SUBCUTANEOUS | Status: DC
  Administered 2015-03-17 – 2015-03-18 (×2): 8 [IU] via SUBCUTANEOUS
  Administered 2015-03-18: 15 [IU] via SUBCUTANEOUS
  Administered 2015-03-18: 8 [IU] via SUBCUTANEOUS

## 2015-03-17 MED ORDER — WARFARIN - PHARMACIST DOSING INPATIENT
Status: DC
  Administered 2015-03-17: 16:00:00

## 2015-03-17 MED ORDER — SODIUM CHLORIDE 0.9 % IV SOLN: Freq: Once | INTRAVENOUS | Status: DC

## 2015-03-17 MED ORDER — INSULIN ASPART 100 UNIT/ML IV SOLN
5.0000 [IU] | Freq: Once | INTRAVENOUS | Status: AC
  Administered 2015-03-17: 5 [IU] via INTRAVENOUS

## 2015-03-17 MED ORDER — METOPROLOL TARTRATE 50 MG PO TABS
75.0000 mg | ORAL_TABLET | Freq: Two times a day (BID) | ORAL | Status: DC
  Administered 2015-03-17 – 2015-03-18 (×3): 75 mg via ORAL
  Filled 2015-03-17 (×6): qty 1

## 2015-03-17 MED ORDER — WARFARIN SODIUM 2.5 MG PO TABS
4.5000 mg | ORAL_TABLET | Freq: Once | ORAL | Status: AC
  Administered 2015-03-17: 4.5 mg via ORAL
  Filled 2015-03-17 (×2): qty 1

## 2015-03-17 MED ORDER — MORPHINE SULFATE 2 MG/ML IJ SOLN: 1.0000 mg | INTRAMUSCULAR | Status: DC | PRN

## 2015-03-17 MED ORDER — ASPIRIN EC 81 MG PO TBEC
81.0000 mg | DELAYED_RELEASE_TABLET | Freq: Every day | ORAL | Status: DC
  Administered 2015-03-17 – 2015-03-18 (×2): 81 mg via ORAL
  Filled 2015-03-17 (×2): qty 1

## 2015-03-17 MED ORDER — SODIUM CHLORIDE 0.9 % IV SOLN
INTRAVENOUS | Status: AC
  Administered 2015-03-17: 13:00:00 via INTRAVENOUS

## 2015-03-17 MED ORDER — MAGNESIUM CITRATE PO SOLN: 1.0000 | Freq: Once | ORAL | Status: AC | PRN

## 2015-03-17 NOTE — ED Provider Notes (Signed)
CSN: [id number]     Arrival date & time 03/17/15  1003 History   This chart was scribed for Blane Ohara, MD by Leona Carry, ED Scribe. The patient was seen in [facility]. The patient's care was started at 10:37 AM.   Chief Complaint  Patient presents with  . Hyperglycemia   Patient is a 64 y.o. female presenting with hyperglycemia. The history is provided by the patient. No language interpreter was used.  Hyperglycemia Associated symptoms: fatigue, increased thirst, polyuria and weakness   Associated symptoms: no abdominal pain, no chest pain, no dysuria, no fever, no shortness of breath and no vomiting    HPI Comments: MATILYNN DACEY is a 64 y.o. female with a history of type 2 DM who presents to the Emergency Department complaining of hyperglycemia. Patient visited her PCP yesterday and her blood sugar was measured at approximately 600. PCP instructed her to come to the ED. Patient reports that her blood sugar levels are normally 120-140. She states that she is very thirsty at this time. She denies, HA, visual changes, neck pain, sore throat, leg swelling, abdominal pain, or back pain.  Patient reports that she takes Amaryl. She does not take insulin.  PCP is Dr. Karilyn Cota.  Past Medical History  Diagnosis Date  . Hypertension   . Type 2 diabetes mellitus     Type II  . Atrial fibrillation   . Nephrolithiasis   . Overweight(278.02)   . UTI (urinary tract infection)   . Hypothyroidism (acquired)   . CHF (congestive heart failure)    Past Surgical History  Procedure Laterality Date  . Thyroidectomy    . Groin dissection Right 04/20/2013    Procedure: GROIN EXPLORATION;  Surgeon: Fabio Bering, MD;  Location: [facility];  Service: General;  Laterality: Right;   Family History  Problem Relation Age of Onset  . Arrhythmia Mother     Atrial fib   History  Substance Use Topics  . Smoking status: Never Smoker   . Smokeless tobacco: Never Used  . Alcohol Use: No   OB  History    No data available     Review of Systems  Constitutional: Positive for fatigue. Negative for fever and chills.  HENT: Negative for congestion and sore throat.   Eyes: Negative for visual disturbance.  Respiratory: Negative for shortness of breath.   Cardiovascular: Negative for chest pain and leg swelling.  Gastrointestinal: Negative for vomiting, abdominal pain and anal bleeding.  Endocrine: Positive for polydipsia and polyuria.  Genitourinary: Negative for dysuria and flank pain.  Musculoskeletal: Negative for back pain, neck pain and neck stiffness.  Skin: Negative for rash.  Neurological: Positive for weakness. Negative for light-headedness and headaches.      Allergies  Penicillins  Home Medications   Prior to Admission medications   Medication Sig Start Date End Date Taking? Authorizing Provider  acetaminophen (TYLENOL) 500 MG tablet Take 500 mg by mouth every 6 (six) hours as needed for pain.   Yes Historical Provider, MD  aspirin EC 81 MG tablet Take 81 mg by mouth daily.   Yes Historical Provider, MD  furosemide (LASIX) 40 MG tablet Take 20 mg by mouth 2 (two) times daily.  06/17/13  Yes Jodelle Gross, NP  glimepiride (AMARYL) 2 MG tablet Take 1 mg by mouth daily before breakfast.    Yes Historical Provider, MD  levothyroxine (SYNTHROID, LEVOTHROID) 200 MCG tablet Take 125 mcg by mouth daily before breakfast.    Yes  Historical Provider, MD  metoprolol (LOPRESSOR) 50 MG tablet Take 1.5 tablets (75 mg total) by mouth 2 (two) times daily. 10/15/14  Yes Antoine Poche, MD  [facility] VERIO test strip  10/05/13  Yes Historical Provider, MD  spironolactone (ALDACTONE) 25 MG tablet Take 12.5 mg by mouth daily.  06/17/13  Yes Jodelle Gross, NP  warfarin (COUMADIN) 3 MG tablet Take 1 1/2 tablets daily or as directed 02/11/15  Yes Antoine Poche, MD   Triage Vitals: BP 143/87 mmHg  Pulse 106  Temp(Src) 97.5 F (36.4 C) (Oral)  Resp 16  Ht 4\' 11"  (1.499 m)   Wt 167 lb (75.751 kg)  BMI 33.71 kg/m2  SpO2 100% Physical Exam  Constitutional: She is oriented to person, place, and time. She appears well-developed and well-nourished. No distress.  Overall well-appearing.  HENT:  Head: Normocephalic and atraumatic.  Mouth/Throat: Mucous membranes are dry.  Eyes: Conjunctivae and EOM are normal.  Neck: Neck supple. No tracheal deviation present.  Cardiovascular: Normal rate, regular rhythm and normal heart sounds.   Pulmonary/Chest: Effort normal and breath sounds normal. No respiratory distress. She has no wheezes. She has no rales.  Abdominal: There is no tenderness.  Genitourinary:  No groin infection.  Musculoskeletal: Normal range of motion. She exhibits no edema.  No leg swelling. DP pulse good.  Neurological: She is alert and oriented to person, place, and time.  Skin: Skin is warm and dry.  Psychiatric: She has a normal mood and affect. Her behavior is normal.  Nursing note and vitals reviewed.   ED Course  Procedures (including critical care time) DIAGNOSTIC STUDIES: Oxygen Saturation is 100% on room air, normal by my interpretation.    COORDINATION OF CARE:    Labs Review Labs Reviewed  BASIC METABOLIC PANEL - Abnormal; Notable for the following:    Sodium 131 (*)    Potassium 5.2 (*)    Chloride 98 (*)    CO2 21 (*)    Glucose, Bld 488 (*)    BUN 82 (*)    Creatinine, Ser 2.10 (*)    GFR calc non Af Amer 24 (*)    GFR calc Af Amer 27 (*)    All other components within normal limits  CBC WITH DIFFERENTIAL/PLATELET - Abnormal; Notable for the following:    RBC 5.55 (*)    Hemoglobin 16.2 (*)    Lymphocytes Relative 9 (*)    Monocytes Relative 14 (*)    Monocytes Absolute 1.4 (*)    All other components within normal limits  URINALYSIS, ROUTINE W REFLEX MICROSCOPIC - Abnormal; Notable for the following:    APPearance HAZY (*)    Glucose, UA >1000 (*)    Hgb urine dipstick MODERATE (*)    Ketones, ur TRACE (*)     Protein, ur TRACE (*)    Leukocytes, UA SMALL (*)    All other components within normal limits  BLOOD GAS, VENOUS - Abnormal; Notable for the following:    Bicarbonate 19.8 (*)    Acid-base deficit 6.0 (*)    All other components within normal limits  PROTIME-INR - Abnormal; Notable for the following:    Prothrombin Time 30.1 (*)    INR 2.93 (*)    All other components within normal limits  URINE MICROSCOPIC-ADD ON - Abnormal; Notable for the following:    Bacteria, UA MANY (*)    All other components within normal limits  CBG MONITORING, ED - Abnormal; Notable for the following:  Glucose-Capillary 490 (*)    All other components within normal limits  CBG MONITORING, ED - Abnormal; Notable for the following:    Glucose-Capillary 415 (*)    All other components within normal limits  URINE CULTURE  LACTIC ACID, PLASMA    Imaging Review No results found.   EKG Interpretation None      MDM   Final diagnoses:  Hyperglycemia due to type 2 diabetes mellitus  UTI (lower urinary tract infection)  Dehydration  Hyperkalemia   I personally performed the services described in this documentation, which was scribed in my presence. The recorded information has been reviewed and is accurate.  Patient presents with uncontrolled diabetes likely secondary to urine infection. Dehydrated clinically IV fluid bolus given. Insulin dose given in ER. Patient overall well-appearing.  Discussed admission with the hospitalist.  The patients results and plan were reviewed and discussed.   Any x-rays performed were personally reviewed by myself.   Differential diagnosis were considered with the presenting HPI.  Medications  cefTRIAXone (ROCEPHIN) 1 g in dextrose 5 % 50 mL IVPB (not administered)  0.9 %  sodium chloride infusion (not administered)  insulin aspart (novoLOG) injection 5 Units (not administered)  0.9 %  sodium chloride infusion (not administered)  sodium chloride 0.9 % bolus  1,000 mL (0 mLs Intravenous Stopped 03/17/15 1156)    Filed Vitals:   03/17/15 1130 03/17/15 1145 03/17/15 1200 03/17/15 1230  BP: 156/92  144/80   Pulse: 96 84 91 92  Temp:      TempSrc:      Resp: 18     Height:      Weight:      SpO2: 100% 100% 100% 100%    Final diagnoses:  Hyperglycemia due to type 2 diabetes mellitus  UTI (lower urinary tract infection)  Dehydration  Hyperkalemia    Admission/ observation were discussed with the admitting physician, patient and/or family and they are comfortable with the plan.      Blane Ohara, MD 03/17/15 1322

## 2015-03-17 NOTE — Progress Notes (Signed)
ANTICOAGULATION CONSULT NOTE - Initial Consult  Pharmacy Consult for Coumadin (chronic Rx PTA) Indication: atrial fibrillation  Allergies  Allergen Reactions  . Penicillins Rash   Patient Measurements: Height: 4\' 11"  (149.9 cm) Weight: 167 lb (75.751 kg) IBW/kg (Calculated) : 43.2  Vital Signs: Temp: 97.9 F (36.6 C) (05/24 1439) Temp Source: Oral (05/24 1439) BP: 150/82 mmHg (05/24 1439) Pulse Rate: 86 (05/24 1439)  Labs:  Recent Labs  03/17/15 1038 03/17/15 1100  HGB 16.2*  --   HCT 45.9  --   PLT 214  --   LABPROT  --  30.1*  INR  --  2.93*  CREATININE 2.10*  --    Estimated Creatinine Clearance: 23.7 mL/min (by C-G formula based on Cr of 2.1).  Medical History: Past Medical History  Diagnosis Date  . Hypertension   . Type 2 diabetes mellitus     Type II  . Atrial fibrillation   . Nephrolithiasis   . Overweight(278.02)   . UTI (urinary tract infection)   . Hypothyroidism (acquired)   . CHF (congestive heart failure)     chronic diastolic   Medications:  Prescriptions prior to admission  Medication Sig Dispense Refill Last Dose  . acetaminophen (TYLENOL) 500 MG tablet Take 500 mg by mouth every 6 (six) hours as needed for pain.   Past Month at Unknown time  . aspirin EC 81 MG tablet Take 81 mg by mouth daily.   03/16/2015 at Unknown time  . furosemide (LASIX) 40 MG tablet Take 20 mg by mouth 2 (two) times daily.    03/16/2015 at Unknown time  . glimepiride (AMARYL) 2 MG tablet Take 1 mg by mouth daily before breakfast.    03/16/2015 at Unknown time  . levothyroxine (SYNTHROID, LEVOTHROID) 200 MCG tablet Take 125 mcg by mouth daily before breakfast.    03/16/2015 at Unknown time  . metoprolol (LOPRESSOR) 50 MG tablet Take 1.5 tablets (75 mg total) by mouth 2 (two) times daily. 90 tablet 6 03/16/2015 at 2000  . ONETOUCH VERIO test strip    03/16/2015 at Unknown time  . spironolactone (ALDACTONE) 25 MG tablet Take 12.5 mg by mouth daily.    03/16/2015 at Unknown  time  . warfarin (COUMADIN) 3 MG tablet Take 1 1/2 tablets daily or as directed 45 tablet 3 03/16/2015 at 2000   Assessment: 64yo female on chronic Coumadin PTA for h/o afib.  INR is therapeutic on admission.  Home dose listed above.  Records from Coumadin clinic noted:  Anticoagulation Monitoring 03/04/2015  INR goal 2.0-3.0  Assoc. INR Date 03/04/2015  Associated INR 2.6  Pt. deviation No  Current weekly dose 31.5 mg  Sunday dose 4.5 mg  Monday dose 4.5 mg  Tuesday dose 4.5 mg  Wednesday dose 4.5 mg  Thursday dose 4.5 mg  Friday dose 4.5 mg  Saturday dose 4.5 mg  Weekly dose 31.5 mg  Dose description Continue coumadin 1 1/2 tablets daily . . .  Return date 04/01/2015   Goal of Therapy:  INR 2-3 Monitor platelets by anticoagulation protocol: Yes   Plan:  Coumadin 4.5mg  po today x 1 (home dose) INR daily  Margo Aye, Travaughn Vue A 03/17/2015,3:19 PM

## 2015-03-17 NOTE — Progress Notes (Signed)
ANTIBIOTIC CONSULT NOTE - INITIAL  Pharmacy Consult for Rocephin Indication: UTI  Allergies  Allergen Reactions  . Penicillins Rash    Patient Measurements: Height: 4\' 11"  (149.9 cm) Weight: 167 lb (75.751 kg) IBW/kg (Calculated) : 43.2  Vital Signs: Temp: 97.9 F (36.6 C) (05/24 1439) Temp Source: Oral (05/24 1439) BP: 150/82 mmHg (05/24 1439) Pulse Rate: 86 (05/24 1439) Intake/Output from previous day:   Intake/Output from this shift:    Labs:  Recent Labs  03/17/15 1038  WBC 9.4  HGB 16.2*  PLT 214  CREATININE 2.10*   Estimated Creatinine Clearance: 23.7 mL/min (by C-G formula based on Cr of 2.1). No results for input(s): VANCOTROUGH, VANCOPEAK, VANCORANDOM, GENTTROUGH, GENTPEAK, GENTRANDOM, TOBRATROUGH, TOBRAPEAK, TOBRARND, AMIKACINPEAK, AMIKACINTROU, AMIKACIN in the last 72 hours.   Microbiology: No results found for this or any previous visit (from the past 720 hour(s)).  Medical History: Past Medical History  Diagnosis Date  . Hypertension   . Type 2 diabetes mellitus     Type II  . Atrial fibrillation   . Nephrolithiasis   . Overweight(278.02)   . UTI (urinary tract infection)   . Hypothyroidism (acquired)   . CHF (congestive heart failure)     chronic diastolic   Anti-infectives    Start     Dose/Rate Route Frequency Ordered Stop   03/19/15 1200  vancomycin (VANCOCIN) IVPB 1000 mg/200 mL premix     1,000 mg 200 mL/hr over 60 Minutes Intravenous Every T-Th-Sa (Hemodialysis) 03/17/15 1502     03/18/15 1300  cefTRIAXone (ROCEPHIN) 1 g in dextrose 5 % 50 mL IVPB - Premix     1 g 100 mL/hr over 30 Minutes Intravenous Every 24 hours 03/17/15 1549     03/17/15 1300  cefTRIAXone (ROCEPHIN) 1 g in dextrose 5 % 50 mL IVPB     1 g 100 mL/hr over 30 Minutes Intravenous  Once 03/17/15 1248 03/17/15 1353     Assessment: 64yo female admitted with hyperglycemia and dysuria.  Asked to initiate Rocephin for UTI.  No renal adjustment needed.  Goal of  Therapy:  Eradicate infection.  Plan:  Rocephin 1gm IV q24hrs Monitor labs, progress, cultures and sensitivities  Margo Aye, Ian Castagna A 03/17/2015,3:50 PM

## 2015-03-17 NOTE — ED Notes (Addendum)
Seen PCP  Yesterday and was called and told to come to ER for her labs.  Was told her sugar was too high and she was dehydrated.

## 2015-03-17 NOTE — ED Notes (Signed)
Report given to [facility] for room 303.

## 2015-03-17 NOTE — ED Notes (Signed)
PT c/o generalized weakness with hyperglycemia and increased thirst with increased urination x3 days. PT seen at PCP yesterday and was told to come to ED for eval and blood work.

## 2015-03-17 NOTE — H&P (Signed)
[facility] History and Physical  Amber Armstrong ZOX:[id number] DOB: Jul 03, 1951 DOA: 03/17/2015  Referring physician: Jodi Mourning PCP: Wilson Singer, MD   Chief Complaint: abnormal labs  HPI: Amber Armstrong is a very pleasant 64 y.o. female with a past medical history that includes diabetes on oral agents, chronic diastolic heart failure, hypertension, A. fib on Coumadin presents to the emergency department the chief complaint of abnormal labs. Initial evaluation in the emergency department reveals hyperglycemia without ketoacidosis a serum glucose of 488 hyponatremia mild hyperkalemia and urinary tract infection.  She reports being in her usual state of health she went to her primary care provider yesterday and blood work was drawn. She was notified today of high blood sugar and instructed to come to the emergency department. She takes one oral agent and checks her blood sugar twice a day. She reports the ranges been 120-140 except for the last 3 days when it was trending up in the thrush she went to her primary care provider. Associated symptoms include dry mouth and frequent urination headache. She denies abdominal pain nausea vomiting dysuria hematuria. She denies chest pain palpitation shortness of breath lower extremity edema or orthopnea. She reports compliance with her medications.  In the emergency department she is afebrile hemodynamically stable and not hypoxic. She was given 5 units of insulin intravenously 1 L of normal saline, Rocephin.    Review of Systems:  10 point review of systems complete and all systems are negative except as indicated in the history of present illness Past Medical History  Diagnosis Date  . Hypertension   . Type 2 diabetes mellitus     Type II  . Atrial fibrillation   . Nephrolithiasis   . Overweight(278.02)   . UTI (urinary tract infection)   . Hypothyroidism (acquired)   . CHF (congestive heart failure)    Past Surgical History  Procedure  Laterality Date  . Thyroidectomy    . Groin dissection Right 04/20/2013    Procedure: GROIN EXPLORATION;  Surgeon: Fabio Bering, MD;  Location: [facility];  Service: General;  Laterality: Right;   Social History:  reports that she has never smoked. She has never used smokeless tobacco. She reports that she does not drink alcohol or use illicit drugs. Lives at home with her 62 year old mother. She ambulates without assistance and is fairly independent with ADLs. Allergies  Allergen Reactions  . Penicillins Rash    Family History  Problem Relation Age of Onset  . Arrhythmia Mother     Atrial fib    Prior to Admission medications   Medication Sig Start Date End Date Taking? Authorizing Provider  acetaminophen (TYLENOL) 500 MG tablet Take 500 mg by mouth every 6 (six) hours as needed for pain.   Yes Historical Provider, MD  aspirin EC 81 MG tablet Take 81 mg by mouth daily.   Yes Historical Provider, MD  furosemide (LASIX) 40 MG tablet Take 20 mg by mouth 2 (two) times daily.  06/17/13  Yes Jodelle Gross, NP  glimepiride (AMARYL) 2 MG tablet Take 1 mg by mouth daily before breakfast.    Yes Historical Provider, MD  levothyroxine (SYNTHROID, LEVOTHROID) 200 MCG tablet Take 125 mcg by mouth daily before breakfast.    Yes Historical Provider, MD  metoprolol (LOPRESSOR) 50 MG tablet Take 1.5 tablets (75 mg total) by mouth 2 (two) times daily. 10/15/14  Yes Antoine Poche, MD  [facility] VERIO test strip  10/05/13  Yes Historical Provider, MD  spironolactone (  ALDACTONE) 25 MG tablet Take 12.5 mg by mouth daily.  06/17/13  Yes Jodelle Gross, NP  warfarin (COUMADIN) 3 MG tablet Take 1 1/2 tablets daily or as directed 02/11/15  Yes Antoine Poche, MD   Physical Exam: Filed Vitals:   03/17/15 1245 03/17/15 1300 03/17/15 1315 03/17/15 1332  BP:  159/88  154/84  Pulse: 88 85 86 83  Temp:    97.7 F (36.5 C)  TempSrc:    Oral  Resp:  18  18  Height:      Weight:      SpO2: 98% 99%  100% 100%    Wt Readings from Last 3 Encounters:  03/17/15 75.751 kg (167 lb)  03/04/15 77.111 kg (170 lb)  07/01/14 77.565 kg (171 lb)    General:  Appears calm and comfortable Eyes: PERRL, normal lids, irises & conjunctiva ENT: grossly normal hearing, he does membranes of her mouth are pink but very dry Neck: no LAD, masses or thyromegaly Cardiovascular: RRR, no m/r/g. No LE edema. Pedal pulses present and palpable Respiratory: Mild increased work of breathing with conversation breath sounds with fair air flow hear no wheeze. Abdomen: soft, ntnd positive bowel sounds no mass organomegaly noted no rebound Skin: no rash or induration seen on limited exam Musculoskeletal: grossly normal tone BUE/BLE Psychiatric: grossly normal mood and affect, speech fluent and appropriate Neurologic: grossly non-focal. Speech clear facial symmetry cranial nerves II through XII grossly intact           Labs on Admission:  Basic Metabolic Panel:  Recent Labs Lab 03/17/15 1038  NA 131*  K 5.2*  CL 98*  CO2 21*  GLUCOSE 488*  BUN 82*  CREATININE 2.10*  CALCIUM 9.3   Liver Function Tests: No results for input(s): AST, ALT, ALKPHOS, BILITOT, PROT, ALBUMIN in the last 168 hours. No results for input(s): LIPASE, AMYLASE in the last 168 hours. No results for input(s): AMMONIA in the last 168 hours. CBC:  Recent Labs Lab 03/17/15 1038  WBC 9.4  NEUTROABS 7.1  HGB 16.2*  HCT 45.9  MCV 82.7  PLT 214   Cardiac Enzymes: No results for input(s): CKTOTAL, CKMB, CKMBINDEX, TROPONINI in the last 168 hours.  BNP (last 3 results) No results for input(s): BNP in the last 8760 hours.  ProBNP (last 3 results) No results for input(s): PROBNP in the last 8760 hours.  CBG:  Recent Labs Lab 03/17/15 1022 03/17/15 1253  GLUCAP 490* 415*    Radiological Exams on Admission: No results found.  EKG: none  Assessment/Plan Principal Problem:   Hyperglycemia without ketosis; etiology  uncertain may be related to urinary tract infection. Patient reports compliance with diet and medications. She reports monitoring her CBG and when it begins to trend up she "goes to the doctor". Will admit to medical floor. She received 5 units of insulin intravenously in the emergency department as well as IV fluids. Will continue fairly vigorous IV fluids and use sliding scale insulin for optimal control. Will obtain a hemoglobin A1c. Hold her home oral agent. Active Problems: UTI (lower urinary tract infection): Likely related to #1. Will await urine culture in the meantime continue Rocephin that was initiated in the emergency department. At the time of my exam she is afebrile and hemodynamically stable and not toxic appearing    Hyponatremia: Related to #1. IV fluids as indicated in #1 will recheck in the morning. Monitor intake and output    Dehydration: Related to #1. Fairly vigorous IV  fluids. Will hold her home Lasix and spironolactone for now. Monitor closely as she has a history of chronic diastolic heart failure and right ventricular hypertrophy.  Chronic diastolic heart failure. Appears somewhat dry on admission. Echo done one year ago reveals an EF of 60-65% with indeterminate diastolic function, moderate left atrial enlargement, severe RV enlargement. Continue her home beta blocker. Holding Lasix and spironolactone for now. Daily weights monitor intake and output.  FIBRILLATION, ATRIAL: On Coumadin. Rate controlled. INR 2.93. Will request pharmacy to dose Coumadin.    Essential hypertension: Controlled     Code Status: full DVT Prophylaxis: Family Communication: none present Disposition Plan: home hopefully 24 hours  Time spent: 60 minutes  [facility] Pager [phone]

## 2015-03-18 ENCOUNTER — Encounter (HOSPITAL_COMMUNITY): Payer: Self-pay | Admitting: Internal Medicine

## 2015-03-18 DIAGNOSIS — I5032 Chronic diastolic (congestive) heart failure: Secondary | ICD-10-CM | POA: Diagnosis not present

## 2015-03-18 DIAGNOSIS — N390 Urinary tract infection, site not specified: Secondary | ICD-10-CM

## 2015-03-18 DIAGNOSIS — E860 Dehydration: Secondary | ICD-10-CM | POA: Diagnosis not present

## 2015-03-18 DIAGNOSIS — R739 Hyperglycemia, unspecified: Secondary | ICD-10-CM | POA: Diagnosis present

## 2015-03-18 DIAGNOSIS — N179 Acute kidney failure, unspecified: Secondary | ICD-10-CM | POA: Diagnosis not present

## 2015-03-18 LAB — GLUCOSE, CAPILLARY
GLUCOSE-CAPILLARY: 238 mg/dL — AB (ref 65–99)
Glucose-Capillary: 261 mg/dL — ABNORMAL HIGH (ref 65–99)
Glucose-Capillary: 406 mg/dL — ABNORMAL HIGH (ref 65–99)

## 2015-03-18 LAB — HEMOGLOBIN A1C
Hgb A1c MFr Bld: 10.8 % — ABNORMAL HIGH (ref 4.8–5.6)
Mean Plasma Glucose: 263 mg/dL

## 2015-03-18 LAB — BASIC METABOLIC PANEL
Anion gap: 6 (ref 5–15)
BUN: 49 mg/dL — AB (ref 6–20)
CALCIUM: 8.5 mg/dL — AB (ref 8.9–10.3)
CHLORIDE: 113 mmol/L — AB (ref 101–111)
CO2: 21 mmol/L — AB (ref 22–32)
Creatinine, Ser: 1.37 mg/dL — ABNORMAL HIGH (ref 0.44–1.00)
GFR calc non Af Amer: 40 mL/min — ABNORMAL LOW (ref >60)
GFR, EST AFRICAN AMERICAN: 46 mL/min — AB (ref >60)
Glucose, Bld: 194 mg/dL — ABNORMAL HIGH (ref 65–99)
Potassium: 4.8 mmol/L (ref 3.5–5.1)
Sodium: 140 mmol/L (ref 135–145)

## 2015-03-18 LAB — CBC
HCT: 41.6 % (ref 36.0–46.0)
Hemoglobin: 13.6 g/dL (ref 12.0–15.0)
MCH: 27.4 pg (ref 26.0–34.0)
MCHC: 32.7 g/dL (ref 30.0–36.0)
MCV: 83.7 fL (ref 78.0–100.0)
PLATELETS: 185 10*3/uL (ref 150–400)
RBC: 4.97 MIL/uL (ref 3.87–5.11)
RDW: 13.8 % (ref 11.5–15.5)
WBC: 7.1 10*3/uL (ref 4.0–10.5)

## 2015-03-18 LAB — URINE CULTURE
COLONY COUNT: NO GROWTH
Culture: NO GROWTH

## 2015-03-18 LAB — PROTIME-INR
INR: 3.09 — AB (ref 0.00–1.49)
PROTHROMBIN TIME: 31.3 s — AB (ref 11.6–15.2)

## 2015-03-18 MED ORDER — SODIUM CHLORIDE 0.9 % IV SOLN: INTRAVENOUS | Status: DC

## 2015-03-18 MED ORDER — CEPHALEXIN 500 MG PO CAPS: 500.0000 mg | ORAL_CAPSULE | Freq: Two times a day (BID) | ORAL | Status: DC

## 2015-03-18 MED ORDER — WARFARIN SODIUM 2 MG PO TABS: 2.0000 mg | ORAL_TABLET | Freq: Once | ORAL | Status: DC

## 2015-03-18 MED ORDER — INSULIN GLARGINE 100 UNIT/ML ~~LOC~~ SOLN
10.0000 [IU] | Freq: Every day | SUBCUTANEOUS | Status: DC
  Filled 2015-03-18: qty 0.1

## 2015-03-18 MED ORDER — INSULIN STARTER KIT- PEN NEEDLES (ENGLISH)
1.0000 | Freq: Once | Status: AC
  Administered 2015-03-18: 1
  Filled 2015-03-18: qty 1

## 2015-03-18 MED ORDER — INSULIN GLARGINE 100 UNIT/ML ~~LOC~~ SOLN: 15.0000 [IU] | Freq: Every day | SUBCUTANEOUS | Status: DC

## 2015-03-18 MED ORDER — LIVING WELL WITH DIABETES BOOK
Freq: Once | Status: AC
  Administered 2015-03-18: 09:00:00
  Filled 2015-03-18: qty 1

## 2015-03-18 NOTE — Progress Notes (Signed)
Discharge instructions reviewed with pt and sister. Prescriptions given. No current questions or concerns. Will be leaving shortly

## 2015-03-18 NOTE — Progress Notes (Signed)
ANTICOAGULATION CONSULT NOTE - follow up  Pharmacy Consult for Coumadin (chronic Rx PTA) Indication: atrial fibrillation  Allergies  Allergen Reactions  . Penicillins Rash   Patient Measurements: Height: 4\' 11"  (149.9 cm) Weight: 169 lb (76.658 kg) IBW/kg (Calculated) : 43.2  Vital Signs: Temp: 98.1 F (36.7 C) (05/25 0544) Temp Source: Oral (05/25 0544) BP: 150/91 mmHg (05/25 0544) Pulse Rate: 65 (05/25 0544)  Labs:  Recent Labs  03/17/15 1038 03/17/15 1100 03/18/15 0518 03/18/15 1001  HGB 16.2*  --  13.6  --   HCT 45.9  --  41.6  --   PLT 214  --  185  --   LABPROT  --  30.1*  --  31.3*  INR  --  2.93*  --  3.09*  CREATININE 2.10*  --  1.37*  --    Estimated Creatinine Clearance: 36.6 mL/min (by C-G formula based on Cr of 1.37).  Medical History: Past Medical History  Diagnosis Date  . Hypertension   . Type 2 diabetes mellitus     Type II  . Atrial fibrillation   . Nephrolithiasis   . Overweight(278.02)   . UTI (urinary tract infection)   . Hypothyroidism (acquired)   . CHF (congestive heart failure)     chronic diastolic   Medications:  Prescriptions prior to admission  Medication Sig Dispense Refill Last Dose  . acetaminophen (TYLENOL) 500 MG tablet Take 500 mg by mouth every 6 (six) hours as needed for pain.   Past Month at Unknown time  . aspirin EC 81 MG tablet Take 81 mg by mouth daily.   03/16/2015 at Unknown time  . furosemide (LASIX) 40 MG tablet Take 20 mg by mouth 2 (two) times daily.    03/16/2015 at Unknown time  . glimepiride (AMARYL) 2 MG tablet Take 1 mg by mouth daily before breakfast.    03/16/2015 at Unknown time  . levothyroxine (SYNTHROID, LEVOTHROID) 200 MCG tablet Take 125 mcg by mouth daily before breakfast.    03/16/2015 at Unknown time  . metoprolol (LOPRESSOR) 50 MG tablet Take 1.5 tablets (75 mg total) by mouth 2 (two) times daily. 90 tablet 6 03/16/2015 at 2000  . ONETOUCH VERIO test strip    03/16/2015 at Unknown time  .  spironolactone (ALDACTONE) 25 MG tablet Take 12.5 mg by mouth daily.    03/16/2015 at Unknown time  . warfarin (COUMADIN) 3 MG tablet Take 1 1/2 tablets daily or as directed 45 tablet 3 03/16/2015 at 2000   Assessment: 64yo female on chronic Coumadin PTA for h/o afib.  INR is therapeutic on admission and has now trended up to upper end of goal range.  Home dose listed above.  Records from Coumadin clinic noted:  Anticoagulation Monitoring 03/04/2015  INR goal 2.0-3.0  Assoc. INR Date 03/04/2015  Associated INR 2.6  Pt. deviation No  Current weekly dose 31.5 mg  Sunday dose 4.5 mg  Monday dose 4.5 mg  Tuesday dose 4.5 mg  Wednesday dose 4.5 mg  Thursday dose 4.5 mg  Friday dose 4.5 mg  Saturday dose 4.5 mg  Weekly dose 31.5 mg  Dose description Continue coumadin 1 1/2 tablets daily . . .  Return date 04/01/2015   Goal of Therapy:  INR 2-3 Monitor platelets by anticoagulation protocol: Yes   Plan:  Coumadin 2mg  po today x 1 (dose reduction to discourage further rise of INR) INR daily  Margo Aye, Peja Allender A 03/18/2015,10:28 AM

## 2015-03-18 NOTE — Progress Notes (Signed)
Notified MD, pt had a weight gain from yesterday. Pt's weight was 88.86 and is now 89.086.

## 2015-03-18 NOTE — Care Management Note (Signed)
Case Management Note  Patient Details  Name: SHALEY HEDDINGS MRN: [id number] Date of Birth: 1951-03-23   Expected Discharge Date:                  Expected Discharge Plan:  Home/Self Care  In-House Referral:  NA  Discharge planning Services  CM Consult  Post Acute Care Choice:  NA Choice offered to:  NA  DME Arranged:    DME Agency:     HH Arranged:    HH Agency:     Status of Service:  Completed, signed off  Medicare Important Message Given:    Date Medicare IM Given:    Medicare IM give by:    Date Additional Medicare IM Given:    Additional Medicare Important Message give by:     If discussed at Long Length of Stay Meetings, dates discussed:    Additional Comments: Pt is from home, lives with mother and independent at baseline. Pt works, has a cane but doesn't use it. Pt has no HH services prior to admission. Pt plans to DC home with self care today. No CM needs.   Malcolm Metro, RN 03/18/2015, 10:08 AM

## 2015-03-18 NOTE — Progress Notes (Signed)
Report given to oncoming nurse. No pt concerns were noted during report at bedside. Pt's were stable during change of shift assessment. Day shift nurse verbalized understanding.  

## 2015-03-18 NOTE — Discharge Summary (Signed)
Physician Discharge Summary  SHAUNICE LEVITAN ZOX:[id number] DOB: 03-Dec-1950 DOA: 03/17/2015  PCP: Wilson Singer, MD  Admit date: 03/17/2015 Discharge date: 03/18/2015  Time spent: 40 minutes  Recommendations for Outpatient Follow-up:  1. Follow up with Dr Randol Kern 1-2 weeks for evaluation of DM control and resolution of UTI. Insulin resumed at discharge  2. INR check 03/25/15  Discharge Diagnoses:  Principal Problem:   Hyperglycemia without ketosis Active Problems:   Diabetes   Morbid obesity with BMI of 40.0-44.9, adult   Essential hypertension   FIBRILLATION, ATRIAL   Right ventricular hypertrophy   Acute renal failure   Chronic diastolic heart failure   UTI (lower urinary tract infection)   Hyponatremia   Dehydration   Hyperglycemia due to type 2 diabetes mellitus   Hyperglycemia   Discharge Condition: stable  Diet recommendation: carb modified heart healthy  Filed Weights   03/17/15 1014 03/18/15 0544  Weight: 75.751 kg (167 lb) 76.658 kg (169 lb)    History of present illness:  Amber Armstrong is a very pleasant 64 y.o. female with a past medical history that includes diabetes on oral agents, chronic diastolic heart failure, hypertension, A. fib on Coumadin presented to the emergency department on 03/17/15 with the chief complaint of abnormal labs. Initial evaluation in the emergency department revealed hyperglycemia without ketoacidosis a serum glucose of 488 hyponatremia mild hyperkalemia and urinary tract infection.  She reported being in her usual state of health she went to her primary care provider 1 day prior due to increased thirst and frequent voiding. Blood work was drawn. She was notified on day of admission of high blood sugar and instructed to come to the emergency department. She currently on one oral agent and checks her blood sugar twice a day. She reported the range had been 120-140 except for the previous 3 days when it was trending up. Associated symptoms  included dry mouth and frequent urination and headache. She denied abdominal pain nausea vomiting dysuria hematuria. She denied chest pain palpitation shortness of breath lower extremity edema or orthopnea. She reported compliance with her medications.  In the emergency department she was afebrile hemodynamically stable and not hypoxic. She was given 5 units of insulin intravenously 1 L of normal saline as well as Rocephin.  Hospital Course:   Diabetes/Hyperglycemia without ketosis; uncontrolled. A1c 10.8. Patient reported compliance with diet and medications. She received 5 units of insulin intravenously in the emergency department as well as IV fluids. CBG range 230-290. Will discharge with Lantus 15 units qhs. She reports being on Lantus several years ago. Will continue amaryl as well. Recommend close OP follow up with Dr Randol Kern Active Problems: UTI (lower urinary tract infection): Likely related to #1. Uine cultures pending at discharge. Symptoms resolved. She received 2 doses of Rocephin. Will discharge with Keflex for 3 more days to complete 5 day course. She remianed afebrile and not toxic appearing   Hyponatremia: Related to #1. Resolved at discharge.    Dehydration: Related to #1. Resolved at discharge. History of chronic diastolic heart failure and right ventricular hypertrophy. Lasix and spironolactone resumed at discharge  Chronic diastolic heart failure. Appeared somewhat dry on admission. Echo done one year ago reveals an EF of 60-65% with indeterminate diastolic function, moderate left atrial enlargement, severe RV enlargement. Continued her home beta blocker. Held Lasix and spironolactone. Resumed these at discharge.   FIBRILLATION, ATRIAL: On Coumadin. Rate controlled. INR 3.09 at discharge. Coumadin dose decreased for one day. INR check scheduled 03/25/15.  Essential hypertension: Controlled    Procedures:  none  Consultations:  none  Discharge Exam: Filed Vitals:    03/18/15 1337  BP: 138/83  Pulse: 68  Temp: 97.9 F (36.6 C)  Resp: 18    General: well nourished appears comfortable Cardiovascular: irregularly irregular no MGR no Le edema Respiratory: normal effort BS clear bilaterally no wheezse  Discharge Instructions    Current Discharge Medication List    START taking these medications   Details  cephALEXin (KEFLEX) 500 MG capsule Take 1 capsule (500 mg total) by mouth 2 (two) times daily. Qty: 6 capsule, Refills: 0    insulin glargine (LANTUS) 100 UNIT/ML injection Inject 0.15 mLs (15 Units total) into the skin at bedtime. Qty: 10 mL, Refills: 11      CONTINUE these medications which have NOT CHANGED   Details  acetaminophen (TYLENOL) 500 MG tablet Take 500 mg by mouth every 6 (six) hours as needed for pain.    aspirin EC 81 MG tablet Take 81 mg by mouth daily.    furosemide (LASIX) 40 MG tablet Take 20 mg by mouth 2 (two) times daily.     glimepiride (AMARYL) 2 MG tablet Take 1 mg by mouth daily before breakfast.     levothyroxine (SYNTHROID, LEVOTHROID) 200 MCG tablet Take 125 mcg by mouth daily before breakfast.     metoprolol (LOPRESSOR) 50 MG tablet Take 1.5 tablets (75 mg total) by mouth 2 (two) times daily. Qty: 90 tablet, Refills: 6    ONETOUCH VERIO test strip     spironolactone (ALDACTONE) 25 MG tablet Take 12.5 mg by mouth daily.     warfarin (COUMADIN) 3 MG tablet Take 1 1/2 tablets daily or as directed Qty: 45 tablet, Refills: 3       Allergies  Allergen Reactions  . Penicillins Rash   Follow-up Information    Follow up with [facility] On 03/25/2015.   Why:  appointment at 10am for INR check   Contact information:   [address] [place] [id number]        The results of significant diagnostics from this hospitalization (including imaging, microbiology, ancillary and laboratory) are listed below for reference.    Significant Diagnostic Studies: No results  found.  Microbiology: No results found for this or any previous visit (from the past 240 hour(s)).   Labs: Basic Metabolic Panel:  Recent Labs Lab 03/17/15 1038 03/18/15 0518  NA 131* 140  K 5.2* 4.8  CL 98* 113*  CO2 21* 21*  GLUCOSE 488* 194*  BUN 82* 49*  CREATININE 2.10* 1.37*  CALCIUM 9.3 8.5*  MG 2.5*  --    Liver Function Tests: No results for input(s): AST, ALT, ALKPHOS, BILITOT, PROT, ALBUMIN in the last 168 hours. No results for input(s): LIPASE, AMYLASE in the last 168 hours. No results for input(s): AMMONIA in the last 168 hours. CBC:  Recent Labs Lab 03/17/15 1038 03/18/15 0518  WBC 9.4 7.1  NEUTROABS 7.1  --   HGB 16.2* 13.6  HCT 45.9 41.6  MCV 82.7 83.7  PLT 214 185   Cardiac Enzymes: No results for input(s): CKTOTAL, CKMB, CKMBINDEX, TROPONINI in the last 168 hours. BNP: BNP (last 3 results) No results for input(s): BNP in the last 8760 hours.  ProBNP (last 3 results) No results for input(s): PROBNP in the last 8760 hours.  CBG:  Recent Labs Lab 03/17/15 1428 03/17/15 1626 03/17/15 2033 03/18/15 0722 03/18/15 1131  GLUCAP 313* 296*  263* 238* 261*       Signed:  Gwenyth Bender  [facility] 03/18/2015, 3:21 PM

## 2015-03-18 NOTE — Progress Notes (Addendum)
Inpatient Diabetes Program Recommendations  AACE/ADA: New Consensus Statement on Inpatient Glycemic Control (2013)  Target Ranges:  Prepandial:   less than 140 mg/dL      Peak postprandial:   less than 180 mg/dL (1-2 hours)      Critically ill patients:  140 - 180 mg/dL   Results for Amber Armstrong, Amber Armstrong (MRN [id number]) as of 03/18/2015 11:15  Ref. Range 03/17/2015 12:53 03/17/2015 14:28 03/17/2015 16:26 03/17/2015 20:33 03/18/2015 07:22  Glucose-Capillary Latest Ref Range: 65-99 mg/dL 491 (H) 791 (H) 505 (H) 263 (H) 238 (H)   Reason for Visit: Hyperglycemia  Diabetes history: DM 2 Outpatient Diabetes medications: Amaryl 1 mg Daily Current orders for Inpatient glycemic control: Novolog 0-15 units TID, Novolog 0-5 units QHS  Inpatient Diabetes Program Recommendations Insulin - Basal: Glucose range is high. Please consider ordering Levemir 10 units Q24hrs.  HgbA1C: 10.8%. Due to the A1c level, patient may need to be placed on basal insulin at time of discharge.   Note: RN, please place videos on for patient to watch. RN to teach DM survival skills to patient and teach the patient to give injections, if MD orders it for d/c. Make sure patient knows their A1c level. CM may need to be consulted to see about cost of insulin pen if patient is to be discharged on basal insulin. DM Coordinator not on site today.  1151am: Called patient in room spoke to her about her A1c levels. Dr. Carolee Rota is her PCP and manages her DM. She has been on Amaryl for 2 years. I spoke with her about insulin and she is ok with being discharged on insulin if it is the MD's desire to do so. I also spoke with RN about letting the patient watch the insulin pen video (# [phone]). Spoke with patient about eating right and exercising. Patient has a PCP appointment scheduled for Tuesday. Spoke with her about taking her AVS to her follow up appointment.   Thanks,  Christena Deem RN, MSN, [facility] Inpatient Diabetes Coordinator Team Pager  [phone]

## 2015-03-18 NOTE — Plan of Care (Signed)
Problem: Phase I Progression Outcomes Goal: Pain controlled with appropriate interventions Outcome: Completed/Met Date Met:  03/18/15 Pt denies pain Goal: OOB as tolerated unless otherwise ordered Outcome: Progressing Pt ambulates to the bathroom. Gait steady. Goal: Vital Signs stable- temperature less than 102 Outcome: Progressing Pt vitals remain stable Goal: Hemodynamically stable Outcome: Progressing PT's blood sugar is now down to 296.

## 2015-03-18 NOTE — Progress Notes (Signed)
PT Cancellation Note  Patient Details Name: Amber Armstrong MRN: [id number] DOB: 1951/08/17   Cancelled Treatment:    Reason Eval/Treat Not Completed: PT screened, no needs identified, will sign off   Konrad Penta  PT 03/18/2015, 9:01 AM

## 2015-04-08 ENCOUNTER — Ambulatory Visit (INDEPENDENT_AMBULATORY_CARE_PROVIDER_SITE_OTHER): Payer: Medicare HMO | Admitting: *Deleted

## 2015-04-08 DIAGNOSIS — I4891 Unspecified atrial fibrillation: Secondary | ICD-10-CM

## 2015-04-08 DIAGNOSIS — Z5181 Encounter for therapeutic drug level monitoring: Secondary | ICD-10-CM

## 2015-04-08 LAB — POCT INR: INR: 2.1

## 2015-05-06 ENCOUNTER — Ambulatory Visit (INDEPENDENT_AMBULATORY_CARE_PROVIDER_SITE_OTHER): Payer: Medicare HMO | Admitting: *Deleted

## 2015-05-06 DIAGNOSIS — Z5181 Encounter for therapeutic drug level monitoring: Secondary | ICD-10-CM | POA: Diagnosis not present

## 2015-05-06 DIAGNOSIS — I4891 Unspecified atrial fibrillation: Secondary | ICD-10-CM | POA: Diagnosis not present

## 2015-05-06 LAB — POCT INR: INR: 2.3

## 2015-06-17 ENCOUNTER — Ambulatory Visit (INDEPENDENT_AMBULATORY_CARE_PROVIDER_SITE_OTHER): Payer: Medicare HMO | Admitting: *Deleted

## 2015-06-17 DIAGNOSIS — I4891 Unspecified atrial fibrillation: Secondary | ICD-10-CM

## 2015-06-17 DIAGNOSIS — Z5181 Encounter for therapeutic drug level monitoring: Secondary | ICD-10-CM | POA: Diagnosis not present

## 2015-06-17 LAB — POCT INR: INR: 2.4

## 2015-07-13 ENCOUNTER — Other Ambulatory Visit: Payer: Self-pay | Admitting: *Deleted

## 2015-07-13 ENCOUNTER — Telehealth: Payer: Self-pay | Admitting: Cardiology

## 2015-07-13 MED ORDER — WARFARIN SODIUM 3 MG PO TABS: ORAL_TABLET | ORAL | Status: DC

## 2015-07-13 NOTE — Telephone Encounter (Signed)
Refill was sent this morning.

## 2015-07-13 NOTE — Telephone Encounter (Signed)
Patient said she is almost out of her Warfarin. She said the pharmacy told her that they faxed a refill request but they have not heard back from [place] yet.

## 2015-07-29 ENCOUNTER — Ambulatory Visit (INDEPENDENT_AMBULATORY_CARE_PROVIDER_SITE_OTHER): Payer: Medicare HMO | Admitting: *Deleted

## 2015-07-29 DIAGNOSIS — I4891 Unspecified atrial fibrillation: Secondary | ICD-10-CM

## 2015-07-29 DIAGNOSIS — Z5181 Encounter for therapeutic drug level monitoring: Secondary | ICD-10-CM

## 2015-07-29 LAB — POCT INR: INR: 1.9

## 2015-09-09 ENCOUNTER — Ambulatory Visit (INDEPENDENT_AMBULATORY_CARE_PROVIDER_SITE_OTHER): Payer: Medicare HMO | Admitting: *Deleted

## 2015-09-09 DIAGNOSIS — I4891 Unspecified atrial fibrillation: Secondary | ICD-10-CM

## 2015-09-09 DIAGNOSIS — Z5181 Encounter for therapeutic drug level monitoring: Secondary | ICD-10-CM | POA: Diagnosis not present

## 2015-09-09 LAB — POCT INR: INR: 2.6

## 2015-10-07 ENCOUNTER — Ambulatory Visit (INDEPENDENT_AMBULATORY_CARE_PROVIDER_SITE_OTHER): Payer: Medicare HMO | Admitting: *Deleted

## 2015-10-07 DIAGNOSIS — I4891 Unspecified atrial fibrillation: Secondary | ICD-10-CM

## 2015-10-07 DIAGNOSIS — Z5181 Encounter for therapeutic drug level monitoring: Secondary | ICD-10-CM | POA: Diagnosis not present

## 2015-10-07 LAB — POCT INR: INR: 2.5

## 2015-10-14 ENCOUNTER — Telehealth: Payer: Self-pay

## 2015-10-14 MED ORDER — METOPROLOL TARTRATE 50 MG PO TABS: 75.0000 mg | ORAL_TABLET | Freq: Two times a day (BID) | ORAL | Status: AC

## 2015-10-14 NOTE — Telephone Encounter (Signed)
Refilled rx

## 2015-10-26 ENCOUNTER — Emergency Department (HOSPITAL_COMMUNITY)
Admission: EM | Admit: 2015-10-26 | Discharge: 2015-10-26 | Disposition: A | Discharge status: Home / Self Care | Payer: PPO | Attending: Emergency Medicine | Admitting: Emergency Medicine

## 2015-10-26 ENCOUNTER — Encounter (HOSPITAL_COMMUNITY): Payer: Self-pay

## 2015-10-26 ENCOUNTER — Emergency Department (HOSPITAL_COMMUNITY): Payer: PPO

## 2015-10-26 DIAGNOSIS — X500XXA Overexertion from strenuous movement or load, initial encounter: Secondary | ICD-10-CM | POA: Diagnosis not present

## 2015-10-26 DIAGNOSIS — Z7901 Long term (current) use of anticoagulants: Secondary | ICD-10-CM | POA: Insufficient documentation

## 2015-10-26 DIAGNOSIS — Z880 Allergy status to penicillin: Secondary | ICD-10-CM | POA: Insufficient documentation

## 2015-10-26 DIAGNOSIS — S6992XA Unspecified injury of left wrist, hand and finger(s), initial encounter: Secondary | ICD-10-CM | POA: Diagnosis not present

## 2015-10-26 DIAGNOSIS — Z7982 Long term (current) use of aspirin: Secondary | ICD-10-CM | POA: Diagnosis not present

## 2015-10-26 DIAGNOSIS — T148XXA Other injury of unspecified body region, initial encounter: Secondary | ICD-10-CM

## 2015-10-26 DIAGNOSIS — I4891 Unspecified atrial fibrillation: Secondary | ICD-10-CM | POA: Insufficient documentation

## 2015-10-26 DIAGNOSIS — I10 Essential (primary) hypertension: Secondary | ICD-10-CM | POA: Insufficient documentation

## 2015-10-26 DIAGNOSIS — X503XXA Overexertion from repetitive movements, initial encounter: Secondary | ICD-10-CM

## 2015-10-26 DIAGNOSIS — Y9259 Other trade areas as the place of occurrence of the external cause: Secondary | ICD-10-CM | POA: Insufficient documentation

## 2015-10-26 DIAGNOSIS — E039 Hypothyroidism, unspecified: Secondary | ICD-10-CM | POA: Insufficient documentation

## 2015-10-26 DIAGNOSIS — Y9389 Activity, other specified: Secondary | ICD-10-CM | POA: Diagnosis not present

## 2015-10-26 DIAGNOSIS — E119 Type 2 diabetes mellitus without complications: Secondary | ICD-10-CM | POA: Diagnosis not present

## 2015-10-26 DIAGNOSIS — I5032 Chronic diastolic (congestive) heart failure: Secondary | ICD-10-CM | POA: Insufficient documentation

## 2015-10-26 DIAGNOSIS — Z87440 Personal history of urinary (tract) infections: Secondary | ICD-10-CM | POA: Insufficient documentation

## 2015-10-26 DIAGNOSIS — Z794 Long term (current) use of insulin: Secondary | ICD-10-CM | POA: Diagnosis not present

## 2015-10-26 DIAGNOSIS — Y990 Civilian activity done for income or pay: Secondary | ICD-10-CM | POA: Diagnosis not present

## 2015-10-26 DIAGNOSIS — S66912A Strain of unspecified muscle, fascia and tendon at wrist and hand level, left hand, initial encounter: Secondary | ICD-10-CM | POA: Diagnosis not present

## 2015-10-26 DIAGNOSIS — E663 Overweight: Secondary | ICD-10-CM | POA: Insufficient documentation

## 2015-10-26 DIAGNOSIS — Z79899 Other long term (current) drug therapy: Secondary | ICD-10-CM | POA: Insufficient documentation

## 2015-10-26 DIAGNOSIS — Z87442 Personal history of urinary calculi: Secondary | ICD-10-CM | POA: Diagnosis not present

## 2015-10-26 DIAGNOSIS — S60212A Contusion of left wrist, initial encounter: Secondary | ICD-10-CM | POA: Diagnosis not present

## 2015-10-26 DIAGNOSIS — S66812A Strain of other specified muscles, fascia and tendons at wrist and hand level, left hand, initial encounter: Secondary | ICD-10-CM | POA: Diagnosis not present

## 2015-10-26 NOTE — ED Notes (Signed)
PA Julie Idol at bedside. 

## 2015-10-26 NOTE — ED Notes (Signed)
Pt is complaining that left wrist is painful and swollen. Denies injury

## 2015-10-26 NOTE — Discharge Instructions (Signed)
Wear your thumb splint for comfort.  Continue using ice and elevation for pain relief and swelling.  Plan to follow-up with Dr. Elonda Husky for recheck if he symptoms are not improving with rest.

## 2015-10-26 NOTE — ED Provider Notes (Signed)
CSN: [id number]     Arrival date & time 10/26/15  0915 History   First MD Initiated Contact with Patient 10/26/15 0935     Chief Complaint  Patient presents with  . Wrist Pain     (Consider location/radiation/quality/duration/timing/severity/associated sxs/prior Treatment) The history is provided by the patient.   Amber Armstrong is a 65 y.o. right handed female presenting with left wrist and thumb pain which started within the past week.  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  She endorses pain is worsened with movement and has noticed swelling across the dorsum of her hand.  She has used Tylenol with moderate improvement in her symptoms and is also applied ice.  There is no radiation of pain which is described as aching.     Past Medical History  Diagnosis Date  . Hypertension   . Type 2 diabetes mellitus (HCC)     Type II  . Atrial fibrillation (HCC)   . Nephrolithiasis   . Overweight(278.02)   . UTI (urinary tract infection)   . Hypothyroidism (acquired)   . Chronic diastolic heart failure (HCC)   . Hyperglycemia without ketosis 03/17/2015  . Right ventricular hypertrophy 12/12/2012   Past Surgical History  Procedure Laterality Date  . Thyroidectomy    . Groin dissection Right 04/20/2013    Procedure: GROIN EXPLORATION;  Surgeon: Fabio Bering, MD;  Location: [facility];  Service: General;  Laterality: Right;   Family History  Problem Relation Age of Onset  . Arrhythmia Mother     Atrial fib   Social History  Substance Use Topics  . Smoking status: Never Smoker   . Smokeless tobacco: Never Used  . Alcohol Use: No   OB History    No data available     Review of Systems  Constitutional: Negative for fever.  Musculoskeletal: Positive for arthralgias. Negative for myalgias and joint swelling.  Neurological: Negative for weakness and  numbness.      Allergies  Penicillins  Home Medications   Prior to Admission medications   Medication Sig Start Date End Date Taking? Authorizing Provider  acetaminophen (TYLENOL) 500 MG tablet Take 500 mg by mouth every 6 (six) hours as needed for pain.   Yes Historical Provider, MD  aspirin EC 81 MG tablet Take 81 mg by mouth daily.   Yes Historical Provider, MD  furosemide (LASIX) 40 MG tablet Take 20 mg by mouth 2 (two) times daily.  06/17/13  Yes Jodelle Gross, NP  glimepiride (AMARYL) 2 MG tablet Take 1 mg by mouth daily before breakfast.    Yes Historical Provider, MD  insulin glargine (LANTUS) 100 UNIT/ML injection Inject 0.15 mLs (15 Units total) into the skin at bedtime. Patient taking differently: Inject 20 Units into the skin at bedtime.  03/18/15  Yes Lesle Chris Black, NP  levothyroxine (SYNTHROID, LEVOTHROID) 200 MCG tablet Take 125 mcg by mouth daily before breakfast.    Yes Historical Provider, MD  metoprolol (LOPRESSOR) 50 MG tablet Take 1.5 tablets (75 mg total) by mouth 2 (two) times daily. 10/14/15  Yes Antoine Poche, MD  spironolactone (ALDACTONE) 25 MG tablet Take 12.5 mg by mouth daily.  06/17/13  Yes Jodelle Gross, NP  warfarin (COUMADIN) 3 MG tablet Take 1 1/2 tablets daily or as directed 07/13/15  Yes Antoine Poche, MD  BP 166/102 mmHg  Pulse 76  Temp(Src) 97.5 F (36.4 C) (Oral)  Resp 18  Ht 5' (1.524 m)  Wt 83.915 kg  BMI 36.13 kg/m2  SpO2 100% Physical Exam  Constitutional: She appears well-developed and well-nourished.  HENT:  Head: Atraumatic.  Neck: Normal range of motion.  Cardiovascular:  Pulses equal bilaterally  Musculoskeletal: She exhibits tenderness.       Hands: Tender to palpation along the left dorsal proximal hand and wrist area.  There is mild edema, no erythema and no increased warmth or red streaking.  Distal sensation is intact with less than 2 second cap refill in fingertips.  Radial pulse is full.  Pain is worsened  with  resistance, flex and extension of the thumb.  Neurological: She is alert. She has normal strength. She displays normal reflexes. No sensory deficit.  Skin: Skin is warm and dry.  Psychiatric: She has a normal mood and affect.    ED Course  Procedures (including critical care time) Labs Review Labs Reviewed - No data to display  Imaging Review Dg Wrist Complete Left  10/26/2015  CLINICAL DATA:  65 year old female with pain and bruising on the radial side left wrist for the past 3 days. EXAM: LEFT WRIST - COMPLETE 3+ VIEW COMPARISON:  No priors. FINDINGS: Multiple views of the left wrist demonstrate no acute displaced fracture, subluxation, dislocation, or soft tissue abnormality. IMPRESSION: No acute radiographic abnormality of the left wrist. Electronically Signed   By: Trudie Reed M.D.   On: 10/26/2015 10:00   I have personally reviewed and evaluated these images  as part of my medical decision-making.   EKG Interpretation None      MDM   Final diagnoses:  Repetitive strain injury    Thumb spica, ice, elevation, rest.  Work note,  F/u with pcp prn (has appt next week).  The patient appears reasonably screened and/or stabilized for discharge and I doubt any other medical condition or other [facility] requiring further screening, evaluation, or treatment in the ED at this time prior to discharge.     Burgess Amor, PA-C 10/26/15 1024  Linwood Dibbles, MD 10/26/15 1025

## 2015-11-02 LAB — TYPE AND SCREEN
ABO/RH(D): A NEG
Antibody Screen: NEGATIVE
Unit division: 0
Unit division: 0
Unit division: 0
Unit division: 0
Unit division: 0
Unit division: 0
Unit division: 0
Unit division: 0
Unit division: 0
Unit division: 0

## 2015-11-12 ENCOUNTER — Other Ambulatory Visit: Payer: Self-pay | Admitting: Pharmacist

## 2015-11-12 DIAGNOSIS — E119 Type 2 diabetes mellitus without complications: Secondary | ICD-10-CM | POA: Diagnosis not present

## 2015-11-12 DIAGNOSIS — I482 Chronic atrial fibrillation: Secondary | ICD-10-CM | POA: Diagnosis not present

## 2015-11-12 DIAGNOSIS — E785 Hyperlipidemia, unspecified: Secondary | ICD-10-CM | POA: Diagnosis not present

## 2015-11-12 DIAGNOSIS — I119 Hypertensive heart disease without heart failure: Secondary | ICD-10-CM | POA: Diagnosis not present

## 2015-11-12 DIAGNOSIS — E039 Hypothyroidism, unspecified: Secondary | ICD-10-CM | POA: Diagnosis not present

## 2015-11-12 DIAGNOSIS — E6609 Other obesity due to excess calories: Secondary | ICD-10-CM | POA: Diagnosis not present

## 2015-11-12 MED ORDER — WARFARIN SODIUM 3 MG PO TABS: ORAL_TABLET | ORAL | Status: DC

## 2015-11-13 ENCOUNTER — Other Ambulatory Visit: Payer: Self-pay | Admitting: Cardiology

## 2015-11-13 NOTE — Telephone Encounter (Signed)
A refill was sent to Pharmacy on 11/12/15 by Aundra Millet Pharmacist. Called the Pharmacy & spoke with Coralie Common, who confirmed that they had received the refill request.

## 2015-11-13 NOTE — Telephone Encounter (Signed)
Pt is out of her warfarin and is needing a Rx sent to her pharamcy--they have given her an emergency amount of 4 pills --she uses [facility] in [place]

## 2015-11-16 DIAGNOSIS — E6609 Other obesity due to excess calories: Secondary | ICD-10-CM | POA: Diagnosis not present

## 2015-11-16 DIAGNOSIS — E785 Hyperlipidemia, unspecified: Secondary | ICD-10-CM | POA: Diagnosis not present

## 2015-11-16 DIAGNOSIS — I119 Hypertensive heart disease without heart failure: Secondary | ICD-10-CM | POA: Diagnosis not present

## 2015-11-16 DIAGNOSIS — I482 Chronic atrial fibrillation: Secondary | ICD-10-CM | POA: Diagnosis not present

## 2015-11-23 ENCOUNTER — Ambulatory Visit (INDEPENDENT_AMBULATORY_CARE_PROVIDER_SITE_OTHER): Payer: PPO | Admitting: *Deleted

## 2015-11-23 DIAGNOSIS — Z5181 Encounter for therapeutic drug level monitoring: Secondary | ICD-10-CM

## 2015-11-23 DIAGNOSIS — I4891 Unspecified atrial fibrillation: Secondary | ICD-10-CM

## 2015-11-23 LAB — POCT INR: INR: 2.9

## 2015-12-19 IMAGING — CR DG SHOULDER 2+V*L*
3 series · 3 of 3 positions shown · non-contrast
Comparison: None.

CLINICAL DATA: Left shoulder pain

EXAM:
LEFT SHOULDER - 2+ VIEW

[view not recorded (1 of 3)]
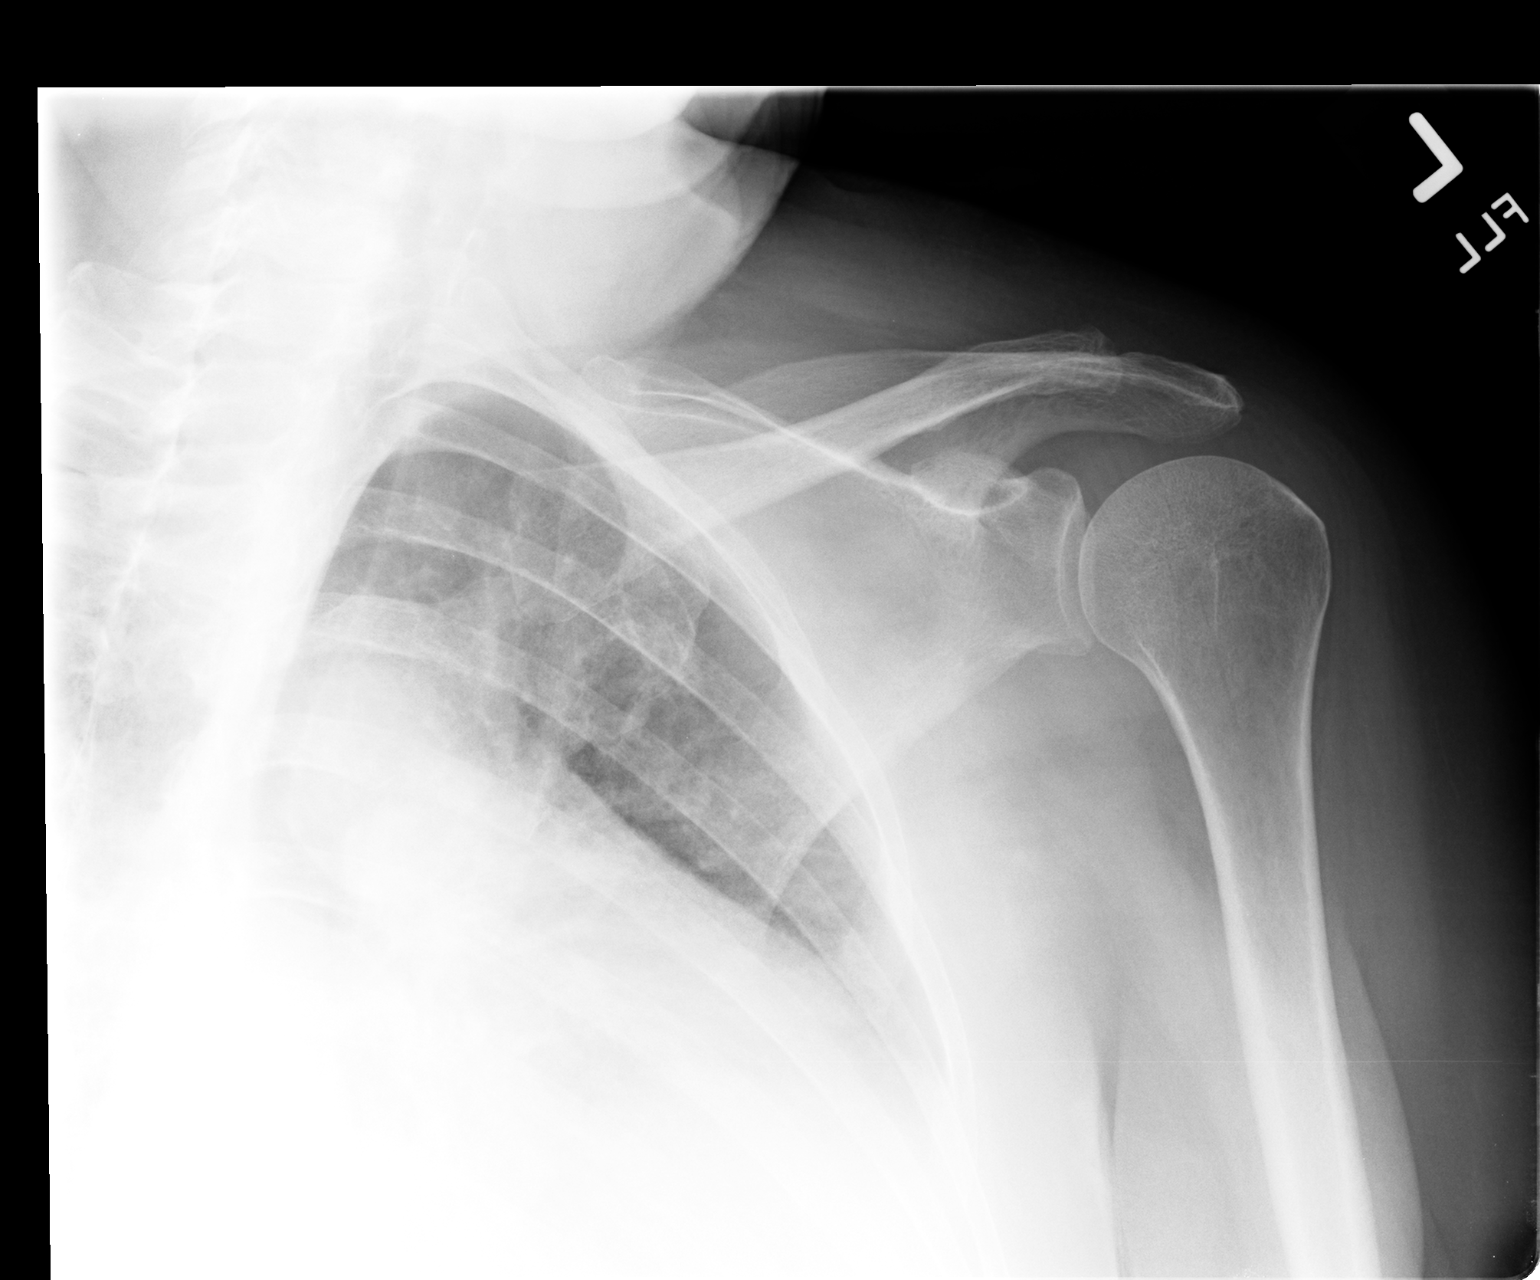

[view not recorded (2 of 3)]
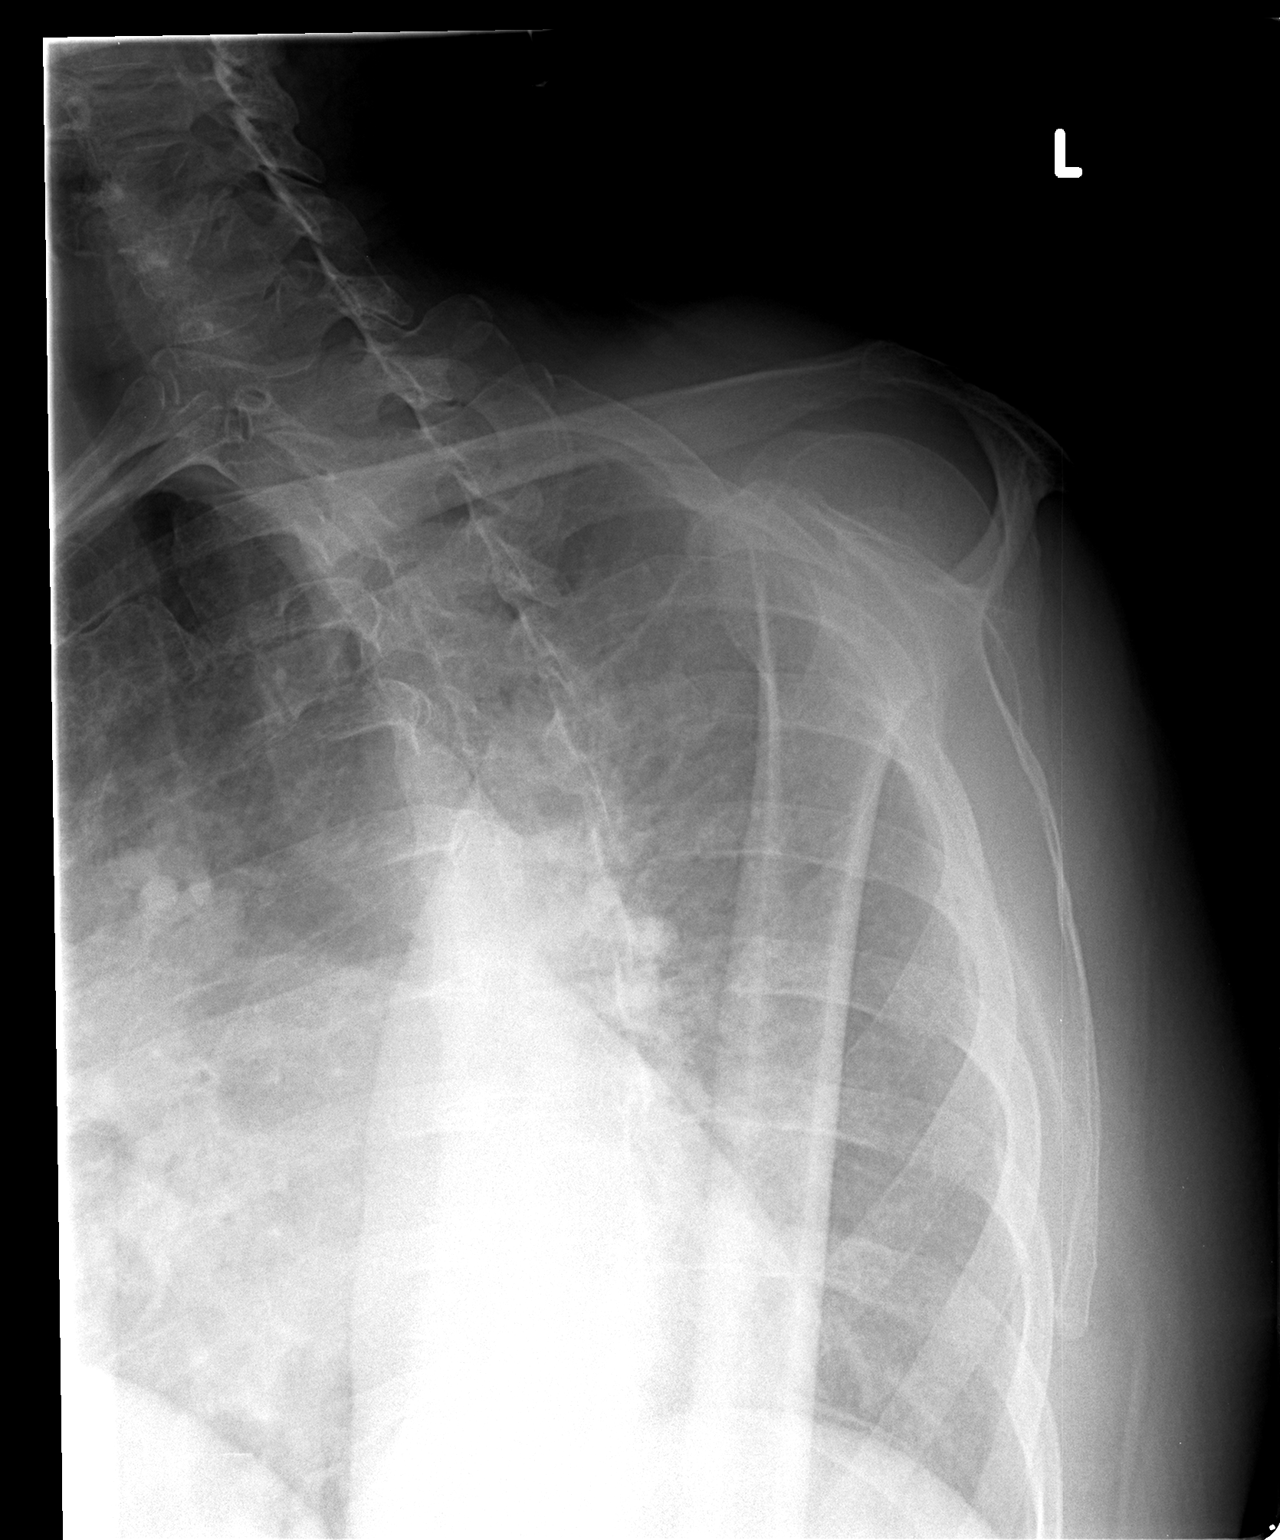

[view not recorded (3 of 3)]
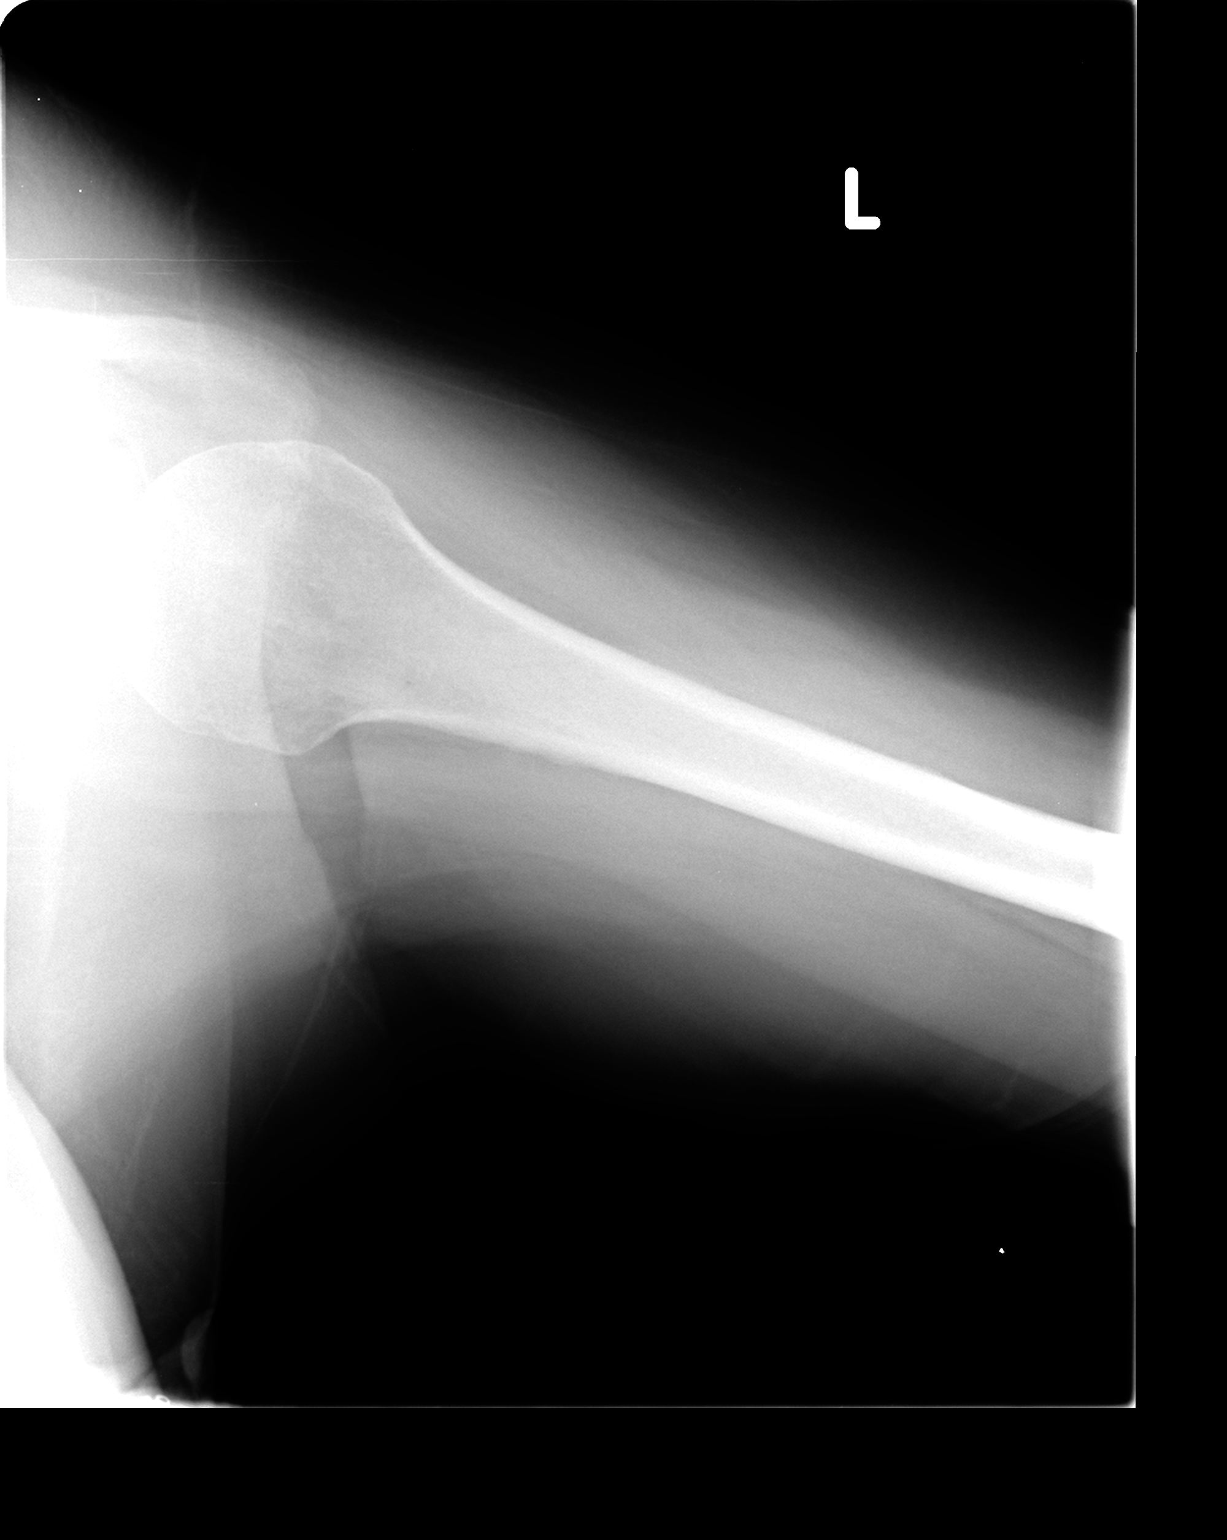

[3 of 3 positions shown; findings below may reference images not displayed]

FINDINGS: No fracture or dislocation is seen.

The joint spaces are preserved.

The visualized soft tissues are unremarkable.

Visualized left lung is clear.
IMPRESSION: No fracture or dislocation is seen.

## 2015-12-30 ENCOUNTER — Ambulatory Visit (INDEPENDENT_AMBULATORY_CARE_PROVIDER_SITE_OTHER): Payer: PPO | Admitting: Pharmacist

## 2015-12-30 DIAGNOSIS — I4891 Unspecified atrial fibrillation: Secondary | ICD-10-CM | POA: Diagnosis not present

## 2015-12-30 DIAGNOSIS — Z5181 Encounter for therapeutic drug level monitoring: Secondary | ICD-10-CM

## 2015-12-30 LAB — POCT INR: INR: 2.9

## 2016-02-10 ENCOUNTER — Ambulatory Visit (INDEPENDENT_AMBULATORY_CARE_PROVIDER_SITE_OTHER): Payer: PPO | Admitting: *Deleted

## 2016-02-10 DIAGNOSIS — I4891 Unspecified atrial fibrillation: Secondary | ICD-10-CM

## 2016-02-10 DIAGNOSIS — Z5181 Encounter for therapeutic drug level monitoring: Secondary | ICD-10-CM

## 2016-02-10 LAB — POCT INR: INR: 3.1

## 2016-02-10 MED ORDER — WARFARIN SODIUM 3 MG PO TABS: ORAL_TABLET | ORAL | Status: DC

## 2016-02-17 DIAGNOSIS — I482 Chronic atrial fibrillation: Secondary | ICD-10-CM | POA: Diagnosis not present

## 2016-02-17 DIAGNOSIS — E039 Hypothyroidism, unspecified: Secondary | ICD-10-CM | POA: Diagnosis not present

## 2016-02-17 DIAGNOSIS — I119 Hypertensive heart disease without heart failure: Secondary | ICD-10-CM | POA: Diagnosis not present

## 2016-02-17 DIAGNOSIS — E785 Hyperlipidemia, unspecified: Secondary | ICD-10-CM | POA: Diagnosis not present

## 2016-02-17 DIAGNOSIS — R931 Abnormal findings on diagnostic imaging of heart and coronary circulation: Secondary | ICD-10-CM | POA: Diagnosis not present

## 2016-02-17 DIAGNOSIS — E119 Type 2 diabetes mellitus without complications: Secondary | ICD-10-CM | POA: Diagnosis not present

## 2016-03-01 DIAGNOSIS — S60511A Abrasion of right hand, initial encounter: Secondary | ICD-10-CM | POA: Diagnosis not present

## 2016-03-02 DIAGNOSIS — Z7901 Long term (current) use of anticoagulants: Secondary | ICD-10-CM | POA: Diagnosis not present

## 2016-03-02 DIAGNOSIS — I482 Chronic atrial fibrillation: Secondary | ICD-10-CM | POA: Diagnosis not present

## 2016-03-02 DIAGNOSIS — R931 Abnormal findings on diagnostic imaging of heart and coronary circulation: Secondary | ICD-10-CM | POA: Diagnosis not present

## 2016-03-16 DIAGNOSIS — I119 Hypertensive heart disease without heart failure: Secondary | ICD-10-CM | POA: Diagnosis not present

## 2016-03-16 DIAGNOSIS — E119 Type 2 diabetes mellitus without complications: Secondary | ICD-10-CM | POA: Diagnosis not present

## 2016-03-16 DIAGNOSIS — I5022 Chronic systolic (congestive) heart failure: Secondary | ICD-10-CM | POA: Diagnosis not present

## 2016-03-16 DIAGNOSIS — E875 Hyperkalemia: Secondary | ICD-10-CM | POA: Diagnosis not present

## 2016-05-25 DIAGNOSIS — E119 Type 2 diabetes mellitus without complications: Secondary | ICD-10-CM | POA: Diagnosis not present

## 2016-05-25 DIAGNOSIS — E785 Hyperlipidemia, unspecified: Secondary | ICD-10-CM | POA: Diagnosis not present

## 2016-05-25 DIAGNOSIS — Z7901 Long term (current) use of anticoagulants: Secondary | ICD-10-CM | POA: Diagnosis not present

## 2016-05-25 DIAGNOSIS — E039 Hypothyroidism, unspecified: Secondary | ICD-10-CM | POA: Diagnosis not present

## 2016-05-25 DIAGNOSIS — I5022 Chronic systolic (congestive) heart failure: Secondary | ICD-10-CM | POA: Diagnosis not present

## 2016-06-01 ENCOUNTER — Other Ambulatory Visit: Payer: Self-pay | Admitting: *Deleted

## 2016-06-01 MED ORDER — WARFARIN SODIUM 3 MG PO TABS: ORAL_TABLET | ORAL | 0 refills | Status: AC

## 2016-06-15 DIAGNOSIS — I5022 Chronic systolic (congestive) heart failure: Secondary | ICD-10-CM | POA: Diagnosis not present

## 2016-06-15 DIAGNOSIS — E119 Type 2 diabetes mellitus without complications: Secondary | ICD-10-CM | POA: Diagnosis not present

## 2016-06-15 DIAGNOSIS — E6609 Other obesity due to excess calories: Secondary | ICD-10-CM | POA: Diagnosis not present

## 2016-06-22 DIAGNOSIS — Z6837 Body mass index (BMI) 37.0-37.9, adult: Secondary | ICD-10-CM | POA: Diagnosis not present

## 2016-06-22 DIAGNOSIS — E6609 Other obesity due to excess calories: Secondary | ICD-10-CM | POA: Diagnosis not present

## 2016-06-22 DIAGNOSIS — E119 Type 2 diabetes mellitus without complications: Secondary | ICD-10-CM | POA: Diagnosis not present

## 2016-06-29 DIAGNOSIS — E119 Type 2 diabetes mellitus without complications: Secondary | ICD-10-CM | POA: Diagnosis not present

## 2016-06-29 DIAGNOSIS — E6609 Other obesity due to excess calories: Secondary | ICD-10-CM | POA: Diagnosis not present

## 2016-06-29 DIAGNOSIS — Z6837 Body mass index (BMI) 37.0-37.9, adult: Secondary | ICD-10-CM | POA: Diagnosis not present

## 2016-07-06 DIAGNOSIS — E119 Type 2 diabetes mellitus without complications: Secondary | ICD-10-CM | POA: Diagnosis not present

## 2016-07-06 DIAGNOSIS — E6609 Other obesity due to excess calories: Secondary | ICD-10-CM | POA: Diagnosis not present

## 2016-07-06 DIAGNOSIS — Z6836 Body mass index (BMI) 36.0-36.9, adult: Secondary | ICD-10-CM | POA: Diagnosis not present

## 2016-07-13 DIAGNOSIS — Z6836 Body mass index (BMI) 36.0-36.9, adult: Secondary | ICD-10-CM | POA: Diagnosis not present

## 2016-07-13 DIAGNOSIS — Z5181 Encounter for therapeutic drug level monitoring: Secondary | ICD-10-CM | POA: Diagnosis not present

## 2016-07-13 DIAGNOSIS — E6609 Other obesity due to excess calories: Secondary | ICD-10-CM | POA: Diagnosis not present

## 2016-07-13 DIAGNOSIS — I482 Chronic atrial fibrillation: Secondary | ICD-10-CM | POA: Diagnosis not present

## 2016-07-13 DIAGNOSIS — E119 Type 2 diabetes mellitus without complications: Secondary | ICD-10-CM | POA: Diagnosis not present

## 2016-07-25 ENCOUNTER — Other Ambulatory Visit: Payer: Self-pay | Admitting: Cardiology

## 2016-07-27 DIAGNOSIS — Z6836 Body mass index (BMI) 36.0-36.9, adult: Secondary | ICD-10-CM | POA: Diagnosis not present

## 2016-07-27 DIAGNOSIS — E119 Type 2 diabetes mellitus without complications: Secondary | ICD-10-CM | POA: Diagnosis not present

## 2016-07-27 DIAGNOSIS — E6609 Other obesity due to excess calories: Secondary | ICD-10-CM | POA: Diagnosis not present

## 2016-08-10 DIAGNOSIS — Z7901 Long term (current) use of anticoagulants: Secondary | ICD-10-CM | POA: Diagnosis not present

## 2016-08-10 DIAGNOSIS — E6609 Other obesity due to excess calories: Secondary | ICD-10-CM | POA: Diagnosis not present

## 2016-08-10 DIAGNOSIS — I482 Chronic atrial fibrillation: Secondary | ICD-10-CM | POA: Diagnosis not present

## 2016-08-10 DIAGNOSIS — Z6837 Body mass index (BMI) 37.0-37.9, adult: Secondary | ICD-10-CM | POA: Diagnosis not present

## 2016-08-10 DIAGNOSIS — E119 Type 2 diabetes mellitus without complications: Secondary | ICD-10-CM | POA: Diagnosis not present

## 2016-08-18 DIAGNOSIS — E1042 Type 1 diabetes mellitus with diabetic polyneuropathy: Secondary | ICD-10-CM | POA: Diagnosis not present

## 2016-08-18 DIAGNOSIS — M7751 Other enthesopathy of right foot: Secondary | ICD-10-CM | POA: Diagnosis not present

## 2016-08-31 DIAGNOSIS — Z6836 Body mass index (BMI) 36.0-36.9, adult: Secondary | ICD-10-CM | POA: Diagnosis not present

## 2016-08-31 DIAGNOSIS — E119 Type 2 diabetes mellitus without complications: Secondary | ICD-10-CM | POA: Diagnosis not present

## 2016-08-31 DIAGNOSIS — E6609 Other obesity due to excess calories: Secondary | ICD-10-CM | POA: Diagnosis not present

## 2016-09-21 DIAGNOSIS — Z6836 Body mass index (BMI) 36.0-36.9, adult: Secondary | ICD-10-CM | POA: Diagnosis not present

## 2016-09-21 DIAGNOSIS — E119 Type 2 diabetes mellitus without complications: Secondary | ICD-10-CM | POA: Diagnosis not present

## 2016-09-21 DIAGNOSIS — E6609 Other obesity due to excess calories: Secondary | ICD-10-CM | POA: Diagnosis not present

## 2016-09-21 DIAGNOSIS — Z7901 Long term (current) use of anticoagulants: Secondary | ICD-10-CM | POA: Diagnosis not present

## 2016-10-05 ENCOUNTER — Encounter (HOSPITAL_COMMUNITY): Payer: Self-pay | Admitting: Emergency Medicine

## 2016-10-05 ENCOUNTER — Emergency Department (HOSPITAL_COMMUNITY)
Admission: EM | Admit: 2016-10-05 | Discharge: 2016-10-05 | Disposition: A | Discharge status: Home / Self Care | Payer: PPO | Attending: Emergency Medicine | Admitting: Emergency Medicine

## 2016-10-05 DIAGNOSIS — Z7982 Long term (current) use of aspirin: Secondary | ICD-10-CM | POA: Diagnosis not present

## 2016-10-05 DIAGNOSIS — I110 Hypertensive heart disease with heart failure: Secondary | ICD-10-CM | POA: Insufficient documentation

## 2016-10-05 DIAGNOSIS — E039 Hypothyroidism, unspecified: Secondary | ICD-10-CM | POA: Insufficient documentation

## 2016-10-05 DIAGNOSIS — I5032 Chronic diastolic (congestive) heart failure: Secondary | ICD-10-CM | POA: Insufficient documentation

## 2016-10-05 DIAGNOSIS — R062 Wheezing: Secondary | ICD-10-CM | POA: Insufficient documentation

## 2016-10-05 DIAGNOSIS — E119 Type 2 diabetes mellitus without complications: Secondary | ICD-10-CM | POA: Insufficient documentation

## 2016-10-05 DIAGNOSIS — Z794 Long term (current) use of insulin: Secondary | ICD-10-CM | POA: Insufficient documentation

## 2016-10-05 DIAGNOSIS — R040 Epistaxis: Secondary | ICD-10-CM | POA: Insufficient documentation

## 2016-10-05 DIAGNOSIS — Z7901 Long term (current) use of anticoagulants: Secondary | ICD-10-CM | POA: Diagnosis not present

## 2016-10-05 DIAGNOSIS — Z79899 Other long term (current) drug therapy: Secondary | ICD-10-CM | POA: Insufficient documentation

## 2016-10-05 LAB — PROTIME-INR
INR: 2.3
PROTHROMBIN TIME: 25.7 s — AB (ref 11.4–15.2)

## 2016-10-05 LAB — CBC
HCT: 38.7 % (ref 36.0–46.0)
Hemoglobin: 12.5 g/dL (ref 12.0–15.0)
MCH: 28.3 pg (ref 26.0–34.0)
MCHC: 32.3 g/dL (ref 30.0–36.0)
MCV: 87.8 fL (ref 78.0–100.0)
PLATELETS: 178 10*3/uL (ref 150–400)
RBC: 4.41 MIL/uL (ref 3.87–5.11)
RDW: 14.3 % (ref 11.5–15.5)
WBC: 10.7 10*3/uL — AB (ref 4.0–10.5)

## 2016-10-05 LAB — CBG MONITORING, ED: Glucose-Capillary: 253 mg/dL — ABNORMAL HIGH (ref 65–99)

## 2016-10-05 MED ORDER — OXYMETAZOLINE HCL 0.05 % NA SOLN
1.0000 | Freq: Once | NASAL | Status: AC
  Administered 2016-10-05: 1 via NASAL
  Filled 2016-10-05: qty 15

## 2016-10-05 MED ORDER — AZITHROMYCIN 250 MG PO TABS: 250.0000 mg | ORAL_TABLET | Freq: Every day | ORAL | 0 refills | Status: DC

## 2016-10-05 MED ORDER — LIDOCAINE HCL (PF) 2 % IJ SOLN
INTRAMUSCULAR | Status: AC
  Filled 2016-10-05: qty 10

## 2016-10-05 NOTE — ED Notes (Addendum)
ED Provider at bedside. Pt has packing in place to control bleeding.

## 2016-10-05 NOTE — ED Provider Notes (Signed)
[facility] Provider Note   CSN: [id number] Arrival date & time: 10/05/16  1554     History   Chief Complaint Chief Complaint  Patient presents with  . Epistaxis    HPI Amber Armstrong is a 65 y.o. female.  HPI Patient presented to the emergency room with complaints of nasal bleeding. Patient states she started having bleeding suddenly at about 2 PM today. She attempted to squeeze her nose and apply pressure but was unsuccessful in getting the bleeding to stop. Bleeding has primarily been coming from her left nares. She denies any other sources of bleeding. She does take Coumadin regularly but has never had episodes of nasal bleeding like this in the past. She denies any weakness. No lightheadedness. No chest pain or shortness of breath. Past Medical History:  Diagnosis Date  . Atrial fibrillation (HCC)   . Chronic diastolic heart failure (HCC)   . Hyperglycemia without ketosis 03/17/2015  . Hypertension   . Hypothyroidism (acquired)   . Nephrolithiasis   . Overweight(278.02)   . Right ventricular hypertrophy 12/12/2012  . Type 2 diabetes mellitus (HCC)    Type II  . UTI (urinary tract infection)     Patient Active Problem List   Diagnosis Date Noted  . Acute kidney injury (HCC) 03/18/2015  . UTI (lower urinary tract infection) 03/17/2015  . Hyponatremia 03/17/2015  . Hyperglycemia without ketosis 03/17/2015  . Dehydration 03/17/2015  . Hyperglycemia due to type 2 diabetes mellitus (HCC)   . Encounter for therapeutic drug monitoring 03/06/2014  . Wound infection after surgery 07/01/2013  . Chronic diastolic heart failure (HCC) 07/01/2013  . Cellulitis of right groin 07/01/2013  . Hematoma of groin 04/20/2013  . Pseudoaneurysm (HCC) 04/20/2013  . Acute blood loss anemia 04/20/2013  . Hemorrhagic shock 04/20/2013  . Acute diastolic heart failure (HCC) 04/15/2013  . Acute respiratory failure (HCC) 04/14/2013  . Right ventricular hypertrophy 12/12/2012  .  Bilateral lower extremity edema 03/28/2012  . Long term current use of anticoagulant 02/01/2011  . HYPERLIPIDEMIA 06/14/2010  . HYPOKALEMIA, MILD 04/02/2010  . Morbid obesity with BMI of 40.0-44.9, adult (HCC) 05/05/2009  . Essential hypertension 05/05/2009  . FIBRILLATION, ATRIAL 05/05/2009  . NEPHROLITHIASIS 05/05/2009  . UTI 05/05/2009  . THYROIDECTOMY, HX OF 05/05/2009    Past Surgical History:  Procedure Laterality Date  . GROIN DISSECTION Right 04/20/2013   Procedure: GROIN EXPLORATION;  Surgeon: Fabio Bering, MD;  Location: [facility];  Service: General;  Laterality: Right;  . THYROIDECTOMY      OB History    No data available       Home Medications    Prior to Admission medications   Medication Sig Start Date End Date Taking? Authorizing Provider  glimepiride (AMARYL) 2 MG tablet Take 1 mg by mouth daily before breakfast.    Yes Historical Provider, MD  acetaminophen (TYLENOL) 500 MG tablet Take 500 mg by mouth every 6 (six) hours as needed for pain.    Historical Provider, MD  aspirin EC 81 MG tablet Take 81 mg by mouth daily.    Historical Provider, MD  azithromycin (ZITHROMAX) 250 MG tablet Take 1 tablet (250 mg total) by mouth daily. Take first 2 tablets together, then 1 every day until finished. 10/05/16   Linwood Dibbles, MD  furosemide (LASIX) 40 MG tablet Take 20 mg by mouth 2 (two) times daily.  06/17/13   Jodelle Gross, NP  insulin glargine (LANTUS) 100 UNIT/ML injection Inject 0.15 mLs (15 Units  total) into the skin at bedtime. Patient taking differently: Inject 20 Units into the skin at bedtime.  03/18/15   Gwenyth Bender, NP  metoprolol (LOPRESSOR) 50 MG tablet Take 1.5 tablets (75 mg total) by mouth 2 (two) times daily. 10/14/15   Antoine Poche, MD  spironolactone (ALDACTONE) 25 MG tablet Take 12.5 mg by mouth daily.  06/17/13   Jodelle Gross, NP  warfarin (COUMADIN) 3 MG tablet Take 1 1/2 tablets daily or as directed 06/01/16   Antoine Poche, MD     Family History Family History  Problem Relation Age of Onset  . Arrhythmia Mother     Atrial fib    Social History Social History  Substance Use Topics  . Smoking status: Never Smoker  . Smokeless tobacco: Never Used  . Alcohol use No     Allergies   Penicillins   Review of Systems Review of Systems  All other systems reviewed and are negative.    Physical Exam Updated Vital Signs BP 147/88   Pulse 89   Temp 97.5 F (36.4 C) (Temporal)   Resp 16   Ht 5' (1.524 m)   Wt 83 kg   SpO2 95%   BMI 35.74 kg/m   Physical Exam  Constitutional: She appears well-developed and well-nourished. No distress.  HENT:  Head: Normocephalic and atraumatic.  Right Ear: External ear normal.  Left Ear: External ear normal.  Nose: Epistaxis (left nares, unable to determine source) is observed.  Eyes: Conjunctivae are normal. Right eye exhibits no discharge. Left eye exhibits no discharge. No scleral icterus.  Neck: Neck supple. No tracheal deviation present.  Cardiovascular: Normal rate, regular rhythm and normal heart sounds.   Pulmonary/Chest: Effort normal. No stridor. No respiratory distress. She has wheezes. She has rales.  Abdominal: She exhibits no distension.  Musculoskeletal: She exhibits no edema.  Neurological: She is alert. Cranial nerve deficit: no gross deficits.  Skin: Skin is warm and dry. No rash noted.  Psychiatric: She has a normal mood and affect.  Nursing note and vitals reviewed.    ED Treatments / Results  Labs (all labs ordered are listed, but only abnormal results are displayed) Labs Reviewed  CBC - Abnormal; Notable for the following:       Result Value   WBC 10.7 (*)    All other components within normal limits  PROTIME-INR - Abnormal; Notable for the following:    Prothrombin Time 25.7 (*)    All other components within normal limits  CBG MONITORING, ED - Abnormal; Notable for the following:    Glucose-Capillary 253 (*)    All other  components within normal limits   Procedures .Epistaxis Management Date/Time: 10/05/2016 6:18 PM Performed by: Linwood Dibbles Authorized by: Linwood Dibbles   Consent:    Consent obtained:  Verbal Procedure details:    Treatment site:  L anterior   Treatment method:  Nasal tampon   Treatment complexity:  Extensive   Treatment episode: initial   Post-procedure details:    Assessment:  Bleeding stopped   Patient tolerance of procedure:  Tolerated well, no immediate complications Comments:     It took several attempts.  Initial attempt at nasal suctioning and having the patient clear her nose of clots was unsuccessful.  Attempted to place a 7.5 cm device without success.  I was then able to place a 5.5 cm device.  Pt accidentally pulled that out.  Eventually able to place a 5.5 cm device successfully with  bleeding controlled.   (including critical care time)  Medications Ordered in ED Medications  lidocaine (XYLOCAINE) 2 % injection (not administered)  lidocaine (XYLOCAINE) 2 % injection (not administered)  oxymetazoline (AFRIN) 0.05 % nasal spray 1 spray (1 spray Each Nare Given 10/05/16 1743)     Initial Impression / Assessment and Plan / ED Course  I have reviewed the triage vital signs and the nursing notes.  Pertinent labs & imaging results that were available during my care of the patient were reviewed by me and considered in my medical decision making (see chart for details).  Clinical Course     Patient was treated with anterior nasal packing. Bleeding was controlled. Patient was monitored in the emergency room and she had no further episodes of bleeding. Laboratory tests are reassuring. We'll discharge home with outpatient ENT follow-up.  Azithromycin prescription for prophylaxis.  Final Clinical Impressions(s) / ED Diagnoses   Final diagnoses:  Epistaxis    New Prescriptions New Prescriptions   AZITHROMYCIN (ZITHROMAX) 250 MG TABLET    Take 1 tablet (250 mg total) by  mouth daily. Take first 2 tablets together, then 1 every day until finished.     Linwood DibblesJon Moni Rothrock, MD 10/05/16 [phone]

## 2016-10-05 NOTE — ED Triage Notes (Signed)
Patient complains of nose bleed that started at 1400 today. Patient states she takes Warfarin. Bleeding uncontrolled in triage.

## 2016-10-05 NOTE — ED Notes (Signed)
cbg of 253.

## 2016-10-05 NOTE — ED Notes (Signed)
ED Provider at bedside. 

## 2016-10-06 ENCOUNTER — Encounter (HOSPITAL_COMMUNITY): Payer: Self-pay

## 2016-10-06 ENCOUNTER — Emergency Department (HOSPITAL_COMMUNITY)
Admission: EM | Admit: 2016-10-06 | Discharge: 2016-10-06 | Disposition: A | Discharge status: Home / Self Care | Payer: PPO | Attending: Emergency Medicine | Admitting: Emergency Medicine

## 2016-10-06 DIAGNOSIS — E039 Hypothyroidism, unspecified: Secondary | ICD-10-CM | POA: Diagnosis not present

## 2016-10-06 DIAGNOSIS — Z794 Long term (current) use of insulin: Secondary | ICD-10-CM | POA: Insufficient documentation

## 2016-10-06 DIAGNOSIS — I110 Hypertensive heart disease with heart failure: Secondary | ICD-10-CM | POA: Diagnosis not present

## 2016-10-06 DIAGNOSIS — E1165 Type 2 diabetes mellitus with hyperglycemia: Secondary | ICD-10-CM | POA: Insufficient documentation

## 2016-10-06 DIAGNOSIS — Z7901 Long term (current) use of anticoagulants: Secondary | ICD-10-CM | POA: Diagnosis not present

## 2016-10-06 DIAGNOSIS — I5032 Chronic diastolic (congestive) heart failure: Secondary | ICD-10-CM | POA: Insufficient documentation

## 2016-10-06 DIAGNOSIS — R040 Epistaxis: Secondary | ICD-10-CM | POA: Diagnosis not present

## 2016-10-06 DIAGNOSIS — Z7982 Long term (current) use of aspirin: Secondary | ICD-10-CM | POA: Insufficient documentation

## 2016-10-06 DIAGNOSIS — R739 Hyperglycemia, unspecified: Secondary | ICD-10-CM

## 2016-10-06 LAB — CBC WITH DIFFERENTIAL/PLATELET
BASOS ABS: 0 10*3/uL (ref 0.0–0.1)
BASOS PCT: 0 %
Eosinophils Absolute: 0 10*3/uL (ref 0.0–0.7)
Eosinophils Relative: 0 %
HEMATOCRIT: 33.7 % — AB (ref 36.0–46.0)
HEMOGLOBIN: 10.8 g/dL — AB (ref 12.0–15.0)
LYMPHS PCT: 9 %
Lymphs Abs: 1 10*3/uL (ref 0.7–4.0)
MCH: 28.1 pg (ref 26.0–34.0)
MCHC: 32 g/dL (ref 30.0–36.0)
MCV: 87.5 fL (ref 78.0–100.0)
MONO ABS: 1.4 10*3/uL — AB (ref 0.1–1.0)
Monocytes Relative: 12 %
NEUTROS ABS: 9.6 10*3/uL — AB (ref 1.7–7.7)
NEUTROS PCT: 79 %
Platelets: 193 10*3/uL (ref 150–400)
RBC: 3.85 MIL/uL — ABNORMAL LOW (ref 3.87–5.11)
RDW: 14.3 % (ref 11.5–15.5)
WBC: 12.1 10*3/uL — ABNORMAL HIGH (ref 4.0–10.5)

## 2016-10-06 LAB — BASIC METABOLIC PANEL
ANION GAP: 10 (ref 5–15)
BUN: 68 mg/dL — ABNORMAL HIGH (ref 6–20)
CALCIUM: 8.7 mg/dL — AB (ref 8.9–10.3)
CHLORIDE: 106 mmol/L (ref 101–111)
CO2: 26 mmol/L (ref 22–32)
Creatinine, Ser: 1.4 mg/dL — ABNORMAL HIGH (ref 0.44–1.00)
GFR calc non Af Amer: 38 mL/min — ABNORMAL LOW (ref >60)
GFR, EST AFRICAN AMERICAN: 44 mL/min — AB (ref >60)
GLUCOSE: 451 mg/dL — AB (ref 65–99)
POTASSIUM: 4.4 mmol/L (ref 3.5–5.1)
Sodium: 142 mmol/L (ref 135–145)

## 2016-10-06 LAB — CBG MONITORING, ED
GLUCOSE-CAPILLARY: 405 mg/dL — AB (ref 65–99)
GLUCOSE-CAPILLARY: 416 mg/dL — AB (ref 65–99)
GLUCOSE-CAPILLARY: 425 mg/dL — AB (ref 65–99)
GLUCOSE-CAPILLARY: 462 mg/dL — AB (ref 65–99)

## 2016-10-06 LAB — PROTIME-INR
INR: 2.29
Prothrombin Time: 25.6 seconds — ABNORMAL HIGH (ref 11.4–15.2)

## 2016-10-06 MED ORDER — INSULIN ASPART 100 UNIT/ML ~~LOC~~ SOLN
8.0000 [IU] | Freq: Once | SUBCUTANEOUS | Status: AC
  Administered 2016-10-06: 8 [IU] via SUBCUTANEOUS
  Filled 2016-10-06: qty 1

## 2016-10-06 MED ORDER — LIDOCAINE-EPINEPHRINE (PF) 2 %-1:200000 IJ SOLN
20.0000 mL | Freq: Once | INTRAMUSCULAR | Status: AC
  Administered 2016-10-06: 20 mL
  Filled 2016-10-06: qty 20

## 2016-10-06 MED ORDER — INSULIN GLARGINE 100 UNIT/ML ~~LOC~~ SOLN
20.0000 [IU] | Freq: Once | SUBCUTANEOUS | Status: AC
  Administered 2016-10-06: 20 [IU] via SUBCUTANEOUS
  Filled 2016-10-06: qty 0.2

## 2016-10-06 MED ORDER — TRANEXAMIC ACID 1000 MG/10ML IV SOLN
500.0000 mg | Freq: Once | INTRAVENOUS | Status: DC
  Filled 2016-10-06: qty 10

## 2016-10-06 NOTE — ED Notes (Signed)
Patient states that she is on Coumadin at home.  States that she did not take her coumadin tonight.

## 2016-10-06 NOTE — ED Provider Notes (Signed)
[facility] Provider Note   CSN: [id number] Arrival date & time: 10/06/16  4098 By signing my name below, I, Bridgette Habermann, attest that this documentation has been prepared under the direction and in the presence of Glynn Octave, MD. Electronically Signed: Bridgette Habermann, ED Scribe. 10/06/16. 1:03 AM.  History   Chief Complaint Chief Complaint  Patient presents with  . Epistaxis   HPI Comments: Amber Armstrong is a 65 y.o. female with h/o A-fib, CHF on Coumadin, HTN, and DM who presents to the Emergency Department complaining of epistaxis that began just PTA. Pt was seen here yesterday for epistaxis that began around 2 pm. She had placed a 5.5 cm anterior nasal packing that stopped bleeding. Pt states she vomited just PTA which caused the packing to come out and her nose to continue bleeding. Denies h/o similar symptoms prior to her episode yesterday. Pt is on Coumadin but notes that she did not take her prescribed amount today. She denies fever, chills, or any other associated symptoms.   The history is provided by the patient. No language interpreter was used.   Past Medical History:  Diagnosis Date  . Atrial fibrillation (HCC)   . Chronic diastolic heart failure (HCC)   . Hyperglycemia without ketosis 03/17/2015  . Hypertension   . Hypothyroidism (acquired)   . Nephrolithiasis   . Overweight(278.02)   . Right ventricular hypertrophy 12/12/2012  . Type 2 diabetes mellitus (HCC)    Type II  . UTI (urinary tract infection)     Patient Active Problem List   Diagnosis Date Noted  . Acute kidney injury (HCC) 03/18/2015  . UTI (lower urinary tract infection) 03/17/2015  . Hyponatremia 03/17/2015  . Hyperglycemia without ketosis 03/17/2015  . Dehydration 03/17/2015  . Hyperglycemia due to type 2 diabetes mellitus (HCC)   . Encounter for therapeutic drug monitoring 03/06/2014  . Wound infection after surgery 07/01/2013  . Chronic diastolic heart failure (HCC) 07/01/2013  .  Cellulitis of right groin 07/01/2013  . Hematoma of groin 04/20/2013  . Pseudoaneurysm (HCC) 04/20/2013  . Acute blood loss anemia 04/20/2013  . Hemorrhagic shock 04/20/2013  . Acute diastolic heart failure (HCC) 04/15/2013  . Acute respiratory failure (HCC) 04/14/2013  . Right ventricular hypertrophy 12/12/2012  . Bilateral lower extremity edema 03/28/2012  . Long term current use of anticoagulant 02/01/2011  . HYPERLIPIDEMIA 06/14/2010  . HYPOKALEMIA, MILD 04/02/2010  . Morbid obesity with BMI of 40.0-44.9, adult (HCC) 05/05/2009  . Essential hypertension 05/05/2009  . FIBRILLATION, ATRIAL 05/05/2009  . NEPHROLITHIASIS 05/05/2009  . UTI 05/05/2009  . THYROIDECTOMY, HX OF 05/05/2009    Past Surgical History:  Procedure Laterality Date  . GROIN DISSECTION Right 04/20/2013   Procedure: GROIN EXPLORATION;  Surgeon: Fabio Bering, MD;  Location: [facility];  Service: General;  Laterality: Right;  . THYROIDECTOMY      OB History    No data available       Home Medications    Prior to Admission medications   Medication Sig Start Date End Date Taking? Authorizing Provider  acetaminophen (TYLENOL) 500 MG tablet Take 500 mg by mouth every 6 (six) hours as needed for pain.    Historical Provider, MD  aspirin EC 81 MG tablet Take 81 mg by mouth daily.    Historical Provider, MD  azithromycin (ZITHROMAX) 250 MG tablet Take 1 tablet (250 mg total) by mouth daily. Take first 2 tablets together, then 1 every day until finished. 10/05/16   Linwood Dibbles, MD  furosemide (LASIX) 40 MG tablet Take 20 mg by mouth 2 (two) times daily.  06/17/13   Jodelle Gross, NP  glimepiride (AMARYL) 2 MG tablet Take 1 mg by mouth daily before breakfast.     Historical Provider, MD  insulin glargine (LANTUS) 100 UNIT/ML injection Inject 0.15 mLs (15 Units total) into the skin at bedtime. Patient taking differently: Inject 20 Units into the skin at bedtime.  03/18/15   Gwenyth Bender, NP  metoprolol (LOPRESSOR)  50 MG tablet Take 1.5 tablets (75 mg total) by mouth 2 (two) times daily. 10/14/15   Antoine Poche, MD  spironolactone (ALDACTONE) 25 MG tablet Take 12.5 mg by mouth daily.  06/17/13   Jodelle Gross, NP  warfarin (COUMADIN) 3 MG tablet Take 1 1/2 tablets daily or as directed 06/01/16   Antoine Poche, MD    Family History Family History  Problem Relation Age of Onset  . Arrhythmia Mother     Atrial fib    Social History Social History  Substance Use Topics  . Smoking status: Never Smoker  . Smokeless tobacco: Never Used  . Alcohol use No     Allergies   Penicillins   Review of Systems Review of Systems 10 Systems reviewed and all are negative for acute change except as noted in the HPI. Physical Exam Updated Vital Signs BP 144/84 (BP Location: Right Arm)   Pulse 89   Temp 98 F (36.7 C)   Resp 20   Ht 5' (1.524 m)   Wt 183 lb (83 kg)   SpO2 98%   BMI 35.74 kg/m   Physical Exam  Constitutional: She is oriented to person, place, and time. She appears well-developed and well-nourished.  Appears uncomfortable.  HENT:  Head: Normocephalic and atraumatic.  Nose: Epistaxis is observed.  Mouth/Throat: Oropharynx is clear and moist. No oropharyngeal exudate.  Active bleeding from left nare. Unable to visualize source.  Eyes: Conjunctivae and EOM are normal. Pupils are equal, round, and reactive to light.  Neck: Normal range of motion. Neck supple.  No meningismus.  Cardiovascular: Normal rate, regular rhythm, normal heart sounds and intact distal pulses.   No murmur heard. Pulmonary/Chest: Effort normal and breath sounds normal. No respiratory distress.  Abdominal: Soft. There is no tenderness. There is no rebound and no guarding.  Musculoskeletal: Normal range of motion. She exhibits no edema or tenderness.  Neurological: She is alert and oriented to person, place, and time. No cranial nerve deficit. She exhibits normal muscle tone. Coordination normal.    5/5 strength throughout. CN 2-12 intact.Equal grip strength.   Skin: Skin is warm.  Psychiatric: She has a normal mood and affect. Her behavior is normal.  Nursing note and vitals reviewed.    ED Treatments / Results  DIAGNOSTIC STUDIES: Oxygen Saturation is 98% on RA, normal by my interpretation.    COORDINATION OF CARE: 12:55 AM Discussed treatment plan with pt at bedside and pt agreed to plan.  Labs (all labs ordered are listed, but only abnormal results are displayed) Labs Reviewed  CBC WITH DIFFERENTIAL/PLATELET - Abnormal; Notable for the following:       Result Value   WBC 12.1 (*)    RBC 3.85 (*)    Hemoglobin 10.8 (*)    HCT 33.7 (*)    Neutro Abs 9.6 (*)    Monocytes Absolute 1.4 (*)    All other components within normal limits  BASIC METABOLIC PANEL - Abnormal; Notable for the following:  Glucose, Bld 451 (*)    BUN 68 (*)    Creatinine, Ser 1.40 (*)    Calcium 8.7 (*)    GFR calc non Af Amer 38 (*)    GFR calc Af Amer 44 (*)    All other components within normal limits  CBG MONITORING, ED - Abnormal; Notable for the following:    Glucose-Capillary 416 (*)    All other components within normal limits  PROTIME-INR    EKG  EKG Interpretation None       Radiology No results found.  Procedures .Epistaxis Management Date/Time: 10/06/2016 12:53 AM Performed by: Glynn OctaveANCOUR, Jayron Maqueda Authorized by: Glynn OctaveANCOUR, Kimo Bancroft   Consent:    Consent obtained:  Verbal   Consent given by:  Patient Procedure details:    Treatment site:  L anterior and L posterior   Treatment method:  Nasal tampon and thrombin   Treatment complexity:  Extensive   Treatment episode: recurring   Post-procedure details:    Assessment:  Bleeding decreased   Patient tolerance of procedure:  Tolerated well, no immediate complications      Medications Ordered in ED Medications  tranexamic acid (CYKLOKAPRON) injection 500 mg (not administered)  lidocaine-EPINEPHrine (XYLOCAINE  W/EPI) 2 %-1:200000 (PF) injection 20 mL (not administered)     Initial Impression / Assessment and Plan / ED Course  I have reviewed the triage vital signs and the nursing notes.  Pertinent labs & imaging results that were available during my care of the patient were reviewed by me and considered in my medical decision making (see chart for details).  Clinical Course   Patient with recurrent nosebleed after discharge earlier this evening. States packing fell out after coughing. Denies chest pain, shortness of breath, dizziness.  Unable to visualize source of bleeding in the left side. TXA not available. Patient packed with 7.5 cm Rhino Rocket.  Hemoglobin has decreased since previous visit as expected.   Hyperglycemia without DKA.  Patient did not take insulin tonight.  Patient observed in the ED without further recurrence of bleeding.  No blood in posterior pharynx. No CP or SOB. No dizziness or lightheadedness.  Home lantus given.  Sugar downtrending to 405. Normal anion gap. Patient also given aspart insulin in the ED. Advised to monitor blood sugar closely today at home, no additional insulin until tonight. Followup with PCP and ENT. Return precautions discussed.  Final Clinical Impressions(s) / ED Diagnoses   Final diagnoses:  Epistaxis  Hyperglycemia    New Prescriptions New Prescriptions   No medications on file  I personally performed the services described in this documentation, which was scribed in my presence. The recorded information has been reviewed and is accurate.    Glynn OctaveStephen Valon Glasscock, MD 10/06/16 [phone]

## 2016-10-06 NOTE — Discharge Instructions (Signed)
Take your medications as prescribed. Followup with the ENT doctor. Return to the ED if you are unable to control any nosebleed at home with pressure.

## 2016-10-06 NOTE — ED Notes (Signed)
Pt's left nare continues to bleed an very small amount of blood packing in place pt's left eye has stopped having blood coming from her tear duct dr in to exam the pt he assess her throat for blood running down the back of her throat none noted per the doctor pt was okayed to have ice and water to drink will continue to monitor her bleeding closely

## 2016-10-06 NOTE — ED Triage Notes (Signed)
Patient states that her nose bleed started around 2 pm yesterday and she came here.  They packed her nose and sent her home.  When she was at home her packing came out when she started vomiting, and her nose is still bleeding.

## 2016-10-10 ENCOUNTER — Ambulatory Visit (INDEPENDENT_AMBULATORY_CARE_PROVIDER_SITE_OTHER): Payer: PPO | Admitting: Otolaryngology

## 2016-10-10 DIAGNOSIS — R040 Epistaxis: Secondary | ICD-10-CM

## 2016-10-12 DIAGNOSIS — E6609 Other obesity due to excess calories: Secondary | ICD-10-CM | POA: Diagnosis not present

## 2016-10-12 DIAGNOSIS — Z6836 Body mass index (BMI) 36.0-36.9, adult: Secondary | ICD-10-CM | POA: Diagnosis not present

## 2016-10-12 DIAGNOSIS — E119 Type 2 diabetes mellitus without complications: Secondary | ICD-10-CM | POA: Diagnosis not present

## 2016-10-19 DIAGNOSIS — E119 Type 2 diabetes mellitus without complications: Secondary | ICD-10-CM | POA: Diagnosis not present

## 2016-10-19 DIAGNOSIS — E785 Hyperlipidemia, unspecified: Secondary | ICD-10-CM | POA: Diagnosis not present

## 2016-10-19 DIAGNOSIS — I482 Chronic atrial fibrillation: Secondary | ICD-10-CM | POA: Diagnosis not present

## 2016-10-19 DIAGNOSIS — I5022 Chronic systolic (congestive) heart failure: Secondary | ICD-10-CM | POA: Diagnosis not present

## 2016-10-19 DIAGNOSIS — E039 Hypothyroidism, unspecified: Secondary | ICD-10-CM | POA: Diagnosis not present

## 2016-10-26 DIAGNOSIS — E119 Type 2 diabetes mellitus without complications: Secondary | ICD-10-CM | POA: Diagnosis not present

## 2016-10-26 DIAGNOSIS — Z7901 Long term (current) use of anticoagulants: Secondary | ICD-10-CM | POA: Diagnosis not present

## 2016-10-26 DIAGNOSIS — E6609 Other obesity due to excess calories: Secondary | ICD-10-CM | POA: Diagnosis not present

## 2016-10-26 DIAGNOSIS — Z6836 Body mass index (BMI) 36.0-36.9, adult: Secondary | ICD-10-CM | POA: Diagnosis not present

## 2016-10-26 DIAGNOSIS — I482 Chronic atrial fibrillation: Secondary | ICD-10-CM | POA: Diagnosis not present

## 2016-11-02 DIAGNOSIS — Z7901 Long term (current) use of anticoagulants: Secondary | ICD-10-CM | POA: Diagnosis not present

## 2016-11-02 DIAGNOSIS — I482 Chronic atrial fibrillation: Secondary | ICD-10-CM | POA: Diagnosis not present

## 2016-11-08 DIAGNOSIS — E119 Type 2 diabetes mellitus without complications: Secondary | ICD-10-CM | POA: Diagnosis not present

## 2016-11-08 DIAGNOSIS — E6609 Other obesity due to excess calories: Secondary | ICD-10-CM | POA: Diagnosis not present

## 2016-11-08 DIAGNOSIS — Z7901 Long term (current) use of anticoagulants: Secondary | ICD-10-CM | POA: Diagnosis not present

## 2016-11-08 DIAGNOSIS — I482 Chronic atrial fibrillation: Secondary | ICD-10-CM | POA: Diagnosis not present

## 2016-11-08 DIAGNOSIS — Z6836 Body mass index (BMI) 36.0-36.9, adult: Secondary | ICD-10-CM | POA: Diagnosis not present

## 2016-11-10 ENCOUNTER — Ambulatory Visit (INDEPENDENT_AMBULATORY_CARE_PROVIDER_SITE_OTHER): Payer: PPO | Admitting: Otolaryngology

## 2016-12-07 DIAGNOSIS — E039 Hypothyroidism, unspecified: Secondary | ICD-10-CM | POA: Diagnosis not present

## 2016-12-07 DIAGNOSIS — E119 Type 2 diabetes mellitus without complications: Secondary | ICD-10-CM | POA: Diagnosis not present

## 2016-12-07 DIAGNOSIS — I482 Chronic atrial fibrillation: Secondary | ICD-10-CM | POA: Diagnosis not present

## 2016-12-07 DIAGNOSIS — I5022 Chronic systolic (congestive) heart failure: Secondary | ICD-10-CM | POA: Diagnosis not present

## 2017-01-05 DIAGNOSIS — Z7901 Long term (current) use of anticoagulants: Secondary | ICD-10-CM | POA: Diagnosis not present

## 2017-01-05 DIAGNOSIS — I482 Chronic atrial fibrillation: Secondary | ICD-10-CM | POA: Diagnosis not present

## 2017-01-17 DIAGNOSIS — I482 Chronic atrial fibrillation: Secondary | ICD-10-CM | POA: Diagnosis not present

## 2017-01-17 DIAGNOSIS — Z7901 Long term (current) use of anticoagulants: Secondary | ICD-10-CM | POA: Diagnosis not present

## 2017-02-20 DIAGNOSIS — I482 Chronic atrial fibrillation: Secondary | ICD-10-CM | POA: Diagnosis not present

## 2017-02-20 DIAGNOSIS — Z7901 Long term (current) use of anticoagulants: Secondary | ICD-10-CM | POA: Diagnosis not present

## 2017-04-19 DIAGNOSIS — I5022 Chronic systolic (congestive) heart failure: Secondary | ICD-10-CM | POA: Diagnosis not present

## 2017-04-19 DIAGNOSIS — E119 Type 2 diabetes mellitus without complications: Secondary | ICD-10-CM | POA: Diagnosis not present

## 2017-04-19 DIAGNOSIS — I119 Hypertensive heart disease without heart failure: Secondary | ICD-10-CM | POA: Diagnosis not present

## 2017-04-19 DIAGNOSIS — E785 Hyperlipidemia, unspecified: Secondary | ICD-10-CM | POA: Diagnosis not present

## 2017-04-19 DIAGNOSIS — I482 Chronic atrial fibrillation: Secondary | ICD-10-CM | POA: Diagnosis not present

## 2017-04-19 DIAGNOSIS — E039 Hypothyroidism, unspecified: Secondary | ICD-10-CM | POA: Diagnosis not present

## 2017-04-19 DIAGNOSIS — Z713 Dietary counseling and surveillance: Secondary | ICD-10-CM | POA: Diagnosis not present

## 2017-06-12 DIAGNOSIS — I482 Chronic atrial fibrillation: Secondary | ICD-10-CM | POA: Diagnosis not present

## 2017-06-12 DIAGNOSIS — I5022 Chronic systolic (congestive) heart failure: Secondary | ICD-10-CM | POA: Diagnosis not present

## 2017-06-12 DIAGNOSIS — Z7901 Long term (current) use of anticoagulants: Secondary | ICD-10-CM | POA: Diagnosis not present

## 2017-07-26 DIAGNOSIS — I482 Chronic atrial fibrillation: Secondary | ICD-10-CM | POA: Diagnosis not present

## 2017-07-26 DIAGNOSIS — E119 Type 2 diabetes mellitus without complications: Secondary | ICD-10-CM | POA: Diagnosis not present

## 2017-07-26 DIAGNOSIS — E785 Hyperlipidemia, unspecified: Secondary | ICD-10-CM | POA: Diagnosis not present

## 2017-07-26 DIAGNOSIS — E039 Hypothyroidism, unspecified: Secondary | ICD-10-CM | POA: Diagnosis not present

## 2017-07-26 DIAGNOSIS — I10 Essential (primary) hypertension: Secondary | ICD-10-CM | POA: Diagnosis not present

## 2017-08-07 DIAGNOSIS — N390 Urinary tract infection, site not specified: Secondary | ICD-10-CM | POA: Diagnosis not present

## 2017-08-10 ENCOUNTER — Inpatient Hospital Stay (HOSPITAL_COMMUNITY)
Admission: EM | Admit: 2017-08-10 | Discharge: 2017-08-12 | DRG: 638 | Disposition: A | Discharge status: Home / Self Care | Payer: PPO | Attending: Family Medicine | Admitting: Family Medicine

## 2017-08-10 ENCOUNTER — Emergency Department (HOSPITAL_COMMUNITY): Payer: PPO

## 2017-08-10 ENCOUNTER — Encounter (HOSPITAL_COMMUNITY): Payer: Self-pay | Admitting: Emergency Medicine

## 2017-08-10 DIAGNOSIS — E1165 Type 2 diabetes mellitus with hyperglycemia: Secondary | ICD-10-CM | POA: Diagnosis not present

## 2017-08-10 DIAGNOSIS — Z7982 Long term (current) use of aspirin: Secondary | ICD-10-CM | POA: Diagnosis not present

## 2017-08-10 DIAGNOSIS — R739 Hyperglycemia, unspecified: Secondary | ICD-10-CM | POA: Diagnosis not present

## 2017-08-10 DIAGNOSIS — Z880 Allergy status to penicillin: Secondary | ICD-10-CM

## 2017-08-10 DIAGNOSIS — E663 Overweight: Secondary | ICD-10-CM | POA: Diagnosis present

## 2017-08-10 DIAGNOSIS — E875 Hyperkalemia: Secondary | ICD-10-CM | POA: Diagnosis not present

## 2017-08-10 DIAGNOSIS — K859 Acute pancreatitis without necrosis or infection, unspecified: Secondary | ICD-10-CM | POA: Diagnosis not present

## 2017-08-10 DIAGNOSIS — E860 Dehydration: Secondary | ICD-10-CM | POA: Diagnosis not present

## 2017-08-10 DIAGNOSIS — Z7901 Long term (current) use of anticoagulants: Secondary | ICD-10-CM

## 2017-08-10 DIAGNOSIS — R112 Nausea with vomiting, unspecified: Secondary | ICD-10-CM | POA: Diagnosis not present

## 2017-08-10 DIAGNOSIS — I110 Hypertensive heart disease with heart failure: Secondary | ICD-10-CM | POA: Diagnosis present

## 2017-08-10 DIAGNOSIS — N390 Urinary tract infection, site not specified: Secondary | ICD-10-CM | POA: Diagnosis present

## 2017-08-10 DIAGNOSIS — I517 Cardiomegaly: Secondary | ICD-10-CM | POA: Diagnosis not present

## 2017-08-10 DIAGNOSIS — R0989 Other specified symptoms and signs involving the circulatory and respiratory systems: Secondary | ICD-10-CM | POA: Diagnosis not present

## 2017-08-10 DIAGNOSIS — E890 Postprocedural hypothyroidism: Secondary | ICD-10-CM | POA: Diagnosis not present

## 2017-08-10 DIAGNOSIS — Z66 Do not resuscitate: Secondary | ICD-10-CM | POA: Diagnosis not present

## 2017-08-10 DIAGNOSIS — I5032 Chronic diastolic (congestive) heart failure: Secondary | ICD-10-CM | POA: Diagnosis not present

## 2017-08-10 DIAGNOSIS — I10 Essential (primary) hypertension: Secondary | ICD-10-CM | POA: Diagnosis not present

## 2017-08-10 DIAGNOSIS — Z794 Long term (current) use of insulin: Secondary | ICD-10-CM

## 2017-08-10 DIAGNOSIS — N179 Acute kidney failure, unspecified: Secondary | ICD-10-CM | POA: Diagnosis not present

## 2017-08-10 DIAGNOSIS — Z8249 Family history of ischemic heart disease and other diseases of the circulatory system: Secondary | ICD-10-CM | POA: Diagnosis not present

## 2017-08-10 DIAGNOSIS — I4891 Unspecified atrial fibrillation: Secondary | ICD-10-CM | POA: Diagnosis not present

## 2017-08-10 DIAGNOSIS — I482 Chronic atrial fibrillation, unspecified: Secondary | ICD-10-CM | POA: Diagnosis present

## 2017-08-10 LAB — TROPONIN I
Troponin I: <0.03 ng/mL (ref <0.03)
Troponin I: <0.03 ng/mL (ref <0.03)

## 2017-08-10 LAB — LACTIC ACID, PLASMA
LACTIC ACID, VENOUS: 4.1 mmol/L — AB (ref 0.5–1.9)
Lactic Acid, Venous: 1.7 mmol/L (ref 0.5–1.9)

## 2017-08-10 LAB — BASIC METABOLIC PANEL
ANION GAP: 13 (ref 5–15)
Anion gap: 13 (ref 5–15)
BUN: 56 mg/dL — ABNORMAL HIGH (ref 6–20)
BUN: 52 mg/dL — ABNORMAL HIGH (ref 6–20)
CALCIUM: 9 mg/dL (ref 8.9–10.3)
CHLORIDE: 96 mmol/L — AB (ref 101–111)
CO2: 24 mmol/L (ref 22–32)
CO2: 21 mmol/L — ABNORMAL LOW (ref 22–32)
Calcium: 9.3 mg/dL (ref 8.9–10.3)
Chloride: 104 mmol/L (ref 101–111)
Creatinine, Ser: 1.76 mg/dL — ABNORMAL HIGH (ref 0.44–1.00)
Creatinine, Ser: 1.61 mg/dL — ABNORMAL HIGH (ref 0.44–1.00)
GFR calc Af Amer: 37 mL/min — ABNORMAL LOW (ref >60)
GFR, EST AFRICAN AMERICAN: 33 mL/min — AB (ref >60)
GFR, EST NON AFRICAN AMERICAN: 29 mL/min — AB (ref >60)
GFR, EST NON AFRICAN AMERICAN: 32 mL/min — AB (ref >60)
Glucose, Bld: 736 mg/dL (ref 65–99)
Glucose, Bld: 536 mg/dL (ref 65–99)
POTASSIUM: 6.4 mmol/L — AB (ref 3.5–5.1)
POTASSIUM: 4.8 mmol/L (ref 3.5–5.1)
SODIUM: 133 mmol/L — AB (ref 135–145)
Sodium: 138 mmol/L (ref 135–145)

## 2017-08-10 LAB — URINALYSIS, ROUTINE W REFLEX MICROSCOPIC
BILIRUBIN URINE: NEGATIVE
Glucose, UA: >=500 mg/dL — AB
KETONES UR: NEGATIVE mg/dL
Nitrite: NEGATIVE
PH: 5 (ref 5.0–8.0)
Protein, ur: NEGATIVE mg/dL
Specific Gravity, Urine: 1.016 (ref 1.005–1.030)

## 2017-08-10 LAB — CBG MONITORING, ED
GLUCOSE-CAPILLARY: 520 mg/dL — AB (ref 65–99)
GLUCOSE-CAPILLARY: 206 mg/dL — AB (ref 65–99)
Glucose-Capillary: 458 mg/dL — ABNORMAL HIGH (ref 65–99)
Glucose-Capillary: 397 mg/dL — ABNORMAL HIGH (ref 65–99)
Glucose-Capillary: 282 mg/dL — ABNORMAL HIGH (ref 65–99)

## 2017-08-10 LAB — DIFFERENTIAL
Basophils Absolute: 0 10*3/uL (ref 0.0–0.1)
Basophils Relative: 0 %
EOS ABS: 0.1 10*3/uL (ref 0.0–0.7)
Eosinophils Relative: 1 %
LYMPHS ABS: 0.7 10*3/uL (ref 0.7–4.0)
LYMPHS PCT: 8 %
Monocytes Absolute: 1 10*3/uL (ref 0.1–1.0)
Monocytes Relative: 12 %
NEUTROS ABS: 6.9 10*3/uL (ref 1.7–7.7)
NEUTROS PCT: 79 %

## 2017-08-10 LAB — HEPATIC FUNCTION PANEL
ALBUMIN: 3.3 g/dL — AB (ref 3.5–5.0)
ALT: 34 U/L (ref 14–54)
AST: 60 U/L — ABNORMAL HIGH (ref 15–41)
Alkaline Phosphatase: 154 U/L — ABNORMAL HIGH (ref 38–126)
BILIRUBIN DIRECT: 0.1 mg/dL (ref 0.1–0.5)
BILIRUBIN TOTAL: 0.6 mg/dL (ref 0.3–1.2)
Indirect Bilirubin: 0.5 mg/dL (ref 0.3–0.9)
Total Protein: 7.9 g/dL (ref 6.5–8.1)

## 2017-08-10 LAB — CBC
HCT: 47.5 % — ABNORMAL HIGH (ref 36.0–46.0)
HEMOGLOBIN: 15.8 g/dL — AB (ref 12.0–15.0)
MCH: 27.8 pg (ref 26.0–34.0)
MCHC: 33.3 g/dL (ref 30.0–36.0)
MCV: 83.5 fL (ref 78.0–100.0)
PLATELETS: 200 10*3/uL (ref 150–400)
RBC: 5.69 MIL/uL — AB (ref 3.87–5.11)
RDW: 16.2 % — ABNORMAL HIGH (ref 11.5–15.5)
WBC: 9.4 10*3/uL (ref 4.0–10.5)

## 2017-08-10 LAB — PROTIME-INR
INR: 3.22
PROTHROMBIN TIME: 32.7 s — AB (ref 11.4–15.2)

## 2017-08-10 LAB — LIPASE, BLOOD: LIPASE: 249 U/L — AB (ref 11–51)

## 2017-08-10 MED ORDER — LISINOPRIL 5 MG PO TABS
5.0000 mg | ORAL_TABLET | Freq: Every day | ORAL | Status: DC
  Administered 2017-08-11 – 2017-08-12 (×2): 5 mg via ORAL
  Filled 2017-08-10 (×2): qty 1

## 2017-08-10 MED ORDER — DEXTROSE 50 % IV SOLN: 25.0000 mL | INTRAVENOUS | Status: DC | PRN

## 2017-08-10 MED ORDER — SODIUM CHLORIDE 0.9 % IV SOLN
INTRAVENOUS | Status: DC
  Administered 2017-08-10: 16:00:00 via INTRAVENOUS

## 2017-08-10 MED ORDER — SODIUM CHLORIDE 0.9 % IV SOLN
INTRAVENOUS | Status: DC
  Administered 2017-08-10: 4.6 [IU]/h via INTRAVENOUS
  Filled 2017-08-10: qty 1

## 2017-08-10 MED ORDER — AMLODIPINE BESYLATE 5 MG PO TABS
10.0000 mg | ORAL_TABLET | Freq: Every day | ORAL | Status: DC
  Administered 2017-08-11 – 2017-08-12 (×2): 10 mg via ORAL
  Filled 2017-08-10 (×2): qty 2

## 2017-08-10 MED ORDER — DEXTROSE-NACL 5-0.45 % IV SOLN: INTRAVENOUS | Status: DC

## 2017-08-10 MED ORDER — DEXTROSE-NACL 5-0.45 % IV SOLN
INTRAVENOUS | Status: DC
  Administered 2017-08-10: 23:00:00 via INTRAVENOUS

## 2017-08-10 MED ORDER — SODIUM CHLORIDE 0.9 % IV SOLN
INTRAVENOUS | Status: DC
  Filled 2017-08-10: qty 1

## 2017-08-10 MED ORDER — SODIUM CHLORIDE 0.9 % IV SOLN: INTRAVENOUS | Status: DC

## 2017-08-10 MED ORDER — SODIUM CHLORIDE 0.9 % IV SOLN
1.0000 g | Freq: Once | INTRAVENOUS | Status: AC
  Administered 2017-08-10: 1 g via INTRAVENOUS
  Filled 2017-08-10: qty 10

## 2017-08-10 MED ORDER — INSULIN ASPART 100 UNIT/ML IV SOLN
10.0000 [IU] | Freq: Once | INTRAVENOUS | Status: AC
  Administered 2017-08-10: 10 [IU] via INTRAVENOUS

## 2017-08-10 MED ORDER — SODIUM CHLORIDE 0.9 % IV BOLUS (SEPSIS)
2000.0000 mL | Freq: Once | INTRAVENOUS | Status: AC
  Administered 2017-08-10: 2000 mL via INTRAVENOUS

## 2017-08-10 MED ORDER — METOPROLOL TARTRATE 50 MG PO TABS
75.0000 mg | ORAL_TABLET | Freq: Two times a day (BID) | ORAL | Status: DC
  Administered 2017-08-11 – 2017-08-12 (×3): 75 mg via ORAL
  Filled 2017-08-10 (×3): qty 1

## 2017-08-10 MED ORDER — ALBUTEROL SULFATE (2.5 MG/3ML) 0.083% IN NEBU
10.0000 mg | INHALATION_SOLUTION | Freq: Once | RESPIRATORY_TRACT | Status: AC
  Administered 2017-08-10: 10 mg via RESPIRATORY_TRACT
  Filled 2017-08-10: qty 12

## 2017-08-10 MED ORDER — LEVOTHYROXINE SODIUM 75 MCG PO TABS
150.0000 ug | ORAL_TABLET | Freq: Every day | ORAL | Status: DC
  Administered 2017-08-11 – 2017-08-12 (×2): 150 ug via ORAL
  Filled 2017-08-10: qty 3
  Filled 2017-08-10: qty 2

## 2017-08-10 MED ORDER — INSULIN REGULAR BOLUS VIA INFUSION
0.0000 [IU] | Freq: Three times a day (TID) | INTRAVENOUS | Status: DC
  Filled 2017-08-10: qty 10

## 2017-08-10 NOTE — ED Notes (Signed)
CRITICAL VALUE ALERT  Critical Value:  Glucose 536  Date & Time Notied:  08/10/17 1824  Provider Notified: Dr. Clarene Duke Orders Received/Actions taken: no new orders
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
## 2017-08-10 NOTE — H&P (Addendum)
History and Physical    Amber Armstrong OMB:[id number] DOB: 02/14/1951 DOA: 08/10/2017  PCP: Wilson Singer, MD   Patient coming from: Home  Chief Complaint: nausea, vomiting  HPI: Amber Armstrong is a 66 y.o. female with medical history significant of atrial fibrillation, HTN, Hypothyroidism, Type II diabetes mellitus, and chronic diastolic heart failure (last echocardiogram on file in 2015 with EF of 60%) presented to the ED for nausea and vomiting for the past two days.  Patient states she has been vomiting for about three days.  Denies hematemesis states emesis looks more like clear mucus.  No diarrhea, chills.  Patient later reports that she has been having "stomach issues" for 1 week.  She mentions she was diagnosed with a urinary tract infection last week and couldn't get her medication until several days later.  She checks her blood sugar daily in the morning and for the past two days her meter has read "HIGH".  She does not take insulin.  Her sister has been sick with flu- like illness of diarrhea and fever.  No chest pain, shortness of breath.  Does endorse slight abdominal pain on the left side.  ED Course: Per EDP note "Pt was seen at 1600. Per pt, c/o gradual onset and persistence of multiple intermittent episodes of N/V for the past 2 days. Pt states her home glucose machine has also been reading "high" for the past several days. Pt states she was dx with a "urine infection" last week, but did not pick up her abx until 2 days ago. Denies CP/SOB, no abd pain, no diarrhea, no black or blood in stools or emesis, no back pain, no fevers".  Found to have potassium of 6.4 and given 2L NS, albuterol and calcium as EKG showed peaked T waves.  Glucose of >700, Cr of 1.56, hemoconcentration of 15.8/47.5.  Urinalysis did not show ketones. After IVF started on insulin drip.  Hospitalist asked to admit. CXR showing mild pulmonary congestion.  Review of Systems: As per HPI otherwise 10 point review of  systems negative.    Past Medical History:  Diagnosis Date  . Atrial fibrillation (HCC)   . Chronic diastolic heart failure (HCC)   . Hyperglycemia without ketosis 03/17/2015  . Hypertension   . Hypothyroidism (acquired)   . Nephrolithiasis   . Overweight(278.02)   . Right ventricular hypertrophy 12/12/2012  . Type 2 diabetes mellitus (HCC)    Type II  . UTI (urinary tract infection)     Past Surgical History:  Procedure Laterality Date  . GROIN DISSECTION Right 04/20/2013   Procedure: GROIN EXPLORATION;  Surgeon: Fabio Bering, MD;  Location: [facility];  Service: General;  Laterality: Right;  . THYROIDECTOMY       reports that she has never smoked. She has never used smokeless tobacco. She reports that she does not drink alcohol or use drugs.  Allergies  Allergen Reactions  . Penicillins Rash    Family History  Problem Relation Age of Onset  . Arrhythmia Mother        Atrial fib     Prior to Admission medications   Medication Sig Start Date End Date Taking? Authorizing Provider  acetaminophen (TYLENOL) 500 MG tablet Take 500 mg by mouth every 6 (six) hours as needed for pain.    [provider]  aspirin EC 81 MG tablet Take 81 mg by mouth daily.    [provider]  azithromycin (ZITHROMAX) 250 MG tablet Take 1 tablet (250  mg total) by mouth daily. Take first 2 tablets together, then 1 every day until finished. 10/05/16   Linwood Dibbles, MD  furosemide (LASIX) 40 MG tablet Take 20 mg by mouth 2 (two) times daily.  06/17/13   Jodelle Gross, NP  glimepiride (AMARYL) 2 MG tablet Take 1 mg by mouth daily before breakfast.     [provider]  insulin glargine (LANTUS) 100 UNIT/ML injection Inject 0.15 mLs (15 Units total) into the skin at bedtime. Patient taking differently: Inject 20 Units into the skin at bedtime.  03/18/15   Black, Lesle Chris, NP  metoprolol (LOPRESSOR) 50 MG tablet Take 1.5 tablets (75 mg total) by mouth 2 (two) times daily.  10/14/15   Antoine Poche, MD  spironolactone (ALDACTONE) 25 MG tablet Take 12.5 mg by mouth daily.  06/17/13   Jodelle Gross, NP  warfarin (COUMADIN) 3 MG tablet Take 1 1/2 tablets daily or as directed 06/01/16   Antoine Poche, MD    Physical Exam: Vitals:   08/10/17 1427 08/10/17 1606 08/10/17 1615 08/10/17 1630  BP:  (!) 142/101  121/85  Pulse:  89 (!) 101 84  Resp:  18 17 16   Temp:      TempSrc:      SpO2:  94% 96% 100%  Weight: 72.1 kg (159 lb)     Height: 5' (1.524 m)         Constitutional: NAD, calm, comfortable Vitals:   08/10/17 1427 08/10/17 1606 08/10/17 1615 08/10/17 1630  BP:  (!) 142/101  121/85  Pulse:  89 (!) 101 84  Resp:  18 17 16   Temp:      TempSrc:      SpO2:  94% 96% 100%  Weight: 72.1 kg (159 lb)     Height: 5' (1.524 m)      Eyes: PERRL, lids and conjunctivae normal ENMT: Mucous membranes are moist. Posterior pharynx clear of any exudate or lesions.Normal dentition.  Neck: normal, supple, no masses, no thyromegaly Respiratory: clear to auscultation bilaterally, no wheezing, no crackles. Normal respiratory effort. No accessory muscle use.  Cardiovascular: Regular rate and rhythm, no murmurs / rubs / gallops. No extremity edema. 2+ pedal pulses. No carotid bruits.  Abdomen: obese, no tenderness, no masses palpated. No hepatosplenomegaly. Bowel sounds positive.  Musculoskeletal: no clubbing / cyanosis. No joint deformity upper and lower extremities. Good ROM, no contractures. Normal muscle tone.  Skin: no rashes, lesions, ulcers. No induration Neurologic: CN 2-12 grossly intact. Sensation intact, DTR normal. Strength 5/5 in all 4.  Psychiatric: Alert and oriented x 3. Normal mood.    Labs on Admission: I have personally reviewed following labs and imaging studies  CBC:  Recent Labs Lab 08/10/17 1453  WBC 9.4  NEUTROABS 6.9  HGB 15.8*  HCT 47.5*  MCV 83.5  PLT 200   Basic Metabolic Panel:  Recent Labs Lab 08/10/17 1453    NA 133*  K 6.4*  CL 96*  CO2 24  GLUCOSE 736*  BUN 56*  CREATININE 1.76*  CALCIUM 9.3   GFR: Estimated Creatinine Clearance: 27.5 mL/min (A) (by C-G formula based on SCr of 1.76 mg/dL (H)). Liver Function Tests:  Recent Labs Lab 08/10/17 1453  AST 60*  ALT 34  ALKPHOS 154*  BILITOT 0.6  PROT 7.9  ALBUMIN 3.3*    Recent Labs Lab 08/10/17 1453  LIPASE 249*   No results for input(s): AMMONIA in the last 168 hours. Coagulation Profile: No results for input(s):  INR, PROTIME in the last 168 hours. Cardiac Enzymes:  Recent Labs Lab 08/10/17 1453  TROPONINI <0.03   BNP (last 3 results) No results for input(s): PROBNP in the last 8760 hours. HbA1C: No results for input(s): HGBA1C in the last 72 hours. CBG: No results for input(s): GLUCAP in the last 168 hours. Lipid Profile: No results for input(s): CHOL, HDL, LDLCALC, TRIG, CHOLHDL, LDLDIRECT in the last 72 hours. Thyroid Function Tests: No results for input(s): TSH, T4TOTAL, FREET4, T3FREE, THYROIDAB in the last 72 hours. Anemia Panel: No results for input(s): VITAMINB12, FOLATE, FERRITIN, TIBC, IRON, RETICCTPCT in the last 72 hours. Urine analysis:    Component Value Date/Time   COLORURINE YELLOW 08/10/2017 1434   APPEARANCEUR CLOUDY (A) 08/10/2017 1434   LABSPEC 1.016 08/10/2017 1434   PHURINE 5.0 08/10/2017 1434   GLUCOSEU >=500 (A) 08/10/2017 1434   HGBUR LARGE (A) 08/10/2017 1434   BILIRUBINUR NEGATIVE 08/10/2017 1434   KETONESUR NEGATIVE 08/10/2017 1434   PROTEINUR NEGATIVE 08/10/2017 1434   UROBILINOGEN 0.2 03/17/2015 1159   NITRITE NEGATIVE 08/10/2017 1434   LEUKOCYTESUR MODERATE (A) 08/10/2017 1434   Sepsis Labs: !!!!!!!!!!!!!!!!!!!!!!!!!!!!!!!!!!!!!!!!!!!! @LABRCNTIP (procalcitonin:4,lacticidven:4) )No results found for this or any previous visit (from the past 240 hour(s)).   Radiological Exams on Admission: Dg Chest Port 1 View  Result Date: 08/10/2017 CLINICAL DATA:  Kidney infection.  EXAM: PORTABLE CHEST 1 VIEW COMPARISON:  04/24/2013. FINDINGS: Stable cardiomegaly. No pulmonary venous congestion. No evidence of pulmonary edema noted on today's exam as noted on prior chest x-ray of 04/24/2013 . No focal infiltrate. No pleural effusion or pneumothorax . IMPRESSION: Stable cardiomegaly. Mild pulmonary venous congestion. No evidence of overt pulmonary edema. Electronically Signed   By: Maisie Fushomas  Register   On: 08/10/2017 16:39    EKG: Independently reviewed. Atrial fibrillation, peaked t waves  Assessment/Plan Principal Problem:   Hyperglycemia Active Problems:   Essential hypertension   FIBRILLATION, ATRIAL   Long term current use of anticoagulant   Dehydration   Hyperglycemia due to type 2 diabetes mellitus (HCC)   Acute kidney injury (HCC)   Hyperglycemia - given 2L NS - starting on insulin drip - admit to step down - repeat BMP q4 (given hyperkalemia) - continue IVF of NS at 100/hr after bolus - monitor for signs of fluid overload  Hyperkalemia - peaked T wave on EKG - given 2L NS - given calcium, albuterol and insulin - repeat BMP q4  Atrial fib - continue toprol and coumadin - daily INR  Essential HTN - continue home meds  Acute kidney injury - in setting of dehydration - repeat BMP q4h     DVT prophylaxis: coumadin, SCDs  Code Status: DNR  Family Communication: no family bedside  Disposition Plan: likely discharge back to previous home environment when medically improved  Consults called: none Admission status: inpatient, SDU    Katrinka BlazingAlex U Kadolph MD [facility] Pager [phone]  If 7PM-7AM, please contact night-coverage [email or web address] Password [email or web address]  08/10/2017, 5:08 PM

## 2017-08-10 NOTE — ED Notes (Signed)
hospitalist at bedside at this time 

## 2017-08-10 NOTE — ED Notes (Signed)
Date and time results received: 08/10/17 2154 (use smartphrase ".now" to insert current time)  Test: lactic acid Critical Value: 4.1  Name of Provider Notified: [email or web address]  Orders Received? Or Actions Taken?: none at this time

## 2017-08-11 ENCOUNTER — Encounter (HOSPITAL_COMMUNITY): Payer: Self-pay

## 2017-08-11 LAB — BASIC METABOLIC PANEL
ANION GAP: 11 (ref 5–15)
ANION GAP: 13 (ref 5–15)
Anion gap: 12 (ref 5–15)
Anion gap: 8 (ref 5–15)
BUN: 41 mg/dL — ABNORMAL HIGH (ref 6–20)
BUN: 43 mg/dL — ABNORMAL HIGH (ref 6–20)
BUN: 40 mg/dL — ABNORMAL HIGH (ref 6–20)
BUN: 36 mg/dL — ABNORMAL HIGH (ref 6–20)
CHLORIDE: 103 mmol/L (ref 101–111)
CHLORIDE: 105 mmol/L (ref 101–111)
CHLORIDE: 107 mmol/L (ref 101–111)
CHLORIDE: 107 mmol/L (ref 101–111)
CO2: 19 mmol/L — ABNORMAL LOW (ref 22–32)
CO2: 21 mmol/L — ABNORMAL LOW (ref 22–32)
CO2: 21 mmol/L — ABNORMAL LOW (ref 22–32)
CO2: 22 mmol/L (ref 22–32)
CREATININE: 1.31 mg/dL — AB (ref 0.44–1.00)
Calcium: 8.6 mg/dL — ABNORMAL LOW (ref 8.9–10.3)
Calcium: 8.5 mg/dL — ABNORMAL LOW (ref 8.9–10.3)
Calcium: 8.6 mg/dL — ABNORMAL LOW (ref 8.9–10.3)
Calcium: 8.6 mg/dL — ABNORMAL LOW (ref 8.9–10.3)
Creatinine, Ser: 1.4 mg/dL — ABNORMAL HIGH (ref 0.44–1.00)
Creatinine, Ser: 1.42 mg/dL — ABNORMAL HIGH (ref 0.44–1.00)
Creatinine, Ser: 1.41 mg/dL — ABNORMAL HIGH (ref 0.44–1.00)
GFR calc non Af Amer: 38 mL/min — ABNORMAL LOW (ref >60)
GFR calc non Af Amer: 37 mL/min — ABNORMAL LOW (ref >60)
GFR, EST AFRICAN AMERICAN: 43 mL/min — AB (ref >60)
GFR, EST AFRICAN AMERICAN: 44 mL/min — AB (ref >60)
GFR, EST AFRICAN AMERICAN: 44 mL/min — AB (ref >60)
GFR, EST AFRICAN AMERICAN: 48 mL/min — AB (ref >60)
GFR, EST NON AFRICAN AMERICAN: 38 mL/min — AB (ref >60)
GFR, EST NON AFRICAN AMERICAN: 41 mL/min — AB (ref >60)
Glucose, Bld: 214 mg/dL — ABNORMAL HIGH (ref 65–99)
Glucose, Bld: 317 mg/dL — ABNORMAL HIGH (ref 65–99)
Glucose, Bld: 419 mg/dL — ABNORMAL HIGH (ref 65–99)
Glucose, Bld: 385 mg/dL — ABNORMAL HIGH (ref 65–99)
POTASSIUM: 3.8 mmol/L (ref 3.5–5.1)
POTASSIUM: 4.6 mmol/L (ref 3.5–5.1)
POTASSIUM: 4.6 mmol/L (ref 3.5–5.1)
POTASSIUM: 4.8 mmol/L (ref 3.5–5.1)
SODIUM: 135 mmol/L (ref 135–145)
SODIUM: 136 mmol/L (ref 135–145)
SODIUM: 139 mmol/L (ref 135–145)
SODIUM: 139 mmol/L (ref 135–145)

## 2017-08-11 LAB — PROTIME-INR
INR: 3.01
Prothrombin Time: 31 seconds — ABNORMAL HIGH (ref 11.4–15.2)

## 2017-08-11 LAB — TROPONIN I
Troponin I: <0.03 ng/mL (ref <0.03)
Troponin I: <0.03 ng/mL (ref <0.03)

## 2017-08-11 LAB — CBG MONITORING, ED
GLUCOSE-CAPILLARY: 277 mg/dL — AB (ref 65–99)
Glucose-Capillary: 335 mg/dL — ABNORMAL HIGH (ref 65–99)
Glucose-Capillary: 306 mg/dL — ABNORMAL HIGH (ref 65–99)
Glucose-Capillary: 345 mg/dL — ABNORMAL HIGH (ref 65–99)
Glucose-Capillary: 406 mg/dL — ABNORMAL HIGH (ref 65–99)
Glucose-Capillary: 354 mg/dL — ABNORMAL HIGH (ref 65–99)
Glucose-Capillary: 113 mg/dL — ABNORMAL HIGH (ref 65–99)

## 2017-08-11 LAB — HEMOGLOBIN A1C
HEMOGLOBIN A1C: 13 % — AB (ref 4.8–5.6)
Mean Plasma Glucose: 326.4 mg/dL

## 2017-08-11 LAB — GLUCOSE, CAPILLARY: Glucose-Capillary: 178 mg/dL — ABNORMAL HIGH (ref 65–99)

## 2017-08-11 MED ORDER — INSULIN GLARGINE 100 UNIT/ML ~~LOC~~ SOLN
15.0000 [IU] | Freq: Every day | SUBCUTANEOUS | Status: DC
  Administered 2017-08-11 – 2017-08-12 (×2): 15 [IU] via SUBCUTANEOUS
  Filled 2017-08-11 (×4): qty 0.15

## 2017-08-11 MED ORDER — HYDRALAZINE HCL 20 MG/ML IJ SOLN: 5.0000 mg | INTRAMUSCULAR | Status: DC | PRN

## 2017-08-11 MED ORDER — INSULIN ASPART 100 UNIT/ML ~~LOC~~ SOLN: 0.0000 [IU] | Freq: Every day | SUBCUTANEOUS | Status: DC

## 2017-08-11 MED ORDER — INSULIN ASPART 100 UNIT/ML ~~LOC~~ SOLN
0.0000 [IU] | Freq: Three times a day (TID) | SUBCUTANEOUS | Status: DC
  Administered 2017-08-11: 15 [IU] via SUBCUTANEOUS
  Administered 2017-08-12 (×2): 5 [IU] via SUBCUTANEOUS
  Filled 2017-08-11: qty 1

## 2017-08-11 MED ORDER — INSULIN ASPART 100 UNIT/ML ~~LOC~~ SOLN
0.0000 [IU] | Freq: Three times a day (TID) | SUBCUTANEOUS | Status: DC
  Administered 2017-08-11: 7 [IU] via SUBCUTANEOUS
  Filled 2017-08-11: qty 1

## 2017-08-11 MED ORDER — WARFARIN - PHARMACIST DOSING INPATIENT: Status: DC

## 2017-08-11 MED ORDER — ONDANSETRON HCL 4 MG/2ML IJ SOLN
4.0000 mg | Freq: Four times a day (QID) | INTRAMUSCULAR | Status: DC | PRN
  Administered 2017-08-11: 4 mg via INTRAVENOUS
  Filled 2017-08-11: qty 2

## 2017-08-11 NOTE — ED Notes (Signed)
Pt sleeping. 

## 2017-08-11 NOTE — Progress Notes (Signed)
ANTICOAGULATION CONSULT NOTE - Initial Consult  Pharmacy Consult for Warfarin (home med) Indication: atrial fibrillation  Allergies  Allergen Reactions  . Penicillins Rash    Patient Measurements: Height: 5' (152.4 cm) Weight: 159 lb (72.1 kg) IBW/kg (Calculated) : 45.5  Vital Signs: BP: 161/142 (10/19 1300) Pulse Rate: 71 (10/19 1300)  Labs:  Recent Labs  08/10/17 1453  08/10/17 2034 08/11/17 0009 08/11/17 0353 08/11/17 0748 08/11/17 1148  HGB 15.8*  --   --   --   --   --   --   HCT 47.5*  --   --   --   --   --   --   PLT 200  --   --   --   --   --   --   LABPROT 32.7*  --   --   --   --   --  31.0*  INR 3.22  --   --   --   --   --  3.01  CREATININE 1.76*  < >  --  1.40* 1.42* 1.41* 1.31*  TROPONINI <0.03  --  <0.03 <0.03 <0.03  --   --   < > = values in this interval not displayed.  Estimated Creatinine Clearance: 36.9 mL/min (A) (by C-G formula based on SCr of 1.31 mg/dL (H)).   Medical History: Past Medical History:  Diagnosis Date  . Atrial fibrillation (HCC)   . Chronic diastolic heart failure (HCC)   . Hyperglycemia without ketosis 03/17/2015  . Hypertension   . Hypothyroidism (acquired)   . Nephrolithiasis   . Overweight(278.02)   . Right ventricular hypertrophy 12/12/2012  . Type 2 diabetes mellitus (HCC)    Type II  . UTI (urinary tract infection)     Medications:   (Not in a hospital admission)  Assessment: 66yo female on Warfarin PTA for h/o afib.  INR > 3 on admission.   Goal of Therapy:  INR 2-3 Monitor platelets by anticoagulation protocol: Yes   Plan:  HOLD coumadin today Check INR daily  Valrie Hart A 08/11/2017,1:57 PM

## 2017-08-11 NOTE — Progress Notes (Signed)
Inpatient Diabetes Program Recommendations  AACE/ADA: New Consensus Statement on Inpatient Glycemic Control (2015)  Target Ranges:  Prepandial:   less than 140 mg/dL      Peak postprandial:   less than 180 mg/dL (1-2 hours)      Critically ill patients:  140 - 180 mg/dL  Results for Amber Armstrong, Amber Armstrong (MRN [id number]) as of 08/11/2017 10:41  Ref. Range 08/10/2017 14:53 08/10/2017 17:34 08/11/2017 00:09 08/11/2017 03:53 08/11/2017 07:48  Glucose Latest Ref Range: 65 - 99 mg/dL 076 (HH) 808 (HH) 811 (H) 317 (H) 419 (H)   Results for Amber Armstrong, Amber Armstrong (MRN [id number]) as of 08/11/2017 10:41  Ref. Range 08/10/2017 18:15 08/10/2017 19:26 08/10/2017 20:32 08/10/2017 21:50 08/10/2017 22:54 08/11/2017 02:28 08/11/2017 04:46 08/11/2017 06:10 08/11/2017 07:04 08/11/2017 10:29  Glucose-Capillary Latest Ref Range: 65 - 99 mg/dL 929 (HH) 244 (H) 628 (H) 282 (H) 206 (H) 277 (H) 335 (H) 306 (H) 345 (H) 406 (H)   Review of Glycemic Control  Diabetes history: DM2 Outpatient Diabetes medications: Glipizide 20 mg BID Current orders for Inpatient glycemic control: Novolog 0-9 units TID with meals, Novolog 0-10 units TID IV with meals, Novolin R insulin drip  Inpatient Diabetes Program Recommendations: Insulin - Basal: Noted patient's initial glucose 736 mg/dl on 63/81/77 and she was initially started on IV insulin drip. Per chart review, insulin drip was stopped around 23:57 on 08/10/17 and NO BASAL insulin was given when transitioned to SQ insulin. Glucose up to 406 mg/dl at 11:65 am today. Please consider ordering Lantus 15 units daily (starting now).  Insulin-Correction: Please consider increasing Novolog correction to 0-15 units TID with meals and adding Novolog 0-5 units QHS. A1C: Please consider ordering an A1C to evaluate glycemic control over the past 2-3 months. IV Fluids: Please discontinue IV fluids with dextrose.  NOTE: In reviewing chart, noted initial glucose 736 mg/dl on 79/03/83 and patient was  started on an insulin drip. Insulin drip was stopped on 08/10/17 at 23:57 and NO basal insulin was given at that time. Noted glucose up to 406 mg/dl at 33:83 am today. Noted ED MD note dated 10/06/2016 that patient was prescribed Lantus 20 units QHS at that time. No insulin noted on current home medication list. Currently, patient does not have insulin drip infusing for any IV fluids. Talked with JJ, RN and she reports she spoke with MD this morning and was given verbal orders to start Novolog correction scale.  Spoke with patient and she confirms that she use to take Lantus insulin in the past. Patient reports that her PCP (Dr. Karilyn Cota) took her off insulin and prescribed oral DM medication for DM control about 6 months ago. Patient reports that when she was taking Lantus she was using vial/syringe. Explained to patient that she may need to resume insulin as an outpatient and patient is agreeable to using insulin as an outpatient if prescribed at time of discharge.  Thanks, Orlando Penner, RN, MSN, CDE Diabetes Coordinator Inpatient Diabetes Program [phone] (Team Pager from 8am to 5pm)

## 2017-08-11 NOTE — ED Notes (Signed)
Pt up to the bedside commode 

## 2017-08-11 NOTE — Progress Notes (Signed)
PROGRESS NOTE    Amber BaileyJanice L Garner  WGN:[id number] DOB: 01/08/1951 DOA: 08/10/2017 PCP: Wilson SingerGosrani, Nimish C, MD    Brief Narrative:  Amber Armstrong is a 66 y.o. female with medical history significant of atrial fibrillation, HTN, Hypothyroidism, Type II diabetes mellitus, and chronic diastolic heart failure (last echocardiogram on file in 2015 with EF of 60%) presented to the ED for nausea and vomiting for the past two days.  Patient states she has been vomiting for about three days.  Denies hematemesis states emesis looks more like clear mucus.  No diarrhea, chills.  Patient later reports that she has been having "stomach issues" for 1 week.  She mentions she was diagnosed with a urinary tract infection last week and couldn't get her medication until several days later.  She checks her blood sugar daily in the morning and for the past two days her meter has read "HIGH".  She does not take insulin.  Her sister has been sick with flu- like illness of diarrhea and fever.  No chest pain, shortness of breath.  Does endorse slight abdominal pain on the left side.  Per EDP note "Pt was seen at 1600. Per pt, c/o gradual onset and persistence of multiple intermittent episodes of N/V for the past 2 days. Pt states her home glucose machine has also been reading "high" for the past several days. Pt states she was dx with a "urine infection" last week, but did not pick up her abx until 2 days ago. Denies CP/SOB, no abd pain, no diarrhea, no black or blood in stools or emesis, no back pain, no fevers".  Found to have potassium of 6.4 and given 2L NS, albuterol and calcium as EKG showed peaked T waves.  Glucose of >700, Cr of 1.56, hemoconcentration of 15.8/47.5.  Urinalysis did not show ketones. After IVF started on insulin drip.  Hospitalist asked to admit. CXR showing mild pulmonary congestion.  Patient was transitioned to subcutaneous insulin and blood sugars were monitored.   Assessment & Plan:   Principal  Problem:   Hyperglycemia Active Problems:   Essential hypertension   FIBRILLATION, ATRIAL   Long term current use of anticoagulant   Dehydration   Hyperglycemia due to type 2 diabetes mellitus (HCC)   Acute kidney injury (HCC)   Hyperglycemia - given 2L NS in ED - tolerating PO - off insulin drip current - transitioned to [place] insulin - CBGs  Hyperkalemia - given calcium, albuterol and insulin in ED - resolved - BMP qday  Atrial fib - continue toprol and coumadin - daily INR  Essential HTN - continue home meds  Acute kidney injury - in setting of dehydration - BMP daily    DVT prophylaxis: coumadin, SCDs  Code Status: DNR  Family Communication: no family bedside  Disposition Plan: likely discharge back to previous home environment when medically improved, possibly in the next day or two    Consultants:   None  Procedures:   None  Antimicrobials:   None    Subjective: Patient is sitting in ED.  She reports that she feels remarkably better today than at time of admission.  No vomiting since admission.  No nausea.  Blood sugars still elevated as DKA order set had been discontinued overnight.  Objective: Vitals:   08/11/17 1130 08/11/17 1300 08/11/17 1324 08/11/17 1400  BP: 120/89  (!) 121/96 (!) 105/54  Pulse: 72 71 71 92  Resp: 20 (!) 27  19  Temp:  TempSrc:      SpO2: 98% 100% 100% 98%  Weight:      Height:        Intake/Output Summary (Last 24 hours) at 08/11/17 1527 Last data filed at 08/10/17 1800  Gross per 24 hour  Intake             2110 ml  Output                0 ml  Net             2110 ml   Filed Weights   08/10/17 1427  Weight: 72.1 kg (159 lb)    Examination:  General exam: Appears calm and comfortable  Respiratory system: Clear to auscultation. Respiratory effort normal. Cardiovascular system: S1 & S2 heard, IRRR. No JVD, murmurs, rubs, gallops or clicks. No pedal edema. Gastrointestinal system: Abdomen is  obese, nondistended, soft and nontender. No organomegaly or masses felt. Normal bowel sounds heard. Central nervous system: Alert and oriented. No focal neurological deficits. Extremities: Symmetric 5 x 5 power. Skin: No rashes, lesions or ulcers Psychiatry: Questionable insight, Mood & affect appropriate.     Data Reviewed: I have personally reviewed following labs and imaging studies  CBC:  Recent Labs Lab 08/10/17 1453  WBC 9.4  NEUTROABS 6.9  HGB 15.8*  HCT 47.5*  MCV 83.5  PLT 200   Basic Metabolic Panel:  Recent Labs Lab 08/10/17 1734 08/11/17 0009 08/11/17 0353 08/11/17 0748 08/11/17 1148  NA 138 139 139 136 135  K 4.8 3.8 4.8 4.6 4.6  CL 104 107 107 103 105  CO2 21* 19* 21* 21* 22  GLUCOSE 536* 214* 317* 419* 385*  BUN 52* 41* 43* 40* 36*  CREATININE 1.61* 1.40* 1.42* 1.41* 1.31*  CALCIUM 9.0 8.6* 8.5* 8.6* 8.6*   GFR: Estimated Creatinine Clearance: 36.9 mL/min (A) (by C-G formula based on SCr of 1.31 mg/dL (H)). Liver Function Tests:  Recent Labs Lab 08/10/17 1453  AST 60*  ALT 34  ALKPHOS 154*  BILITOT 0.6  PROT 7.9  ALBUMIN 3.3*    Recent Labs Lab 08/10/17 1453  LIPASE 249*   No results for input(s): AMMONIA in the last 168 hours. Coagulation Profile:  Recent Labs Lab 08/10/17 1453 08/11/17 1148  INR 3.22 3.01   Cardiac Enzymes:  Recent Labs Lab 08/10/17 1453 08/10/17 2034 08/11/17 0009 08/11/17 0353  TROPONINI <0.03 <0.03 <0.03 <0.03   BNP (last 3 results) No results for input(s): PROBNP in the last 8760 hours. HbA1C: No results for input(s): HGBA1C in the last 72 hours. CBG:  Recent Labs Lab 08/11/17 0446 08/11/17 0610 08/11/17 0704 08/11/17 1029 08/11/17 1210  GLUCAP 335* 306* 345* 406* 354*   Lipid Profile: No results for input(s): CHOL, HDL, LDLCALC, TRIG, CHOLHDL, LDLDIRECT in the last 72 hours. Thyroid Function Tests: No results for input(s): TSH, T4TOTAL, FREET4, T3FREE, THYROIDAB in the last 72  hours. Anemia Panel: No results for input(s): VITAMINB12, FOLATE, FERRITIN, TIBC, IRON, RETICCTPCT in the last 72 hours. Sepsis Labs:  Recent Labs Lab 08/10/17 1457 08/10/17 2034  LATICACIDVEN 1.7 4.1*    No results found for this or any previous visit (from the past 240 hour(s)).       Radiology Studies: Dg Chest Port 1 View  Result Date: 08/10/2017 CLINICAL DATA:  Kidney infection. EXAM: PORTABLE CHEST 1 VIEW COMPARISON:  04/24/2013. FINDINGS: Stable cardiomegaly. No pulmonary venous congestion. No evidence of pulmonary edema noted on today's exam as noted on prior  chest x-ray of 04/24/2013 . No focal infiltrate. No pleural effusion or pneumothorax . IMPRESSION: Stable cardiomegaly. Mild pulmonary venous congestion. No evidence of overt pulmonary edema. Electronically Signed   By: Maisie Fus  Register   On: 08/10/2017 16:39        Scheduled Meds: . amLODipine  10 mg Oral Daily  . insulin aspart  0-15 Units Subcutaneous TID WC  . insulin aspart  0-5 Units Subcutaneous QHS  . insulin glargine  15 Units Subcutaneous Daily  . levothyroxine  150 mcg Oral QAC breakfast  . lisinopril  5 mg Oral Daily  . metoprolol tartrate  75 mg Oral BID  . Warfarin - Pharmacist Dosing Inpatient   Does not apply Q24H   Continuous Infusions: . dextrose 5 % and 0.45% NaCl       LOS: 1 day    Time spent: 25 minutes    Katrinka Blazing, MD [facility] Pager [phone]  If 7PM-7AM, please contact night-coverage [email or web address] Password [email or web address] 08/11/2017, 3:27 PM

## 2017-08-12 LAB — BASIC METABOLIC PANEL
Anion gap: 10 (ref 5–15)
BUN: 26 mg/dL — AB (ref 6–20)
CHLORIDE: 109 mmol/L (ref 101–111)
CO2: 22 mmol/L (ref 22–32)
Calcium: 8.7 mg/dL — ABNORMAL LOW (ref 8.9–10.3)
Creatinine, Ser: 1.21 mg/dL — ABNORMAL HIGH (ref 0.44–1.00)
GFR calc Af Amer: 52 mL/min — ABNORMAL LOW (ref >60)
GFR calc non Af Amer: 45 mL/min — ABNORMAL LOW (ref >60)
Glucose, Bld: 214 mg/dL — ABNORMAL HIGH (ref 65–99)
POTASSIUM: 4.5 mmol/L (ref 3.5–5.1)
SODIUM: 141 mmol/L (ref 135–145)

## 2017-08-12 LAB — URINE CULTURE: CULTURE: NO GROWTH

## 2017-08-12 LAB — PROTIME-INR
INR: 2.57
Prothrombin Time: 27.4 seconds — ABNORMAL HIGH (ref 11.4–15.2)

## 2017-08-12 LAB — GLUCOSE, CAPILLARY
GLUCOSE-CAPILLARY: 228 mg/dL — AB (ref 65–99)
Glucose-Capillary: 230 mg/dL — ABNORMAL HIGH (ref 65–99)

## 2017-08-12 MED ORDER — WARFARIN SODIUM 2.5 MG PO TABS: 4.5000 mg | ORAL_TABLET | Freq: Once | ORAL | Status: DC

## 2017-08-12 MED ORDER — BLOOD GLUCOSE METER KIT: PACK | 0 refills | Status: AC

## 2017-08-12 MED ORDER — INSULIN GLARGINE 100 UNIT/ML ~~LOC~~ SOLN: 17.0000 [IU] | Freq: Every day | SUBCUTANEOUS | 1 refills | Status: DC

## 2017-08-12 NOTE — Discharge Summary (Signed)
Physician Discharge Summary  Amber Armstrong:[id number] DOB: Aug 29, 1951 DOA: 08/10/2017  PCP: Doree Albee, MD  Admit date: 08/10/2017 Discharge date: 08/12/2017  Admitted From: Home  Disposition:  Home   Recommendations for Outpatient Follow-up:  1. Follow up with PCP in 1-2 weeks 2. Referral sent to Endocrinology and diabetes education 3. Please obtain BMP/CBC in one week 4. Monitor blood sugars 4 times a day (once in am prior to waking, and three times with meals)  Home Health: No Equipment/Devices: None   Discharge Condition: Stable  CODE STATUS: DNR Diet recommendation: Heart Healthy / Carb Modified  Brief/Interim Summary: Amber Tallie Whiteis a 66 y.o.femalewith medical history significant of atrial fibrillation, HTN, Hypothyroidism, Type II diabetes mellitus, and chronic diastolic heart failure (last echocardiogram on file in 2015 with EF of 60%) presented to the ED for nausea and vomiting for the past two days. Patient states she has been vomiting for about three days. Denies hematemesis states emesis looks more like clear mucus. No diarrhea, chills. Patient later reports that she has been having "stomach issues" for 1 week. She mentions she was diagnosed with a urinary tract infection last week and couldn't get her medication until several days later. She checks her blood sugar daily in the morning and for the past two days her meter has read "HIGH". She does not take insulin. Her sister has been sick with flu- like illness of diarrhea and fever. No chest pain, shortness of breath. Does endorse slight abdominal pain on the left side.  Per EDP note "Pt was seen at 1600. Per pt, c/o gradual onset and persistence of multiple intermittent episodes of N/V for the past 2 days. Pt states her home glucose machine has also been reading "high" for the past several days. Pt states she was dx with a "urine infection" last week, but did not pick up her abx until 2 days ago.  Denies CP/SOB, no abd pain, no diarrhea, no black or blood in stools or emesis, no back pain, no fevers". Found to have potassium of 6.4 and given 2L NS, albuterol and calcium as EKG showed peaked T waves. Glucose of >700, Cr of 1.56, hemoconcentration of 15.8/47.5. Urinalysis did not show ketones. After IVF started on insulin drip. Hospitalist asked to admit. CXR showing mild pulmonary congestion.  Patient was transitioned to subcutaneous insulin and blood sugars were monitored.  Her potassium improved and her blood sugars were reasonably controlled.  Discussion about resuming insulin which patient is agreeable to.  She is asking for a referral to endocrinology. She has most of her diabetes supplies at home for insulin as she used to be on it.  She understands how to check her blood sugars and how to administer her insulin.  Discharge Diagnoses:  Principal Problem:   Hyperglycemia Active Problems:   Essential hypertension   FIBRILLATION, ATRIAL   Long term current use of anticoagulant   Dehydration   Hyperglycemia due to type 2 diabetes mellitus ([place])   Acute kidney injury [facility])    Discharge Instructions  Discharge Instructions    Ambulatory referral to Endocrinology    Complete by:  As directed    Call MD for:  difficulty breathing, headache or visual disturbances    Complete by:  As directed    Call MD for:  extreme fatigue    Complete by:  As directed    Call MD for:  hives    Complete by:  As directed    Call MD for:  persistant dizziness or light-headedness    Complete by:  As directed    Call MD for:  persistant nausea and vomiting    Complete by:  As directed    Call MD for:  redness, tenderness, or signs of infection (pain, swelling, redness, odor or green/yellow discharge around incision site)    Complete by:  As directed    Call MD for:  severe uncontrolled pain    Complete by:  As directed    Call MD for:  temperature >100.4    Complete by:  As directed    Diet  - low sodium heart healthy    Complete by:  As directed    Diet Carb Modified    Complete by:  As directed    Discharge instructions    Complete by:  As directed    Get prescription for lantus filled and take as prescribed Referral to endocrinology Monitor blood sugars at home   Increase activity slowly    Complete by:  As directed      Allergies as of 08/12/2017      Reactions   Penicillins Rash      Medication List    STOP taking these medications   cephALEXin 500 MG capsule Commonly known as:  KEFLEX   glipiZIDE 10 MG tablet Commonly known as:  GLUCOTROL     TAKE these medications   amLODipine 10 MG tablet Commonly known as:  NORVASC Take 10 mg by mouth daily.   blood glucose meter kit and supplies Dispense based on patient and insurance preference. Use up to four times daily as directed. (FOR ICD-9 250.00, 250.01).   glucose blood test strip 1 each by Other route 2 (two) times daily. Use as instructed   insulin glargine 100 UNIT/ML injection Commonly known as:  LANTUS Inject 0.17 mLs (17 Units total) into the skin daily.   levothyroxine 150 MCG tablet Commonly known as:  SYNTHROID, LEVOTHROID Take 150 mcg by mouth daily before breakfast.   lisinopril 5 MG tablet Commonly known as:  PRINIVIL,ZESTRIL Take 5 mg by mouth daily.   metoprolol tartrate 50 MG tablet Commonly known as:  LOPRESSOR Take 1.5 tablets (75 mg total) by mouth 2 (two) times daily.   warfarin 3 MG tablet Commonly known as:  COUMADIN Take 1 1/2 tablets daily or as directed      Follow-up Information    Gosrani, Nimish C, MD. Schedule an appointment as soon as possible for a visit in 1 week(s).   Specialty:  Internal Medicine Contact information: [place] [address] [phone]          Allergies  Allergen Reactions  . Penicillins Rash    Consultations:  None   Procedures/Studies: Dg Chest Port 1 View  Result Date: 08/10/2017 CLINICAL DATA:   Kidney infection. EXAM: PORTABLE CHEST 1 VIEW COMPARISON:  04/24/2013. FINDINGS: Stable cardiomegaly. No pulmonary venous congestion. No evidence of pulmonary edema noted on today's exam as noted on prior chest x-ray of 04/24/2013 . No focal infiltrate. No pleural effusion or pneumothorax . IMPRESSION: Stable cardiomegaly. Mild pulmonary venous congestion. No evidence of overt pulmonary edema. Electronically Signed   By: Marcello Moores  Register   On: 08/10/2017 16:39       Subjective: Patient reports she feels fantastic.  Has not felt this well in a long time.  Reports that she understands she will have to go back on insulin and understands how to take insulin.  Asking for a referral to  endocrinology.  Discharge Exam: Vitals:   08/11/17 1853 08/12/17 0615  BP: (!) 152/98 134/89  Pulse: 100 89  Resp: 20 18  Temp: 97.8 F (36.6 C) 97.9 F (36.6 C)  SpO2: 99% 99%   Vitals:   08/11/17 1400 08/11/17 1723 08/11/17 1853 08/12/17 0615  BP: (!) 105/54 131/72 (!) 152/98 134/89  Pulse: 92 82 100 89  Resp: _0 Temp:  98.2 F (36.8 C) 97.8 F (36.6 C) 97.9 F (36.6 C)  TempSrc:  Oral Oral Oral  SpO2: 98% 97% 99% 99%  Weight:   74.8 kg (165 lb)   Height:   5' (1.524 m)     General: Pt is alert, awake, not in acute distress Cardiovascular: IRRR, S1/S2 +, no rubs, no gallops Respiratory: CTA bilaterally, no wheezing, no rhonchi Abdominal: Soft, obese, NT, ND, bowel sounds + Extremities: no edema, no cyanosis    The results of significant diagnostics from this hospitalization (including imaging, microbiology, ancillary and laboratory) are listed below for reference.     Microbiology: Recent Results (from the past 240 hour(s))  Urine culture     Status: None   Collection Time: 08/10/17  3:57 PM  Result Value Ref Range Status   Specimen Description URINE, RANDOM  Final   Special Requests NONE  Final   Culture   Final    NO GROWTH Performed at [facility], [address] [place], [place] [address]    Report Status 08/12/2017 FINAL  Final     Labs: BNP (last 3 results) No results for input(s): BNP in the last 8760 hours. Basic Metabolic Panel:  Recent Labs Lab 08/11/17 0009 08/11/17 0353 08/11/17 0748 08/11/17 1148 08/12/17 0636  NA 139 139 136 135 141  K 3.8 4.8 4.6 4.6 4.5  CL 107 107 103 105 109  CO2 19* 21* 21* 22 22  GLUCOSE 214* 317* 419* 385* 214*  BUN 41* 43* 40* 36* 26*  CREATININE 1.40* 1.42* 1.41* 1.31* 1.21*  CALCIUM 8.6* 8.5* 8.6* 8.6* 8.7*   Liver Function Tests:  Recent Labs Lab 08/10/17 1453  AST 60*  ALT 34  ALKPHOS 154*  BILITOT 0.6  PROT 7.9  ALBUMIN 3.3*    Recent Labs Lab 08/10/17 1453  LIPASE 249*   No results for input(s): AMMONIA in the last 168 hours. CBC:  Recent Labs Lab 08/10/17 1453  WBC 9.4  NEUTROABS 6.9  HGB 15.8*  HCT 47.5*  MCV 83.5  PLT 200   Cardiac Enzymes:  Recent Labs Lab 08/10/17 1453 08/10/17 2034 08/11/17 0009 08/11/17 0353  TROPONINI <0.03 <0.03 <0.03 <0.03   BNP: Invalid input(s): POCBNP CBG:  Recent Labs Lab 08/11/17 1210 08/11/17 1720 08/11/17 2101 08/12/17 0808 08/12/17 1203  GLUCAP 354* 113* 178* 230* 228*   D-Dimer No results for input(s): DDIMER in the last 72 hours. Hgb A1c  Recent Labs  08/11/17 1148  HGBA1C 13.0*   Lipid Profile No results for input(s): CHOL, HDL, LDLCALC, TRIG, CHOLHDL, LDLDIRECT in the last 72 hours. Thyroid function studies No results for input(s): TSH, T4TOTAL, T3FREE, THYROIDAB in the last 72 hours.  Invalid input(s): FREET3 Anemia work up No results for input(s): VITAMINB12, FOLATE, FERRITIN, TIBC, IRON, RETICCTPCT in the last 72 hours. Urinalysis    Component Value Date/Time   COLORURINE YELLOW 08/10/2017 1434   APPEARANCEUR CLOUDY (A) 08/10/2017 1434   LABSPEC 1.016 08/10/2017 1434   PHURINE 5.0 08/10/2017 1434   GLUCOSEU >=500 (A) 08/10/2017 1434  HGBUR LARGE (A) 08/10/2017 1434   BILIRUBINUR NEGATIVE  08/10/2017 1434   [place] 08/10/2017 1434   PROTEINUR NEGATIVE 08/10/2017 1434   UROBILINOGEN 0.2 03/17/2015 1159   NITRITE NEGATIVE 08/10/2017 1434   LEUKOCYTESUR MODERATE (A) 08/10/2017 1434   Sepsis Labs Invalid input(s): PROCALCITONIN,  WBC,  LACTICIDVEN Microbiology Recent Results (from the past 240 hour(s))  Urine culture     Status: None   Collection Time: 08/10/17  3:57 PM  Result Value Ref Range Status   Specimen Description URINE, RANDOM  Final   Special Requests NONE  Final   Culture   Final    NO GROWTH Performed at [facility], [place] [address], [place], [place] [address]    Report Status 08/12/2017 FINAL  Final     Time coordinating discharge: 35 minutes  SIGNED:   Loretha Stapler, MD  [facility] 08/12/2017, 2:14 PM Pager [phone] If 7PM-7AM, please contact night-coverage [email or web address] Password [email or web address]

## 2017-08-12 NOTE — Progress Notes (Signed)
ANTICOAGULATION CONSULT NOTE -  Pharmacy Consult for Warfarin (home med) Indication: atrial fibrillation  Allergies  Allergen Reactions  . Penicillins Rash    Patient Measurements: Height: 5' (152.4 cm) Weight: 165 lb (74.8 kg) IBW/kg (Calculated) : 45.5  Vital Signs: Temp: 97.9 F (36.6 C) (10/20 0615) Temp Source: Oral (10/20 0615) BP: 134/89 (10/20 0615) Pulse Rate: 89 (10/20 0615)  Labs:  Recent Labs  08/10/17 1453  08/10/17 2034 08/11/17 0009 08/11/17 0353 08/11/17 0748 08/11/17 1148 08/12/17 0636  HGB 15.8*  --   --   --   --   --   --   --   HCT 47.5*  --   --   --   --   --   --   --   PLT 200  --   --   --   --   --   --   --   LABPROT 32.7*  --   --   --   --   --  31.0* 27.4*  INR 3.22  --   --   --   --   --  3.01 2.57  CREATININE 1.76*  < >  --  1.40* 1.42* 1.41* 1.31* 1.21*  TROPONINI <0.03  --  <0.03 <0.03 <0.03  --   --   --   < > = values in this interval not displayed.  Estimated Creatinine Clearance: 40.7 mL/min (A) (by C-G formula based on SCr of 1.21 mg/dL (H)).   Medical History: Past Medical History:  Diagnosis Date  . Atrial fibrillation (HCC)   . Chronic diastolic heart failure (HCC)   . Hyperglycemia without ketosis 03/17/2015  . Hypertension   . Hypothyroidism (acquired)   . Nephrolithiasis   . Overweight(278.02)   . Right ventricular hypertrophy 12/12/2012  . Type 2 diabetes mellitus (HCC)    Type II  . UTI (urinary tract infection)     Medications:  Prescriptions Prior to Admission  Medication Sig Dispense Refill Last Dose  . amLODipine (NORVASC) 10 MG tablet Take 10 mg by mouth daily.   08/10/2017 at Unknown time  . cephALEXin (KEFLEX) 500 MG capsule Take 500 mg by mouth 2 (two) times daily. For 5 days   08/10/2017 at Unknown time  . glipiZIDE (GLUCOTROL) 10 MG tablet Take 20 mg by mouth 2 (two) times daily before a meal.   08/10/2017 at Unknown time  . glucose blood test strip 1 each by Other route 2 (two) times daily.  Use as instructed   unknown  . levothyroxine (SYNTHROID, LEVOTHROID) 150 MCG tablet Take 150 mcg by mouth daily before breakfast.   08/10/2017 at Unknown time  . lisinopril (PRINIVIL,ZESTRIL) 5 MG tablet Take 5 mg by mouth daily.   08/09/2017 at Unknown time  . metoprolol (LOPRESSOR) 50 MG tablet Take 1.5 tablets (75 mg total) by mouth 2 (two) times daily. 90 tablet 6 08/10/2017 at 0730  . warfarin (COUMADIN) 3 MG tablet Take 1 1/2 tablets daily or as directed 45 tablet 0 08/10/2017 at 0730    Assessment: 66yo female on Warfarin PTA for h/o afib.  INR > 3 on admission.  INR 2.57 this AM after holding coumadin yesterday  Goal of Therapy:  INR 2-3 Monitor platelets by anticoagulation protocol: Yes   Plan:  Coumadin 4.5 mg po today (home dose) Check INR daily  Amber Armstrong, Biagio Snelson Bennett 08/12/2017,9:32 AM

## 2017-08-12 NOTE — Plan of Care (Signed)
Problem: Tissue Perfusion: Goal: Risk factors for ineffective tissue perfusion will decrease Outcome: Progressing Patient refused SCD's and was educated on the use and importance of them. Patient is taking coumadin.  Problem: Activity: Goal: Risk for activity intolerance will decrease Outcome: Progressing Patient able to ambulate in room with no complications

## 2017-08-12 NOTE — Progress Notes (Signed)
Patient discharged to home taken to car via wheelchair. Discharge instruction given and verbalized understanding.  

## 2017-08-16 DIAGNOSIS — E1165 Type 2 diabetes mellitus with hyperglycemia: Secondary | ICD-10-CM | POA: Diagnosis not present

## 2017-08-16 DIAGNOSIS — E875 Hyperkalemia: Secondary | ICD-10-CM | POA: Diagnosis not present

## 2017-08-16 DIAGNOSIS — I10 Essential (primary) hypertension: Secondary | ICD-10-CM | POA: Diagnosis not present

## 2017-08-21 ENCOUNTER — Ambulatory Visit (INDEPENDENT_AMBULATORY_CARE_PROVIDER_SITE_OTHER): Payer: PPO | Admitting: "Endocrinology

## 2017-08-21 ENCOUNTER — Encounter: Payer: Self-pay | Admitting: "Endocrinology

## 2017-08-21 VITALS — BP 131/80 | HR 75 | Ht 60.0 in | Wt 172.0 lb

## 2017-08-21 DIAGNOSIS — E039 Hypothyroidism, unspecified: Secondary | ICD-10-CM | POA: Diagnosis not present

## 2017-08-21 DIAGNOSIS — E1122 Type 2 diabetes mellitus with diabetic chronic kidney disease: Secondary | ICD-10-CM | POA: Diagnosis not present

## 2017-08-21 DIAGNOSIS — Z794 Long term (current) use of insulin: Secondary | ICD-10-CM | POA: Diagnosis not present

## 2017-08-21 DIAGNOSIS — I10 Essential (primary) hypertension: Secondary | ICD-10-CM | POA: Diagnosis not present

## 2017-08-21 DIAGNOSIS — N183 Chronic kidney disease, stage 3 (moderate): Secondary | ICD-10-CM | POA: Diagnosis not present

## 2017-08-21 MED ORDER — INSULIN GLARGINE 100 UNIT/ML ~~LOC~~ SOLN: 50.0000 [IU] | Freq: Every day | SUBCUTANEOUS | 2 refills | Status: DC

## 2017-08-21 NOTE — Patient Instructions (Signed)

## 2017-08-21 NOTE — Progress Notes (Signed)
Consult Note       08/21/2017, 10:46 AM   Subjective:    Patient ID: Amber Armstrong, female    DOB: Jun 30, 1951.  she is being seen in consultation for management of currently uncontrolled symptomatic diabetes requested by  Doree Albee, MD.   Past Medical History:  Diagnosis Date  . Atrial fibrillation ([place])   . Chronic diastolic heart failure ([place])   . Hyperglycemia without ketosis 03/17/2015  . Hypertension   . Hypothyroidism (acquired)   . Nephrolithiasis   . Overweight(278.02)   . Right ventricular hypertrophy 12/12/2012  . Type 2 diabetes mellitus (HCC)    Type II  . UTI (urinary tract infection)    Past Surgical History:  Procedure Laterality Date  . GROIN DISSECTION Right 04/20/2013   Procedure: GROIN EXPLORATION;  Surgeon: Donato Heinz, MD;  Location: [facility];  Service: General;  Laterality: Right;  . THYROIDECTOMY     Social History   Social History  . Marital status: Divorced    Spouse name: N/A  . Number of children: 0  . Years of education: N/A   Occupational History  . Employed Charity fundraiser   Social History Main Topics  . Smoking status: Never Smoker  . Smokeless tobacco: Never Used  . Alcohol use No  . Drug use: No  . Sexual activity: No   Other Topics Concern  . None   Social History Narrative   Full time pt works at [employer]. Widowed. No regular exercise.   Outpatient Encounter Prescriptions as of 08/21/2017  Medication Sig  . furosemide (LASIX) 20 MG tablet Take 20 mg by mouth 2 (two) times daily.  Marland Kitchen amLODipine (NORVASC) 10 MG tablet Take 10 mg by mouth daily.  . blood glucose meter kit and supplies Dispense based on patient and insurance preference. Use up to four times daily as directed. (FOR ICD-9 250.00, 250.01).  Marland Kitchen glucose blood test strip 1 each by Other route 2 (two) times daily. Use as instructed  . insulin glargine (LANTUS) 100  UNIT/ML injection Inject 0.5 mLs (50 Units total) into the skin at bedtime.  Marland Kitchen levothyroxine (SYNTHROID, LEVOTHROID) 150 MCG tablet Take 150 mcg by mouth daily before breakfast.  . lisinopril (PRINIVIL,ZESTRIL) 5 MG tablet Take 5 mg by mouth daily.  . metoprolol (LOPRESSOR) 50 MG tablet Take 1.5 tablets (75 mg total) by mouth 2 (two) times daily.  Marland Kitchen warfarin (COUMADIN) 3 MG tablet Take 1 1/2 tablets daily or as directed  . [DISCONTINUED] insulin glargine (LANTUS) 100 UNIT/ML injection Inject 0.17 mLs (17 Units total) into the skin daily. (Patient taking differently: Inject 30 Units into the skin daily. )   No facility-administered encounter medications on file as of 08/21/2017.     ALLERGIES: Allergies  Allergen Reactions  . Penicillins Rash    VACCINATION STATUS:  There is no immunization history on file for this patient.  Diabetes  She presents for her initial diabetic visit. She has type 2 diabetes mellitus. Onset time: She was diagnosed at approximate age of 56 years. Her disease course has been worsening (She was recently hospitalized at  which time her A1c was found to be 13%.). There are no hypoglycemic associated symptoms. Pertinent negatives for hypoglycemia include no confusion, headaches, pallor or seizures. Associated symptoms include blurred vision, fatigue, foot paresthesias, polydipsia and polyuria. Pertinent negatives for diabetes include no chest pain and no polyphagia. There are no hypoglycemic complications. Symptoms are worsening. Diabetic complications include nephropathy. Risk factors for coronary artery disease include dyslipidemia, diabetes mellitus, family history, hypertension, obesity and sedentary lifestyle. Current diabetic treatment includes insulin injections. Her weight is decreasing steadily. She is following a generally unhealthy diet. When asked about meal planning, she reported none. She has not had a previous visit with a dietitian. She never participates in  exercise. Her overall blood glucose range is >200 mg/dl. (She brought her log showing random , rare blood glucose monitoring showing readings in 400-500 range.  ) An ACE inhibitor/angiotensin II receptor blocker is being taken. She does not see a podiatrist.Eye exam is not current.  Hypertension  This is a chronic problem. The current episode started more than 1 year ago. The problem is controlled. Associated symptoms include blurred vision. Pertinent negatives include no chest pain, headaches, palpitations or shortness of breath. Risk factors for coronary artery disease include diabetes mellitus, obesity and sedentary lifestyle. Past treatments include ACE inhibitors. Hypertensive end-organ damage includes kidney disease.      Review of Systems  Constitutional: Positive for fatigue. Negative for chills, fever and unexpected weight change.  HENT: Negative for trouble swallowing and voice change.   Eyes: Positive for blurred vision. Negative for visual disturbance.  Respiratory: Negative for cough, shortness of breath and wheezing.   Cardiovascular: Negative for chest pain, palpitations and leg swelling.  Gastrointestinal: Negative for diarrhea, nausea and vomiting.  Endocrine: Positive for polydipsia and polyuria. Negative for cold intolerance, heat intolerance and polyphagia.  Musculoskeletal: Negative for arthralgias and myalgias.  Skin: Negative for color change, pallor, rash and wound.  Neurological: Negative for seizures and headaches.  Psychiatric/Behavioral: Negative for confusion and suicidal ideas.    Objective:    BP 131/80   Pulse 75   Ht 5' (1.524 m)   Wt 172 lb (78 kg)   BMI 33.59 kg/m   Wt Readings from Last 3 Encounters:  08/21/17 172 lb (78 kg)  08/11/17 165 lb (74.8 kg)  10/06/16 183 lb (83 kg)     Physical Exam  Constitutional: She is oriented to person, place, and time. She appears well-developed.  HENT:  Head: Normocephalic and atraumatic.  Poor dental  condition.  Eyes: EOM are normal.  Neck: Normal range of motion. Neck supple. No tracheal deviation present. No thyromegaly present.  Cardiovascular: Normal rate and regular rhythm.   Pulmonary/Chest: Effort normal and breath sounds normal.  Abdominal: Soft. Bowel sounds are normal. There is no tenderness. There is no guarding.  Musculoskeletal: Normal range of motion. She exhibits no edema.  Neurological: She is alert and oriented to person, place, and time. She has normal reflexes. No cranial nerve deficit. Coordination normal.  Skin: Skin is warm and dry. No rash noted. No erythema. No pallor.  Psychiatric: She has a normal mood and affect. Judgment normal.    CMP ( most recent) CMP     Component Value Date/Time   NA 141 08/12/2017 0636   K 4.5 08/12/2017 0636   CL 109 08/12/2017 0636   CO2 22 08/12/2017 0636   GLUCOSE 214 (H) 08/12/2017 0636   BUN 26 (H) 08/12/2017 0636   CREATININE 1.21 (H) 08/12/2017 0636  CREATININE 1.37 (H) 12/25/2013 0801   CALCIUM 8.7 (L) 08/12/2017 0636   PROT 7.9 08/10/2017 1453   ALBUMIN 3.3 (L) 08/10/2017 1453   AST 60 (H) 08/10/2017 1453   ALT 34 08/10/2017 1453   ALKPHOS 154 (H) 08/10/2017 1453   BILITOT 0.6 08/10/2017 1453   GFRNONAA 45 (L) 08/12/2017 0636   GFRAA 52 (L) 08/12/2017 0636     Diabetic Labs (most recent): Lab Results  Component Value Date   HGBA1C 13.0 (H) 08/11/2017   HGBA1C 10.8 (H) 03/17/2015   HGBA1C 5.3 07/01/2013     Lipid Panel ( most recent) Lipid Panel     Component Value Date/Time   CHOL 212 (H) 07/26/2010 2028   TRIG 96 04/23/2013 0451   HDL 43 07/26/2010 2028   CHOLHDL 4.9 Ratio 07/26/2010 2028   VLDL 33 07/26/2010 2028   LDLCALC 136 (H) 07/26/2010 2028      Lab Results  Component Value Date   TSH 0.856 03/17/2015   TSH 28.805 (H) 07/01/2013   TSH 3.470 04/14/2013     Assessment & Plan:   1. Type 2 diabetes mellitus with stage 3 chronic kidney disease, with long-term current use of insulin  ([place])  - Patient has currently uncontrolled symptomatic type 2 DM since  66 years of age,  with most recent A1c of 13 %. It appears that she rapidly lost control of diabetes in the last year. Recent labs reviewed.  -her diabetes is complicated by stage 3 renal insufficiency, obesity/sedentary life and SEMAJ KHAM remains at a high risk for more acute and chronic complications which include CAD, CVA, CKD, retinopathy, and neuropathy. These are all discussed in detail with the patient.  - I have counseled her on diet management and weight loss, by adopting a carbohydrate restricted/protein rich diet.  - Suggestion is made for her to avoid simple carbohydrates  from her diet including Cakes, Sweet Desserts, Ice Cream, Soda (diet and regular), Sweet Tea, Candies, Chips, Cookies, Store Bought Juices, Alcohol in Excess of  1-2 drinks a day, Artificial Sweeteners, and "Sugar-free" Products. This will help patient to have stable blood glucose profile and potentially avoid unintended weight gain.  - I encouraged her to switch to  unprocessed or minimally processed complex starch and increased protein intake (animal or plant source), fruits, and vegetables.  - she is advised to stick to a routine mealtimes to eat 3 meals  a day and avoid unnecessary snacks ( to snack only to correct hypoglycemia).   - she will be scheduled with Jearld Fenton, RDN, CDE for individualized diabetes education.  - I have approached her with the following individualized plan to manage diabetes and patient agrees:   - Given her current glycemic burden, she would require intensive treatment with basal/bolus insulin. -However, it is essential to assure her commitment for proper monitoring of blood glucose for safe use of insulin as well as adequacy of support she may need at home. - Before adding prandial insulin, I like to maximize her basal insulin. - I advised her to increase her Lantus to 50 units daily at bedtime, and  initiate  strict monitoring of glucose 4 times a day-before meals and at bedtime. - She will return in 1 week with her meter and logs for reevaluation.   -Patient is encouraged to call clinic for blood glucose levels less than 70 or above 300 mg /dl. -Patient is not a candidate for metformin, SGLT2 inhibitors due to CKD.  - she will  be considered for incretin therapy as appropriate next visit. - Patient specific target  A1c;  LDL, HDL, Triglycerides, and  Waist Circumference were discussed in detail.  2) BP/HTN:  controlled. Continue current medications including ACEI/ARB. 3) Lipids/HPL:   Uncontrolled, LDL at 136.   Patient is not on  Statins. She will be considered for low-dose statin next visit.   4)  Weight/Diet: CDE Consult will be initiated , exercise, and detailed carbohydrates information provided.   5) hypothyroidism: Unspecified -She is currently on levothyroxine 150 mg by mouth every morning. Her recent labs did not include free T4, TSH was within target.  - We discussed about correct intake of levothyroxine, at fasting, with water, separated by at least 30 minutes from breakfast, and separated by more than 4 hours from calcium, iron, multivitamins, acid reflux medications (PPIs). -Patient is made aware of the fact that thyroid hormone replacement is needed for life, dose to be adjusted by periodic monitoring of thyroid function tests.  6) Chronic Care/Health Maintenance:  -she  is on ACEI/ARB medications and  is encouraged to continue to follow up with Ophthalmology, Dentist,  Podiatrist at least yearly or according to recommendations, and advised to  stay away from smoking. I have recommended yearly flu vaccine and pneumonia vaccination at least every 5 years; moderate intensity exercise for up to 150 minutes weekly; and  sleep for at least 7 hours a day.  - Time spent with the patient: 1 hour, of which >50% was spent in obtaining information about her symptoms, reviewing her  previous labs, evaluations, and treatments, counseling her about her   currently uncontrolled complicated type 2 diabetes, hypothyroidism, hypertension, hyperlipidemia, and developing a plan for long term treatment; her  questions were answered to her satisfaction.  - Patient to bring meter and  blood glucose logs during her next visit.  - I advised patient to maintain close follow up with Doree Albee, MD for primary care needs.  Follow up plan: - Return in about 1 week (around 08/28/2017) for follow up with meter and logs- no labs.  Glade Lloyd, MD [facility] [address] [place], [place] [address] Phone: [phone]  Fax: [phone]    08/21/2017, 10:46 AM  This note was partially dictated with voice recognition software. Similar sounding words can be transcribed inadequately or may not  be corrected upon review.

## 2017-08-24 DIAGNOSIS — I10 Essential (primary) hypertension: Secondary | ICD-10-CM | POA: Diagnosis not present

## 2017-08-24 DIAGNOSIS — E1165 Type 2 diabetes mellitus with hyperglycemia: Secondary | ICD-10-CM | POA: Diagnosis not present

## 2017-08-30 ENCOUNTER — Ambulatory Visit (INDEPENDENT_AMBULATORY_CARE_PROVIDER_SITE_OTHER): Payer: PPO | Admitting: "Endocrinology

## 2017-08-30 ENCOUNTER — Encounter: Payer: Self-pay | Admitting: "Endocrinology

## 2017-08-30 ENCOUNTER — Ambulatory Visit: Payer: PPO | Admitting: "Endocrinology

## 2017-08-30 VITALS — BP 150/86 | Ht 60.0 in | Wt 176.0 lb

## 2017-08-30 DIAGNOSIS — N183 Chronic kidney disease, stage 3 unspecified: Secondary | ICD-10-CM

## 2017-08-30 DIAGNOSIS — E1122 Type 2 diabetes mellitus with diabetic chronic kidney disease: Secondary | ICD-10-CM | POA: Diagnosis not present

## 2017-08-30 DIAGNOSIS — Z794 Long term (current) use of insulin: Secondary | ICD-10-CM | POA: Diagnosis not present

## 2017-08-30 DIAGNOSIS — I10 Essential (primary) hypertension: Secondary | ICD-10-CM

## 2017-08-30 DIAGNOSIS — E039 Hypothyroidism, unspecified: Secondary | ICD-10-CM | POA: Diagnosis not present

## 2017-08-30 MED ORDER — SITAGLIPTIN PHOSPHATE 50 MG PO TABS: 50.0000 mg | ORAL_TABLET | Freq: Every day | ORAL | 2 refills | Status: DC

## 2017-08-30 NOTE — Progress Notes (Signed)
Consult Note       08/30/2017, 11:58 AM   Subjective:    Patient ID: Amber Armstrong, female    DOB: June 18, 1951.  she is being seen in consultation for management of currently uncontrolled symptomatic diabetes requested by  Doree Albee, MD.   Past Medical History:  Diagnosis Date  . Atrial fibrillation ([place])   . Chronic diastolic heart failure ([place])   . Hyperglycemia without ketosis 03/17/2015  . Hypertension   . Hypothyroidism (acquired)   . Nephrolithiasis   . Overweight(278.02)   . Right ventricular hypertrophy 12/12/2012  . Type 2 diabetes mellitus ([facility])    Type II  . UTI (urinary tract infection)    Past Surgical History:  Procedure Laterality Date  . THYROIDECTOMY     Social History   Socioeconomic History  . Marital status: Divorced    Spouse name: None  . Number of children: 0  . Years of education: None  . Highest education level: None  Social Needs  . Financial resource strain: None  . Food insecurity - worry: None  . Food insecurity - inability: None  . Transportation needs - medical: None  . Transportation needs - non-medical: None  Occupational History  . Occupation: Employed    Employer: [employer]    Comment: [employer]  Tobacco Use  . Smoking status: Never Smoker  . Smokeless tobacco: Never Used  Substance and Sexual Activity  . Alcohol use: No  . Drug use: No  . Sexual activity: No  Other Topics Concern  . None  Social History Narrative   Full time pt works at [employer]. Widowed. No regular exercise.   Outpatient Encounter Medications as of 08/30/2017  Medication Sig  . amLODipine (NORVASC) 10 MG tablet Take 10 mg by mouth daily.  . blood glucose meter kit and supplies Dispense based on patient and insurance preference. Use up to four times daily as directed. (FOR ICD-9 250.00, 250.01).  . furosemide (LASIX) 20 MG tablet Take 20 mg by mouth 2 (two) times  daily.  Marland Kitchen glucose blood test strip 1 each by Other route 2 (two) times daily. Use as instructed  . insulin glargine (LANTUS) 100 UNIT/ML injection Inject 0.5 mLs (50 Units total) into the skin at bedtime.  Marland Kitchen levothyroxine (SYNTHROID, LEVOTHROID) 150 MCG tablet Take 150 mcg by mouth daily before breakfast.  . lisinopril (PRINIVIL,ZESTRIL) 5 MG tablet Take 5 mg by mouth daily.  . metoprolol (LOPRESSOR) 50 MG tablet Take 1.5 tablets (75 mg total) by mouth 2 (two) times daily.  . sitaGLIPtin (JANUVIA) 50 MG tablet Take 1 tablet (50 mg total) daily by mouth.  . warfarin (COUMADIN) 3 MG tablet Take 1 1/2 tablets daily or as directed   No facility-administered encounter medications on file as of 08/30/2017.     ALLERGIES: Allergies  Allergen Reactions  . Penicillins Rash    VACCINATION STATUS:  There is no immunization history on file for this patient.  Diabetes  She presents for her follow-up diabetic visit. She has type 2 diabetes mellitus. Onset time: She was diagnosed at approximate age of 66 years. Her disease course has  been improving (She was recently hospitalized at which time her A1c was found to be 13%.). There are no hypoglycemic associated symptoms. Pertinent negatives for hypoglycemia include no confusion, headaches, pallor or seizures. Associated symptoms include blurred vision, fatigue, foot paresthesias, polydipsia and polyuria. Pertinent negatives for diabetes include no chest pain and no polyphagia. There are no hypoglycemic complications. Symptoms are improving. Diabetic complications include nephropathy. Risk factors for coronary artery disease include dyslipidemia, diabetes mellitus, family history, hypertension, obesity and sedentary lifestyle. Current diabetic treatment includes insulin injections. Her weight is increasing steadily. She is following a generally unhealthy diet. When asked about meal planning, she reported none. She has not had a previous visit with a dietitian.  She never participates in exercise. Her breakfast blood glucose range is generally 180-200 mg/dl. Her lunch blood glucose range is generally >200 mg/dl. Her dinner blood glucose range is generally >200 mg/dl. Her bedtime blood glucose range is generally >200 mg/dl. Her overall blood glucose range is >200 mg/dl. (She came with significantly improved glycemic profile, near target fasting profile and postprandial targets between 160 and 250 compared to 400-500 range prior to her last visit.  ) An ACE inhibitor/angiotensin II receptor blocker is being taken. She does not see a podiatrist.Eye exam is not current.  Hypertension  This is a chronic problem. The current episode started more than 1 year ago. The problem is controlled. Associated symptoms include blurred vision. Pertinent negatives include no chest pain, headaches, palpitations or shortness of breath. Risk factors for coronary artery disease include diabetes mellitus, obesity and sedentary lifestyle. Past treatments include ACE inhibitors. Hypertensive end-organ damage includes kidney disease.      Review of Systems  Constitutional: Positive for fatigue. Negative for chills, fever and unexpected weight change.  HENT: Negative for trouble swallowing and voice change.   Eyes: Positive for blurred vision. Negative for visual disturbance.  Respiratory: Negative for cough, shortness of breath and wheezing.   Cardiovascular: Negative for chest pain, palpitations and leg swelling.  Gastrointestinal: Negative for diarrhea, nausea and vomiting.  Endocrine: Positive for polydipsia and polyuria. Negative for cold intolerance, heat intolerance and polyphagia.  Musculoskeletal: Negative for arthralgias and myalgias.  Skin: Negative for color change, pallor, rash and wound.  Neurological: Negative for seizures and headaches.  Psychiatric/Behavioral: Negative for confusion and suicidal ideas.    Objective:    BP (!) 150/86 (BP Location: Right Arm,  Patient Position: Sitting, Cuff Size: Large)   Ht 5' (1.524 m)   Wt 176 lb (79.8 kg)   BMI 34.37 kg/m   Wt Readings from Last 3 Encounters:  08/30/17 176 lb (79.8 kg)  08/21/17 172 lb (78 kg)  08/11/17 165 lb (74.8 kg)     Physical Exam  Constitutional: She is oriented to person, place, and time. She appears well-developed.  HENT:  Head: Normocephalic and atraumatic.  Poor dental condition.  Eyes: EOM are normal.  Neck: Normal range of motion. Neck supple. No tracheal deviation present. No thyromegaly present.  Cardiovascular: Normal rate and regular rhythm.  Pulmonary/Chest: Effort normal and breath sounds normal.  Abdominal: Soft. Bowel sounds are normal. There is no tenderness. There is no guarding.  Musculoskeletal: Normal range of motion. She exhibits no edema.  Neurological: She is alert and oriented to person, place, and time. She has normal reflexes. No cranial nerve deficit. Coordination normal.  Skin: Skin is warm and dry. No rash noted. No erythema. No pallor.  Psychiatric: She has a normal mood and affect. Judgment normal.  CMP ( most recent) CMP     Component Value Date/Time   NA 141 08/12/2017 0636   K 4.5 08/12/2017 0636   CL 109 08/12/2017 0636   CO2 22 08/12/2017 0636   GLUCOSE 214 (H) 08/12/2017 0636   BUN 26 (H) 08/12/2017 0636   CREATININE 1.21 (H) 08/12/2017 0636   CREATININE 1.37 (H) 12/25/2013 0801   CALCIUM 8.7 (L) 08/12/2017 0636   PROT 7.9 08/10/2017 1453   ALBUMIN 3.3 (L) 08/10/2017 1453   AST 60 (H) 08/10/2017 1453   ALT 34 08/10/2017 1453   ALKPHOS 154 (H) 08/10/2017 1453   BILITOT 0.6 08/10/2017 1453   GFRNONAA 45 (L) 08/12/2017 0636   GFRAA 52 (L) 08/12/2017 0636    Diabetic Labs (most recent): Lab Results  Component Value Date   HGBA1C 13.0 (H) 08/11/2017   HGBA1C 10.8 (H) 03/17/2015   HGBA1C 5.3 07/01/2013     Lipid Panel ( most recent) Lipid Panel     Component Value Date/Time   CHOL 212 (H) 07/26/2010 2028   TRIG 96  04/23/2013 0451   HDL 43 07/26/2010 2028   CHOLHDL 4.9 Ratio 07/26/2010 2028   VLDL 33 07/26/2010 2028   LDLCALC 136 (H) 07/26/2010 2028      Lab Results  Component Value Date   TSH 0.856 03/17/2015   TSH 28.805 (H) 07/01/2013   TSH 3.470 04/14/2013     Assessment & Plan:   1. Type 2 diabetes mellitus with stage 3 chronic kidney disease, with long-term current use of insulin ([place])  - Patient has currently uncontrolled symptomatic type 2 DM since  66 years of age,  with most recent A1c of 13 %.  It appears that she rapidly lost control of diabetes in the last year. Recent labs reviewed. - She'll return with controlled fasting blood glucose closer target, still above target postprandial glycemic profile.  -her diabetes is complicated by stage 3 renal insufficiency, obesity/sedentary life and ARIBELLA VAVRA remains at a high risk for more acute and chronic complications which include CAD, CVA, CKD, retinopathy, and neuropathy. These are all discussed in detail with the patient.  - I have counseled her on diet management and weight loss, by adopting a carbohydrate restricted/protein rich diet.  -  Suggestion is made for her to avoid simple carbohydrates  from her diet including Cakes, Sweet Desserts / Pastries, Ice Cream, Soda (diet and regular), Sweet Tea, Candies, Chips, Cookies, Store Bought Juices, Alcohol in Excess of  1-2 drinks a day, Artificial Sweeteners, and "Sugar-free" Products. This will help patient to have stable blood glucose profile and potentially avoid unintended weight gain.   - I encouraged her to switch to  unprocessed or minimally processed complex starch and increased protein intake (animal or plant source), fruits, and vegetables.  - she is advised to stick to a routine mealtimes to eat 3 meals  a day and avoid unnecessary snacks ( to snack only to correct hypoglycemia).   - she will be scheduled with Jearld Fenton, RDN, CDE for individualized diabetes  education- consult pending.  - I have approached her with the following individualized plan to manage diabetes and patient agrees:   - Given her current glycemic burden, she would require intensive treatment with basal/bolus insulin. -However, patient appears to be too overwhelmed to execute this plan. - Based on her current glycemic response, she has a chance to keep her treatment regimen simple .  - I advised her to continue Lantus  50 units  daily at bedtime, and continue strict monitoring of glucose 2 times a day-before breakfast and at bedtime.  -Patient is encouraged to call clinic for blood glucose levels less than 70 or above 300 mg /dl. - I discussed and admitted Januvia 50 mg by mouth daily. Side effects and precautions discussed with her. -Patient is not a candidate for metformin, SGLT2 inhibitors due to CKD.  - Patient specific target  A1c;  LDL, HDL, Triglycerides, and  Waist Circumference were discussed in detail.  2) BP/HTN:  Uncontrolled. I advised her to continue current medications including ACEI/ARB. 3) Lipids/HPL:   Uncontrolled, LDL at 136.   Patient is not on  Statins. She will be considered for low-dose statin next visit.   4)  Weight/Diet: CDE Consult has been initiated , exercise, and detailed carbohydrates information provided.   5) hypothyroidism: Unspecified -She is currently on levothyroxine 150 mg by mouth every morning. Her recent labs did not include free T4, TSH was within target.   - We discussed about correct intake of levothyroxine, at fasting, with water, separated by at least 30 minutes from breakfast, and separated by more than 4 hours from calcium, iron, multivitamins, acid reflux medications (PPIs). -Patient is made aware of the fact that thyroid hormone replacement is needed for life, dose to be adjusted by periodic monitoring of thyroid function tests.  6) Chronic Care/Health Maintenance:  -she  is on ACEI/ARB medications and  is encouraged to  continue to follow up with Ophthalmology, Dentist,  Podiatrist at least yearly or according to recommendations, and advised to  stay away from smoking. I have recommended yearly flu vaccine and pneumonia vaccination at least every 5 years; moderate intensity exercise for up to 150 minutes weekly; and  sleep for at least 7 hours a day.  - I advised patient to maintain close follow up with Doree Albee, MD for primary care needs.  Follow up plan: - Return in about 10 weeks (around 11/08/2017) for follow up with pre-visit labs, meter, and logs.  - Time spent with the patient: 25 min, of which >50% was spent in reviewing her sugar logs , discussing her hypo- and hyper-glycemic episodes, reviewing her current and  previous labs and insulin doses and developing a plan to avoid hypo- and hyper-glycemia.   Glade Lloyd, MD [facility] [address] [place], [place] [address] Phone: [phone]  Fax: [phone]    08/30/2017, 11:58 AM  This note was partially dictated with voice recognition software. Similar sounding words can be transcribed inadequately or may not  be corrected upon review.

## 2017-08-30 NOTE — Patient Instructions (Signed)

## 2017-10-11 ENCOUNTER — Other Ambulatory Visit: Payer: Self-pay | Admitting: "Endocrinology

## 2017-10-11 ENCOUNTER — Encounter: Payer: Self-pay | Admitting: Nutrition

## 2017-10-11 ENCOUNTER — Encounter: Payer: PPO | Attending: "Endocrinology | Admitting: Nutrition

## 2017-10-11 VITALS — Ht 60.0 in | Wt 177.0 lb

## 2017-10-11 DIAGNOSIS — Z713 Dietary counseling and surveillance: Secondary | ICD-10-CM | POA: Insufficient documentation

## 2017-10-11 DIAGNOSIS — E1165 Type 2 diabetes mellitus with hyperglycemia: Secondary | ICD-10-CM

## 2017-10-11 DIAGNOSIS — E1122 Type 2 diabetes mellitus with diabetic chronic kidney disease: Secondary | ICD-10-CM | POA: Diagnosis not present

## 2017-10-11 DIAGNOSIS — E1129 Type 2 diabetes mellitus with other diabetic kidney complication: Secondary | ICD-10-CM

## 2017-10-11 DIAGNOSIS — E669 Obesity, unspecified: Secondary | ICD-10-CM

## 2017-10-11 DIAGNOSIS — N183 Chronic kidney disease, stage 3 (moderate): Secondary | ICD-10-CM | POA: Diagnosis not present

## 2017-10-11 DIAGNOSIS — Z794 Long term (current) use of insulin: Secondary | ICD-10-CM | POA: Diagnosis not present

## 2017-10-11 DIAGNOSIS — IMO0002 Reserved for concepts with insufficient information to code with codable children: Secondary | ICD-10-CM

## 2017-10-11 MED ORDER — INSULIN GLARGINE 100 UNIT/ML SOLOSTAR PEN: 50.0000 [IU] | PEN_INJECTOR | Freq: Every day | SUBCUTANEOUS | 2 refills | Status: DC

## 2017-10-11 NOTE — Progress Notes (Signed)
  Medical Nutrition Therapy:  Appt start time: 1530 end time:  1630.  Assessment:  Primary concerns today: Diabetes. Type 2. LIves with her mom and cares for her mom.  Lantus 50 units a day. Uses insulin vial and syringe. Has knots under her skin at injection site. Not injecting air into vial before withdrawing insulin from vial. Testing twice a day FBS 228 thi sam, BS are usually less than 150  In am and > 250-400's before super/bedtime.  Januvia 50 mg a day. Sees Dr. Fransico Him, for Endocrinology Eats three meals per day.   Lab Results  Component Value Date   HGBA1C 13.0 (H) 08/11/2017    Preferred Learning Style:  No preference indicated   Learning Readiness:    Ready  Change in progress   MEDICATIONS:    DIETARY INTAKE:  24-hr recall:  B ( AM): Egg sandwich, tomato juice,  Snk ( AM):   L ( PM): Pb sprite Snk ( PM):  D ( PM):broccoli soup, cheese sandwich,  Snk ( PM): crackers, 2-3 Beverages: diet sodas  Usual physical activity: ADL  Estimated energy needs: 1200 calories 135 g carbohydrates 90 g protein 33 g fat  Progress Towards Goal(s):  In progress.   Nutritional Diagnosis:  NB-1.1 Food and nutrition-related knowledge deficit As related to Diabetes .  As evidenced by A1C 13%.    Intervention:  Nutrition and Diabetes education provided on My Plate, CHO counting, meal planning, portion sizes, timing of meals, avoiding snacks between meals unless having a low blood sugar, target ranges for A1C and blood sugars, signs/symptoms and treatment of hyper/hypoglycemia, monitoring blood sugars, taking medications as prescribed, benefits of exercising 30 minutes per day and prevention of complications of DM. Marland KitchenGoals 1. Follow My Plate 2 Eat meals times  3. Inject 50 units of air into the syringe before withdrawling  50 units of insulin into the stomach daily. Rotate your site. 4. Drink 5 bottles of water per day 5. Check blood sugars 4 times per day. Get A1C down  7% .  Teaching Method Utilized:  Visual Auditory Hands on  Handouts given during visit include:  The Plate Method   Meal Plan Card  Diabetes Instrucitons.  Barriers to learning/adherence to lifestyle change: none  Demonstrated degree of understanding via:  Teach Back   Monitoring/Evaluation:  Dietary intake, exercise, meal planning, sbg, and body weight in 1 month(s). She would benefit from insulin pens instead of vial and syringe due to better vision and dexterity issues.

## 2017-10-11 NOTE — Patient Instructions (Signed)
Goals 1. Follow My Plate 2 Eat meals times  3. Inject 50 units of air into the syringe before withdrawling  50 units of insulin into the stomach daily. Rotate your site. 4. Drink 5 bottles of water per day 5. Check blood sugars 4 times per day. Get A1C down 7% .

## 2017-10-12 ENCOUNTER — Encounter: Payer: Self-pay | Admitting: Nutrition

## 2017-10-25 DIAGNOSIS — Z794 Long term (current) use of insulin: Secondary | ICD-10-CM | POA: Diagnosis not present

## 2017-10-25 DIAGNOSIS — E039 Hypothyroidism, unspecified: Secondary | ICD-10-CM | POA: Diagnosis not present

## 2017-10-25 DIAGNOSIS — E1122 Type 2 diabetes mellitus with diabetic chronic kidney disease: Secondary | ICD-10-CM | POA: Diagnosis not present

## 2017-10-25 DIAGNOSIS — N183 Chronic kidney disease, stage 3 (moderate): Secondary | ICD-10-CM | POA: Diagnosis not present

## 2017-10-26 ENCOUNTER — Other Ambulatory Visit: Payer: Self-pay

## 2017-10-26 MED ORDER — GLUCOSE BLOOD VI STRP: 1.0000 | ORAL_STRIP | Freq: Four times a day (QID) | 5 refills | Status: DC

## 2017-10-27 LAB — URINE CULTURE
MICRO NUMBER:: 90003364
Result:: NO GROWTH
SPECIMEN QUALITY:: ADEQUATE

## 2017-10-27 LAB — MICROALBUMIN / CREATININE URINE RATIO

## 2017-10-27 LAB — COMPLETE METABOLIC PANEL WITH GFR
AG Ratio: 1.3 (calc) (ref 1.0–2.5)
ALBUMIN MSPROF: 3.9 g/dL (ref 3.6–5.1)
ALT: 15 U/L (ref 6–29)
AST: 23 U/L (ref 10–35)
Alkaline phosphatase (APISO): 79 U/L (ref 33–130)
BILIRUBIN TOTAL: 1 mg/dL (ref 0.2–1.2)
BUN/Creatinine Ratio: 26 (calc) — ABNORMAL HIGH (ref 6–22)
BUN: 35 mg/dL — AB (ref 7–25)
CALCIUM: 9.4 mg/dL (ref 8.6–10.4)
CHLORIDE: 106 mmol/L (ref 98–110)
CO2: 28 mmol/L (ref 20–32)
CREATININE: 1.35 mg/dL — AB (ref 0.50–0.99)
GFR, EST AFRICAN AMERICAN: 47 mL/min/{1.73_m2} — AB (ref >=60)
GFR, Est Non African American: 41 mL/min/{1.73_m2} — ABNORMAL LOW (ref >=60)
GLUCOSE: 106 mg/dL — AB (ref 65–99)
Globulin: 3 g/dL (calc) (ref 1.9–3.7)
Potassium: 5.1 mmol/L (ref 3.5–5.3)
Sodium: 142 mmol/L (ref 135–146)
TOTAL PROTEIN: 6.9 g/dL (ref 6.1–8.1)

## 2017-10-27 LAB — VITAMIN D 25 HYDROXY (VIT D DEFICIENCY, FRACTURES): Vit D, 25-Hydroxy: 24 ng/mL — ABNORMAL LOW (ref 30–100)

## 2017-10-27 LAB — LIPID PANEL
CHOLESTEROL: 156 mg/dL (ref <200)
HDL: 55 mg/dL (ref >50)
LDL CHOLESTEROL (CALC): 87 mg/dL
Non-HDL Cholesterol (Calc): 101 mg/dL (calc) (ref <130)
Total CHOL/HDL Ratio: 2.8 (calc) (ref <5.0)
Triglycerides: 49 mg/dL (ref <150)

## 2017-10-27 LAB — TSH: TSH: 8.63 mIU/L — ABNORMAL HIGH (ref 0.40–4.50)

## 2017-10-27 LAB — HEMOGLOBIN A1C
Hgb A1c MFr Bld: 8.2 % of total Hgb — ABNORMAL HIGH (ref <5.7)
Mean Plasma Glucose: 189 (calc)
eAG (mmol/L): 10.4 (calc)

## 2017-10-27 LAB — T4, FREE: FREE T4: 1.2 ng/dL (ref 0.8–1.8)

## 2017-11-08 ENCOUNTER — Encounter: Payer: PPO | Attending: Internal Medicine | Admitting: Nutrition

## 2017-11-08 ENCOUNTER — Encounter: Payer: Self-pay | Admitting: "Endocrinology

## 2017-11-08 ENCOUNTER — Ambulatory Visit (INDEPENDENT_AMBULATORY_CARE_PROVIDER_SITE_OTHER): Payer: PPO | Admitting: "Endocrinology

## 2017-11-08 VITALS — Ht 60.0 in | Wt 186.0 lb

## 2017-11-08 VITALS — BP 151/96 | HR 68 | Ht 60.0 in | Wt 186.0 lb

## 2017-11-08 DIAGNOSIS — E1122 Type 2 diabetes mellitus with diabetic chronic kidney disease: Secondary | ICD-10-CM | POA: Insufficient documentation

## 2017-11-08 DIAGNOSIS — IMO0002 Reserved for concepts with insufficient information to code with codable children: Secondary | ICD-10-CM

## 2017-11-08 DIAGNOSIS — E1165 Type 2 diabetes mellitus with hyperglycemia: Secondary | ICD-10-CM

## 2017-11-08 DIAGNOSIS — Z794 Long term (current) use of insulin: Secondary | ICD-10-CM | POA: Insufficient documentation

## 2017-11-08 DIAGNOSIS — N183 Chronic kidney disease, stage 3 (moderate): Secondary | ICD-10-CM | POA: Insufficient documentation

## 2017-11-08 DIAGNOSIS — Z713 Dietary counseling and surveillance: Secondary | ICD-10-CM | POA: Diagnosis not present

## 2017-11-08 DIAGNOSIS — E118 Type 2 diabetes mellitus with unspecified complications: Secondary | ICD-10-CM

## 2017-11-08 DIAGNOSIS — E039 Hypothyroidism, unspecified: Secondary | ICD-10-CM | POA: Diagnosis not present

## 2017-11-08 DIAGNOSIS — E559 Vitamin D deficiency, unspecified: Secondary | ICD-10-CM

## 2017-11-08 DIAGNOSIS — E669 Obesity, unspecified: Secondary | ICD-10-CM

## 2017-11-08 LAB — GLUCOSE, POCT (MANUAL RESULT ENTRY): POC Glucose: 157 mg/dl — AB (ref 70–99)

## 2017-11-08 MED ORDER — INSULIN GLARGINE 100 UNIT/ML SOLOSTAR PEN: 40.0000 [IU] | PEN_INJECTOR | Freq: Every day | SUBCUTANEOUS | 2 refills | Status: DC

## 2017-11-08 MED ORDER — VITAMIN D3 125 MCG (5000 UT) PO CAPS: 5000.0000 [IU] | ORAL_CAPSULE | Freq: Every day | ORAL | 0 refills | Status: DC

## 2017-11-08 NOTE — Progress Notes (Signed)
Consult Note       11/08/2017, 1:34 PM   Subjective:    Patient ID: Amber Armstrong, female    DOB: 1951/05/06.  she is being seen in consultation for management of currently uncontrolled symptomatic diabetes requested by  Doree Albee, MD.   Past Medical History:  Diagnosis Date  . Atrial fibrillation ([place])   . Chronic diastolic heart failure ([place])   . Hyperglycemia without ketosis 03/17/2015  . Hypertension   . Hypothyroidism (acquired)   . Nephrolithiasis   . Overweight(278.02)   . Right ventricular hypertrophy 12/12/2012  . Type 2 diabetes mellitus ([facility])    Type II  . UTI (urinary tract infection)    Past Surgical History:  Procedure Laterality Date  . GROIN DISSECTION Right 04/20/2013   Procedure: GROIN EXPLORATION;  Surgeon: Donato Heinz, MD;  Location: [facility];  Service: General;  Laterality: Right;  . THYROIDECTOMY     Social History   Socioeconomic History  . Marital status: Divorced    Spouse name: None  . Number of children: 0  . Years of education: None  . Highest education level: None  Social Needs  . Financial resource strain: None  . Food insecurity - worry: None  . Food insecurity - inability: None  . Transportation needs - medical: None  . Transportation needs - non-medical: None  Occupational History  . Occupation: Employed    Employer: [employer]    Comment: [employer]  Tobacco Use  . Smoking status: Never Smoker  . Smokeless tobacco: Never Used  Substance and Sexual Activity  . Alcohol use: No  . Drug use: No  . Sexual activity: No  Other Topics Concern  . None  Social History Narrative   Full time pt works at [employer]. Widowed. No regular exercise.   Outpatient Encounter Medications as of 11/08/2017  Medication Sig  . amLODipine (NORVASC) 10 MG tablet Take 10 mg by mouth daily.  . blood glucose meter kit and supplies Dispense based on patient and  insurance preference. Use up to four times daily as directed. (FOR ICD-9 250.00, 250.01).  . Cholecalciferol (VITAMIN D3) 5000 units CAPS Take 1 capsule (5,000 Units total) by mouth daily.  . furosemide (LASIX) 20 MG tablet Take 20 mg by mouth 2 (two) times daily.  Marland Kitchen glucose blood test strip 1 each by Other route 2 (two) times daily. Use as instructed  . glucose blood test strip 1 each by Other route 4 (four) times daily. Use as instructed 4 x daily. e11.65  . Insulin Glargine (LANTUS SOLOSTAR) 100 UNIT/ML Solostar Pen Inject 40 Units into the skin daily at 10 pm.  . levothyroxine (SYNTHROID, LEVOTHROID) 150 MCG tablet Take 150 mcg by mouth daily before breakfast.  . lisinopril (PRINIVIL,ZESTRIL) 5 MG tablet Take 5 mg by mouth daily.  . metoprolol (LOPRESSOR) 50 MG tablet Take 1.5 tablets (75 mg total) by mouth 2 (two) times daily.  . sitaGLIPtin (JANUVIA) 50 MG tablet Take 1 tablet (50 mg total) daily by mouth.  . warfarin (COUMADIN) 3 MG tablet Take 1 1/2 tablets daily or as directed  . [DISCONTINUED] Insulin  Glargine (LANTUS SOLOSTAR) 100 UNIT/ML Solostar Pen Inject 50 Units into the skin daily at 10 pm.   No facility-administered encounter medications on file as of 11/08/2017.     ALLERGIES: Allergies  Allergen Reactions  . Penicillins Rash    VACCINATION STATUS:  There is no immunization history on file for this patient.  Diabetes  She presents for her follow-up diabetic visit. She has type 2 diabetes mellitus. Onset time: She was diagnosed at approximate age of 67 years. Her disease course has been improving (She was recently hospitalized at which time her A1c was found to be 13%.). There are no hypoglycemic associated symptoms. Pertinent negatives for hypoglycemia include no confusion, headaches, pallor or seizures. Associated symptoms include blurred vision, fatigue and foot paresthesias. Pertinent negatives for diabetes include no chest pain and no polyphagia. There are no  hypoglycemic complications. Symptoms are improving. Diabetic complications include nephropathy. Risk factors for coronary artery disease include dyslipidemia, diabetes mellitus, family history, hypertension, obesity and sedentary lifestyle. Current diabetic treatment includes insulin injections. Her weight is increasing steadily. She is following a generally unhealthy diet. When asked about meal planning, she reported none. She has not had a previous visit with a dietitian. She never participates in exercise. Her breakfast blood glucose range is generally 140-180 mg/dl. Her lunch blood glucose range is generally 180-200 mg/dl. Her dinner blood glucose range is generally 180-200 mg/dl. Her bedtime blood glucose range is generally 180-200 mg/dl. Her overall blood glucose range is 180-200 mg/dl. (She came with significantly improved glycemic profile, near target fasting profile and postprandial targets between 160 and 250 compared to 400-500 range prior to her last visit.  ) An ACE inhibitor/angiotensin II receptor blocker is being taken. She does not see a podiatrist.Eye exam is not current.  Hypertension  This is a chronic problem. The current episode started more than 1 year ago. The problem is controlled. Associated symptoms include blurred vision. Pertinent negatives include no chest pain, headaches, palpitations or shortness of breath. Risk factors for coronary artery disease include diabetes mellitus, obesity and sedentary lifestyle. Past treatments include ACE inhibitors. Hypertensive end-organ damage includes kidney disease.    Review of Systems  Constitutional: Positive for fatigue. Negative for chills, fever and unexpected weight change.  HENT: Negative for trouble swallowing and voice change.   Eyes: Positive for blurred vision. Negative for visual disturbance.  Respiratory: Negative for cough, shortness of breath and wheezing.   Cardiovascular: Positive for leg swelling. Negative for chest pain  and palpitations.  Gastrointestinal: Negative for diarrhea, nausea and vomiting.  Endocrine: Negative for cold intolerance, heat intolerance and polyphagia.  Musculoskeletal: Negative for arthralgias and myalgias.  Skin: Negative for color change, pallor, rash and wound.  Neurological: Negative for seizures and headaches.  Psychiatric/Behavioral: Negative for confusion and suicidal ideas.    Objective:    BP (!) 151/96   Pulse 68   Ht 5' (1.524 m)   Wt 186 lb (84.4 kg)   BMI 36.33 kg/m   Wt Readings from Last 3 Encounters:  11/08/17 186 lb (84.4 kg)  11/08/17 186 lb (84.4 kg)  10/11/17 177 lb (80.3 kg)     Physical Exam  Constitutional: She is oriented to person, place, and time. She appears well-developed.  HENT:  Head: Normocephalic and atraumatic.  Poor dental condition.  Eyes: EOM are normal.  Neck: Normal range of motion. Neck supple. No tracheal deviation present. No thyromegaly present.  Cardiovascular: Normal rate and regular rhythm.  Pulses:  Dorsalis pedis pulses are 1+ on the right side, and 1+ on the left side.       Posterior tibial pulses are 1+ on the right side, and 1+ on the left side.  She has mild bilateral pretibial edema   Pulmonary/Chest: Effort normal and breath sounds normal.  Abdominal: Soft. Bowel sounds are normal. There is no tenderness. There is no guarding.  Musculoskeletal: Normal range of motion. She exhibits no edema.  Neurological: She is alert and oriented to person, place, and time. She has normal reflexes. No cranial nerve deficit. Coordination normal.  Skin: Skin is warm and dry. No rash noted. No erythema. No pallor.  Psychiatric: She has a normal mood and affect. Judgment normal.    CMP ( most recent) CMP     Component Value Date/Time   NA 142 10/25/2017 1127   K 5.1 10/25/2017 1127   CL 106 10/25/2017 1127   CO2 28 10/25/2017 1127   GLUCOSE 106 (H) 10/25/2017 1127   BUN 35 (H) 10/25/2017 1127   CREATININE 1.35 (H)  10/25/2017 1127   CALCIUM 9.4 10/25/2017 1127   PROT 6.9 10/25/2017 1127   ALBUMIN 3.3 (L) 08/10/2017 1453   AST 23 10/25/2017 1127   ALT 15 10/25/2017 1127   ALKPHOS 154 (H) 08/10/2017 1453   BILITOT 1.0 10/25/2017 1127   GFRNONAA 41 (L) 10/25/2017 1127   GFRAA 47 (L) 10/25/2017 1127    Diabetic Labs (most recent): Lab Results  Component Value Date   HGBA1C 8.2 (H) 10/25/2017   HGBA1C 13.0 (H) 08/11/2017   HGBA1C 10.8 (H) 03/17/2015     Lipid Panel ( most recent) Lipid Panel     Component Value Date/Time   CHOL 156 10/25/2017 1127   TRIG 49 10/25/2017 1127   HDL 55 10/25/2017 1127   CHOLHDL 2.8 10/25/2017 1127   VLDL 33 07/26/2010 2028   LDLCALC 136 (H) 07/26/2010 2028      Lab Results  Component Value Date   TSH 8.63 (H) 10/25/2017   TSH 0.856 03/17/2015   TSH 28.805 (H) 07/01/2013   TSH 3.470 04/14/2013   FREET4 1.2 10/25/2017     Assessment & Plan:   1. Type 2 diabetes mellitus with stage 3 chronic kidney disease, with long-term current use of insulin ([employer])  - Patient has currently uncontrolled symptomatic type 2 DM since  67 years of age. - She came with significantly improved glycemic profile and her A1c has improved to 8.2% from 13%.  Recent labs reviewed.  -her diabetes is complicated by stage 3 renal insufficiency, obesity/sedentary life and JASLIN NOVITSKI remains at a high risk for more acute and chronic complications which include CAD, CVA, CKD, retinopathy, and neuropathy. These are all discussed in detail with the patient.  - I have counseled her on diet management and weight loss, by adopting a carbohydrate restricted/protein rich diet.  -  Suggestion is made for her to avoid simple carbohydrates  from her diet including Cakes, Sweet Desserts / Pastries, Ice Cream, Soda (diet and regular), Sweet Tea, Candies, Chips, Cookies, Store Bought Juices, Alcohol in Excess of  1-2 drinks a day, Artificial Sweeteners, and "Sugar-free" Products. This will help  patient to have stable blood glucose profile and potentially avoid unintended weight gain.  - I encouraged her to switch to  unprocessed or minimally processed complex starch and increased protein intake (animal or plant source), fruits, and vegetables.  - she is advised to stick to a routine mealtimes to eat  3 meals  a day and avoid unnecessary snacks ( to snack only to correct hypoglycemia).   - she will be scheduled with Jearld Fenton, RDN, CDE for individualized diabetes education- consult in progress.  - I have approached her with the following individualized plan to manage diabetes and patient agrees:   - Her fasting blood glucose is tightly controlled, I will proceed to decrease her Lantus to 40 units daily at bedtime. - She is asked to continue to monitor blood glucose at least 2 times a day-daily before breakfast and at bedtime.  -Patient is encouraged to call clinic for blood glucose levels less than 70 or above 300 mg /dl. - I advised her to continue Januvia 50 mg by mouth daily. Side effects and precautions discussed with her. -Patient is not a candidate for metformin, SGLT2 inhibitors due to CKD.  - Patient specific target  A1c;  LDL, HDL, Triglycerides, and  Waist Circumference were discussed in detail.  2) BP/HTN:  Uncontrolled. I advised her to continue current medications including ACEI/ARB. 3) Lipids/HPL:   Controlled, her recent labs show much better LDL of 87 compared to 136 prior to her last visit. Patient is not on statins. She'll be observed without starting for now.    4)  Weight/Diet: CDE Consult is in progress , exercise, and detailed carbohydrates information provided.   5) hypothyroidism: - Her labs suggest inadequate replacement with levothyroxine, however milligrams 150 g is relatively large dose for her body size. - I advised her to continue on current dose of levothyroxine 150 g by mouth every morning.  - We discussed about correct intake of  levothyroxine, at fasting, with water, separated by at least 30 minutes from breakfast, and separated by more than 4 hours from calcium, iron, multivitamins, acid reflux medications (PPIs). -Patient is made aware of the fact that thyroid hormone replacement is needed for life, dose to be adjusted by periodic monitoring of thyroid function tests.  6) Chronic Care/Health Maintenance:  -she  is on ACEI/ARB medications and  is encouraged to continue to follow up with Ophthalmology, Dentist,  Podiatrist at least yearly or according to recommendations, and advised to  stay away from smoking. I have recommended yearly flu vaccine and pneumonia vaccination at least every 5 years; moderate intensity exercise for up to 150 minutes weekly; and  sleep for at least 7 hours a day. - Given her history of CHF, with current mild bilateral lower extremity edema, she is advised to schedule a visit with her cardiologist. - I advised patient to maintain close follow up with Doree Albee, MD for primary care needs.  Follow up plan: - Return in about 3 months (around 02/06/2018) for meter, and logs.  - Time spent with the patient: 25 min, of which >50% was spent in reviewing her blood glucose logs , discussing her hypo- and hyper-glycemic episodes, reviewing her current and  previous labs and insulin doses and developing a plan to avoid hypo- and hyper-glycemia. Please refer to Patient Instructions for Blood Glucose Monitoring and Insulin/Medications Dosing Guide"  in media tab for additional information.   Glade Lloyd, MD [facility] [address] [place], [place] [address] Phone: [phone]  Fax: [phone]    11/08/2017, 1:34 PM  This note was partially dictated with voice recognition software. Similar sounding words can be transcribed inadequately or may not  be corrected upon review.

## 2017-11-08 NOTE — Progress Notes (Signed)
  Medical Nutrition Therapy:  Appt start time: 1000 end time:  1030.  Assessment:  Primary concerns today: Diabetes. Type 2. LIves with her mom and cares for her mom. Saw. Dr. Fransico Him today. A1C down to 8.2%." SInce I am doing the insulin like you told me, my blood sugars are much better. " Lantus 40  units a day. Using an insulin pen. Now rotating insulin site. BS are doing much better now. She feels much better also. Marland KitchenTesting twice a day. FBS 71  this am, BS are usually less than 150  In am and > 250-400's before super/bedtime.  Januvia 50 mg a day. Sees Dr. Fransico Him, for Endocrinology Eats three meals per day.   She is using the insulin pen now and finds it much easier to use and dose insulin correctly.   Lab Results  Component Value Date   HGBA1C 8.2 (H) 10/25/2017    Preferred Learning Style:  No preference indicated   Learning Readiness:    Ready  Change in progress   MEDICATIONS:    DIETARY INTAKE:  24-hr recall:  B ( AM): Egg sandwich, tomato juice,  Snk ( AM):   L ( PM): PB crackers,-2-3, water, Snk ( PM):  D ( PM) Tenderloin biscuit,  water  Snk ( PM): crackers, 2-3 Beverages: diet sodas  Usual physical activity: ADL  Estimated energy needs: 1200 calories 135 g carbohydrates 90 g protein 33 g fat  Progress Towards Goal(s):  In progress.   Nutritional Diagnosis:  NB-1.1 Food and nutrition-related knowledge deficit As related to Diabetes .  As evidenced by A1C 13%.    Intervention:  Nutrition and Diabetes education provided on My Plate, CHO counting, meal planning, portion sizes, timing of meals, avoiding snacks between meals unless having a low blood sugar, target ranges for A1C and blood sugars, signs/symptoms and treatment of hyper/hypoglycemia, monitoring blood sugars, taking medications as prescribed, benefits of exercising 30 minutes per day and prevention of complications of DM. Marland Kitchen .Keep up the good work Eat three balanced meals per day Keep drinking  water Walk when you can. Call Dr. Fransico Him if BS are in the 70's a few times a week. Eat 2-3 carb choice per meal  Don't skip meals.  Teaching Method Utilized:  Visual Auditory Hands on  Handouts given during visit include:  The Plate Method   Meal Plan Card  Diabetes Instrucitons.  Barriers to learning/adherence to lifestyle change: none  Demonstrated degree of understanding via:  Teach Back   Monitoring/Evaluation:  Dietary intake, exercise, meal planning, sbg, and body weight in 3 month(s).

## 2017-11-08 NOTE — Patient Instructions (Signed)

## 2017-11-14 ENCOUNTER — Encounter: Payer: Self-pay | Admitting: Nutrition

## 2017-11-14 NOTE — Patient Instructions (Signed)
Keep up the good work Eat three balanced meals per day Keep drinking water Walk when you can. Call Dr. Fransico Him if BS are in the 70's a few times a week. Eat 2-3 carb choice per meal  Don't skip meals.

## 2017-11-22 ENCOUNTER — Telehealth: Payer: Self-pay | Admitting: "Endocrinology

## 2017-11-22 NOTE — Telephone Encounter (Signed)
Amber Armstrong is stating that her blood sugars are running low Sat 01/26 Pm 105  Sun 1/27 Pm-66  Mon 01/28 Pm-66  Tues 01/29 Pm-120  Please advise of any changes

## 2017-11-22 NOTE — Telephone Encounter (Signed)
Advise her to lower her Lantus to 30 units nightly, Continue to test blood glucose twice a day before breakfast and at bedtime and report if readings are still low below 70.

## 2017-11-22 NOTE — Telephone Encounter (Signed)
Patient is aware of the recommendation 

## 2017-11-25 ENCOUNTER — Other Ambulatory Visit: Payer: Self-pay | Admitting: "Endocrinology

## 2017-11-29 DIAGNOSIS — E1165 Type 2 diabetes mellitus with hyperglycemia: Secondary | ICD-10-CM | POA: Diagnosis not present

## 2017-11-29 DIAGNOSIS — I10 Essential (primary) hypertension: Secondary | ICD-10-CM | POA: Diagnosis not present

## 2017-11-29 DIAGNOSIS — E039 Hypothyroidism, unspecified: Secondary | ICD-10-CM | POA: Diagnosis not present

## 2018-01-08 DIAGNOSIS — S81809A Unspecified open wound, unspecified lower leg, initial encounter: Secondary | ICD-10-CM | POA: Diagnosis not present

## 2018-01-15 DIAGNOSIS — I10 Essential (primary) hypertension: Secondary | ICD-10-CM | POA: Diagnosis not present

## 2018-01-15 DIAGNOSIS — I482 Chronic atrial fibrillation: Secondary | ICD-10-CM | POA: Diagnosis not present

## 2018-01-15 DIAGNOSIS — E1165 Type 2 diabetes mellitus with hyperglycemia: Secondary | ICD-10-CM | POA: Diagnosis not present

## 2018-01-15 DIAGNOSIS — L03818 Cellulitis of other sites: Secondary | ICD-10-CM | POA: Diagnosis not present

## 2018-01-17 DIAGNOSIS — L0390 Cellulitis, unspecified: Secondary | ICD-10-CM | POA: Diagnosis not present

## 2018-01-17 DIAGNOSIS — S81809A Unspecified open wound, unspecified lower leg, initial encounter: Secondary | ICD-10-CM | POA: Diagnosis not present

## 2018-01-30 DIAGNOSIS — I10 Essential (primary) hypertension: Secondary | ICD-10-CM | POA: Diagnosis not present

## 2018-01-30 DIAGNOSIS — E039 Hypothyroidism, unspecified: Secondary | ICD-10-CM | POA: Diagnosis not present

## 2018-01-30 DIAGNOSIS — E119 Type 2 diabetes mellitus without complications: Secondary | ICD-10-CM | POA: Diagnosis not present

## 2018-01-30 LAB — BASIC METABOLIC PANEL
BUN: 28 — AB (ref 4–21)
Creatinine: 1.3 — AB (ref 0.5–1.1)

## 2018-01-30 LAB — TSH: TSH: 7.46 — AB (ref <=5.90)

## 2018-01-30 LAB — HEMOGLOBIN A1C: Hemoglobin A1C: 6.6

## 2018-02-07 ENCOUNTER — Ambulatory Visit (INDEPENDENT_AMBULATORY_CARE_PROVIDER_SITE_OTHER): Payer: PPO | Admitting: "Endocrinology

## 2018-02-07 ENCOUNTER — Encounter: Payer: Self-pay | Admitting: "Endocrinology

## 2018-02-07 ENCOUNTER — Encounter: Payer: PPO | Attending: Internal Medicine | Admitting: Nutrition

## 2018-02-07 VITALS — BP 135/88 | HR 65 | Resp 16 | Ht 60.0 in | Wt 184.6 lb

## 2018-02-07 VITALS — Ht 60.0 in | Wt 184.0 lb

## 2018-02-07 DIAGNOSIS — E1165 Type 2 diabetes mellitus with hyperglycemia: Secondary | ICD-10-CM | POA: Diagnosis not present

## 2018-02-07 DIAGNOSIS — I10 Essential (primary) hypertension: Secondary | ICD-10-CM

## 2018-02-07 DIAGNOSIS — Z713 Dietary counseling and surveillance: Secondary | ICD-10-CM | POA: Diagnosis not present

## 2018-02-07 DIAGNOSIS — E559 Vitamin D deficiency, unspecified: Secondary | ICD-10-CM | POA: Diagnosis not present

## 2018-02-07 DIAGNOSIS — E039 Hypothyroidism, unspecified: Secondary | ICD-10-CM | POA: Diagnosis not present

## 2018-02-07 DIAGNOSIS — N183 Chronic kidney disease, stage 3 (moderate): Secondary | ICD-10-CM | POA: Insufficient documentation

## 2018-02-07 DIAGNOSIS — E118 Type 2 diabetes mellitus with unspecified complications: Secondary | ICD-10-CM

## 2018-02-07 DIAGNOSIS — IMO0002 Reserved for concepts with insufficient information to code with codable children: Secondary | ICD-10-CM

## 2018-02-07 DIAGNOSIS — E1122 Type 2 diabetes mellitus with diabetic chronic kidney disease: Secondary | ICD-10-CM

## 2018-02-07 DIAGNOSIS — E669 Obesity, unspecified: Secondary | ICD-10-CM

## 2018-02-07 DIAGNOSIS — Z794 Long term (current) use of insulin: Secondary | ICD-10-CM | POA: Diagnosis not present

## 2018-02-07 MED ORDER — INSULIN GLARGINE 100 UNIT/ML SOLOSTAR PEN: 20.0000 [IU] | PEN_INJECTOR | Freq: Every day | SUBCUTANEOUS | 2 refills | Status: DC

## 2018-02-07 NOTE — Progress Notes (Signed)
  Medical Nutrition Therapy:  Appt start time: 1515 end time:  1545  Assessment:  Primary concerns today: Diabetes. Type 2. LIves with her mom and cares for her mom. Saw. Dr. Fransico Him today. A1C down to 6.6%.  Dr. Fransico Him reduced her lantus down to 20 units a day now. Still onl Januvia. FBS 80's. Before bedtime 200's. Has had a few lower blood sugars before breakfast. May have a low blood sugar if she goes too long between meals or eats dinner late.  Using an insulin pen.  BS are doing much better now. She feels much better also. Still caring for her 26 yr old mom. Works 2 days a week at [employer]. .Testing twice a day. Januvia 50 mg a day. Sees Dr. Fransico Him, for Endocrinology Eats three meals per day.    Lab Results  Component Value Date   HGBA1C 8.2 (H) 10/25/2017    Preferred Learning Style:  No preference indicated   Learning Readiness:    Ready  Change in progress   MEDICATIONS: see list   DIETARY INTAKE:  24-hr recall:  B ( AM): Egg sandwich, tomato juice,  Snk ( AM):   L ( PM): minced bbq sandwich, water Snk ( PM):  D ( PM)  Tenderloin biscuit 1/2. Snk ( PM): crackers, 2-3 Beverages: diet sodas  Usual physical activity: ADL  Estimated energy needs: 1200 calories 135 g carbohydrates 90 g protein 33 g fat  Progress Towards Goal(s):  In progress.   Nutritional Diagnosis:  NB-1.1 Food and nutrition-related knowledge deficit As related to Diabetes .  As evidenced by A1C 13%.    Intervention:  Nutrition and Diabetes education provided on My Plate, CHO counting, meal planning, portion sizes, timing of meals, avoiding snacks between meals unless having a low blood sugar, target ranges for A1C and blood sugars, signs/symptoms and treatment of hyper/hypoglycemia, monitoring blood sugars, taking medications as prescribed, benefits of exercising 30 minutes per day and prevention of complications of DM. Marland Kitchen Goals Keep up the [facility] JOB!!!  Increase lower carb vegetables Keep  drinking water Let Dr. Fransico Him know if you BS are below 80 more than 2 times per week  Teaching Method Utilized:  Visual Auditory Hands on  Handouts given during visit include:  The Plate Method   Meal Plan Card  Diabetes Instrucitons.  Barriers to learning/adherence to lifestyle change: none  Demonstrated degree of understanding via:  Teach Back   Monitoring/Evaluation:  Dietary intake, exercise, meal planning, sbg, and body weight in 4 month(s).

## 2018-02-07 NOTE — Progress Notes (Signed)
Endocrinology follow-up note       02/07/2018, 3:30 PM   Subjective:    Patient ID: Amber Armstrong, female    DOB: [birth year].  she is being seen in follow-up for management of currently uncontrolled symptomatic diabetes requested by  Doree Albee, MD.   Past Medical History:  Diagnosis Date  . Atrial fibrillation ([place])   . Chronic diastolic heart failure ([place])   . Hyperglycemia without ketosis 03/17/2015  . Hypertension   . Hypothyroidism (acquired)   . Nephrolithiasis   . Overweight(278.02)   . Right ventricular hypertrophy 12/12/2012  . Type 2 diabetes mellitus ([facility])    Type II  . UTI (urinary tract infection)    Past Surgical History:  Procedure Laterality Date  . GROIN DISSECTION Right 04/20/2013   Procedure: GROIN EXPLORATION;  Surgeon: Donato Heinz, MD;  Location: [facility];  Service: General;  Laterality: Right;  . THYROIDECTOMY     Social History   Socioeconomic History  . Marital status: Divorced    Spouse name: Not on file  . Number of children: 0  . Years of education: Not on file  . Highest education level: Not on file  Occupational History  . Occupation: Employed    Employer: [employer]    Comment: [employer]  Social Needs  . Financial resource strain: Not on file  . Food insecurity:    Worry: Not on file    Inability: Not on file  . Transportation needs:    Medical: Not on file    Non-medical: Not on file  Tobacco Use  . Smoking status: Never Smoker  . Smokeless tobacco: Never Used  Substance and Sexual Activity  . Alcohol use: No  . Drug use: No  . Sexual activity: Never  Lifestyle  . Physical activity:    Days per week: Not on file    Minutes per session: Not on file  . Stress: Not on file  Relationships  . Social connections:    Talks on phone: Not on file    Gets together: Not on file    Attends religious service: Not on file    Active member of club or organization: Not on  file    Attends meetings of clubs or organizations: Not on file    Relationship status: Not on file  Other Topics Concern  . Not on file  Social History Narrative   Full time pt works at [employer]. Widowed. No regular exercise.   Outpatient Encounter Medications as of 02/07/2018  Medication Sig  . amLODipine (NORVASC) 10 MG tablet Take 10 mg by mouth daily.  . blood glucose meter kit and supplies Dispense based on patient and insurance preference. Use up to four times daily as directed. (FOR ICD-9 250.00, 250.01).  . Cholecalciferol (VITAMIN D3) 5000 units CAPS Take 1 capsule (5,000 Units total) by mouth daily.  . furosemide (LASIX) 20 MG tablet Take 20 mg by mouth 2 (two) times daily.  Marland Kitchen glucose blood test strip 1 each by Other route 2 (two) times daily. Use as instructed  . glucose blood test strip 1 each by Other route 4 (four) times daily. Use as instructed 4 x daily. e11.65  . Insulin Glargine (LANTUS SOLOSTAR) 100 UNIT/ML  Solostar Pen Inject 20 Units into the skin daily at 10 pm.  . JANUVIA 50 MG tablet TAKE 1 TABLET BY MOUTH ONCE DAILY  . levothyroxine (SYNTHROID, LEVOTHROID) 150 MCG tablet Take 150 mcg by mouth daily before breakfast.  . lisinopril (PRINIVIL,ZESTRIL) 5 MG tablet Take 5 mg by mouth daily.  . metoprolol (LOPRESSOR) 50 MG tablet Take 1.5 tablets (75 mg total) by mouth 2 (two) times daily.  Marland Kitchen warfarin (COUMADIN) 3 MG tablet Take 1 1/2 tablets daily or as directed  . [DISCONTINUED] Insulin Glargine (LANTUS SOLOSTAR) 100 UNIT/ML Solostar Pen Inject 40 Units into the skin daily at 10 pm.   No facility-administered encounter medications on file as of 02/07/2018.     ALLERGIES: Allergies  Allergen Reactions  . Penicillins Rash    VACCINATION STATUS:  There is no immunization history on file for this patient.  Diabetes  She presents for her follow-up diabetic visit. She has type 2 diabetes mellitus. Onset time: She was diagnosed at approximate age of 67  years. Her disease course has been improving (She was recently hospitalized at which time her A1c was found to be 13%.). There are no hypoglycemic associated symptoms. Pertinent negatives for hypoglycemia include no confusion, headaches, pallor or seizures. Associated symptoms include blurred vision, fatigue and foot paresthesias. Pertinent negatives for diabetes include no chest pain and no polyphagia. There are no hypoglycemic complications. Symptoms are improving. Diabetic complications include nephropathy. Risk factors for coronary artery disease include dyslipidemia, diabetes mellitus, family history, hypertension, obesity and sedentary lifestyle. Current diabetic treatment includes insulin injections. Her weight is stable. She is following a generally unhealthy diet. When asked about meal planning, she reported none. She has not had a previous visit with a dietitian. She never participates in exercise. Her breakfast blood glucose range is generally 90-110 mg/dl. Her lunch blood glucose range is generally 180-200 mg/dl. Her dinner blood glucose range is generally 180-200 mg/dl. Her bedtime blood glucose range is generally 130-140 mg/dl. Her overall blood glucose range is 130-140 mg/dl. (She came with significantly improved glycemic profile, near target fasting profile and postprandial targets between 160 and 250 compared to 400-500 range prior to her last visit.  ) An ACE inhibitor/angiotensin II receptor blocker is being taken. She does not see a podiatrist.Eye exam is not current.  Hypertension  This is a chronic problem. The current episode started more than 1 year ago. The problem is controlled. Associated symptoms include blurred vision. Pertinent negatives include no chest pain, headaches, palpitations or shortness of breath. Risk factors for coronary artery disease include diabetes mellitus, obesity and sedentary lifestyle. Past treatments include ACE inhibitors. Hypertensive end-organ damage includes  kidney disease.    Review of Systems  Constitutional: Positive for fatigue. Negative for chills, fever and unexpected weight change.  HENT: Negative for trouble swallowing and voice change.   Eyes: Positive for blurred vision. Negative for visual disturbance.  Respiratory: Negative for cough, shortness of breath and wheezing.   Cardiovascular: Positive for leg swelling. Negative for chest pain and palpitations.  Gastrointestinal: Negative for diarrhea, nausea and vomiting.  Endocrine: Negative for cold intolerance, heat intolerance and polyphagia.  Musculoskeletal: Negative for arthralgias and myalgias.  Skin: Negative for color change, pallor, rash and wound.  Neurological: Negative for seizures and headaches.  Psychiatric/Behavioral: Negative for confusion and suicidal ideas.    Objective:    BP 135/88 (BP Location: Left Arm, Patient Position: Sitting, Cuff Size: Normal)   Pulse 65   Resp 16   Ht  5' (1.524 m)   Wt 184 lb 9.6 oz (83.7 kg)   SpO2 94%   BMI 36.05 kg/m   Wt Readings from Last 3 Encounters:  02/07/18 184 lb (83.5 kg)  02/07/18 184 lb 9.6 oz (83.7 kg)  11/08/17 186 lb (84.4 kg)     Physical Exam  Constitutional: She is oriented to person, place, and time. She appears well-developed.  HENT:  Head: Normocephalic and atraumatic.  Poor dental condition.  Eyes: EOM are normal.  Neck: Normal range of motion. Neck supple. No tracheal deviation present. No thyromegaly present.  Cardiovascular: Normal rate and regular rhythm.  Pulses:      Dorsalis pedis pulses are 1+ on the right side, and 1+ on the left side.       Posterior tibial pulses are 1+ on the right side, and 1+ on the left side.  She has mild bilateral pretibial edema   Pulmonary/Chest: Effort normal and breath sounds normal.  Abdominal: Soft. Bowel sounds are normal. There is no tenderness. There is no guarding.  Musculoskeletal: Normal range of motion. She exhibits no edema.  Neurological: She is  alert and oriented to person, place, and time. She has normal reflexes. No cranial nerve deficit. Coordination normal.  Skin: Skin is warm and dry. No rash noted. No erythema. No pallor.  Psychiatric: She has a normal mood and affect. Judgment normal.    CMP ( most recent) CMP     Component Value Date/Time   NA 142 10/25/2017 1127   K 5.1 10/25/2017 1127   CL 106 10/25/2017 1127   CO2 28 10/25/2017 1127   GLUCOSE 106 (H) 10/25/2017 1127   BUN 28 (A) 01/30/2018   CREATININE 1.3 (A) 01/30/2018   CREATININE 1.35 (H) 10/25/2017 1127   CALCIUM 9.4 10/25/2017 1127   PROT 6.9 10/25/2017 1127   ALBUMIN 3.3 (L) 08/10/2017 1453   AST 23 10/25/2017 1127   ALT 15 10/25/2017 1127   ALKPHOS 154 (H) 08/10/2017 1453   BILITOT 1.0 10/25/2017 1127   GFRNONAA 41 (L) 10/25/2017 1127   GFRAA 47 (L) 10/25/2017 1127    Diabetic Labs (most recent): Lab Results  Component Value Date   HGBA1C 6.6 01/30/2018   HGBA1C 8.2 (H) 10/25/2017   HGBA1C 13.0 (H) 08/11/2017     Lipid Panel ( most recent) Lipid Panel     Component Value Date/Time   CHOL 156 10/25/2017 1127   TRIG 49 10/25/2017 1127   HDL 55 10/25/2017 1127   CHOLHDL 2.8 10/25/2017 1127   VLDL 33 07/26/2010 2028   LDLCALC 87 10/25/2017 1127      Lab Results  Component Value Date   TSH 7.46 (A) 01/30/2018   TSH 8.63 (H) 10/25/2017   TSH 0.856 03/17/2015   TSH 28.805 (H) 07/01/2013   TSH 3.470 04/14/2013   FREET4 1.2 10/25/2017     Assessment & Plan:   1. Type 2 diabetes mellitus with stage 3 chronic kidney disease, with long-term current use of insulin ([place])  - Patient has currently uncontrolled symptomatic type 2 DM since  67 years of age. - She came with significantly improved glycemic profile and her A1c 6.6%, progressively improving from 13%.    Recent labs reviewed.  -her diabetes is complicated by stage 3 renal insufficiency, obesity/sedentary life and AUNYA LEMLER remains at a high risk for more acute and chronic  complications which include CAD, CVA, CKD, retinopathy, and neuropathy. These are all discussed in detail with the patient.  -  I have counseled her on diet management and weight loss, by adopting a carbohydrate restricted/protein rich diet.  -  Suggestion is made for her to avoid simple carbohydrates  from her diet including Cakes, Sweet Desserts / Pastries, Ice Cream, Soda (diet and regular), Sweet Tea, Candies, Chips, Cookies, Store Bought Juices, Alcohol in Excess of  1-2 drinks a day, Artificial Sweeteners, and "Sugar-free" Products. This will help patient to have stable blood glucose profile and potentially avoid unintended weight gain.   - I encouraged her to switch to  unprocessed or minimally processed complex starch and increased protein intake (animal or plant source), fruits, and vegetables.  - she is advised to stick to a routine mealtimes to eat 3 meals  a day and avoid unnecessary snacks ( to snack only to correct hypoglycemia).   - she will be scheduled with Jearld Fenton, RDN, CDE for individualized diabetes education- consult in progress.  - I have approached her with the following individualized plan to manage diabetes and patient agrees:   - Her fasting blood glucose is tightly controlled, I will proceed to decrease her Lantus to 20 units daily at bedtime. - She is asked to continue to monitor blood glucose at least 2 times a day-daily before breakfast and at bedtime.  -Patient is encouraged to call clinic for blood glucose levels less than 70 or above 300 mg /dl. - I advised her to continue Januvia 50 mg by mouth daily. Side effects and precautions discussed with her. -Patient is not a candidate for metformin, SGLT2 inhibitors due to CKD.  - Patient specific target  A1c;  LDL, HDL, Triglycerides, and  Waist Circumference were discussed in detail.  2) BP/HTN: Her blood pressure is controlled to target. I advised her to continue current medications including ACEI/ARB. 3)  Lipids/HPL:   Controlled, her recent labs show much better LDL of 87 compared to 136 prior to her last visit. Patient is not on statins. She'll be observed without starting for now.    4)  Weight/Diet: CDE Consult is in progress , exercise, and detailed carbohydrates information provided.   5) hypothyroidism: -Her labs show above target TSH, however free T4 is within target. - I advised her to continue on current dose of levothyroxine 150 g by mouth every morning.  - We discussed about correct intake of levothyroxine, at fasting, with water, separated by at least 30 minutes from breakfast, and separated by more than 4 hours from calcium, iron, multivitamins, acid reflux medications (PPIs). -Patient is made aware of the fact that thyroid hormone replacement is needed for life, dose to be adjusted by periodic monitoring of thyroid function tests.  6) Chronic Care/Health Maintenance:  -she  is on ACEI/ARB medications and  is encouraged to continue to follow up with Ophthalmology, Dentist,  Podiatrist at least yearly or according to recommendations, and advised to  stay away from smoking. I have recommended yearly flu vaccine and pneumonia vaccination at least every 5 years; moderate intensity exercise for up to 150 minutes weekly; and  sleep for at least 7 hours a day.  - Given her history of CHF, with current mild bilateral lower extremity edema, she is advised to schedule a visit with her cardiologist. - I advised patient to maintain close follow up with Doree Albee, MD for primary care needs.   - Time spent with the patient: 25 min, of which >50% was spent in reviewing her blood glucose logs , discussing her hypo- and hyper-glycemic episodes, reviewing  her current and  previous labs and insulin doses and developing a plan to avoid hypo- and hyper-glycemia. Please refer to Patient Instructions for Blood Glucose Monitoring and Insulin/Medications Dosing Guide"  in media tab for additional  information. Shireen Quan participated in the discussions, expressed understanding, and voiced agreement with the above plans.  All questions were answered to her satisfaction. she is encouraged to contact clinic should she have any questions or concerns prior to her return visit.  Follow up plan: - Return in about 4 months (around 06/09/2018) for follow up with pre-visit labs, meter, and logs.   Glade Lloyd, MD [facility] [address] [place], [place] [address] Phone: [phone]  Fax: [phone]    02/07/2018, 3:30 PM  This note was partially dictated with voice recognition software. Similar sounding words can be transcribed inadequately or may not  be corrected upon review.

## 2018-02-07 NOTE — Patient Instructions (Addendum)
Goals Keep up the [facility] JOB!!!  Increase lower carb vegetables Keep drinking water Let Dr. Fransico Him know if you BS are below 80 more than 2 times per week. Prevent low blood sugars.

## 2018-02-07 NOTE — Patient Instructions (Signed)

## 2018-02-08 ENCOUNTER — Encounter: Payer: Self-pay | Admitting: Nutrition

## 2018-02-22 ENCOUNTER — Other Ambulatory Visit: Payer: Self-pay | Admitting: "Endocrinology

## 2018-02-24 ENCOUNTER — Other Ambulatory Visit: Payer: Self-pay | Admitting: "Endocrinology

## 2018-03-07 ENCOUNTER — Ambulatory Visit: Payer: Self-pay | Admitting: *Deleted

## 2018-03-07 DIAGNOSIS — Z5181 Encounter for therapeutic drug level monitoring: Secondary | ICD-10-CM

## 2018-03-07 DIAGNOSIS — I4891 Unspecified atrial fibrillation: Secondary | ICD-10-CM

## 2018-03-26 ENCOUNTER — Telehealth: Payer: Self-pay

## 2018-03-26 NOTE — Telephone Encounter (Signed)
She can stop [place], and  get some samples for the lantus / replacement to cover until next visit.

## 2018-03-26 NOTE — Telephone Encounter (Signed)
Januvia now has a copay of $140 and Lantus has a copay of $145. She is requesting new prescriptions. She is in the "doughnut hole"

## 2018-03-26 NOTE — Telephone Encounter (Signed)
Pt.notified

## 2018-05-21 ENCOUNTER — Ambulatory Visit: Payer: PPO | Admitting: "Endocrinology

## 2018-05-30 DIAGNOSIS — E6609 Other obesity due to excess calories: Secondary | ICD-10-CM | POA: Diagnosis not present

## 2018-05-30 DIAGNOSIS — I10 Essential (primary) hypertension: Secondary | ICD-10-CM | POA: Diagnosis not present

## 2018-05-30 DIAGNOSIS — E1165 Type 2 diabetes mellitus with hyperglycemia: Secondary | ICD-10-CM | POA: Diagnosis not present

## 2018-05-30 LAB — BASIC METABOLIC PANEL
BUN: 31 — AB (ref 4–21)
CREATININE: 1.6 — AB (ref 0.5–1.1)

## 2018-05-30 LAB — HEMOGLOBIN A1C: Hemoglobin A1C: 6.4

## 2018-06-13 ENCOUNTER — Ambulatory Visit: Payer: PPO | Admitting: Nutrition

## 2018-06-13 ENCOUNTER — Encounter: Payer: Self-pay | Admitting: "Endocrinology

## 2018-06-13 ENCOUNTER — Ambulatory Visit (INDEPENDENT_AMBULATORY_CARE_PROVIDER_SITE_OTHER): Payer: PPO | Admitting: "Endocrinology

## 2018-06-13 VITALS — BP 128/57 | HR 87 | Ht 60.0 in | Wt 201.0 lb

## 2018-06-13 DIAGNOSIS — E559 Vitamin D deficiency, unspecified: Secondary | ICD-10-CM | POA: Diagnosis not present

## 2018-06-13 DIAGNOSIS — E1122 Type 2 diabetes mellitus with diabetic chronic kidney disease: Secondary | ICD-10-CM | POA: Diagnosis not present

## 2018-06-13 DIAGNOSIS — E039 Hypothyroidism, unspecified: Secondary | ICD-10-CM

## 2018-06-13 DIAGNOSIS — IMO0002 Reserved for concepts with insufficient information to code with codable children: Secondary | ICD-10-CM

## 2018-06-13 DIAGNOSIS — I10 Essential (primary) hypertension: Secondary | ICD-10-CM | POA: Diagnosis not present

## 2018-06-13 DIAGNOSIS — E1165 Type 2 diabetes mellitus with hyperglycemia: Secondary | ICD-10-CM

## 2018-06-13 DIAGNOSIS — N183 Chronic kidney disease, stage 3 (moderate): Secondary | ICD-10-CM

## 2018-06-13 NOTE — Progress Notes (Signed)
Endocrinology follow-up note       06/13/2018, 2:40 PM   Subjective:    Patient ID: Amber Armstrong, female    DOB: 01/11/1951.  she is being seen in follow-up for management of currently uncontrolled symptomatic diabetes requested by  Doree Albee, MD.   Past Medical History:  Diagnosis Date  . Atrial fibrillation ([place])   . Chronic diastolic heart failure ([place])   . Hyperglycemia without ketosis 03/17/2015  . Hypertension   . Hypothyroidism (acquired)   . Nephrolithiasis   . Overweight(278.02)   . Right ventricular hypertrophy 12/12/2012  . Type 2 diabetes mellitus ([facility])    Type II  . UTI (urinary tract infection)    Past Surgical History:  Procedure Laterality Date  . GROIN DISSECTION Right 04/20/2013   Procedure: GROIN EXPLORATION;  Surgeon: Donato Heinz, MD;  Location: [facility];  Service: General;  Laterality: Right;  . THYROIDECTOMY     Social History   Socioeconomic History  . Marital status: Divorced    Spouse name: Not on file  . Number of children: 0  . Years of education: Not on file  . Highest education level: Not on file  Occupational History  . Occupation: Employed    Employer: [employer]    Comment: [employer]  Social Needs  . Financial resource strain: Not on file  . Food insecurity:    Worry: Not on file    Inability: Not on file  . Transportation needs:    Medical: Not on file    Non-medical: Not on file  Tobacco Use  . Smoking status: Never Smoker  . Smokeless tobacco: Never Used  Substance and Sexual Activity  . Alcohol use: No  . Drug use: No  . Sexual activity: Never  Lifestyle  . Physical activity:    Days per week: Not on file    Minutes per session: Not on file  . Stress: Not on file  Relationships  . Social connections:    Talks on phone: Not on file    Gets together: Not on file    Attends religious service: Not on file    Active member of club or organization: Not on  file    Attends meetings of clubs or organizations: Not on file    Relationship status: Not on file  Other Topics Concern  . Not on file  Social History Narrative   Full time pt works at [employer]. Widowed. No regular exercise.   Outpatient Encounter Medications as of 06/13/2018  Medication Sig  . blood glucose meter kit and supplies Dispense based on patient and insurance preference. Use up to four times daily as directed. (FOR ICD-9 250.00, 250.01).  . Cholecalciferol (VITAMIN D3) 5000 units CAPS Take 1 capsule (5,000 Units total) by mouth daily.  . furosemide (LASIX) 20 MG tablet Take 20 mg by mouth daily.   Marland Kitchen glucose blood test strip 1 each by Other route 2 (two) times daily. Use as instructed  . Insulin Glargine (LANTUS SOLOSTAR) 100 UNIT/ML Solostar Pen Inject 20 Units into the skin daily at 10 pm.  . levothyroxine (SYNTHROID, LEVOTHROID) 150 MCG tablet Take 150 mcg by mouth daily before breakfast.  . lisinopril (PRINIVIL,ZESTRIL) 5 MG tablet Take 5  mg by mouth daily.  . metoprolol (LOPRESSOR) 50 MG tablet Take 1.5 tablets (75 mg total) by mouth 2 (two) times daily.  Marland Kitchen warfarin (COUMADIN) 3 MG tablet Take 1 1/2 tablets daily or as directed  . [DISCONTINUED] amLODipine (NORVASC) 10 MG tablet Take 10 mg by mouth daily.  . [DISCONTINUED] glucose blood test strip 1 each by Other route 4 (four) times daily. Use as instructed 4 x daily. e11.65  . [DISCONTINUED] JANUVIA 50 MG tablet TAKE 1 TABLET BY MOUTH ONCE DAILY  . [DISCONTINUED] LANTUS SOLOSTAR 100 UNIT/ML Solostar Pen INJECT 50 UNITS SUBCUTANEOUSLY ONCE DAILY AT  10:00  PM   No facility-administered encounter medications on file as of 06/13/2018.     ALLERGIES: Allergies  Allergen Reactions  . Penicillins Rash    VACCINATION STATUS:  There is no immunization history on file for this patient.  Diabetes  She presents for her follow-up diabetic visit. She has type 2 diabetes mellitus. Onset time: She was diagnosed at  approximate age of 27 years. Her disease course has been improving (She was recently hospitalized at which time her A1c was found to be 13%.). There are no hypoglycemic associated symptoms. Pertinent negatives for hypoglycemia include no confusion, headaches, pallor or seizures. Associated symptoms include fatigue and foot paresthesias. Pertinent negatives for diabetes include no chest pain and no polyphagia. There are no hypoglycemic complications. Symptoms are improving. Diabetic complications include nephropathy. Risk factors for coronary artery disease include dyslipidemia, diabetes mellitus, family history, hypertension, obesity and sedentary lifestyle. Current diabetic treatment includes insulin injections. Her weight is increasing steadily. She is following a generally unhealthy diet. When asked about meal planning, she reported none. She has not had a previous visit with a dietitian. She never participates in exercise. Her breakfast blood glucose range is generally 110-130 mg/dl. Her bedtime blood glucose range is generally 130-140 mg/dl. Her overall blood glucose range is 130-140 mg/dl. (She came with significantly improved glycemic profile, near target fasting profile and postprandial targets between 160 and 250 compared to 400-500 range prior to her last visit.  ) An ACE inhibitor/angiotensin II receptor blocker is being taken. She does not see a podiatrist.Eye exam is not current.  Hypertension  This is a chronic problem. The current episode started more than 1 year ago. The problem is controlled. Pertinent negatives include no chest pain, headaches, palpitations or shortness of breath. Risk factors for coronary artery disease include diabetes mellitus, obesity and sedentary lifestyle. Past treatments include ACE inhibitors. Hypertensive end-organ damage includes kidney disease.    Review of Systems  Constitutional: Positive for fatigue. Negative for chills, fever and unexpected weight change.   HENT: Negative for trouble swallowing and voice change.   Eyes: Negative for visual disturbance.  Respiratory: Negative for cough, shortness of breath and wheezing.   Cardiovascular: Positive for leg swelling. Negative for chest pain and palpitations.  Gastrointestinal: Negative for diarrhea, nausea and vomiting.  Endocrine: Negative for cold intolerance, heat intolerance and polyphagia.  Musculoskeletal: Negative for arthralgias and myalgias.  Skin: Negative for color change, pallor, rash and wound.  Neurological: Negative for seizures and headaches.  Psychiatric/Behavioral: Negative for confusion and suicidal ideas.    Objective:    BP (!) 128/57   Pulse 87   Ht 5' (1.524 m)   Wt 201 lb (91.2 kg)   BMI 39.26 kg/m   Wt Readings from Last 3 Encounters:  06/13/18 201 lb (91.2 kg)  02/07/18 184 lb (83.5 kg)  02/07/18 184 lb 9.6 oz (  83.7 kg)     Physical Exam  Constitutional: She is oriented to person, place, and time. She appears well-developed.  HENT:  Head: Normocephalic and atraumatic.  Poor dental condition.  Eyes: EOM are normal.  Neck: Normal range of motion. Neck supple. No tracheal deviation present. No thyromegaly present.  Cardiovascular: Normal rate and regular rhythm.  Pulses:      Dorsalis pedis pulses are 1+ on the right side, and 1+ on the left side.       Posterior tibial pulses are 1+ on the right side, and 1+ on the left side.  She has mild bilateral pretibial edema   Pulmonary/Chest: Effort normal and breath sounds normal.  Abdominal: There is no tenderness. There is no guarding.  Musculoskeletal: Normal range of motion. She exhibits edema.  Neurological: She is alert and oriented to person, place, and time. She has normal reflexes. No cranial nerve deficit. Coordination normal.  Skin: Skin is warm and dry. No rash noted. No erythema. No pallor.  Psychiatric: She has a normal mood and affect. Judgment normal.    CMP ( most recent) CMP     Component  Value Date/Time   NA 142 10/25/2017 1127   K 5.1 10/25/2017 1127   CL 106 10/25/2017 1127   CO2 28 10/25/2017 1127   GLUCOSE 106 (H) 10/25/2017 1127   BUN 31 (A) 05/30/2018   CREATININE 1.6 (A) 05/30/2018   CREATININE 1.35 (H) 10/25/2017 1127   CALCIUM 9.4 10/25/2017 1127   PROT 6.9 10/25/2017 1127   ALBUMIN 3.3 (L) 08/10/2017 1453   AST 23 10/25/2017 1127   ALT 15 10/25/2017 1127   ALKPHOS 154 (H) 08/10/2017 1453   BILITOT 1.0 10/25/2017 1127   GFRNONAA 41 (L) 10/25/2017 1127   GFRAA 47 (L) 10/25/2017 1127    Diabetic Labs (most recent): Lab Results  Component Value Date   HGBA1C 6.4 05/30/2018   HGBA1C 6.6 01/30/2018   HGBA1C 8.2 (H) 10/25/2017     Lipid Panel ( most recent) Lipid Panel     Component Value Date/Time   CHOL 156 10/25/2017 1127   TRIG 49 10/25/2017 1127   HDL 55 10/25/2017 1127   CHOLHDL 2.8 10/25/2017 1127   VLDL 33 07/26/2010 2028   LDLCALC 87 10/25/2017 1127      Lab Results  Component Value Date   TSH 7.46 (A) 01/30/2018   TSH 8.63 (H) 10/25/2017   TSH 0.856 03/17/2015   TSH 28.805 (H) 07/01/2013   TSH 3.470 04/14/2013   FREET4 1.2 10/25/2017     Assessment & Plan:   1. Type 2 diabetes mellitus with stage 3 chronic kidney disease, with long-term current use of insulin ([place])  - Patient has currently uncontrolled symptomatic type 2 DM since  67 years of age. - She came with significantly improved glycemic profile and her A1c improved to 6.4%, generally improving from 13%.   Recent labs reviewed with her.  -her diabetes is complicated by stage 3 renal insufficiency, obesity/sedentary life and Amber Armstrong remains at a high risk for more acute and chronic complications which include CAD, CVA, CKD, retinopathy, and neuropathy. These are all discussed in detail with the patient.  - I have counseled her on diet management and weight loss, by adopting a carbohydrate restricted/protein rich diet.  -  Suggestion is made for her to avoid  simple carbohydrates  from her diet including Cakes, Sweet Desserts / Pastries, Ice Cream, Soda (diet and regular), Sweet Tea, Candies, Chips, Cookies,  Store Bought Juices, Alcohol in Excess of  1-2 drinks a day, Artificial Sweeteners, and "Sugar-free" Products. This will help patient to have stable blood glucose profile and potentially avoid unintended weight gain.   - I encouraged her to switch to  unprocessed or minimally processed complex starch and increased protein intake (animal or plant source), fruits, and vegetables.  - she is advised to stick to a routine mealtimes to eat 3 meals  a day and avoid unnecessary snacks ( to snack only to correct hypoglycemia).   - she has been  scheduled with Jearld Fenton, RDN, CDE for individualized diabetes education- consult in progress.  - I have approached her with the following individualized plan to manage diabetes and patient agrees:   -Based on her presentation with near target glycemic profile both fasting and postprandial, I advised her to continue Lantus 20 units nightly associated with strict monitoring of blood glucose at least 2 times a day-daily before breakfast and at bedtime.     -Patient is encouraged to call clinic for blood glucose levels less than 70 or above 300 mg /dl. - I advised her to continue Januvia 50 mg p.o. daily.  Side effects and precautions discussed with her. -Patient is not a candidate for metformin, SGLT2 inhibitors due to CKD.  - Patient specific target  A1c;  LDL, HDL, Triglycerides, and  Waist Circumference were discussed in detail.  2) BP/HTN: Her blood pressure is marginally controlled.  I advised her to discontinue amlodipine.  She is advised to continue lisinopril 5 mg p.o. daily, metoprolol, and Lasix as needed.    3) Lipids/HPL:   Recent lipid panel showed controlled LDL  of 87 compared to 136 prior to her last visit. Patient is not on statins. She'll be observed without starting for now.    4)   Weight/Diet: CDE Consult is in progress , exercise, and detailed carbohydrates information provided.   5) hypothyroidism: -Her labs show above target TSH, however free T4 is within target. - I advised her to continue on current dose of levothyroxine 150 g by mouth every morning.   - We discussed about correct intake of levothyroxine, at fasting, with water, separated by at least 30 minutes from breakfast, and separated by more than 4 hours from calcium, iron, multivitamins, acid reflux medications (PPIs). -Patient is made aware of the fact that thyroid hormone replacement is needed for life, dose to be adjusted by periodic monitoring of thyroid function tests.  6) Chronic Care/Health Maintenance:  -she  is on ACEI/ARB medications and  is encouraged to continue to follow up with Ophthalmology, Dentist, cardiologist, nephrologist,  podiatrist at least yearly or according to recommendations, and advised to  stay away from smoking. I have recommended yearly flu vaccine and pneumonia vaccination at least every 5 years; moderate intensity exercise for up to 150 minutes weekly; and  sleep for at least 7 hours a day.  - I advised patient to maintain close follow up with Doree Albee, MD for primary care needs.   - Time spent with the patient: 25 min, of which >50% was spent in reviewing her blood glucose logs , discussing her hypo- and hyper-glycemic episodes, reviewing her current and  previous labs and insulin doses and developing a plan to avoid hypo- and hyper-glycemia. Please refer to Patient Instructions for Blood Glucose Monitoring and Insulin/Medications Dosing Guide"  in media tab for additional information. Shireen Quan participated in the discussions, expressed understanding, and voiced agreement with the above  plans.  All questions were answered to her satisfaction. she is encouraged to contact clinic should she have any questions or concerns prior to her return visit.   Follow up  plan: - Return in about 4 months (around 10/13/2018) for Meter, and Logs.   Glade Lloyd, MD [facility] [address] [place], [place] [address] Phone: [phone]  Fax: [phone]    06/13/2018, 2:40 PM  This note was partially dictated with voice recognition software. Similar sounding words can be transcribed inadequately or may not  be corrected upon review.

## 2018-06-13 NOTE — Patient Instructions (Signed)

## 2018-07-02 DIAGNOSIS — E1165 Type 2 diabetes mellitus with hyperglycemia: Secondary | ICD-10-CM | POA: Diagnosis not present

## 2018-07-02 DIAGNOSIS — I482 Chronic atrial fibrillation: Secondary | ICD-10-CM | POA: Diagnosis not present

## 2018-07-02 DIAGNOSIS — I501 Left ventricular failure: Secondary | ICD-10-CM | POA: Diagnosis not present

## 2018-07-02 DIAGNOSIS — I10 Essential (primary) hypertension: Secondary | ICD-10-CM | POA: Diagnosis not present

## 2018-07-03 ENCOUNTER — Other Ambulatory Visit (HOSPITAL_COMMUNITY): Payer: Self-pay | Admitting: Internal Medicine

## 2018-07-03 DIAGNOSIS — I501 Left ventricular failure: Secondary | ICD-10-CM

## 2018-07-05 ENCOUNTER — Ambulatory Visit (HOSPITAL_COMMUNITY)
Admission: RE | Admit: 2018-07-05 | Discharge: 2018-07-05 | Disposition: A | Discharge status: Home / Self Care | Payer: PPO | Source: Ambulatory Visit | Attending: Internal Medicine | Admitting: Internal Medicine

## 2018-07-05 DIAGNOSIS — E119 Type 2 diabetes mellitus without complications: Secondary | ICD-10-CM | POA: Insufficient documentation

## 2018-07-05 DIAGNOSIS — E785 Hyperlipidemia, unspecified: Secondary | ICD-10-CM | POA: Diagnosis not present

## 2018-07-05 DIAGNOSIS — I088 Other rheumatic multiple valve diseases: Secondary | ICD-10-CM | POA: Diagnosis not present

## 2018-07-05 DIAGNOSIS — I313 Pericardial effusion (noninflammatory): Secondary | ICD-10-CM | POA: Insufficient documentation

## 2018-07-05 DIAGNOSIS — Z7901 Long term (current) use of anticoagulants: Secondary | ICD-10-CM | POA: Insufficient documentation

## 2018-07-05 DIAGNOSIS — Z6841 Body Mass Index (BMI) 40.0 and over, adult: Secondary | ICD-10-CM | POA: Insufficient documentation

## 2018-07-05 DIAGNOSIS — I5031 Acute diastolic (congestive) heart failure: Secondary | ICD-10-CM | POA: Insufficient documentation

## 2018-07-05 DIAGNOSIS — I501 Left ventricular failure: Secondary | ICD-10-CM | POA: Diagnosis not present

## 2018-07-05 NOTE — Progress Notes (Signed)
*  PRELIMINARY RESULTS* Echocardiogram 2D Echocardiogram has been performed.  Chandrea, Rawl 07/05/2018, 11:48 AM

## 2018-07-16 DIAGNOSIS — I5022 Chronic systolic (congestive) heart failure: Secondary | ICD-10-CM | POA: Diagnosis not present

## 2018-07-16 DIAGNOSIS — I10 Essential (primary) hypertension: Secondary | ICD-10-CM | POA: Diagnosis not present

## 2018-07-16 DIAGNOSIS — I501 Left ventricular failure: Secondary | ICD-10-CM | POA: Diagnosis not present

## 2018-07-16 DIAGNOSIS — Z7901 Long term (current) use of anticoagulants: Secondary | ICD-10-CM | POA: Diagnosis not present

## 2018-07-16 DIAGNOSIS — E1165 Type 2 diabetes mellitus with hyperglycemia: Secondary | ICD-10-CM | POA: Diagnosis not present

## 2018-07-25 DIAGNOSIS — Z7901 Long term (current) use of anticoagulants: Secondary | ICD-10-CM | POA: Diagnosis not present

## 2018-08-02 DIAGNOSIS — Z7901 Long term (current) use of anticoagulants: Secondary | ICD-10-CM | POA: Diagnosis not present

## 2018-08-07 DIAGNOSIS — Z7901 Long term (current) use of anticoagulants: Secondary | ICD-10-CM | POA: Diagnosis not present

## 2018-08-07 DIAGNOSIS — R21 Rash and other nonspecific skin eruption: Secondary | ICD-10-CM | POA: Diagnosis not present

## 2018-08-14 DIAGNOSIS — W0110XA Fall on same level from slipping, tripping and stumbling with subsequent striking against unspecified object, initial encounter: Secondary | ICD-10-CM | POA: Diagnosis not present

## 2018-08-14 DIAGNOSIS — R931 Abnormal findings on diagnostic imaging of heart and coronary circulation: Secondary | ICD-10-CM | POA: Diagnosis not present

## 2018-08-14 DIAGNOSIS — Z7901 Long term (current) use of anticoagulants: Secondary | ICD-10-CM | POA: Diagnosis not present

## 2018-08-14 DIAGNOSIS — I10 Essential (primary) hypertension: Secondary | ICD-10-CM | POA: Diagnosis not present

## 2018-08-14 DIAGNOSIS — I482 Chronic atrial fibrillation, unspecified: Secondary | ICD-10-CM | POA: Diagnosis not present

## 2018-08-27 DIAGNOSIS — Z7901 Long term (current) use of anticoagulants: Secondary | ICD-10-CM | POA: Diagnosis not present

## 2018-08-27 DIAGNOSIS — I482 Chronic atrial fibrillation, unspecified: Secondary | ICD-10-CM | POA: Diagnosis not present

## 2018-08-29 ENCOUNTER — Emergency Department (HOSPITAL_COMMUNITY): Payer: PPO

## 2018-08-29 ENCOUNTER — Other Ambulatory Visit: Payer: Self-pay

## 2018-08-29 ENCOUNTER — Encounter (HOSPITAL_COMMUNITY): Payer: Self-pay | Admitting: Emergency Medicine

## 2018-08-29 ENCOUNTER — Inpatient Hospital Stay (HOSPITAL_COMMUNITY)
Admission: EM | Admit: 2018-08-29 | Discharge: 2018-09-23 | DRG: 291 | Disposition: E | Payer: PPO | Attending: Internal Medicine | Admitting: Internal Medicine

## 2018-08-29 DIAGNOSIS — E1165 Type 2 diabetes mellitus with hyperglycemia: Secondary | ICD-10-CM | POA: Diagnosis not present

## 2018-08-29 DIAGNOSIS — R531 Weakness: Secondary | ICD-10-CM | POA: Diagnosis not present

## 2018-08-29 DIAGNOSIS — Z515 Encounter for palliative care: Secondary | ICD-10-CM | POA: Diagnosis not present

## 2018-08-29 DIAGNOSIS — Z66 Do not resuscitate: Secondary | ICD-10-CM | POA: Diagnosis present

## 2018-08-29 DIAGNOSIS — J9811 Atelectasis: Secondary | ICD-10-CM | POA: Diagnosis not present

## 2018-08-29 DIAGNOSIS — Z79899 Other long term (current) drug therapy: Secondary | ICD-10-CM

## 2018-08-29 DIAGNOSIS — Z79890 Hormone replacement therapy (postmenopausal): Secondary | ICD-10-CM | POA: Diagnosis not present

## 2018-08-29 DIAGNOSIS — Z6841 Body Mass Index (BMI) 40.0 and over, adult: Secondary | ICD-10-CM

## 2018-08-29 DIAGNOSIS — R0602 Shortness of breath: Secondary | ICD-10-CM | POA: Diagnosis not present

## 2018-08-29 DIAGNOSIS — R0600 Dyspnea, unspecified: Secondary | ICD-10-CM

## 2018-08-29 DIAGNOSIS — Z794 Long term (current) use of insulin: Secondary | ICD-10-CM | POA: Diagnosis not present

## 2018-08-29 DIAGNOSIS — I509 Heart failure, unspecified: Secondary | ICD-10-CM

## 2018-08-29 DIAGNOSIS — E1122 Type 2 diabetes mellitus with diabetic chronic kidney disease: Secondary | ICD-10-CM | POA: Diagnosis not present

## 2018-08-29 DIAGNOSIS — J811 Chronic pulmonary edema: Secondary | ICD-10-CM | POA: Diagnosis not present

## 2018-08-29 DIAGNOSIS — I5041 Acute combined systolic (congestive) and diastolic (congestive) heart failure: Secondary | ICD-10-CM | POA: Diagnosis not present

## 2018-08-29 DIAGNOSIS — E039 Hypothyroidism, unspecified: Secondary | ICD-10-CM | POA: Diagnosis not present

## 2018-08-29 DIAGNOSIS — N189 Chronic kidney disease, unspecified: Secondary | ICD-10-CM

## 2018-08-29 DIAGNOSIS — N179 Acute kidney failure, unspecified: Secondary | ICD-10-CM | POA: Diagnosis not present

## 2018-08-29 DIAGNOSIS — IMO0002 Reserved for concepts with insufficient information to code with codable children: Secondary | ICD-10-CM | POA: Diagnosis present

## 2018-08-29 DIAGNOSIS — I959 Hypotension, unspecified: Secondary | ICD-10-CM | POA: Diagnosis not present

## 2018-08-29 DIAGNOSIS — E785 Hyperlipidemia, unspecified: Secondary | ICD-10-CM | POA: Diagnosis present

## 2018-08-29 DIAGNOSIS — I130 Hypertensive heart and chronic kidney disease with heart failure and stage 1 through stage 4 chronic kidney disease, or unspecified chronic kidney disease: Principal | ICD-10-CM | POA: Diagnosis present

## 2018-08-29 DIAGNOSIS — E11649 Type 2 diabetes mellitus with hypoglycemia without coma: Secondary | ICD-10-CM | POA: Diagnosis not present

## 2018-08-29 DIAGNOSIS — N183 Chronic kidney disease, stage 3 (moderate): Secondary | ICD-10-CM | POA: Diagnosis present

## 2018-08-29 DIAGNOSIS — I5033 Acute on chronic diastolic (congestive) heart failure: Secondary | ICD-10-CM | POA: Diagnosis not present

## 2018-08-29 DIAGNOSIS — I482 Chronic atrial fibrillation, unspecified: Secondary | ICD-10-CM | POA: Diagnosis not present

## 2018-08-29 DIAGNOSIS — I10 Essential (primary) hypertension: Secondary | ICD-10-CM | POA: Diagnosis not present

## 2018-08-29 DIAGNOSIS — I5082 Biventricular heart failure: Secondary | ICD-10-CM | POA: Diagnosis present

## 2018-08-29 DIAGNOSIS — G9341 Metabolic encephalopathy: Secondary | ICD-10-CM | POA: Diagnosis present

## 2018-08-29 DIAGNOSIS — N170 Acute kidney failure with tubular necrosis: Secondary | ICD-10-CM | POA: Diagnosis not present

## 2018-08-29 DIAGNOSIS — Z7901 Long term (current) use of anticoagulants: Secondary | ICD-10-CM | POA: Diagnosis not present

## 2018-08-29 DIAGNOSIS — J9601 Acute respiratory failure with hypoxia: Secondary | ICD-10-CM | POA: Diagnosis not present

## 2018-08-29 DIAGNOSIS — I517 Cardiomegaly: Secondary | ICD-10-CM | POA: Diagnosis present

## 2018-08-29 LAB — CBC
HCT: 50.6 % — ABNORMAL HIGH (ref 36.0–46.0)
Hemoglobin: 14.5 g/dL (ref 12.0–15.0)
MCH: 24 pg — AB (ref 26.0–34.0)
MCHC: 28.7 g/dL — AB (ref 30.0–36.0)
MCV: 83.6 fL (ref 80.0–100.0)
PLATELETS: 240 10*3/uL (ref 150–400)
RBC: 6.05 MIL/uL — ABNORMAL HIGH (ref 3.87–5.11)
RDW: 19.6 % — ABNORMAL HIGH (ref 11.5–15.5)
WBC: 6 10*3/uL (ref 4.0–10.5)
nRBC: 0 % (ref 0.0–0.2)

## 2018-08-29 LAB — BASIC METABOLIC PANEL
Anion gap: 9 (ref 5–15)
BUN: 57 mg/dL — AB (ref 8–23)
CALCIUM: 8.7 mg/dL — AB (ref 8.9–10.3)
CO2: 24 mmol/L (ref 22–32)
Chloride: 107 mmol/L (ref 98–111)
Creatinine, Ser: 2.28 mg/dL — ABNORMAL HIGH (ref 0.44–1.00)
GFR calc Af Amer: 24 mL/min — ABNORMAL LOW (ref >60)
GFR calc non Af Amer: 21 mL/min — ABNORMAL LOW (ref >60)
GLUCOSE: 237 mg/dL — AB (ref 70–99)
Potassium: 4.2 mmol/L (ref 3.5–5.1)
Sodium: 140 mmol/L (ref 135–145)

## 2018-08-29 LAB — URINALYSIS, ROUTINE W REFLEX MICROSCOPIC
BILIRUBIN URINE: NEGATIVE
GLUCOSE, UA: 50 mg/dL — AB
KETONES UR: NEGATIVE mg/dL
Leukocytes, UA: NEGATIVE
Nitrite: NEGATIVE
PH: 5 (ref 5.0–8.0)
Protein, ur: 100 mg/dL — AB
Specific Gravity, Urine: 1.015 (ref 1.005–1.030)

## 2018-08-29 LAB — PROTIME-INR
INR: 1.86
Prothrombin Time: 21.2 seconds — ABNORMAL HIGH (ref 11.4–15.2)

## 2018-08-29 LAB — TROPONIN I
TROPONIN I: 0.04 ng/mL — AB (ref <0.03)
Troponin I: 0.04 ng/mL (ref <0.03)

## 2018-08-29 LAB — BRAIN NATRIURETIC PEPTIDE: B Natriuretic Peptide: 2597 pg/mL — ABNORMAL HIGH (ref 0.0–100.0)

## 2018-08-29 LAB — GLUCOSE, CAPILLARY: Glucose-Capillary: 175 mg/dL — ABNORMAL HIGH (ref 70–99)

## 2018-08-29 LAB — CBG MONITORING, ED: Glucose-Capillary: 237 mg/dL — ABNORMAL HIGH (ref 70–99)

## 2018-08-29 MED ORDER — FUROSEMIDE 10 MG/ML IJ SOLN
40.0000 mg | Freq: Once | INTRAMUSCULAR | Status: AC
  Administered 2018-08-29: 40 mg via INTRAVENOUS
  Filled 2018-08-29: qty 4

## 2018-08-29 MED ORDER — SODIUM CHLORIDE 0.9 % IV BOLUS
500.0000 mL | Freq: Once | INTRAVENOUS | Status: AC
  Administered 2018-08-29: 500 mL via INTRAVENOUS

## 2018-08-29 MED ORDER — ONDANSETRON HCL 4 MG PO TABS: 4.0000 mg | ORAL_TABLET | Freq: Four times a day (QID) | ORAL | Status: DC | PRN

## 2018-08-29 MED ORDER — WARFARIN SODIUM 5 MG PO TABS
5.0000 mg | ORAL_TABLET | Freq: Once | ORAL | Status: AC
  Administered 2018-08-29: 5 mg via ORAL
  Filled 2018-08-29: qty 1

## 2018-08-29 MED ORDER — WARFARIN - PHARMACIST DOSING INPATIENT
Freq: Every day | Status: DC
  Administered 2018-08-30: 1
  Administered 2018-08-31: 18:00:00

## 2018-08-29 MED ORDER — LEVOTHYROXINE SODIUM 75 MCG PO TABS
150.0000 ug | ORAL_TABLET | Freq: Every day | ORAL | Status: DC
  Administered 2018-08-30 – 2018-09-02 (×4): 150 ug via ORAL
  Filled 2018-08-29 (×4): qty 2

## 2018-08-29 MED ORDER — ONDANSETRON HCL 4 MG/2ML IJ SOLN: 4.0000 mg | Freq: Four times a day (QID) | INTRAMUSCULAR | Status: DC | PRN

## 2018-08-29 MED ORDER — POLYETHYLENE GLYCOL 3350 17 G PO PACK: 17.0000 g | PACK | Freq: Every day | ORAL | Status: DC | PRN

## 2018-08-29 MED ORDER — INSULIN GLARGINE 100 UNIT/ML ~~LOC~~ SOLN
10.0000 [IU] | Freq: Every day | SUBCUTANEOUS | Status: DC
  Administered 2018-08-30 – 2018-09-01 (×3): 10 [IU] via SUBCUTANEOUS
  Filled 2018-08-29 (×5): qty 0.1

## 2018-08-29 MED ORDER — FUROSEMIDE 10 MG/ML IJ SOLN
40.0000 mg | Freq: Two times a day (BID) | INTRAMUSCULAR | Status: AC
  Administered 2018-08-30 – 2018-08-31 (×4): 40 mg via INTRAVENOUS
  Filled 2018-08-29 (×4): qty 4

## 2018-08-29 MED ORDER — CHLORHEXIDINE GLUCONATE 0.12 % MT SOLN
15.0000 mL | Freq: Two times a day (BID) | OROMUCOSAL | Status: DC
  Administered 2018-08-29 – 2018-09-01 (×7): 15 mL via OROMUCOSAL
  Filled 2018-08-29 (×7): qty 15

## 2018-08-29 MED ORDER — ORAL CARE MOUTH RINSE
15.0000 mL | Freq: Two times a day (BID) | OROMUCOSAL | Status: DC
  Administered 2018-08-30 – 2018-08-31 (×4): 15 mL via OROMUCOSAL

## 2018-08-29 NOTE — ED Notes (Signed)
Attempted to give report to [phone] -no one answering the phone.

## 2018-08-29 NOTE — ED Triage Notes (Signed)
Pt sent by Home Health Nurse and Dr. Karilyn Cota for for an INR of > 8. Last INR at office was 1.9. Pt on Warfarin. Pt c/o lower extremity weakness and multiple falls over the past few weeks. Pt with multiple bruises on body from falls.

## 2018-08-29 NOTE — H&P (Signed)
History and Physical    Amber Armstrong:[id number] DOB: [birth year] DOA: 09/07/2018  PCP: Doree Albee, MD Patient coming from: Home.  Chief Complaint: "My INR is high"  HPI: Amber Armstrong is a 67 y.o. female with medical history significant for diastolic CHF, IDDM, A. fib on warfarin, hypothyroidism, obesity, hyperlipidemia & hypertension who came to ED out of concern for "elevated INR greater than 8".  She states that her home health nurse came to her house and checked INR which was greater than 8.  She called her PCP who recommended going to the emergency department.  She is on warfarin for atrial fibrillation.  When asked if she have symptoms, she reports problems breathing for days.  She reports feeling short of breath with walking from her bed to door.  She also reports having cold-like symptoms about a week ago.  She reports wheezing when she wakes up in the morning.  She has baseline 4 pillow orthopnea that has not changed.  She denies fever, chills, myalgia, chest pain or edema.  She reports taking her Lasix twice a day.  She denies drinking alcohol or taking over-the-counter pain medication.  She does not watch her salt intake.  In ED, vital signs not impressive.  INR 1.86.  BNP elevated to 2600.  She had markedly elevated proBNP in the past.  Troponin flat at 0.042.  EKG with atrial fibrillation without RVR and right axis deviation (chronic).  Chest x-ray with pulmonary congestion.  BMP with AKI on CKD.  CBC and urinalysis not impressive.  Patient was initially given IV fluid bolus 500 cc.  She became dyspneic and desatted.  Then she was given IV Lasix.  Hospitalist service was called to admit patient for further management. ROS Review of Systems  Constitutional: Negative for fever and weight loss.  HENT: Positive for congestion. Negative for sore throat.   Eyes: Negative for blurred vision.  Respiratory: Positive for shortness of breath. Negative for cough.   Cardiovascular:  Positive for orthopnea. Negative for chest pain, palpitations and leg swelling.       Baseline 4 pillow orthopnea  Gastrointestinal: Negative for abdominal pain, blood in stool, diarrhea, melena and vomiting.  Genitourinary: Negative for dysuria.  Musculoskeletal: Negative for myalgias.  Skin: Negative for rash.  Neurological: Negative for speech change, focal weakness, weakness and headaches.  Endo/Heme/Allergies: Does not bruise/bleed easily.  Psychiatric/Behavioral: Negative for substance abuse. The patient is not nervous/anxious.     PMH Past Medical History:  Diagnosis Date  . Atrial fibrillation ([place])   . Chronic diastolic heart failure ([place])   . Hyperglycemia without ketosis 03/17/2015  . Hypertension   . Hypothyroidism (acquired)   . Nephrolithiasis   . Overweight(278.02)   . Right ventricular hypertrophy 12/12/2012  . Type 2 diabetes mellitus (HCC)    Type II  . UTI (urinary tract infection)    PSH Past Surgical History:  Procedure Laterality Date  . GROIN DISSECTION Right 04/20/2013   Procedure: GROIN EXPLORATION;  Surgeon: Donato Heinz, MD;  Location: [facility];  Service: General;  Laterality: Right;  . THYROIDECTOMY     Fam HX Family History  Problem Relation Age of Onset  . Arrhythmia Mother        Atrial fib    Social Hx  reports that she has never smoked. She has never used smokeless tobacco. She reports that she does not drink alcohol or use drugs.  Allergy Allergies  Allergen Reactions  . Penicillins Rash  Has patient had a PCN reaction causing immediate rash, facial/tongue/throat swelling, SOB or lightheadedness with hypotension: No Has patient had a PCN reaction causing severe rash involving mucus membranes or skin necrosis: Yes Has patient had a PCN reaction that required hospitalization: No Has patient had a PCN reaction occurring within the last 10 years: No If all of the above answers are "NO", then may proceed with Cephalosporin use.     Home Meds Prior to Admission medications   Medication Sig Start Date End Date Taking? Authorizing Provider  furosemide (LASIX) 20 MG tablet Take 20 mg by mouth 2 (two) times daily.    Yes [provider]  levothyroxine (SYNTHROID, LEVOTHROID) 150 MCG tablet Take 150 mcg by mouth daily before breakfast.   Yes [provider]  warfarin (COUMADIN) 3 MG tablet Take 1 1/2 tablets daily or as directed Patient taking differently: Take 3-4.5 mg by mouth See admin instructions. 43m on all days except on Wednesdays and Saturdays. Take 4.575mon Wednesdays and Saturdays only 06/01/16  Yes Branch, JoAlphonse GuildMD  amLODipine (NORVASC) 10 MG tablet Take 10 mg by mouth daily. **    [provider]  blood glucose meter kit and supplies Dispense based on patient and insurance preference. Use up to four times daily as directed. (FOR ICD-9 250.00, 250.01). 08/12/17   KaEber JonesMD  glucose blood test strip 1 each by Other route 2 (two) times daily. Use as instructed    [provider]  lisinopril (PRINIVIL,ZESTRIL) 5 MG tablet Take 5 mg by mouth daily. **    [provider]  metoprolol (LOPRESSOR) 50 MG tablet Take 1.5 tablets (75 mg total) by mouth 2 (two) times daily. Patient not taking: Reported on 08/31/2018 10/14/15   BrArnoldo LenisMD    Physical Exam: Vitals:   08/28/2018 1630 08/31/2018 1700 09/12/2018 1807 09/09/2018 1836  BP: 114/84 135/89 (!) 126/108 (!) 116/93  Pulse: (!) 104 97 (!) 106 93  Resp: (!) '31 18 17 20  ' Temp:    (!) 97.5 F (36.4 C)  TempSrc:    Axillary  SpO2: 92% 95% 92% 100%  Weight:      Height:    5' (1.524 m)    Constitutional: Positional discomfort in bed Eyes: lids and conjunctivae normal HENMT: atraumatic. Normocephalic. Hearing grossly intact. No rhinorrhea.  Wears dentures. Neck: normal. Supple.  Difficult to assess JVD due to body habitus. Resp: normal effort. Good air movement bilaterally.  Crackles to mid scapula  bilaterally. CVS: RRR. S1 & S2 heard. No murmurs.  1-2+ edema in all extremities. GI: normal bowel sound. No tenderness. No distention. MSK: No obvious deformity. No focal tenderness. Moving extremities. Skin: no apparent lesion. Normal warmth.  Neuro: Awake. Alert.  Neuro exam grossly intact. Psych: Calm except for some discomfort with her position in bed.  Labs on Admission: I have personally reviewed following labs and imaging studies  CBC: Recent Labs  Lab 09/01/2018 1250  WBC 6.0  HGB 14.5  HCT 50.6*  MCV 83.6  PLT 24680 Basic Metabolic Panel: Recent Labs  Lab 09/21/2018 1250  NA 140  K 4.2  CL 107  CO2 24  GLUCOSE 237*  BUN 57*  CREATININE 2.28*  CALCIUM 8.7*   GFR: Estimated Creatinine Clearance: 22.7 mL/min (A) (by C-G formula based on SCr of 2.28 mg/dL (H)). Liver Function Tests: No results for input(s): AST, ALT, ALKPHOS, BILITOT, PROT, ALBUMIN in the last 168 hours. No results for  input(s): LIPASE, AMYLASE in the last 168 hours. No results for input(s): AMMONIA in the last 168 hours. Coagulation Profile: Recent Labs  Lab 09/10/2018 1250  INR 1.86   Cardiac Enzymes: Recent Labs  Lab 09/14/2018 1250 09/16/2018 2021  TROPONINI 0.04* 0.04*   BNP (last 3 results) No results for input(s): PROBNP in the last 8760 hours. HbA1C: No results for input(s): HGBA1C in the last 72 hours. CBG: Recent Labs  Lab 09/09/2018 1254  GLUCAP 237*   Lipid Profile: No results for input(s): CHOL, HDL, LDLCALC, TRIG, CHOLHDL, LDLDIRECT in the last 72 hours. Thyroid Function Tests: No results for input(s): TSH, T4TOTAL, FREET4, T3FREE, THYROIDAB in the last 72 hours. Anemia Panel: No results for input(s): VITAMINB12, FOLATE, FERRITIN, TIBC, IRON, RETICCTPCT in the last 72 hours. Urine analysis:    Component Value Date/Time   COLORURINE YELLOW 09/16/2018 1416   APPEARANCEUR CLOUDY (A) 09/22/2018 1416   LABSPEC 1.015 09/14/2018 1416   PHURINE 5.0 09/12/2018 1416   GLUCOSEU  50 (A) 09/22/2018 1416   HGBUR MODERATE (A) 09/17/2018 1416   BILIRUBINUR NEGATIVE 09/11/2018 1416   KETONESUR NEGATIVE 09/09/2018 1416   PROTEINUR 100 (A) 09/04/2018 1416   UROBILINOGEN 0.2 03/17/2015 1159   NITRITE NEGATIVE 09/03/2018 1416   LEUKOCYTESUR NEGATIVE 08/25/2018 1416    Sepsis Labs:  '@LABRCNTIP' (procalcitonin:4,lacticidven:4) )No results found for this or any previous visit (from the past 240 hour(s)).   Radiological Exams on Admission: Dg Chest 2 View  Result Date: 09/16/2018 CLINICAL DATA:  67 year old female with lower extremity weakness, cough and shortness of breath. Multiple falls. EXAM: CHEST - 2 VIEW COMPARISON:  Portable chest 08/10/2017 and earlier. FINDINGS: Seated AP and lateral views of the chest. Moderate to severe cardiomegaly. Stable mediastinal contours. Indistinct pulmonary vasculature, increased at the lung bases. Patchy bibasilar opacity. No definite pleural effusion, consolidation. Visualized tracheal air column is within normal limits. Osteopenia. No acute osseous abnormality identified. Negative visible bowel gas pattern. IMPRESSION: Mild or developing pulmonary edema suspected along with basilar atelectasis. Moderate to severe cardiomegaly. Electronically Signed   By: Genevie Ann M.D.   On: 08/31/2018 16:20    All images have been reviewed by me personally.   EKG: Independently reviewed.  A. fib without RVR.  Right axis deviation.  No acute ischemic finding.  Assessment/Plan Principal Problem:   CHF exacerbation (HCC) Active Problems:   Uncontrolled type 2 diabetes mellitus with stage 3 chronic kidney disease (HCC)   Morbid obesity with BMI of 40.0-44.9, adult (HCC)   Essential hypertension, benign   Atrial fibrillation, chronic   Right ventricular hypertrophy   Acute kidney injury ([place])   Hypothyroidism    Acute on chronic diastolic CHF exacerbation: Appears fluid overloaded.  Chest x-ray with pulmonary congestion.  BNP elevated but no baseline  in nature.  She had markedly elevated proBNP in the past.  Troponin flat at 0.04 likely demand -Continue IV Lasix 40 mg twice daily -Daily weight, intake output and renal function -Resume GDMT as able. -Hold lisinopril in the setting of AKI. -Continue monitoring troponin -Update echo  Acute on chronic renal failure: Creatinine elevated to 2.28.  Baseline 1.2-1.3.  Could be cardiorenal.  She is also on ACE inhibitor's and Lasix. -We will continue Lasix in the setting of CHF exacerbation. -Closely monitor renal functions. -Can consider FEurea if no improvement.  Type 2 diabetes: On Lantus at home -Resume Lantus at half dose -SSI -Monitor CBG AC at bedtime  Atrial fibrillation:  Not in RVR.  [place] vascular  score 4.5.  INR subtherapeutic -Continue warfarin per pharmacy -Monitor INR  Hypertension: Currently normotensive. -Continue beta-blocker Lasix as above  Hypothyroidism -Continue home Synthroid  DVT prophylaxis: On warfarin for atrial fibrillation Code Status: DNR Family Communication: No family member at bedside Disposition Plan: Admit to telemetry Consults called: None Admission status: Inpatient status.  Patient with significant fluid overload, NYHA 3-4 HF,  and edema complicated by AKI, and significant comorbidities and poor living condition.  Anticipate discharge to SNF   Mercy Riding MD [facility] Pager [phone]  If 7PM-7AM, please contact night-coverage [email or web address] Password [facility]  09/18/2018, 9:39 PM

## 2018-08-29 NOTE — Progress Notes (Signed)
ANTICOAGULATION CONSULT NOTE - Initial Consult  Pharmacy Consult for warfarin Indication: atrial fibrillation  Allergies  Allergen Reactions  . Penicillins Rash    Patient Measurements: Height: 5' (152.4 cm) Weight: 185 lb (83.9 kg) IBW/kg (Calculated) : 45.5   Vital Signs: Temp: 97.4 F (36.3 C) (11/06 1250) Temp Source: Oral (11/06 1250) BP: 135/89 (11/06 1700) Pulse Rate: 97 (11/06 1700)  Labs: Recent Labs    09/01/2018 1250  HGB 14.5  HCT 50.6*  PLT 240  LABPROT 21.2*  INR 1.86  CREATININE 2.28*  TROPONINI 0.04*    Estimated Creatinine Clearance: 22.7 mL/min (A) (by C-G formula based on SCr of 2.28 mg/dL (H)).   Medical History: Past Medical History:  Diagnosis Date  . Atrial fibrillation (HCC)   . Chronic diastolic heart failure (HCC)   . Hyperglycemia without ketosis 03/17/2015  . Hypertension   . Hypothyroidism (acquired)   . Nephrolithiasis   . Overweight(278.02)   . Right ventricular hypertrophy 12/12/2012  . Type 2 diabetes mellitus (HCC)    Type II  . UTI (urinary tract infection)     Medications:   (Not in a hospital admission)  Assessment: Pharmacy consulted to dose warfarin for patient with atrial fibrillation.  Patient's home dose is listed as 4.5 mg on Wed and Sat and 3 mg ROW. INR on admission is 1.86.  Goal of Therapy:  INR 2-3 Monitor platelets by anticoagulation protocol: Yes   Plan:  Warfarin 5 mg x 1 dose. Monitor daily INR and s/s of bleeding  Tad Moore 08/24/2018,5:26 PM

## 2018-08-29 NOTE — ED Notes (Signed)
Date and time results received: 08/24/2018 1618  Test: Troponin Critical Value: 0.04  Name of Provider Notified: Dr. Rhunette Croft  Orders Received? Or Actions Taken?: See chart

## 2018-08-29 NOTE — ED Provider Notes (Addendum)
Heart failure, diabetes, hypertension and morbid obesity comes in with chief complaint of abnormal labs.   [facility] EMERGENCY DEPARTMENT Provider Note   CSN: [id number] Arrival date & time: 08/24/2018  1240     History   Chief Complaint Chief Complaint  Patient presents with  . Abnormal Lab    HPI Amber Armstrong is a 67 y.o. female.  HPI  67 year old female with history of A. fib on Coumadin, chronic diastolic heart failure, type 2 diabetes, hypertension and morbid obesity comes in with chief complaint of abnormal labs.  Patient states that her INR results came back greater than 8, therefore she was advised to come to the ER for further evaluation.  She reports chronically bruising easily, but denies any active bleeding.  Patient denies any new medication or changes to her home medications.  Patient has no other complaints from her side.  She states that she would not be here if it was not for her physician asking her to come to the ED.  Past Medical History:  Diagnosis Date  . Atrial fibrillation ([place])   . Chronic diastolic heart failure ([place])   . Hyperglycemia without ketosis 03/17/2015  . Hypertension   . Hypothyroidism (acquired)   . Nephrolithiasis   . Overweight(278.02)   . Right ventricular hypertrophy 12/12/2012  . Type 2 diabetes mellitus (HCC)    Type II  . UTI (urinary tract infection)     Patient Active Problem List   Diagnosis Date Noted  . Uncontrolled type 2 diabetes mellitus with hyperglycemia ([place]) 11/08/2017  . Vitamin D deficiency 11/08/2017  . Hypothyroidism 08/21/2017  . Hyperglycemia 08/10/2017  . Acute kidney injury ([place]) 03/18/2015  . UTI (lower urinary tract infection) 03/17/2015  . Hyponatremia 03/17/2015  . Hyperglycemia without ketosis 03/17/2015  . Dehydration 03/17/2015  . Hyperglycemia due to type 2 diabetes mellitus ([place])   . Wound infection after surgery 07/01/2013  . Chronic diastolic heart failure ([place]) 07/01/2013  .  Cellulitis of right groin 07/01/2013  . Hematoma of groin 04/20/2013  . Pseudoaneurysm ([place]) 04/20/2013  . Acute blood loss anemia 04/20/2013  . Hemorrhagic shock ([place]) 04/20/2013  . Acute diastolic heart failure ([place]) 04/15/2013  . Acute respiratory failure ([place]) 04/14/2013  . Right ventricular hypertrophy 12/12/2012  . Bilateral lower extremity edema 03/28/2012  . Long term current use of anticoagulant 02/01/2011  . HYPERLIPIDEMIA 06/14/2010  . HYPOKALEMIA, MILD 04/02/2010  . Uncontrolled type 2 diabetes mellitus with stage 3 chronic kidney disease ([place]) 05/05/2009  . Morbid obesity with BMI of 40.0-44.9, adult ([place]) 05/05/2009  . Essential hypertension, benign 05/05/2009  . NEPHROLITHIASIS 05/05/2009  . UTI 05/05/2009  . THYROIDECTOMY, HX OF 05/05/2009    Past Surgical History:  Procedure Laterality Date  . GROIN DISSECTION Right 04/20/2013   Procedure: GROIN EXPLORATION;  Surgeon: Donato Heinz, MD;  Location: [facility];  Service: General;  Laterality: Right;  . THYROIDECTOMY       OB History   None      Home Medications    Prior to Admission medications   Medication Sig Start Date End Date Taking? Authorizing Provider  blood glucose meter kit and supplies Dispense based on patient and insurance preference. Use up to four times daily as directed. (FOR ICD-9 250.00, 250.01). 08/12/17   Eber Jones, MD  Cholecalciferol (VITAMIN D3) 5000 units CAPS Take 1 capsule (5,000 Units total) by mouth daily. 11/08/17   Cassandria Anger, MD  furosemide (LASIX) 20 MG tablet Take 20  mg by mouth daily.     [provider]  glucose blood test strip 1 each by Other route 2 (two) times daily. Use as instructed    [provider]  Insulin Glargine (LANTUS SOLOSTAR) 100 UNIT/ML Solostar Pen Inject 20 Units into the skin daily at 10 pm. 02/07/18   Nida, Marella Chimes, MD  levothyroxine (SYNTHROID, LEVOTHROID) 150 MCG tablet Take 150 mcg by mouth daily before  breakfast.    [provider]  lisinopril (PRINIVIL,ZESTRIL) 5 MG tablet Take 5 mg by mouth daily.    [provider]  metoprolol (LOPRESSOR) 50 MG tablet Take 1.5 tablets (75 mg total) by mouth 2 (two) times daily. 10/14/15   Arnoldo Lenis, MD  warfarin (COUMADIN) 3 MG tablet Take 1 1/2 tablets daily or as directed 06/01/16   Arnoldo Lenis, MD    Family History Family History  Problem Relation Age of Onset  . Arrhythmia Mother        Atrial fib    Social History Social History   Tobacco Use  . Smoking status: Never Smoker  . Smokeless tobacco: Never Used  Substance Use Topics  . Alcohol use: No  . Drug use: No     Allergies   Penicillins   Review of Systems Review of Systems  Constitutional: Positive for activity change and fatigue.  Gastrointestinal: Negative for blood in stool.  Genitourinary: Negative for dysuria and hematuria.  Neurological: Positive for weakness.  Hematological: Bruises/bleeds easily.  All other systems reviewed and are negative.    Physical Exam Updated Vital Signs BP 114/84   Pulse (!) 104   Temp (!) 97.4 F (36.3 C) (Oral)   Resp (!) 31   Ht 5' (1.524 m)   Wt 83.9 kg   SpO2 92%   BMI 36.13 kg/m   Physical Exam  Constitutional: She is oriented to person, place, and time. She appears well-developed.  HENT:  Head: Normocephalic and atraumatic.  Eyes: EOM are normal.  Neck: Normal range of motion. Neck supple.  Cardiovascular: Normal rate.  Pulmonary/Chest: Effort normal. She has no wheezes. She has no rales.  Poor aeration  Abdominal: Bowel sounds are normal.  Musculoskeletal: She exhibits no edema.  Neurological: She is alert and oriented to person, place, and time.  Skin: Skin is warm and dry.  Nursing note and vitals reviewed.    ED Treatments / Results  Labs (all labs ordered are listed, but only abnormal results are displayed) Labs Reviewed  BASIC METABOLIC PANEL - Abnormal; Notable for the  following components:      Result Value   Glucose, Bld 237 (*)    BUN 57 (*)    Creatinine, Ser 2.28 (*)    Calcium 8.7 (*)    GFR calc non Af Amer 21 (*)    GFR calc Af Amer 24 (*)    All other components within normal limits  CBC - Abnormal; Notable for the following components:   RBC 6.05 (*)    HCT 50.6 (*)    MCH 24.0 (*)    MCHC 28.7 (*)    RDW 19.6 (*)    All other components within normal limits  URINALYSIS, ROUTINE W REFLEX MICROSCOPIC - Abnormal; Notable for the following components:   APPearance CLOUDY (*)    Glucose, UA 50 (*)    Hgb urine dipstick MODERATE (*)    Protein, ur 100 (*)    Bacteria, UA RARE (*)    All other components  within normal limits  PROTIME-INR - Abnormal; Notable for the following components:   Prothrombin Time 21.2 (*)    All other components within normal limits  TROPONIN I - Abnormal; Notable for the following components:   Troponin I 0.04 (*)    All other components within normal limits  BRAIN NATRIURETIC PEPTIDE - Abnormal; Notable for the following components:   B Natriuretic Peptide 2,597.0 (*)    All other components within normal limits  CBG MONITORING, ED - Abnormal; Notable for the following components:   Glucose-Capillary 237 (*)    All other components within normal limits    EKG EKG Interpretation  Date/Time:  Wednesday August 29 2018 12:50:48 EST Ventricular Rate:  97 PR Interval:    QRS Duration: 86 QT Interval:  454 QTC Calculation: 577 R Axis:   167 Text Interpretation:  Atrial fibrillation Low voltage, extremity and precordial leads Probable right ventricular hypertrophy Abnormal lateral Q waves Prolonged QT interval No acute changes Confirmed by Varney Biles ([id number]) on 09/19/2018 1:32:35 PM   Radiology Dg Chest 2 View  Result Date: 08/27/2018 CLINICAL DATA:  67 year old female with lower extremity weakness, cough and shortness of breath. Multiple falls. EXAM: CHEST - 2 VIEW COMPARISON:  Portable chest  08/10/2017 and earlier. FINDINGS: Seated AP and lateral views of the chest. Moderate to severe cardiomegaly. Stable mediastinal contours. Indistinct pulmonary vasculature, increased at the lung bases. Patchy bibasilar opacity. No definite pleural effusion, consolidation. Visualized tracheal air column is within normal limits. Osteopenia. No acute osseous abnormality identified. Negative visible bowel gas pattern. IMPRESSION: Mild or developing pulmonary edema suspected along with basilar atelectasis. Moderate to severe cardiomegaly. Electronically Signed   By: Genevie Ann M.D.   On: 08/27/2018 16:20    Procedures .Critical Care Performed by: Varney Biles, MD Authorized by: Varney Biles, MD   Critical care provider statement:    Critical care time (minutes):  45   Critical care start time:  09/04/2018 12:30 PM   Critical care end time:  08/24/2018 4:33 PM   Critical care was necessary to treat or prevent imminent or life-threatening deterioration of the following conditions:  Respiratory failure, circulatory failure and cardiac failure   Critical care was time spent personally by me on the following activities:  Discussions with consultants, evaluation of patient's response to treatment, examination of patient, ordering and performing treatments and interventions, ordering and review of laboratory studies, ordering and review of radiographic studies, pulse oximetry, re-evaluation of patient's condition, obtaining history from patient or surrogate and review of old charts   (including critical care time)   Medications Ordered in ED Medications  sodium chloride 0.9 % bolus 500 mL (0 mLs Intravenous Stopped 09/07/2018 1530)  furosemide (LASIX) injection 40 mg (40 mg Intravenous Given 09/06/2018 1640)     Initial Impression / Assessment and Plan / ED Course  I have reviewed the triage vital signs and the nursing notes.  Pertinent labs & imaging results that were available during my care of the  patient were reviewed by me and considered in my medical decision making (see chart for details).  Clinical Course as of Aug 30 1655  Wed Aug 29, 2018  1330 INR is actually not elevated.  Patient's creatinine is slightly bound.  She reports that she has been feeling weak and not eating as well.  Her BUN/creatinine ratio is greater than 20, I suspect that there could be mild dehydration.  We will start oral challenge and give her small IV fluid bolus.  I might have to speak with patient's PCP about prompt follow-up  INR: 1.86 [AN]  1530 Patient noted to have desats. Upon further speaking with the patient, she reports now that she has been having some wheezing at nighttime when she is supine. On exam she does not have clear evidence of rales. She also informs me now that her PCP had advised her to stop taking her Lasix few days ago because it appeared that the Lasix was dehydrating her.  I suspect that patient is having some volume overload.  She had received 500 cc bolus because of her elevated BUN/creatinine ratio -but perhaps she is not dry but volume overloaded.  We will do a chest x-ray.   [AN]  1627 Patient now comfortable while sitting up. X-ray shows pulmonary edema.  We will give her IV Lasix 40 mg and pursue admission for acute on chronic renal failure and acute CHF exacerbation.   [AN]    Clinical Course User Index [AN] Varney Biles, MD    67 year old female with history of A. fib and diastolic CHF comes in with chief complaint of elevated INR.  She is on Coumadin because of her A. Fib.  She denies any new medications or changes to her current medications.  No recent antibiotics.  Her INR is normally well controlled.  Fortunately, there does not appear to be any bleeding.  We will get her labs and assess her renal function.  4:56 PM Hospitalist team will admit the patient. They asked me if there was any concerns for PE because the EKG is showing RVH and the INR is  subtherapeutic. I informed him that given that patient has stopped taking the Lasix over the past few days with orthopnea, paroxysmal nocturnal dyspnea and chest x-ray showing pulmonary edema we think that the intermittent episodes of hypoxia are because of acute CHF exacerbation and that she does not merit a PE work-up unless she is not responding to diuresing.  Patient has CKD she is morbidly obese and not a prime candidate for even a VQ scan, and my pretest probability for PE is already low.  Hospitalist team agrees to the plan with this discussion and I appreciate them admitting the patient.  Final Clinical Impressions(s) / ED Diagnoses   Final diagnoses:  Acute combined systolic and diastolic congestive heart failure (HCC)  Acute renal failure superimposed on chronic kidney disease, unspecified CKD stage, unspecified acute renal failure type ([place])  Acute respiratory failure with hypoxia [facility])    ED Discharge Orders    None       Varney Biles, MD 09/14/2018 1634    Varney Biles, MD 09/14/2018 1658

## 2018-08-29 NOTE — ED Notes (Signed)
Updated dr. Rhunette Croft on increased oxygen needs. He agrees we do not need to ambulate.

## 2018-08-30 ENCOUNTER — Inpatient Hospital Stay (HOSPITAL_COMMUNITY): Payer: PPO

## 2018-08-30 DIAGNOSIS — Z6841 Body Mass Index (BMI) 40.0 and over, adult: Secondary | ICD-10-CM

## 2018-08-30 DIAGNOSIS — N183 Chronic kidney disease, stage 3 unspecified: Secondary | ICD-10-CM

## 2018-08-30 DIAGNOSIS — I482 Chronic atrial fibrillation, unspecified: Secondary | ICD-10-CM

## 2018-08-30 DIAGNOSIS — I5033 Acute on chronic diastolic (congestive) heart failure: Secondary | ICD-10-CM

## 2018-08-30 DIAGNOSIS — J9601 Acute respiratory failure with hypoxia: Secondary | ICD-10-CM

## 2018-08-30 DIAGNOSIS — N179 Acute kidney failure, unspecified: Secondary | ICD-10-CM

## 2018-08-30 DIAGNOSIS — I10 Essential (primary) hypertension: Secondary | ICD-10-CM

## 2018-08-30 DIAGNOSIS — R0602 Shortness of breath: Secondary | ICD-10-CM

## 2018-08-30 LAB — BASIC METABOLIC PANEL
Anion gap: 9 (ref 5–15)
BUN: 57 mg/dL — ABNORMAL HIGH (ref 8–23)
CALCIUM: 8.3 mg/dL — AB (ref 8.9–10.3)
CO2: 19 mmol/L — AB (ref 22–32)
CREATININE: 2.32 mg/dL — AB (ref 0.44–1.00)
Chloride: 108 mmol/L (ref 98–111)
GFR, EST AFRICAN AMERICAN: 24 mL/min — AB (ref >60)
GFR, EST NON AFRICAN AMERICAN: 20 mL/min — AB (ref >60)
GLUCOSE: 166 mg/dL — AB (ref 70–99)
Potassium: 4.3 mmol/L (ref 3.5–5.1)
Sodium: 136 mmol/L (ref 135–145)

## 2018-08-30 LAB — CBC
HCT: 46.9 % — ABNORMAL HIGH (ref 36.0–46.0)
Hemoglobin: 13 g/dL (ref 12.0–15.0)
MCH: 23.4 pg — AB (ref 26.0–34.0)
MCHC: 27.7 g/dL — ABNORMAL LOW (ref 30.0–36.0)
MCV: 84.5 fL (ref 80.0–100.0)
PLATELETS: 217 10*3/uL (ref 150–400)
RBC: 5.55 MIL/uL — AB (ref 3.87–5.11)
RDW: 19.6 % — ABNORMAL HIGH (ref 11.5–15.5)
WBC: 5.8 10*3/uL (ref 4.0–10.5)
nRBC: 0 % (ref 0.0–0.2)

## 2018-08-30 LAB — GLUCOSE, CAPILLARY
GLUCOSE-CAPILLARY: 178 mg/dL — AB (ref 70–99)
GLUCOSE-CAPILLARY: 125 mg/dL — AB (ref 70–99)
GLUCOSE-CAPILLARY: 155 mg/dL — AB (ref 70–99)

## 2018-08-30 LAB — ECHOCARDIOGRAM COMPLETE
Height: 60 in
Weight: 3342.4 oz

## 2018-08-30 LAB — PROTIME-INR
INR: 1.98
PROTHROMBIN TIME: 22.2 s — AB (ref 11.4–15.2)

## 2018-08-30 LAB — TROPONIN I
TROPONIN I: 0.05 ng/mL — AB (ref <0.03)
Troponin I: 0.05 ng/mL (ref <0.03)

## 2018-08-30 MED ORDER — LIVING BETTER WITH HEART FAILURE BOOK: Freq: Once | Status: DC

## 2018-08-30 MED ORDER — INSULIN ASPART 100 UNIT/ML ~~LOC~~ SOLN
0.0000 [IU] | Freq: Three times a day (TID) | SUBCUTANEOUS | Status: DC
  Administered 2018-08-30: 1 [IU] via SUBCUTANEOUS
  Administered 2018-08-30: 2 [IU] via SUBCUTANEOUS
  Administered 2018-08-31 (×2): 1 [IU] via SUBCUTANEOUS

## 2018-08-30 MED ORDER — WARFARIN SODIUM 2 MG PO TABS
3.0000 mg | ORAL_TABLET | Freq: Once | ORAL | Status: AC
  Administered 2018-08-30: 3 mg via ORAL
  Filled 2018-08-30: qty 1

## 2018-08-30 MED ORDER — INSULIN ASPART 100 UNIT/ML ~~LOC~~ SOLN: 0.0000 [IU] | Freq: Every day | SUBCUTANEOUS | Status: DC

## 2018-08-30 NOTE — NC FL2 (Signed)
[place] MEDICAID FL2 LEVEL OF CARE SCREENING TOOL     IDENTIFICATION  Patient Name: Amber Armstrong Birthdate: December 18, 1950 Sex: female Admission Date (Current Location): 09/06/2018  [facility] and [place] Number:  [employer] and Address:  [facility],  [address] [place] [address]      Provider Number: [id number]  Attending Physician Name and Address:  Catarina Hartshorn, MD  Relative Name and Phone Number:       Current Level of Care: Hospital Recommended Level of Care: Skilled Nursing Facility Prior Approval Number:    Date Approved/Denied:   PASRR Number: [id number]  Discharge Plan: SNF    Current Diagnoses: Patient Active Problem List   Diagnosis Date Noted  . Acute respiratory failure with hypoxia (HCC) 08/30/2018  . Acute on chronic diastolic CHF (congestive heart failure) (HCC) 08/30/2018  . Acute renal failure superimposed on stage 3 chronic kidney disease (HCC) 08/30/2018  . CHF exacerbation (HCC) 2018/09/06  . Uncontrolled type 2 diabetes mellitus with hyperglycemia (HCC) 11/08/2017  . Vitamin D deficiency 11/08/2017  . Hypothyroidism 08/21/2017  . Hyperglycemia 08/10/2017  . Acute kidney injury (HCC) 03/18/2015  . UTI (lower urinary tract infection) 03/17/2015  . Hyponatremia 03/17/2015  . Hyperglycemia without ketosis 03/17/2015  . Dehydration 03/17/2015  . Hyperglycemia due to type 2 diabetes mellitus (HCC)   . Wound infection after surgery 07/01/2013  . Chronic diastolic heart failure (HCC) 07/01/2013  . Cellulitis of right groin 07/01/2013  . Hematoma of groin 04/20/2013  . Pseudoaneurysm (HCC) 04/20/2013  . Acute blood loss anemia 04/20/2013  . Hemorrhagic shock (HCC) 04/20/2013  . Acute diastolic heart failure (HCC) 04/15/2013  . Acute respiratory failure (HCC) 04/14/2013  . Right ventricular hypertrophy 12/12/2012  . Bilateral lower extremity edema 03/28/2012  . Long term current use of anticoagulant 02/01/2011  .  HYPERLIPIDEMIA 06/14/2010  . HYPOKALEMIA, MILD 04/02/2010  . Uncontrolled type 2 diabetes mellitus with stage 3 chronic kidney disease (HCC) 05/05/2009  . Morbid obesity with BMI of 40.0-44.9, adult (HCC) 05/05/2009  . Essential hypertension, benign 05/05/2009  . Atrial fibrillation, chronic 05/05/2009  . NEPHROLITHIASIS 05/05/2009  . UTI 05/05/2009  . THYROIDECTOMY, HX OF 05/05/2009    Orientation RESPIRATION BLADDER Height & Weight     Self, Time, Situation, Place  O2(nasal cannula) Continent Weight: 94.8 kg Height:  5' (152.4 cm)  BEHAVIORAL SYMPTOMS/MOOD NEUROLOGICAL BOWEL NUTRITION STATUS  (none) (None) Continent Diet(heart healthy, carb modified)  AMBULATORY STATUS COMMUNICATION OF NEEDS Skin   Extensive Assist Verbally Other (Comment)(bilateral scabs)                       Personal Care Assistance Level of Assistance  Bathing, Feeding, Dressing Bathing Assistance: Limited assistance Feeding assistance: Independent Dressing Assistance: Limited assistance     Functional Limitations Info  Sight, Hearing, Speech Sight Info: Adequate Hearing Info: Adequate Speech Info: Adequate    SPECIAL CARE FACTORS FREQUENCY  PT (By licensed PT)     PT Frequency: 5x/w              Contractures Contractures Info: Not present    Additional Factors Info  Code Status, Allergies Code Status Info: DNR Allergies Info: Penicillins           Current Medications (08/30/2018):  This is the current hospital active medication list Current Facility-Administered Medications  Medication Dose Route Frequency Provider Last Rate Last Dose  . chlorhexidine (PERIDEX) 0.12 % solution 15 mL  15 mL Mouth  Rinse BID Candelaria Stagers T, MD   15 mL at 08/30/18 0954  . furosemide (LASIX) injection 40 mg  40 mg Intravenous Q12H Candelaria Stagers T, MD   40 mg at 08/30/18 0622  . insulin aspart (novoLOG) injection 0-5 Units  0-5 Units Subcutaneous QHS Tat, David, MD      . insulin aspart (novoLOG)  injection 0-9 Units  0-9 Units Subcutaneous TID WC Tat, David, MD      . insulin glargine (LANTUS) injection 10 Units  10 Units Subcutaneous [id number] Gonfa, Taye T, MD      . levothyroxine (SYNTHROID, LEVOTHROID) tablet 150 mcg  150 mcg Oral [id number] Candelaria Stagers T, MD   150 mcg at 08/30/18 0622  . Living Better with Heart Failure Book   Does not apply Once Almon Hercules, MD   Stopped at 08/30/18 0622  . MEDLINE mouth rinse  15 mL Mouth Rinse q12n4p Gonfa, Taye T, MD      . ondansetron (ZOFRAN) tablet 4 mg  4 mg Oral Q6H PRN Gonfa, Taye T, MD       Or  . ondansetron (ZOFRAN) injection 4 mg  4 mg Intravenous Q6H PRN Gonfa, Taye T, MD      . polyethylene glycol (MIRALAX / GLYCOLAX) packet 17 g  17 g Oral Daily PRN Gonfa, Taye T, MD      . warfarin (COUMADIN) tablet 3 mg  3 mg Oral Once Tat, Onalee Hua, MD      . Warfarin - Pharmacist Dosing Inpatient   Does not apply [id number] Almon Hercules, MD         Discharge Medications: Please see discharge summary for a list of discharge medications.  Relevant Imaging Results:  Relevant Lab Results:   Additional Information [phone] [place], [place]

## 2018-08-30 NOTE — Progress Notes (Signed)
Notified dr Robb Matar of elevating trop of 0.05. No s/s of acute distress noted. Pt resting in bed with eyes closed.

## 2018-08-30 NOTE — Progress Notes (Signed)
PROGRESS NOTE  Amber Armstrong NUU:[id number] DOB: 1951/02/23 DOA: 08/25/2018 PCP: Wilson Singer, MD  Brief History:  67 year old female with a history of diastolic CHF, diabetes mellitus type 2, chronic atrial fibrillation on warfarin, hypothyroidism, hypertension, hyperlipidemia presenting with 2 to 3-week history of shortness of breath and orthopnea.  The patient states that she was instructed to decrease her furosemide from 20 mg twice daily to 20 mg daily approximately 1 month prior to this admission secondary to worsening renal function.  She endorses compliance with all her medications, but states that she often does not follow a cardiac type diet.  She denies any fevers, chills, chest pain, nausea, vomiting, diarrhea, abdominal pain, dysuria, hematuria, dizziness, headache, neck pain.  Upon presentation, the patient was noted to have oxygen saturation in the 80s.  She was afebrile hemodynamically stable.  BNP was 2597.  Chest x-ray showed pulmonary edema.  Patient was started on IV furosemide.  Assessment/Plan: Acute on chronic diastolic CHF -Primarily right heart failure -07/05/2018 echo EF 60 to 65%, no WMA, moderate TR, PASP 79, moderate pericardial effusion -Continue furosemide IV 40 mg twice daily -Daily weights -Accurate I's and O's  Acute respiratory failure with hypoxia -Secondary to pulmonary edema -Presently stable on 2 L -Wean oxygen back to room as tolerated  Acute on chronic renal failure--CKD stage III -Previous renal baseline 1.3-1.6 -The patient likely has progression of her underlying renal disease -Renal ultrasound -Holding lisinopril  Diabetes mellitus type 2, controlled -05/30/2018 hemoglobin A 1 C6 0.4 -Continue reduced dose Lantus -NovoLog sliding scale  Chronic atrial fibrillation -Rate controlled -Continue metoprolol tartrate -Continue warfarin  Hypothyroidism -Continue Synthroid  Morbid obesity -BMI 40.80 -Lifestyle  modification    Disposition Plan:   SNF in 2-3 days  Family Communication:   Cousin updated at bedside 11/7  Consultants:  none  Code Status:  FULL   DVT Prophylaxis:  warfarin   Procedures: As Listed in Progress Note Above  Antibiotics: None  RN Pressure Injury Documentation:        Subjective: Patient continues to complain of some dyspnea but it is improving.  She continues to have orthopnea.  She denies any fevers, chills, headache, neck pain, chest pain, nausea, vomiting, diarrhea, abdominal pain.  Objective: Vitals:   08/24/2018 1807 08/28/2018 1836 08/30/18 0500 08/30/18 0620  BP: (!) 126/108 (!) 116/93  106/82  Pulse: (!) 106 93  94  Resp: 17 20  18   Temp:  (!) 97.5 F (36.4 C)  97.9 F (36.6 C)  TempSrc:  Axillary    SpO2: 92% 100%  90%  Weight:   94.8 kg   Height:  5' (1.524 m)      Intake/Output Summary (Last 24 hours) at 08/30/2018 1104 Last data filed at 08/30/2018 0900 Gross per 24 hour  Intake 714.22 ml  Output -  Net 714.22 ml   Weight change:  Exam:   General:  Pt is alert, follows commands appropriately, not in acute distress  HEENT: No icterus, No thrush, No neck mass, [place]/AT  Cardiovascular: RRR, S1/S2, no rubs, no gallops; positive JVD  Respiratory: Bibasilar crackles.  No wheezing.  Abdomen: Soft/+BS, non tender, non distended, no guarding  Extremities: 2+ lower extremity edema, No lymphangitis, No petechiae, No rashes, no synovitis   Data Reviewed: I have personally reviewed following labs and imaging studies Basic Metabolic Panel: Recent Labs  Lab 09/14/2018 1250 08/30/18 0154  NA 140 136  K 4.2  4.3  CL 107 108  CO2 24 19*  GLUCOSE 237* 166*  BUN 57* 57*  CREATININE 2.28* 2.32*  CALCIUM 8.7* 8.3*   Liver Function Tests: No results for input(s): AST, ALT, ALKPHOS, BILITOT, PROT, ALBUMIN in the last 168 hours. No results for input(s): LIPASE, AMYLASE in the last 168 hours. No results for input(s): AMMONIA in the last  168 hours. Coagulation Profile: Recent Labs  Lab Sep 16, 2018 1250 08/30/18 0154  INR 1.86 1.98   CBC: Recent Labs  Lab September 16, 2018 1250 08/30/18 0154  WBC 6.0 5.8  HGB 14.5 13.0  HCT 50.6* 46.9*  MCV 83.6 84.5  PLT 240 217   Cardiac Enzymes: Recent Labs  Lab 09-16-18 1250 09-16-2018 2021 08/30/18 0154 08/30/18 0714  TROPONINI 0.04* 0.04* 0.05* 0.05*   BNP: Invalid input(s): POCBNP CBG: Recent Labs  Lab 2018-09-16 1254 2018/09/16 2204  GLUCAP 237* 175*   HbA1C: No results for input(s): HGBA1C in the last 72 hours. Urine analysis:    Component Value Date/Time   COLORURINE YELLOW 09/16/2018 1416   APPEARANCEUR CLOUDY (A) 09-16-18 1416   LABSPEC 1.015 16-Sep-2018 1416   PHURINE 5.0 09/16/18 1416   GLUCOSEU 50 (A) 09-16-2018 1416   HGBUR MODERATE (A) 09-16-2018 1416   BILIRUBINUR NEGATIVE 09-16-18 1416   KETONESUR NEGATIVE September 16, 2018 1416   PROTEINUR 100 (A) September 16, 2018 1416   UROBILINOGEN 0.2 03/17/2015 1159   NITRITE NEGATIVE Sep 16, 2018 1416   LEUKOCYTESUR NEGATIVE 09-16-2018 1416   Sepsis Labs: @LABRCNTIP (procalcitonin:4,lacticidven:4) )No results found for this or any previous visit (from the past 240 hour(s)).   Scheduled Meds: . chlorhexidine  15 mL Mouth Rinse BID  . furosemide  40 mg Intravenous Q12H  . insulin aspart  0-5 Units Subcutaneous QHS  . insulin aspart  0-9 Units Subcutaneous TID WC  . insulin glargine  10 Units Subcutaneous Q2200  . levothyroxine  150 mcg Oral Q0600  . Living Better with Heart Failure Book   Does not apply Once  . mouth rinse  15 mL Mouth Rinse q12n4p  . warfarin  3 mg Oral Once  . Warfarin - Pharmacist Dosing Inpatient   Does not apply q1800   Continuous Infusions:  Procedures/Studies: Dg Chest 2 View  Result Date: Sep 16, 2018 CLINICAL DATA:  67 year old female with lower extremity weakness, cough and shortness of breath. Multiple falls. EXAM: CHEST - 2 VIEW COMPARISON:  Portable chest 08/10/2017 and earlier.  FINDINGS: Seated AP and lateral views of the chest. Moderate to severe cardiomegaly. Stable mediastinal contours. Indistinct pulmonary vasculature, increased at the lung bases. Patchy bibasilar opacity. No definite pleural effusion, consolidation. Visualized tracheal air column is within normal limits. Osteopenia. No acute osseous abnormality identified. Negative visible bowel gas pattern. IMPRESSION: Mild or developing pulmonary edema suspected along with basilar atelectasis. Moderate to severe cardiomegaly. Electronically Signed   By: Odessa Fleming M.D.   On: 09-16-2018 16:20    Catarina Hartshorn, DO  [facility] Pager [phone]  If 7PM-7AM, please contact night-coverage [email or web address] Password [email or web address] 08/30/2018, 11:04 AM   LOS: 1 day

## 2018-08-30 NOTE — Clinical Social Work Placement (Addendum)
   CLINICAL SOCIAL WORK PLACEMENT  NOTE  Date:  08/30/2018  Patient Details  Name: Amber Armstrong MRN: [id number] Date of Birth: 1951-04-14  Clinical Social Work is seeking post-discharge placement for this patient at the Skilled  Nursing Facility level of care (*CSW will initial, date and re-position this form in  chart as items are completed):  Yes   Patient/family provided with [facility] Clinical Social Work Department's list of facilities offering this level of care within the geographic area requested by the patient (or if unable, by the patient's family).  Yes   Patient/family informed of their freedom to choose among providers that offer the needed level of care, that participate in Medicare, Medicaid or managed care program needed by the patient, have an available bed and are willing to accept the patient.  Yes   Patient/family informed of [facility]'s ownership interest in [facility] and [facility], as well as of the fact that they are under no obligation to receive care at these facilities.  PASRR submitted to EDS on       PASRR number received on       Existing PASRR number confirmed on 08/30/18     FL2 transmitted to all facilities in geographic area requested by pt/family on 08/30/18     FL2 transmitted to all facilities within larger geographic area on       Patient informed that his/her managed care company has contracts with or will negotiate with certain facilities, including the following:            Patient/family informed of bed offers received.  Patient chooses bed at       Physician recommends and patient chooses bed at      Patient to be transferred to   on  .  Patient to be transferred to facility by       Patient family notified on   of transfer.  Name of family member notified:        PHYSICIAN       Additional Comment:  CSW contacted [facility] today to request initiation of authorization for SNF rehab. Pt accepted at [facility].   _______________________________________________ Ida Rogue, LCSW 08/30/2018, 2:22 PM

## 2018-08-30 NOTE — Progress Notes (Signed)
Inpatient Diabetes Program Recommendations  AACE/ADA: New Consensus Statement on Inpatient Glycemic Control (2019)  Target Ranges:  Prepandial:   less than 140 mg/dL      Peak postprandial:   less than 180 mg/dL (1-2 hours)      Critically ill patients:  140 - 180 mg/dL  Results for Amber Armstrong, Amber Armstrong (MRN [id number]) as of 08/30/2018 10:17  Ref. Range 08/30/2018 01:54  Glucose Latest Ref Range: 70 - 99 mg/dL 003 (H)   Results for Amber Armstrong, Amber Armstrong (MRN [id number]) as of 08/30/2018 10:17  Ref. Range Sep 12, 2018 12:54 Sep 12, 2018 22:04  Glucose-Capillary Latest Ref Range: 70 - 99 mg/dL 945 (H) 038 (H)  Results for Amber Armstrong, Amber Armstrong (MRN [id number]) as of 08/30/2018 10:17  Ref. Range 08/11/2017 11:48 10/25/2017 11:27 01/30/2018 00:00 05/30/2018 00:00  Hemoglobin A1C Unknown 13.0 (H) 8.2 (H) 6.6 6.4   Review of Glycemic Control  Diabetes history: DM2 Outpatient Diabetes medications: Lantus 20 QHS (per note by Dr. Fransico Him on 06/13/18; but not listed on home medication list) Current orders for Inpatient glycemic control: Lantus 10 units QHS  Inpatient Diabetes Program Recommendations:  Correction (SSI): While inpatient, please consider ordering CBGs with Novolog 0-9 units TID with meals and Novolog 0-5 units QHS.  Thanks, Orlando Penner, RN, MSN, CDE Diabetes Coordinator Inpatient Diabetes Program [phone] (Team Pager from 8am to 5pm)

## 2018-08-30 NOTE — Progress Notes (Signed)
*  PRELIMINARY RESULTS* Echocardiogram 2D Echocardiogram has been performed.  March, [place] 08/30/2018, 2:19 PM

## 2018-08-30 NOTE — Plan of Care (Signed)
  Problem: Acute Rehab PT Goals(only PT should resolve) Goal: Pt Will Go Supine/Side To Sit Outcome: Progressing Flowsheets (Taken 08/30/2018 1213) Pt will go Supine/Side to Sit: with supervision Goal: Patient Will Transfer Sit To/From Stand Outcome: Progressing Flowsheets (Taken 08/30/2018 1213) Patient will transfer sit to/from stand: with min guard assist Goal: Pt Will Transfer Bed To Chair/Chair To Bed Outcome: Progressing Flowsheets (Taken 08/30/2018 1213) Pt will Transfer Bed to Chair/Chair to Bed: min guard assist Goal: Pt Will Ambulate Outcome: Progressing Flowsheets (Taken 08/30/2018 1213) Pt will Ambulate: with min guard assist; 50 feet; with rolling walker; with cane Note:  Quad cane and/or RW   12:14 PM, 08/30/18 Ocie Bob, MPT Physical Therapist with [facility] [phone] office [phone] mobile phone

## 2018-08-30 NOTE — Progress Notes (Signed)
ANTICOAGULATION CONSULT NOTE - Pharmacy Consult for warfarin Indication: atrial fibrillation  Allergies  Allergen Reactions  . Penicillins Rash    Has patient had a PCN reaction causing immediate rash, facial/tongue/throat swelling, SOB or lightheadedness with hypotension: No Has patient had a PCN reaction causing severe rash involving mucus membranes or skin necrosis: Yes Has patient had a PCN reaction that required hospitalization: No Has patient had a PCN reaction occurring within the last 10 years: No If all of the above answers are "NO", then may proceed with Cephalosporin use.     Patient Measurements: Height: 5' (152.4 cm) Weight: 208 lb 14.4 oz (94.8 kg) IBW/kg (Calculated) : 45.5   Vital Signs: Temp: 97.9 F (36.6 C) (11/07 0620) BP: 106/82 (11/07 0620) Pulse Rate: 94 (11/07 0620)  Labs: Recent Labs    09/06/2018 1250 09/18/2018 2021 08/30/18 0154  HGB 14.5  --  13.0  HCT 50.6*  --  46.9*  PLT 240  --  217  LABPROT 21.2*  --  22.2*  INR 1.86  --  1.98  CREATININE 2.28*  --  2.32*  TROPONINI 0.04* 0.04* 0.05*    Estimated Creatinine Clearance: 23.9 mL/min (A) (by C-G formula based on SCr of 2.32 mg/dL (H)).   Medical History: Past Medical History:  Diagnosis Date  . Atrial fibrillation ([place])   . Chronic diastolic heart failure ([place])   . Hyperglycemia without ketosis 03/17/2015  . Hypertension   . Hypothyroidism (acquired)   . Nephrolithiasis   . Overweight(278.02)   . Right ventricular hypertrophy 12/12/2012  . Type 2 diabetes mellitus ([facility])    Type II  . UTI (urinary tract infection)     Medications:  Medications Prior to Admission  Medication Sig Dispense Refill Last Dose  . furosemide (LASIX) 20 MG tablet Take 20 mg by mouth 2 (two) times daily.    08/28/2018 at Unknown time  . levothyroxine (SYNTHROID, LEVOTHROID) 150 MCG tablet Take 150 mcg by mouth daily before breakfast.   08/28/2018 at Unknown time  . warfarin (COUMADIN) 3 MG tablet Take 1 1/2  tablets daily or as directed (Patient taking differently: Take 3-4.5 mg by mouth See admin instructions. 14m on all days except on Wednesdays and Saturdays. Take 4.555mon Wednesdays and Saturdays only) 45 tablet 0 08/28/2018 at 1800  . amLODipine (NORVASC) 10 MG tablet Take 10 mg by mouth daily. **   Not Taking at Unknown time  . blood glucose meter kit and supplies Dispense based on patient and insurance preference. Use up to four times daily as directed. (FOR ICD-9 250.00, 250.01). 1 each 0 Taking  . glucose blood test strip 1 each by Other route 2 (two) times daily. Use as instructed   Taking  . lisinopril (PRINIVIL,ZESTRIL) 5 MG tablet Take 5 mg by mouth daily. **   Not Taking at Unknown time  . metoprolol (LOPRESSOR) 50 MG tablet Take 1.5 tablets (75 mg total) by mouth 2 (two) times daily. (Patient not taking: Reported on 09/04/2018) 90 tablet 6 Not Taking at Unknown time    Assessment: Pharmacy consulted to dose warfarin for patient with atrial fibrillation.  Patient's home dose is listed as 4.5 mg on Wed and Sat and 3 mg ROW. INR 1.98  Goal of Therapy:  INR 2-3 Monitor platelets by anticoagulation protocol: Yes   Plan:  Warfarin 3 mg x 1 dose. Monitor daily INR and s/s of bleeding  StRamond Craver1/04/2018,8:15 AM

## 2018-08-30 NOTE — Evaluation (Signed)
Physical Therapy Evaluation Patient Details Name: Amber Armstrong MRN: [id number] DOB: 1950-11-24 Today's Date: 08/30/2018   History of Present Illness  Amber Armstrong is a 67 y.o. female with medical history significant for diastolic CHF, IDDM, A. fib on warfarin, hypothyroidism, obesity, hyperlipidemia & hypertension who came to ED out of concern for "elevated INR greater than 8".    Clinical Impression  Patient limited for functional mobility as stated below secondary to BLE weakness, fatigue and poor standing balance.  Patient on room air when taking steps in room with O2 saturation dropping below 85%, prior to walking O2 saturation at 93-95%, after walking and sitting up at bedside O2 saturation at 90%.  Patient put back on 2 LPM O2 after therapy. Patient will benefit from continued physical therapy in hospital and recommended venue below to increase strength, balance, endurance for safe ADLs and gait.     Follow Up Recommendations SNF    Equipment Recommendations  None recommended by PT    Recommendations for Other Services       Precautions / Restrictions Precautions Precautions: Fall Precaution Comments: monitor O2 saturation when on room air Restrictions Weight Bearing Restrictions: No      Mobility  Bed Mobility Overal bed mobility: Needs Assistance Bed Mobility: Supine to Sit;Sit to Supine     Supine to sit: Min assist Sit to supine: Supervision   General bed mobility comments: slow labored movement for sitting up at bedside  Transfers Overall transfer level: Needs assistance Equipment used: Quad cane Transfers: Sit to/from [employer] Sit to Stand: Min assist Stand pivot transfers: Min assist       General transfer comment: required hand held assist due to poor standing balance  Ambulation/Gait Ambulation/Gait assistance: Min assist;Mod assist Gait Distance (Feet): 15 Feet Assistive device: Quad cane Gait Pattern/deviations: Decreased  step length - right;Decreased step length - left;Step-to pattern;Decreased stride length Gait velocity: slow   General Gait Details: slow labored unsteady cadence requring hand held assist to maintain standing balance when using SPC, limited mostly due to c/o fatigue, O2 saturation dropped below 85% while on room air while walking  Stairs            Wheelchair Mobility    Modified Rankin (Stroke Patients Only)       Balance Overall balance assessment: Needs assistance Sitting-balance support: Feet supported;No upper extremity supported Sitting balance-Leahy Scale: Good     Standing balance support: Single extremity supported;During functional activity Standing balance-Leahy Scale: Poor Standing balance comment: fair/poor using Quad-cane                             Pertinent Vitals/Pain Pain Assessment: No/denies pain    Home Living Family/patient expects to be discharged to:: Private residence Living Arrangements: Parent Available Help at Discharge: Family;Available PRN/intermittently Type of Home: House Home Access: Ramped entrance     Home Layout: One level Home Equipment: Gilmer Mor - quad;Walker - 2 wheels;Wheelchair - manual;Shower seat;Bedside commode      Prior Function Level of Independence: Independent with assistive device(s)         Comments: household and short distanced community ambulator with quad-cane, takes care of her mother per patient     Hand Dominance   Dominant Hand: Right    Extremity/Trunk Assessment   Upper Extremity Assessment Upper Extremity Assessment: Defer to OT evaluation RUE Deficits / Details: Decreased ROM and strength  RUE Sensation: WNL RUE Coordination: decreased fine  motor LUE Deficits / Details: Decreased ROM and strength  LUE Sensation: WNL LUE Coordination: decreased fine motor    Lower Extremity Assessment Lower Extremity Assessment: Generalized weakness    Cervical / Trunk Assessment Cervical  / Trunk Assessment: Normal  Communication   Communication: No difficulties  Cognition Arousal/Alertness: Awake/alert Behavior During Therapy: WFL for tasks assessed/performed Overall Cognitive Status: Within Functional Limits for tasks assessed                                        General Comments      Exercises     Assessment/Plan    PT Assessment Patient needs continued PT services  PT Problem List Decreased strength;Decreased activity tolerance;Decreased balance;Decreased mobility       PT Treatment Interventions Gait training;Functional mobility training;Therapeutic activities;Therapeutic exercise;Stair training;Patient/family education    PT Goals (Current goals can be found in the Care Plan section)  Acute Rehab PT Goals Patient Stated Goal: return home after rehab PT Goal Formulation: With patient Time For Goal Achievement: 09/13/18 Potential to Achieve Goals: Good    Frequency Min 3X/week   Barriers to discharge        Co-evaluation               AM-PAC PT "6 Clicks" Daily Activity  Outcome Measure Difficulty turning over in bed (including adjusting bedclothes, sheets and blankets)?: A Little Difficulty moving from lying on back to sitting on the side of the bed? : Unable Difficulty sitting down on and standing up from a chair with arms (e.g., wheelchair, bedside commode, etc,.)?: Unable Help needed moving to and from a bed to chair (including a wheelchair)?: A Little Help needed walking in hospital room?: A Lot Help needed climbing 3-5 steps with a railing? : Total 6 Click Score: 11    End of Session Equipment Utilized During Treatment: Gait belt Activity Tolerance: Patient limited by fatigue;Patient tolerated treatment well(Patient limited by SOB) Patient left: in bed;with call bell/phone within reach(seated at bedside) Nurse Communication: Mobility status PT Visit Diagnosis: Unsteadiness on feet (R26.81);Other abnormalities of  gait and mobility (R26.89);Muscle weakness (generalized) (M62.81)    Time: 5825-1898 PT Time Calculation (min) (ACUTE ONLY): 28 min   Charges:   PT Evaluation $PT Eval Moderate Complexity: 1 Mod PT Treatments $Therapeutic Activity: 23-37 mins        12:11 PM, 08/30/18 Ocie Bob, MPT Physical Therapist with [facility] 336 [phone] office [phone] mobile phone

## 2018-08-30 NOTE — Evaluation (Signed)
Occupational Therapy Evaluation Patient Details Name: Amber Armstrong MRN: [id number] DOB: 09-30-1951 Today's Date: 08/30/2018    History of Present Illness Amber Armstrong is a 67 y.o. female with medical history significant for diastolic CHF, IDDM, A. fib on warfarin, hypothyroidism, obesity, hyperlipidemia & hypertension who came to ED out of concern for "elevated INR greater than 8".   Clinical Impression   Pt presents seated at EOB, agreaeble to see OT. Prior to admission, pt needed assistance with some ADLs, and used assistive devices for mobility. Pt presents with BUE weakness, decreased ROM, and coordination, as well as poor activity tolerance limiting ability to complete ADLs. Unable to assess ADL completion in standing as pt refused to participate in any standing ADLs this am. Recommend SNF on discharge to improve safety and independence in ADL completion, as well as improve strength and activity tolerance for functional task completion. No further acute care OT needs. Thank you for this recommendation.       Follow Up Recommendations    SNF   Equipment Recommendations  None recommended by OT       Precautions / Restrictions Precautions Precautions: Fall      Mobility Bed Mobility                Pt seated at EOB on OT arrival, finishing breakfast.            ADL either performed or assessed with clinical judgement   ADL Overall ADL's : Needs assistance/impaired                     Lower Body Dressing: Total assistance                 General ADL Comments: Pt not willing to get up from bed to fully assess ADL independence      Vision Baseline Vision/History: Wears glasses              Pertinent Vitals/Pain Pain Assessment: No/denies pain     Hand Dominance Right   Extremity/Trunk Assessment Upper Extremity Assessment Upper Extremity Assessment: RUE deficits/detail;LUE deficits/detail RUE Deficits / Details: Decreased ROM and  strength  RUE Sensation: WNL RUE Coordination: decreased fine motor LUE Deficits / Details: Decreased ROM and strength  LUE Sensation: WNL LUE Coordination: decreased fine motor   Lower Extremity Assessment Lower Extremity Assessment: Defer to PT evaluation   Cervical / Trunk Assessment Cervical / Trunk Assessment: Normal   Communication Communication Communication: No difficulties   Cognition Arousal/Alertness: Awake/alert Behavior During Therapy: WFL for tasks assessed/performed Overall Cognitive Status: Within Functional Limits for tasks assessed                                                Home Living Family/patient expects to be discharged to:: Private residence Living Arrangements: Parent                                      Prior Functioning/Environment Level of Independence: Needs assistance        Comments: Prior to admission, pt required help donning and doffing shoes and socks. Pt reports that she was Mod I with assistive devices, but she appears to need more assistance than she reports. In addition, a HH aide comes in  several times a week to help pt with her mother, according to her report.         OT Problem List: Decreased strength;Decreased range of motion;Decreased activity tolerance;Decreased coordination;Decreased knowledge of use of DME or AE;Cardiopulmonary status limiting activity;Impaired UE functional use;Pain       End of Session Equipment Utilized During Treatment: Oxygen  Activity Tolerance: Patient tolerated treatment well Patient left: in bed;with call bell/phone within reach  OT Visit Diagnosis: Muscle weakness (generalized) (M62.81)                Time: 0981-1914 OT Time Calculation (min): 23 min Charges:  OT General Charges $OT Visit: 1 Visit OT Evaluation $OT Eval Low Complexity: 1 Low   Ezra Sites, OTR/L  [phone] 08/30/2018, 11:19 AM

## 2018-08-31 ENCOUNTER — Inpatient Hospital Stay (HOSPITAL_COMMUNITY): Payer: PPO

## 2018-08-31 DIAGNOSIS — E1165 Type 2 diabetes mellitus with hyperglycemia: Secondary | ICD-10-CM

## 2018-08-31 DIAGNOSIS — N170 Acute kidney failure with tubular necrosis: Secondary | ICD-10-CM

## 2018-08-31 DIAGNOSIS — E1122 Type 2 diabetes mellitus with diabetic chronic kidney disease: Secondary | ICD-10-CM

## 2018-08-31 DIAGNOSIS — I5041 Acute combined systolic (congestive) and diastolic (congestive) heart failure: Secondary | ICD-10-CM

## 2018-08-31 LAB — BASIC METABOLIC PANEL
ANION GAP: 12 (ref 5–15)
BUN: 66 mg/dL — AB (ref 8–23)
CALCIUM: 8 mg/dL — AB (ref 8.9–10.3)
CO2: 17 mmol/L — ABNORMAL LOW (ref 22–32)
CREATININE: 2.52 mg/dL — AB (ref 0.44–1.00)
Chloride: 109 mmol/L (ref 98–111)
GFR calc Af Amer: 21 mL/min — ABNORMAL LOW (ref >60)
GFR, EST NON AFRICAN AMERICAN: 18 mL/min — AB (ref >60)
GLUCOSE: 142 mg/dL — AB (ref 70–99)
Potassium: 4.3 mmol/L (ref 3.5–5.1)
Sodium: 138 mmol/L (ref 135–145)

## 2018-08-31 LAB — GLUCOSE, CAPILLARY
GLUCOSE-CAPILLARY: 131 mg/dL — AB (ref 70–99)
Glucose-Capillary: 146 mg/dL — ABNORMAL HIGH (ref 70–99)
Glucose-Capillary: 111 mg/dL — ABNORMAL HIGH (ref 70–99)
Glucose-Capillary: 126 mg/dL — ABNORMAL HIGH (ref 70–99)

## 2018-08-31 LAB — PROTIME-INR
INR: 2.38
Prothrombin Time: 25.7 seconds — ABNORMAL HIGH (ref 11.4–15.2)

## 2018-08-31 LAB — HIV ANTIBODY (ROUTINE TESTING W REFLEX): HIV SCREEN 4TH GENERATION: NONREACTIVE

## 2018-08-31 LAB — MAGNESIUM: MAGNESIUM: 2.4 mg/dL (ref 1.7–2.4)

## 2018-08-31 MED ORDER — FUROSEMIDE 40 MG PO TABS
40.0000 mg | ORAL_TABLET | Freq: Every day | ORAL | Status: DC
  Administered 2018-09-01: 40 mg via ORAL
  Filled 2018-08-31: qty 1

## 2018-08-31 MED ORDER — WARFARIN SODIUM 2 MG PO TABS
3.0000 mg | ORAL_TABLET | Freq: Once | ORAL | Status: AC
  Administered 2018-08-31: 3 mg via ORAL
  Filled 2018-08-31: qty 1

## 2018-08-31 NOTE — Plan of Care (Signed)
  Problem: Education: Goal: Knowledge of General Education information will improve Description: Including pain rating scale, medication(s)/side effects and non-pharmacologic comfort measures Outcome: Progressing   Problem: Health Behavior/Discharge Planning: Goal: Ability to manage health-related needs will improve Outcome: Progressing   Problem: Clinical Measurements: Goal: Ability to maintain clinical measurements within normal limits will improve Outcome: Progressing Goal: Will remain free from infection Outcome: Progressing Goal: Diagnostic test results will improve Outcome: Progressing Goal: Respiratory complications will improve Outcome: Progressing Goal: Cardiovascular complication will be avoided Outcome: Progressing   Problem: Activity: Goal: Risk for activity intolerance will decrease Outcome: Progressing   Problem: Nutrition: Goal: Adequate nutrition will be maintained Outcome: Progressing   Problem: Coping: Goal: Level of anxiety will decrease Outcome: Progressing   Problem: Elimination: Goal: Will not experience complications related to bowel motility Outcome: Progressing Goal: Will not experience complications related to urinary retention Outcome: Progressing   Problem: Pain Managment: Goal: General experience of comfort will improve Outcome: Progressing   Problem: Safety: Goal: Ability to remain free from injury will improve Outcome: Progressing   Problem: Safety: Goal: Ability to remain free from injury will improve Outcome: Progressing   Problem: Skin Integrity: Goal: Risk for impaired skin integrity will decrease Outcome: Progressing   Problem: Education: Goal: Ability to demonstrate management of disease process will improve Outcome: Progressing Goal: Ability to verbalize understanding of medication therapies will improve Outcome: Progressing Goal: Individualized Educational Video(s) Outcome: Progressing   Problem: Activity: Goal:  Capacity to carry out activities will improve Outcome: Progressing   Problem: Cardiac: Goal: Ability to achieve and maintain adequate cardiopulmonary perfusion will improve Outcome: Progressing   

## 2018-08-31 NOTE — Clinical Social Work Placement (Addendum)
   CLINICAL SOCIAL WORK PLACEMENT  NOTE  Date:  08/31/2018  Patient Details  Name: Amber Armstrong MRN: [id number] Date of Birth: [birth year]  Clinical Social Work is seeking post-discharge placement for this patient at the Skilled  Nursing Facility level of care (*CSW will initial, date and re-position this form in  chart as items are completed):  Yes   Patient/family provided with [facility] Clinical Social Work Department's list of facilities offering this level of care within the geographic area requested by the patient (or if unable, by the patient's family).  Yes   Patient/family informed of their freedom to choose among providers that offer the needed level of care, that participate in Medicare, Medicaid or managed care program needed by the patient, have an available bed and are willing to accept the patient.  Yes   Patient/family informed of [facility]'s ownership interest in [facility] and [facility], as well as of the fact that they are under no obligation to receive care at these facilities.  PASRR submitted to EDS on       PASRR number received on       Existing PASRR number confirmed on 08/30/18     FL2 transmitted to all facilities in geographic area requested by pt/family on 08/30/18     FL2 transmitted to all facilities within larger geographic area on       Patient informed that his/her managed care company has contracts with or will negotiate with certain facilities, including the following:            Patient/family informed of bed offers received.  Patient chooses bed at [facility]     Physician recommends and patient chooses bed at      Patient to be transferred to   on  .  Patient to be transferred to facility by       Patient family notified on   of transfer.  Name of family member notified:        PHYSICIAN       Additional Comment: Her auth # for 7 days from [facility] is [id number]. Awaiting medical stabilization for transfer. Pt  requesting placement at [facility] her mind as she decided she wants to be closer to home in [name].   _______________________________________________ Ida Rogue, LCSW 08/31/2018, 2:41 PM

## 2018-08-31 NOTE — Progress Notes (Signed)
PROGRESS NOTE  Amber Armstrong EXN:[id number] DOB: 07-21-1951 DOA: 09/07/2018 PCP: Wilson Singer, MD  Brief History:  67 year old female with a history of diastolic CHF, diabetes mellitus type 2, chronic atrial fibrillation on warfarin, hypothyroidism, hypertension, hyperlipidemia presenting with 2 to 3-week history of shortness of breath and orthopnea.  The patient states that she was instructed to decrease her furosemide from 20 mg twice daily to 20 mg daily approximately 1 month prior to this admission secondary to worsening renal function.  She endorses compliance with all her medications, but states that she often does not follow a cardiac type diet.  She denies any fevers, chills, chest pain, nausea, vomiting, diarrhea, abdominal pain, dysuria, hematuria, dizziness, headache, neck pain.  Upon presentation, the patient was noted to have oxygen saturation in the 80s.  She was afebrile hemodynamically stable.  BNP was 2597.  Chest x-ray showed pulmonary edema.  Patient was started on IV furosemide.  Assessment/Plan: Acute on chronic diastolic CHF -Primarily right heart failure/cor pulmonale? -07/05/2018 echo EF 60 to 65%, no WMA, moderate TR, PASP 79, moderate pericardial effusion -Continue furosemide IV 40 mg twice daily -Daily weights--NEG 2 kg -Accurate I's and O's  Acute respiratory failure with hypoxia -Secondary to pulmonary edema -Presently stable on 2 L -Wean oxygen back to room as tolerated  Acute on chronic renal failure--CKD stage III -Previous renal baseline 1.3-1.6 -The patient likely has progression of her underlying renal disease -Renal ultrasound--neg hydronephrosis -Holding lisinopril  Diabetes mellitus type 2, controlled -05/30/2018 hemoglobin A 1 C--6.4 -Continue reduced dose Lantus -NovoLog sliding scale  Chronic atrial fibrillation -Rate controlled -Continue metoprolol tartrate -Continue warfarin  Hypothyroidism -Continue  Synthroid  Morbid obesity -BMI 40.80 -Lifestyle modification    Disposition Plan:   SNF in 1-2 days  Family Communication:   Cousin updated at bedside 11/7  Consultants:  none  Code Status:  FULL   DVT Prophylaxis:  warfarin   Procedures: As Listed in Progress Note Above  Antibiotics: None     Subjective: Pt is breathing better, but still has some dyspnea on exertion.  No cp, n/v/d, abd pain.    Objective: Vitals:   08/30/18 2100 08/31/18 0500 08/31/18 0706 08/31/18 1500  BP: 127/81  104/76 117/78  Pulse: (!) 107  90 95  Resp: 16  (!) 21 18  Temp: 97.8 F (36.6 C)  (!) 97.4 F (36.3 C) 97.9 F (36.6 C)  TempSrc: Oral  Oral Oral  SpO2:   97% 98%  Weight:  92.8 kg    Height:        Intake/Output Summary (Last 24 hours) at 08/31/2018 1529 Last data filed at 08/31/2018 1247 Gross per 24 hour  Intake 720 ml  Output 500 ml  Net 220 ml   Weight change: 8.891 kg Exam:   General:  Pt is alert, follows commands appropriately, not in acute distress  HEENT: No icterus, No thrush, No neck mass, [facility]/AT  Cardiovascular: RRR, S1/S2, no rubs, no gallops  Respiratory: bibasilar crackles, no wheeze  Abdomen: Soft/+BS, non tender, non distended, no guarding  Extremities: 1 + LE edema, No lymphangitis, No petechiae, No rashes, no synovitis   Data Reviewed: I have personally reviewed following labs and imaging studies Basic Metabolic Panel: Recent Labs  Lab 09/09/2018 1250 08/30/18 0154 08/31/18 0529  NA 140 136 138  K 4.2 4.3 4.3  CL 107 108 109  CO2 24 19* 17*  GLUCOSE 237* 166* 142*  BUN 57* 57* 66*  CREATININE 2.28* 2.32* 2.52*  CALCIUM 8.7* 8.3* 8.0*  MG  --   --  2.4   Liver Function Tests: No results for input(s): AST, ALT, ALKPHOS, BILITOT, PROT, ALBUMIN in the last 168 hours. No results for input(s): LIPASE, AMYLASE in the last 168 hours. No results for input(s): AMMONIA in the last 168 hours. Coagulation Profile: Recent Labs  Lab  08/31/2018 1250 08/30/18 0154 08/31/18 0529  INR 1.86 1.98 2.38   CBC: Recent Labs  Lab 09/11/2018 1250 08/30/18 0154  WBC 6.0 5.8  HGB 14.5 13.0  HCT 50.6* 46.9*  MCV 83.6 84.5  PLT 240 217   Cardiac Enzymes: Recent Labs  Lab 08/30/2018 1250 08/26/2018 2021 08/30/18 0154 08/30/18 0714  TROPONINI 0.04* 0.04* 0.05* 0.05*   BNP: Invalid input(s): POCBNP CBG: Recent Labs  Lab 08/30/18 1159 08/30/18 1650 08/30/18 2127 08/31/18 0746 08/31/18 1126  GLUCAP 178* 125* 155* 131* 146*   HbA1C: No results for input(s): HGBA1C in the last 72 hours. Urine analysis:    Component Value Date/Time   COLORURINE YELLOW 08/24/2018 1416   APPEARANCEUR CLOUDY (A) 09/15/2018 1416   LABSPEC 1.015 09/16/2018 1416   PHURINE 5.0 08/25/2018 1416   GLUCOSEU 50 (A) 08/24/2018 1416   HGBUR MODERATE (A) 09/09/2018 1416   BILIRUBINUR NEGATIVE 09/21/2018 1416   KETONESUR NEGATIVE 08/28/2018 1416   PROTEINUR 100 (A) 09/18/2018 1416   UROBILINOGEN 0.2 03/17/2015 1159   NITRITE NEGATIVE 08/30/2018 1416   LEUKOCYTESUR NEGATIVE 09/04/2018 1416   Sepsis Labs: @LABRCNTIP (procalcitonin:4,lacticidven:4) )No results found for this or any previous visit (from the past 240 hour(s)).   Scheduled Meds: . chlorhexidine  15 mL Mouth Rinse BID  . furosemide  40 mg Intravenous Q12H  . insulin aspart  0-5 Units Subcutaneous QHS  . insulin aspart  0-9 Units Subcutaneous TID WC  . insulin glargine  10 Units Subcutaneous Q2200  . levothyroxine  150 mcg Oral Q0600  . Living Better with Heart Failure Book   Does not apply Once  . mouth rinse  15 mL Mouth Rinse q12n4p  . warfarin  3 mg Oral Once  . Warfarin - Pharmacist Dosing Inpatient   Does not apply q1800   Continuous Infusions:  Procedures/Studies: Dg Chest 2 View  Result Date: 09/09/2018 CLINICAL DATA:  67 year old female with lower extremity weakness, cough and shortness of breath. Multiple falls. EXAM: CHEST - 2 VIEW COMPARISON:  Portable chest  08/10/2017 and earlier. FINDINGS: Seated AP and lateral views of the chest. Moderate to severe cardiomegaly. Stable mediastinal contours. Indistinct pulmonary vasculature, increased at the lung bases. Patchy bibasilar opacity. No definite pleural effusion, consolidation. Visualized tracheal air column is within normal limits. Osteopenia. No acute osseous abnormality identified. Negative visible bowel gas pattern. IMPRESSION: Mild or developing pulmonary edema suspected along with basilar atelectasis. Moderate to severe cardiomegaly. Electronically Signed   By: Odessa Fleming M.D.   On: 09/22/2018 16:20   US Renal  Result Date: 08/31/2018 CLINICAL DATA:  Acute on chronic renal failure EXAM: RENAL / URINARY TRACT ULTRASOUND COMPLETE COMPARISON:  None. FINDINGS: Right Kidney: 8 cm length. There is generalized cortical thinning with smooth appearance. No hydronephrosis. Left Kidney: Renal measurements: 9.6 x 5.7 x 5.8 cm = volume: 167 mL. Echogenicity within normal limits. No mass or hydronephrosis visualized. Bladder: Moderately distended.  No wall thickening or debris is seen. Small volume ascites seen about the liver. IMPRESSION: 1. Advanced right renal atrophy. 2. No hydronephrosis. 3. Moderate distension of the  bladder. Electronically Signed   By: Marnee Spring M.D.   On: 08/31/2018 11:15    Catarina Hartshorn, DO  [facility] Pager [phone]  If 7PM-7AM, please contact night-coverage [email or web address] Password [email or web address] 08/31/2018, 3:29 PM   LOS: 2 days

## 2018-08-31 NOTE — Progress Notes (Signed)
ANTICOAGULATION CONSULT NOTE - Pharmacy Consult for warfarin Indication: atrial fibrillation  Allergies  Allergen Reactions  . Penicillins Rash    Has patient had a PCN reaction causing immediate rash, facial/tongue/throat swelling, SOB or lightheadedness with hypotension: No Has patient had a PCN reaction causing severe rash involving mucus membranes or skin necrosis: Yes Has patient had a PCN reaction that required hospitalization: No Has patient had a PCN reaction occurring within the last 10 years: No If all of the above answers are "NO", then may proceed with Cephalosporin use.     Patient Measurements: Height: 5' (152.4 cm) Weight: 204 lb 9.6 oz (92.8 kg) IBW/kg (Calculated) : 45.5   Vital Signs: Temp: 97.4 F (36.3 C) (11/08 0706) Temp Source: Oral (11/08 0706) BP: 104/76 (11/08 0706) Pulse Rate: 90 (11/08 0706)  Labs: Recent Labs    09/04/2018 1250 09/13/2018 2021 08/30/18 0154 08/30/18 0714 08/31/18 0529  HGB 14.5  --  13.0  --   --   HCT 50.6*  --  46.9*  --   --   PLT 240  --  217  --   --   LABPROT 21.2*  --  22.2*  --  25.7*  INR 1.86  --  1.98  --  2.38  CREATININE 2.28*  --  2.32*  --  2.52*  TROPONINI 0.04* 0.04* 0.05* 0.05*  --     Estimated Creatinine Clearance: 21.7 mL/min (A) (by C-G formula based on SCr of 2.52 mg/dL (H)).   Medical History: Past Medical History:  Diagnosis Date  . Atrial fibrillation ([place])   . Chronic diastolic heart failure ([place])   . Hyperglycemia without ketosis 03/17/2015  . Hypertension   . Hypothyroidism (acquired)   . Nephrolithiasis   . Overweight(278.02)   . Right ventricular hypertrophy 12/12/2012  . Type 2 diabetes mellitus ([facility])    Type II  . UTI (urinary tract infection)     Medications:  Medications Prior to Admission  Medication Sig Dispense Refill Last Dose  . furosemide (LASIX) 20 MG tablet Take 20 mg by mouth 2 (two) times daily.    08/28/2018 at Unknown time  . levothyroxine (SYNTHROID, LEVOTHROID)  150 MCG tablet Take 150 mcg by mouth daily before breakfast.   08/28/2018 at Unknown time  . warfarin (COUMADIN) 3 MG tablet Take 1 1/2 tablets daily or as directed (Patient taking differently: Take 3-4.5 mg by mouth See admin instructions. 31m on all days except on Wednesdays and Saturdays. Take 4.568mon Wednesdays and Saturdays only) 45 tablet 0 08/28/2018 at 1800  . amLODipine (NORVASC) 10 MG tablet Take 10 mg by mouth daily. **   Not Taking at Unknown time  . blood glucose meter kit and supplies Dispense based on patient and insurance preference. Use up to four times daily as directed. (FOR ICD-9 250.00, 250.01). 1 each 0 Taking  . glucose blood test strip 1 each by Other route 2 (two) times daily. Use as instructed   Taking  . lisinopril (PRINIVIL,ZESTRIL) 5 MG tablet Take 5 mg by mouth daily. **   Not Taking at Unknown time  . metoprolol (LOPRESSOR) 50 MG tablet Take 1.5 tablets (75 mg total) by mouth 2 (two) times daily. (Patient not taking: Reported on 09/16/2018) 90 tablet 6 Not Taking at Unknown time    Assessment: Pharmacy consulted to dose warfarin for patient with atrial fibrillation INR 2.38, therapeutic.   Home dose is listed as 4.5 mg on Wed and Sat and 3 mg ROW.  Goal of Therapy:  INR 2-3 Monitor platelets by anticoagulation protocol: Yes   Plan:  Warfarin 3 mg x 1 today Monitor daily INR and s/s of bleeding  Isac Sarna, BS [place], BCPS Clinical Pharmacist Pager [phone] 08/31/2018,11:02 AM

## 2018-09-01 ENCOUNTER — Inpatient Hospital Stay (HOSPITAL_COMMUNITY): Payer: PPO

## 2018-09-01 LAB — CBC
HCT: 51.6 % — ABNORMAL HIGH (ref 36.0–46.0)
Hemoglobin: 13.8 g/dL (ref 12.0–15.0)
MCH: 23.6 pg — AB (ref 26.0–34.0)
MCHC: 26.7 g/dL — AB (ref 30.0–36.0)
MCV: 88.4 fL (ref 80.0–100.0)
NRBC: 0.8 % — AB (ref 0.0–0.2)
PLATELETS: 163 10*3/uL (ref 150–400)
RBC: 5.84 MIL/uL — ABNORMAL HIGH (ref 3.87–5.11)
RDW: 19.6 % — AB (ref 11.5–15.5)
WBC: 5 10*3/uL (ref 4.0–10.5)

## 2018-09-01 LAB — GLUCOSE, CAPILLARY
GLUCOSE-CAPILLARY: 106 mg/dL — AB (ref 70–99)
Glucose-Capillary: 120 mg/dL — ABNORMAL HIGH (ref 70–99)
Glucose-Capillary: 106 mg/dL — ABNORMAL HIGH (ref 70–99)
Glucose-Capillary: 109 mg/dL — ABNORMAL HIGH (ref 70–99)

## 2018-09-01 LAB — BASIC METABOLIC PANEL
ANION GAP: 11 (ref 5–15)
BUN: 65 mg/dL — ABNORMAL HIGH (ref 8–23)
CALCIUM: 8.6 mg/dL — AB (ref 8.9–10.3)
CHLORIDE: 109 mmol/L (ref 98–111)
CO2: 21 mmol/L — AB (ref 22–32)
Creatinine, Ser: 2.67 mg/dL — ABNORMAL HIGH (ref 0.44–1.00)
GFR calc non Af Amer: 17 mL/min — ABNORMAL LOW (ref >60)
GFR, EST AFRICAN AMERICAN: 20 mL/min — AB (ref >60)
GLUCOSE: 120 mg/dL — AB (ref 70–99)
Potassium: 4.5 mmol/L (ref 3.5–5.1)
Sodium: 141 mmol/L (ref 135–145)

## 2018-09-01 LAB — PROTIME-INR
INR: 2.22
PROTHROMBIN TIME: 24.3 s — AB (ref 11.4–15.2)

## 2018-09-01 LAB — PROCALCITONIN: Procalcitonin: 0.14 ng/mL

## 2018-09-01 LAB — BRAIN NATRIURETIC PEPTIDE: B Natriuretic Peptide: 4055 pg/mL — ABNORMAL HIGH (ref 0.0–100.0)

## 2018-09-01 MED ORDER — WARFARIN SODIUM 2.5 MG PO TABS
4.5000 mg | ORAL_TABLET | Freq: Once | ORAL | Status: AC
  Administered 2018-09-01: 4.5 mg via ORAL
  Filled 2018-09-01: qty 1

## 2018-09-01 MED ORDER — FUROSEMIDE 10 MG/ML IJ SOLN
40.0000 mg | Freq: Two times a day (BID) | INTRAMUSCULAR | Status: DC
  Administered 2018-09-01 – 2018-09-02 (×2): 40 mg via INTRAVENOUS
  Filled 2018-09-01 (×2): qty 4

## 2018-09-01 MED ORDER — METOPROLOL TARTRATE 25 MG PO TABS
12.5000 mg | ORAL_TABLET | Freq: Two times a day (BID) | ORAL | Status: DC
  Administered 2018-09-01 (×2): 12.5 mg via ORAL
  Filled 2018-09-01 (×2): qty 1

## 2018-09-01 NOTE — Progress Notes (Signed)
ANTICOAGULATION CONSULT NOTE - Pharmacy Consult for warfarin Indication: atrial fibrillation  Allergies  Allergen Reactions  . Penicillins Rash    Has patient had a PCN reaction causing immediate rash, facial/tongue/throat swelling, SOB or lightheadedness with hypotension: No Has patient had a PCN reaction causing severe rash involving mucus membranes or skin necrosis: Yes Has patient had a PCN reaction that required hospitalization: No Has patient had a PCN reaction occurring within the last 10 years: No If all of the above answers are "NO", then may proceed with Cephalosporin use.     Patient Measurements: Height: 5' (152.4 cm) Weight: 203 lb 7.8 oz (92.3 kg) IBW/kg (Calculated) : 45.5   Vital Signs: BP: 94/69 (11/09 0543) Pulse Rate: 91 (11/09 0543)  Labs: Recent Labs    09/04/2018 1250 09/14/2018 2021 08/30/18 0154 08/30/18 0714 08/31/18 0529 09/01/18 0657  HGB 14.5  --  13.0  --   --   --   HCT 50.6*  --  46.9*  --   --   --   PLT 240  --  217  --   --   --   LABPROT 21.2*  --  22.2*  --  25.7* 24.3*  INR 1.86  --  1.98  --  2.38 2.22  CREATININE 2.28*  --  2.32*  --  2.52* 2.67*  TROPONINI 0.04* 0.04* 0.05* 0.05*  --   --     Estimated Creatinine Clearance: 20.4 mL/min (A) (by C-G formula based on SCr of 2.67 mg/dL (H)).   Medical History: Past Medical History:  Diagnosis Date  . Atrial fibrillation ([place])   . Chronic diastolic heart failure ([place])   . Hyperglycemia without ketosis 03/17/2015  . Hypertension   . Hypothyroidism (acquired)   . Nephrolithiasis   . Overweight(278.02)   . Right ventricular hypertrophy 12/12/2012  . Type 2 diabetes mellitus ([facility])    Type II  . UTI (urinary tract infection)     Medications:  Medications Prior to Admission  Medication Sig Dispense Refill Last Dose  . furosemide (LASIX) 20 MG tablet Take 20 mg by mouth 2 (two) times daily.    08/28/2018 at Unknown time  . levothyroxine (SYNTHROID, LEVOTHROID) 150 MCG tablet Take  150 mcg by mouth daily before breakfast.   08/28/2018 at Unknown time  . warfarin (COUMADIN) 3 MG tablet Take 1 1/2 tablets daily or as directed (Patient taking differently: Take 3-4.5 mg by mouth See admin instructions. 20m on all days except on Wednesdays and Saturdays. Take 4.525mon Wednesdays and Saturdays only) 45 tablet 0 08/28/2018 at 1800  . amLODipine (NORVASC) 10 MG tablet Take 10 mg by mouth daily. **   Not Taking at Unknown time  . blood glucose meter kit and supplies Dispense based on patient and insurance preference. Use up to four times daily as directed. (FOR ICD-9 250.00, 250.01). 1 each 0 Taking  . glucose blood test strip 1 each by Other route 2 (two) times daily. Use as instructed   Taking  . lisinopril (PRINIVIL,ZESTRIL) 5 MG tablet Take 5 mg by mouth daily. **   Not Taking at Unknown time  . metoprolol (LOPRESSOR) 50 MG tablet Take 1.5 tablets (75 mg total) by mouth 2 (two) times daily. (Patient not taking: Reported on 08/24/2018) 90 tablet 6 Not Taking at Unknown time    Assessment: Pharmacy consulted to dose warfarin for patient with atrial fibrillation INR 2.22, therapeutic.   Home dose is listed as 4.5 mg on Wed and Sat  and 3 mg ROW.  Goal of Therapy:  INR 2-3 Monitor platelets by anticoagulation protocol: Yes   Plan:  Warfarin 4.5 mg x 1 today Monitor daily INR and s/s of bleeding  Isac Sarna, BS [place], BCPS Clinical Pharmacist Pager [phone] 09/01/2018,10:58 AM

## 2018-09-01 NOTE — Progress Notes (Signed)
PROGRESS NOTE  Amber Armstrong ZOX:[id number] DOB: 1951-10-12 DOA: 08/27/2018 PCP: Wilson Singer, MD  Brief History: 67 year old female with a history of diastolic CHF, diabetes mellitus type 2, chronic atrial fibrillation on warfarin, hypothyroidism, hypertension, hyperlipidemia presenting with 2 to 3-week history of shortness of breath and orthopnea. The patient states that she was instructed to decrease her furosemide from 20 mg twice daily to 20 mg daily approximately 1 month prior to this admission secondary to worsening renal function. She endorses compliance with all her medications, but states that she often does not follow a cardiac type diet. She denies any fevers, chills, chest pain, nausea, vomiting, diarrhea, abdominal pain, dysuria, hematuria, dizziness, headache, neck pain. Upon presentation, the patient was noted to have oxygen saturation in the 80s. She was afebrile hemodynamically stable. BNP was 2597. Chest x-ray showed pulmonary edema. Patient wasstarted onIV furosemide. On afternoon 09/01/18 she developed respiratory distress.  Her oxygen was increased to 7-8L HFNC  Assessment/Plan: Acute on chronic diastolic CHF -Primarily right heart failure/cor pulmonale? -07/05/2018 echo EF 60 to 65%, no WMA, moderate TR, PASP 79, moderate pericardial effusion -Continue furosemide IV 40 mg twice daily -Daily weights--NEG 2.5 kg -Accurate I's and O's  Acute respiratory failure with hypoxia -Secondary to pulmonary edema -Wean oxygen back to room as tolerated -09/01/18--developed respiratory distress--increased to 7-8L HFNC -repeat CXR -repeat BNP  Acute on chronic renal failure--CKD stage III -Previous renal baseline 1.3-1.6 -The patient likely has progression of her underlying renal disease -Renal ultrasound--neg hydronephrosis -Holding lisinopril  Acute metabolic encephalopathy -due to hypoxia and worsening renal failure -UA and culture -a little  confused today  Diabetes mellitus type 2, controlled -05/30/2018 hemoglobin A 1 C--6.4 -Continue reduced dose Lantus -NovoLog sliding scale  Chronic atrial fibrillation -Rate controlled -restart metoprolol tartrate -Continue warfarin  Hypothyroidism -Continue Synthroid  Morbid obesity -BMI 40.80 -Lifestyle modification    Disposition Plan:SNFin 2-3days  Family Communication:Cousin updatedat bedside 11/7  Consultants:none  Code Status: FULL   DVT Prophylaxis:warfarin   Procedures: As Listed in Progress Note Above  Antibiotics: None     Subjective: Pt states breathing is worse today.  She c/o sob with dry cough.  Denies cp, n/v/d, abd pain, f/c, hemoptysis  Objective: Vitals:   09/01/18 0559 09/01/18 1349 09/01/18 1358 09/01/18 1406  BP:  108/73    Pulse:  (!) 111 96   Resp:  20 20   Temp:  97.7 F (36.5 C)    TempSrc:  Oral    SpO2:  (!) 85% 100% 95%  Weight: 92.3 kg     Height:        Intake/Output Summary (Last 24 hours) at 09/01/2018 1426 Last data filed at 09/01/2018 1300 Gross per 24 hour  Intake 720 ml  Output 750 ml  Net -30 ml   Weight change: -0.506 kg Exam:   General:  Pt is alert, follows commands appropriately, not in acute distress  HEENT: No icterus, No thrush, No neck mass, [place]/AT  Cardiovascular: IRRR, S1/S2, no rubs, no gallops  Respiratory: bibasilar crackles, mild wheeze bilateral  Abdomen: Soft/+BS, non tender, non distended, no guarding  Extremities: 1 + LE edema, No lymphangitis, No petechiae, No rashes, no synovitis   Data Reviewed: I have personally reviewed following labs and imaging studies Basic Metabolic Panel: Recent Labs  Lab 08/27/2018 1250 08/30/18 0154 08/31/18 0529 09/01/18 0657  NA 140 136 138 141  K 4.2 4.3 4.3 4.5  CL  107 108 109 109  CO2 24 19* 17* 21*  GLUCOSE 237* 166* 142* 120*  BUN 57* 57* 66* 65*  CREATININE 2.28* 2.32* 2.52* 2.67*  CALCIUM 8.7* 8.3* 8.0* 8.6*   MG  --   --  2.4  --    Liver Function Tests: No results for input(s): AST, ALT, ALKPHOS, BILITOT, PROT, ALBUMIN in the last 168 hours. No results for input(s): LIPASE, AMYLASE in the last 168 hours. No results for input(s): AMMONIA in the last 168 hours. Coagulation Profile: Recent Labs  Lab 08/28/2018 1250 08/30/18 0154 08/31/18 0529 09/01/18 0657  INR 1.86 1.98 2.38 2.22   CBC: Recent Labs  Lab 09/08/2018 1250 08/30/18 0154  WBC 6.0 5.8  HGB 14.5 13.0  HCT 50.6* 46.9*  MCV 83.6 84.5  PLT 240 217   Cardiac Enzymes: Recent Labs  Lab 09/09/2018 1250 09/09/2018 2021 08/30/18 0154 08/30/18 0714  TROPONINI 0.04* 0.04* 0.05* 0.05*   BNP: Invalid input(s): POCBNP CBG: Recent Labs  Lab 08/31/18 1126 08/31/18 1630 08/31/18 2141 09/01/18 0742 09/01/18 1105  GLUCAP 146* 111* 126* 106* 120*   HbA1C: No results for input(s): HGBA1C in the last 72 hours. Urine analysis:    Component Value Date/Time   COLORURINE YELLOW 09/16/2018 1416   APPEARANCEUR CLOUDY (A) 09/06/2018 1416   LABSPEC 1.015 08/31/2018 1416   PHURINE 5.0 09/06/2018 1416   GLUCOSEU 50 (A) 09/08/2018 1416   HGBUR MODERATE (A) 08/28/2018 1416   BILIRUBINUR NEGATIVE 09/16/2018 1416   KETONESUR NEGATIVE 09/07/2018 1416   PROTEINUR 100 (A) 09/08/2018 1416   UROBILINOGEN 0.2 03/17/2015 1159   NITRITE NEGATIVE 08/26/2018 1416   LEUKOCYTESUR NEGATIVE 09/18/2018 1416   Sepsis Labs: @LABRCNTIP (procalcitonin:4,lacticidven:4) )No results found for this or any previous visit (from the past 240 hour(s)).   Scheduled Meds: . chlorhexidine  15 mL Mouth Rinse BID  . furosemide  40 mg Oral Daily  . insulin aspart  0-5 Units Subcutaneous QHS  . insulin aspart  0-9 Units Subcutaneous TID WC  . insulin glargine  10 Units Subcutaneous Q2200  . levothyroxine  150 mcg Oral Q0600  . Living Better with Heart Failure Book   Does not apply Once  . mouth rinse  15 mL Mouth Rinse q12n4p  . warfarin  4.5 mg Oral Once  .  Warfarin - Pharmacist Dosing Inpatient   Does not apply q1800   Continuous Infusions:  Procedures/Studies: Dg Chest 2 View  Result Date: 09/16/2018 CLINICAL DATA:  67 year old female with lower extremity weakness, cough and shortness of breath. Multiple falls. EXAM: CHEST - 2 VIEW COMPARISON:  Portable chest 08/10/2017 and earlier. FINDINGS: Seated AP and lateral views of the chest. Moderate to severe cardiomegaly. Stable mediastinal contours. Indistinct pulmonary vasculature, increased at the lung bases. Patchy bibasilar opacity. No definite pleural effusion, consolidation. Visualized tracheal air column is within normal limits. Osteopenia. No acute osseous abnormality identified. Negative visible bowel gas pattern. IMPRESSION: Mild or developing pulmonary edema suspected along with basilar atelectasis. Moderate to severe cardiomegaly. Electronically Signed   By: Odessa Fleming M.D.   On: 09/10/2018 16:20   US Renal  Result Date: 08/31/2018 CLINICAL DATA:  Acute on chronic renal failure EXAM: RENAL / URINARY TRACT ULTRASOUND COMPLETE COMPARISON:  None. FINDINGS: Right Kidney: 8 cm length. There is generalized cortical thinning with smooth appearance. No hydronephrosis. Left Kidney: Renal measurements: 9.6 x 5.7 x 5.8 cm = volume: 167 mL. Echogenicity within normal limits. No mass or hydronephrosis visualized. Bladder: Moderately distended.  No wall  thickening or debris is seen. Small volume ascites seen about the liver. IMPRESSION: 1. Advanced right renal atrophy. 2. No hydronephrosis. 3. Moderate distension of the bladder. Electronically Signed   By: Marnee Spring M.D.   On: 08/31/2018 11:15    Catarina Hartshorn, DO  [facility] Pager [phone]  If 7PM-7AM, please contact night-coverage [email or web address] Password [email or web address] 09/01/2018, 2:26 PM   LOS: 3 days

## 2018-09-02 ENCOUNTER — Inpatient Hospital Stay (HOSPITAL_COMMUNITY): Payer: PPO

## 2018-09-02 DIAGNOSIS — I517 Cardiomegaly: Secondary | ICD-10-CM

## 2018-09-02 LAB — BLOOD GAS, ARTERIAL
Acid-base deficit: 7.8 mmol/L — ABNORMAL HIGH (ref 0.0–2.0)
BICARBONATE: 16.6 mmol/L — AB (ref 20.0–28.0)
Delivery systems: POSITIVE
Drawn by: 234301
EXPIRATORY PAP: 8
FIO2: 100
Inspiratory PAP: 20
O2 SAT: 99.5 %
PH ART: 7.115 — AB (ref 7.350–7.450)
PO2 ART: 251 mmHg — AB (ref 83.0–108.0)
Patient temperature: 37
pCO2 arterial: 65.8 mmHg (ref 32.0–48.0)

## 2018-09-02 LAB — GLUCOSE, CAPILLARY
GLUCOSE-CAPILLARY: 176 mg/dL — AB (ref 70–99)
GLUCOSE-CAPILLARY: 38 mg/dL — AB (ref 70–99)

## 2018-09-02 LAB — BASIC METABOLIC PANEL
Anion gap: 10 (ref 5–15)
BUN: 73 mg/dL — AB (ref 8–23)
CALCIUM: 8.7 mg/dL — AB (ref 8.9–10.3)
CO2: 21 mmol/L — ABNORMAL LOW (ref 22–32)
CREATININE: 3.19 mg/dL — AB (ref 0.44–1.00)
Chloride: 109 mmol/L (ref 98–111)
GFR calc non Af Amer: 14 mL/min — ABNORMAL LOW (ref >60)
GFR, EST AFRICAN AMERICAN: 16 mL/min — AB (ref >60)
Glucose, Bld: 67 mg/dL — ABNORMAL LOW (ref 70–99)
Potassium: 4.8 mmol/L (ref 3.5–5.1)
Sodium: 140 mmol/L (ref 135–145)

## 2018-09-02 LAB — MAGNESIUM: Magnesium: 2.5 mg/dL — ABNORMAL HIGH (ref 1.7–2.4)

## 2018-09-02 LAB — TROPONIN I: Troponin I: 0.09 ng/mL (ref <0.03)

## 2018-09-02 LAB — PROTIME-INR
INR: 3.16
PROTHROMBIN TIME: 32 s — AB (ref 11.4–15.2)

## 2018-09-02 MED ORDER — MIDODRINE HCL 5 MG PO TABS
10.0000 mg | ORAL_TABLET | Freq: Once | ORAL | Status: AC
  Administered 2018-09-02: 10 mg via ORAL
  Filled 2018-09-02: qty 2

## 2018-09-02 MED ORDER — LORAZEPAM 2 MG/ML IJ SOLN
0.5000 mg | Freq: Once | INTRAMUSCULAR | Status: AC | PRN
  Administered 2018-09-02: 0.5 mg via INTRAVENOUS
  Filled 2018-09-02: qty 1

## 2018-09-02 MED ORDER — LEVALBUTEROL HCL 0.63 MG/3ML IN NEBU: 0.6300 mg | INHALATION_SOLUTION | Freq: Four times a day (QID) | RESPIRATORY_TRACT | Status: DC | PRN

## 2018-09-02 MED ORDER — LORAZEPAM 2 MG/ML IJ SOLN
0.5000 mg | INTRAMUSCULAR | Status: DC | PRN
  Administered 2018-09-02: 0.5 mg via INTRAVENOUS
  Filled 2018-09-02: qty 1

## 2018-09-02 MED ORDER — FUROSEMIDE 10 MG/ML IJ SOLN
60.0000 mg | Freq: Once | INTRAMUSCULAR | Status: AC
  Administered 2018-09-02: 60 mg via INTRAVENOUS
  Filled 2018-09-02: qty 6

## 2018-09-02 MED ORDER — IPRATROPIUM-ALBUTEROL 0.5-2.5 (3) MG/3ML IN SOLN
3.0000 mL | Freq: Four times a day (QID) | RESPIRATORY_TRACT | Status: DC
  Administered 2018-09-02 (×2): 3 mL via RESPIRATORY_TRACT
  Filled 2018-09-02: qty 3

## 2018-09-02 MED ORDER — DEXTROSE 50 % IV SOLN
INTRAVENOUS | Status: AC
  Administered 2018-09-02: 50 mL
  Filled 2018-09-02: qty 50

## 2018-09-02 MED ORDER — MORPHINE SULFATE (PF) 2 MG/ML IV SOLN
2.0000 mg | INTRAVENOUS | Status: DC | PRN
  Administered 2018-09-02: 2 mg via INTRAVENOUS
  Filled 2018-09-02: qty 1

## 2018-09-23 NOTE — Progress Notes (Signed)
CRITICAL VALUE ALERT  Critical Value:  PH- 7.11 pCO2 65.8 pO2 251 O2 99.5 Cicarb 16.6  Date & Time Notied:  2018-09-10 0830  Provider Notified: Dr. Arbutus Leas  Orders Received/Actions taken: per Dr. Arbutus Leas patient to be on comfort care.  No further orders received

## 2018-09-23 NOTE — Progress Notes (Signed)
PROGRESS NOTE  Amber Armstrong VWU:[id number] DOB: July 27, 1951 DOA: 09/13/2018 PCP: Wilson Singer, MD  Brief History: 67 year old female with a history of diastolic CHF, diabetes mellitus type 2, chronic atrial fibrillation on warfarin, hypothyroidism, hypertension, hyperlipidemia presenting with 2 to 3-week history of shortness of breath and orthopnea. The patient states that she was instructed to decrease her furosemide from 20 mg twice daily to 20 mg daily approximately 1 month prior to this admission secondary to worsening renal function. She endorses compliance with all her medications, but states that she often does not follow a cardiac type diet. She denies any fevers, chills, chest pain, nausea, vomiting, diarrhea, abdominal pain, dysuria, hematuria, dizziness, headache, neck pain. Upon presentation, the patient was noted to have oxygen saturation in the 80s. She was afebrile hemodynamically stable. BNP was 2597. Chest x-ray showed pulmonary edema. Patient wasstarted onIV furosemide. On afternoon 09/01/18 she developed respiratory distress.  Her oxygen was increased to 7-8L HFNC.  The patient was restarted back on intravenous furosemide at that time.  In the early morning 2018/10/01, the patient's respiratory status continued to deteriorate.  She was placed on BiPAP.  Additional furosemide was given.  Later in the morning on Oct 01, 2018, the patient's mental status worsened, and she was difficult to arouse even on BiPAP.  She was noted to have a low CBG which was treated appropriately.  However her mental status did not change.  The patient was noted to be hypotensive with systolic blood pressures in the upper 70s.  The patient's sister who is her next of can and primary decision maker was contacted regarding the patient's medical condition.  The patient's sister was updated on her medical condition.  We discussed the patient's overall poor prognosis in the setting of her right  heart failure, CKD, chronic atrial fibrillation and other comorbidities.  We discussed the patient's continued clinical decline despite optimal treatment.  We also discussed the patient's low likelihood to return back to her premorbid functional status.  In light of the patient's current medical condition and discussion, the patient's sister thought it was in the best interest to change the patient's focus of care to focus on her for comfort.  As a result, all medications for curative intent were discontinued.  Assessment/Plan: Acute on chronic diastolic CHF -Primarily right heart failure/cor pulmonale? -07/05/2018 echo EF 60 to 65%, no WMA, moderate TR, PASP 79, moderate pericardial effusion -Continue furosemide IV 40 mg twice daily -Remains clinically fluid overloaded despite additional IV furosemide -Daily weights--NEG 2.5 kg -Accurate I's and O's  Acute respiratory failure with hypoxia -Secondary to pulmonary edema -Wean oxygen back to room as tolerated -09/01/18--developed respiratory distress--increased to 7-8L HFNC -A.m. 10-01-2018--continued clinical decline--placed on BiPAP -repeat CXR--personally reviewed--increased interstitial markings, cardiomegaly -repeat BNP  Acute on chronic renal failure--CKD stage III -Previous renal baseline 1.3-1.6 -The patient likely has progression of her underlying renal disease -Renal ultrasound--neg hydronephrosis -Holding lisinopril -Continue with worsening in the setting of diuresis  Acute metabolic encephalopathy -due to hypoxia and worsening renal failure  Diabetes mellitus type 2, controlled -05/30/2018 hemoglobin A 1 C--6.4 -Continue reduced dose Lantus--> discontinued secondary to hypoglycemia -NovoLog sliding scale  Chronic atrial fibrillation -Rate controlled -restart metoprolol tartrate--d/c due to hypotension -Continue warfarin  Hypothyroidism -Continue Synthroid  Morbid obesity -BMI 40.80 -Lifestyle  modification    Disposition Plan:FULL comfort--in hospital death Family Communication:sister updated 11/10 Consultants:none  Code Status: FULL   DVT Prophylaxis:warfarin   Procedures:  As Listed in Progress Note Above  Antibiotics: None     Subjective: Patient is encephalopathic unable to obtain review of systems.  She grimaces to put her pathic stimuli  Objective: Vitals:   09/17/2018 0346 09/09/2018 0434 09/16/2018 0445 09/18/2018 0502  BP: 91/69 91/68    Pulse: 71 71  65  Resp: 16 (!) 22  20  Temp:  (!) 97.5 F (36.4 C)    TempSrc:  Oral    SpO2: 95% (!) 88%  93%  Weight:   95.2 kg   Height:        Intake/Output Summary (Last 24 hours) at 08/30/2018 0851 Last data filed at 08/30/2018 0502 Gross per 24 hour  Intake 960 ml  Output 1200 ml  Net -240 ml   Weight change: 2.9 kg Exam:   General:  Pt is alert, follows commands appropriately, not in acute distress  HEENT: No icterus, No thrush, No neck mass, [place]/AT  Cardiovascular: RRR, S1/S2, no rubs, no gallops  Respiratory: Bibasilar rales.  Diminished breath sounds bilateral.  Abdomen: Soft/+BS, non tender, non distended, no guarding  Extremities: 1 + LE edema, No lymphangitis, No petechiae, No rashes, no synovitis   Data Reviewed: I have personally reviewed following labs and imaging studies Basic Metabolic Panel: Recent Labs  Lab 09/18/2018 1250 08/30/18 0154 08/31/18 0529 09/01/18 0657 09/14/2018 0357  NA 140 136 138 141 140  K 4.2 4.3 4.3 4.5 4.8  CL 107 108 109 109 109  CO2 24 19* 17* 21* 21*  GLUCOSE 237* 166* 142* 120* 67*  BUN 57* 57* 66* 65* 73*  CREATININE 2.28* 2.32* 2.52* 2.67* 3.19*  CALCIUM 8.7* 8.3* 8.0* 8.6* 8.7*  MG  --   --  2.4  --  2.5*   Liver Function Tests: No results for input(s): AST, ALT, ALKPHOS, BILITOT, PROT, ALBUMIN in the last 168 hours. No results for input(s): LIPASE, AMYLASE in the last 168 hours. No results for input(s): AMMONIA in the last  168 hours. Coagulation Profile: Recent Labs  Lab 09/17/2018 1250 08/30/18 0154 08/31/18 0529 09/01/18 0657 08/30/2018 0357  INR 1.86 1.98 2.38 2.22 3.16   CBC: Recent Labs  Lab 08/28/2018 1250 08/30/18 0154 09/01/18 1450  WBC 6.0 5.8 5.0  HGB 14.5 13.0 13.8  HCT 50.6* 46.9* 51.6*  MCV 83.6 84.5 88.4  PLT 240 217 163   Cardiac Enzymes: Recent Labs  Lab 09/13/2018 1250 09/09/2018 2021 08/30/18 0154 08/30/18 0714 09/17/2018 0357  TROPONINI 0.04* 0.04* 0.05* 0.05* 0.09*   BNP: Invalid input(s): POCBNP CBG: Recent Labs  Lab 09/01/18 1105 09/01/18 1624 09/01/18 2117 09/18/2018 0739 09/11/2018 0756  GLUCAP 120* 106* 109* 38* 176*   HbA1C: No results for input(s): HGBA1C in the last 72 hours. Urine analysis:    Component Value Date/Time   COLORURINE YELLOW 09/04/2018 1416   APPEARANCEUR CLOUDY (A) 09/06/2018 1416   LABSPEC 1.015 09/01/2018 1416   PHURINE 5.0 08/26/2018 1416   GLUCOSEU 50 (A) 09/18/2018 1416   HGBUR MODERATE (A) 09/01/2018 1416   BILIRUBINUR NEGATIVE 09/01/2018 1416   KETONESUR NEGATIVE 08/26/2018 1416   PROTEINUR 100 (A) 09/10/2018 1416   UROBILINOGEN 0.2 03/17/2015 1159   NITRITE NEGATIVE 09/11/2018 1416   LEUKOCYTESUR NEGATIVE 08/26/2018 1416   Sepsis Labs: @LABRCNTIP (procalcitonin:4,lacticidven:4) )No results found for this or any previous visit (from the past 240 hour(s)).   Scheduled Meds: . chlorhexidine  15 mL Mouth Rinse BID  . furosemide  40 mg Intravenous BID  . insulin aspart  0-5  Units Subcutaneous QHS  . insulin aspart  0-9 Units Subcutaneous TID WC  . ipratropium-albuterol  3 mL Nebulization Q6H  . levothyroxine  150 mcg Oral Q0600  . Living Better with Heart Failure Book   Does not apply Once  . mouth rinse  15 mL Mouth Rinse q12n4p  . Warfarin - Pharmacist Dosing Inpatient   Does not apply q1800   Continuous Infusions:  Procedures/Studies: Dg Chest 2 View  Result Date: 09/06/2018 CLINICAL DATA:  67 year old female with lower  extremity weakness, cough and shortness of breath. Multiple falls. EXAM: CHEST - 2 VIEW COMPARISON:  Portable chest 08/10/2017 and earlier. FINDINGS: Seated AP and lateral views of the chest. Moderate to severe cardiomegaly. Stable mediastinal contours. Indistinct pulmonary vasculature, increased at the lung bases. Patchy bibasilar opacity. No definite pleural effusion, consolidation. Visualized tracheal air column is within normal limits. Osteopenia. No acute osseous abnormality identified. Negative visible bowel gas pattern. IMPRESSION: Mild or developing pulmonary edema suspected along with basilar atelectasis. Moderate to severe cardiomegaly. Electronically Signed   By: Odessa Fleming M.D.   On: 09/12/2018 16:20   US Renal  Result Date: 08/31/2018 CLINICAL DATA:  Acute on chronic renal failure EXAM: RENAL / URINARY TRACT ULTRASOUND COMPLETE COMPARISON:  None. FINDINGS: Right Kidney: 8 cm length. There is generalized cortical thinning with smooth appearance. No hydronephrosis. Left Kidney: Renal measurements: 9.6 x 5.7 x 5.8 cm = volume: 167 mL. Echogenicity within normal limits. No mass or hydronephrosis visualized. Bladder: Moderately distended.  No wall thickening or debris is seen. Small volume ascites seen about the liver. IMPRESSION: 1. Advanced right renal atrophy. 2. No hydronephrosis. 3. Moderate distension of the bladder. Electronically Signed   By: Marnee Spring M.D.   On: 08/31/2018 11:15   Dg Chest Port 1 View  Result Date: 08/30/2018 CLINICAL DATA:  Acute onset of dyspnea. EXAM: PORTABLE CHEST 1 VIEW COMPARISON:  Chest radiograph performed 09/01/2018 FINDINGS: The lungs are well-aerated. A small left pleural effusion is suspected. Mild vascular congestion is noted. Retrocardiac airspace opacity may reflect atelectasis or possibly pneumonia. There is no evidence of pneumothorax. The cardiomediastinal silhouette is significantly enlarged. No acute osseous abnormalities are seen. IMPRESSION: Small  left pleural effusion suspected. Mild vascular congestion and significant cardiomegaly noted. Retrocardiac airspace opacity may reflect atelectasis or possibly pneumonia. Electronically Signed   By: Roanna Raider M.D.   On: 09/04/2018 04:42   Dg Chest Port 1 View  Result Date: 09/01/2018 CLINICAL DATA:  Acute respiratory failure with hypoxia. EXAM: PORTABLE CHEST 1 VIEW COMPARISON:  Radiograph of August 29, 2018. FINDINGS: Stable cardiomegaly. Mild central pulmonary vascular congestion is noted. No pneumothorax is noted. Minimal pleural effusions may be present. Bony thorax is unremarkable. IMPRESSION: Stable cardiomegaly with mild central pulmonary vascular congestion. Minimal pleural effusions may be present. Electronically Signed   By: Lupita Raider, M.D.   On: 09/01/2018 15:49    Catarina Hartshorn, DO  [facility] Pager [phone]  If 7PM-7AM, please contact night-coverage [email or web address] Password [email or web address] 08/31/2018, 8:51 AM   LOS: 4 days

## 2018-09-23 NOTE — Progress Notes (Signed)
Patient expired at 1325, death pronounced by Guy Begin, RN and Karolee Ohs, RN.  Dr Tat made aware.

## 2018-09-23 NOTE — Death Summary Note (Signed)
DEATH SUMMARY   Patient Details  Name: Amber Armstrong MRN: [id number] DOB: Nov 25, 1950  Admission/Discharge Information   Admit Date:  08-31-2018  Date of Death: Date of Death: 09-04-2018  Time of Death: Time of Death: 1325  Length of Stay: 4  Referring Physician: Wilson Singer, MD   Reason(s) for Hospitalization  dyspnea  Diagnoses  Preliminary cause of death: acute on chronic diastolic chf Secondary Diagnoses (including complications and co-morbidities):  Acute on chronic diastolic CHF -Primarily right heart failure/cor pulmonale? -07/05/2018 echo EF 60 to 65%, no WMA, moderate TR, PASP 79, moderate pericardial effusion -Continue furosemide IV 40 mg twice daily -Remains clinically fluid overloaded despite additional IV furosemide -Daily weights--NEG 2.5kg -Accurate I's and O's  Acute respiratory failure with hypoxia -Secondary to pulmonary edema -Wean oxygen back to room as tolerated -09/01/18--developed respiratory distress--increased to 7-8L HFNC -A.m. 09/04/18--continued clinical decline--placed on BiPAP -repeat CXR--personally reviewed--increased interstitial markings, cardiomegaly -repeat BNP  Acute on chronic renal failure--CKD stage III -Previous renal baseline 1.3-1.6 -The patient likely has progression of her underlying renal disease -Renal ultrasound--neg hydronephrosis -Holding lisinopril -Continue with worsening in the setting of diuresis  Acute metabolic encephalopathy -due to hypoxia and worsening renal failure  Diabetes mellitus type 2, controlled -05/30/2018 hemoglobin A 1 C--6.4 -Continue reduced dose Lantus--> discontinued secondary to hypoglycemia -NovoLog sliding scale  Chronic atrial fibrillation -Rate controlled -restartmetoprolol tartrate--d/c due to hypotension -Continue warfarin  Hypothyroidism -Continue Synthroid  Morbid obesity -BMI 40.80 -Lifestyle modification  Goals of Care -discussed with patient's  sister -transitioned to full comfort care 05-Sep-2023 after discussing pt's condition and poor prognosis  Brief Hospital Course (including significant findings, care, treatment, and services provided and events leading to death)  Amber Armstrong is a 67 y.o. year old female who 67 year old female with a history of diastolic CHF, diabetes mellitus type 2, chronic atrial fibrillation on warfarin, hypothyroidism, hypertension, hyperlipidemia presenting with 2 to 3-week history of shortness of breath and orthopnea. The patient states that she was instructed to decrease her furosemide from 20 mg twice daily to 20 mg daily approximately 1 month prior to this admission secondary to worsening renal function. She endorses compliance with all her medications, but states that she often does not follow a cardiac type diet. She denies any fevers, chills, chest pain, nausea, vomiting, diarrhea, abdominal pain, dysuria, hematuria, dizziness, headache, neck pain. Upon presentation, the patient was noted to have oxygen saturation in the 80s. She was afebrile hemodynamically stable. BNP was 2597. Chest x-ray showed pulmonary edema. Patient wasstarted onIV furosemide. On afternoon 09/01/18 she developed respiratory distress. Her oxygen was increased to 7-8L HFNC.  The patient was restarted back on intravenous furosemide at that time.  In the early morning 09-04-2018, the patient's respiratory status continued to deteriorate.  She was placed on BiPAP.  Additional furosemide was given.  Later in the morning on 09/04/18, the patient's mental status worsened, and she was difficult to arouse even on BiPAP.  She was noted to have a low CBG which was treated appropriately.  However her mental status did not change.  The patient was noted to be hypotensive with systolic blood pressures in the upper 70s.  The patient's sister who is her next of can and primary decision maker was contacted regarding the patient's medical condition.   The patient's sister was updated on her medical condition.  We discussed the patient's overall poor prognosis in the setting of her right heart failure, CKD, chronic atrial fibrillation and other comorbidities.  We discussed the patient's continued clinical decline despite optimal treatment.  We also discussed the patient's low likelihood to return back to her premorbid functional status.  In light of the patient's current medical condition and discussion, the patient's sister thought it was in the best interest to change the patient's focus of care to focus on her for comfort.  As a result, all medications for curative intent were discontinued.    Pertinent Labs and Studies  Significant Diagnostic Studies Dg Chest 1 View  Result Date: 09/13/2018 CLINICAL DATA:  CHF.  Follow-up exam. EXAM: CHEST  1 VIEW COMPARISON:  09/01/2018 and older studies. FINDINGS: Moderate to marked enlargement of the cardiopericardial silhouette, stable. There is vascular congestion with mild hazy central lung opacity and more confluent lung base opacity. The hemidiaphragms are obscured. These findings are similar to the previous day's exam allowing for differences in patient positioning. No pneumothorax. IMPRESSION: 1. Findings are similar to the previous day's study. 2. Moderate to marked cardiomegaly with vascular congestion and bilateral pleural effusions. Mild associated central and lower lung pulmonary edema is suspected along with presumed left greater than right lung base atelectasis. Electronically Signed   By: Amie Portland M.D.   On: 09/11/2018 09:16   Dg Chest 2 View  Result Date: 09/22/2018 CLINICAL DATA:  67 year old female with lower extremity weakness, cough and shortness of breath. Multiple falls. EXAM: CHEST - 2 VIEW COMPARISON:  Portable chest 08/10/2017 and earlier. FINDINGS: Seated AP and lateral views of the chest. Moderate to severe cardiomegaly. Stable mediastinal contours. Indistinct pulmonary  vasculature, increased at the lung bases. Patchy bibasilar opacity. No definite pleural effusion, consolidation. Visualized tracheal air column is within normal limits. Osteopenia. No acute osseous abnormality identified. Negative visible bowel gas pattern. IMPRESSION: Mild or developing pulmonary edema suspected along with basilar atelectasis. Moderate to severe cardiomegaly. Electronically Signed   By: Odessa Fleming M.D.   On: 09/15/2018 16:20   US Renal  Result Date: 08/31/2018 CLINICAL DATA:  Acute on chronic renal failure EXAM: RENAL / URINARY TRACT ULTRASOUND COMPLETE COMPARISON:  None. FINDINGS: Right Kidney: 8 cm length. There is generalized cortical thinning with smooth appearance. No hydronephrosis. Left Kidney: Renal measurements: 9.6 x 5.7 x 5.8 cm = volume: 167 mL. Echogenicity within normal limits. No mass or hydronephrosis visualized. Bladder: Moderately distended.  No wall thickening or debris is seen. Small volume ascites seen about the liver. IMPRESSION: 1. Advanced right renal atrophy. 2. No hydronephrosis. 3. Moderate distension of the bladder. Electronically Signed   By: Marnee Spring M.D.   On: 08/31/2018 11:15   Dg Chest Port 1 View  Result Date: 09/14/2018 CLINICAL DATA:  Acute onset of dyspnea. EXAM: PORTABLE CHEST 1 VIEW COMPARISON:  Chest radiograph performed 09/01/2018 FINDINGS: The lungs are well-aerated. A small left pleural effusion is suspected. Mild vascular congestion is noted. Retrocardiac airspace opacity may reflect atelectasis or possibly pneumonia. There is no evidence of pneumothorax. The cardiomediastinal silhouette is significantly enlarged. No acute osseous abnormalities are seen. IMPRESSION: Small left pleural effusion suspected. Mild vascular congestion and significant cardiomegaly noted. Retrocardiac airspace opacity may reflect atelectasis or possibly pneumonia. Electronically Signed   By: Roanna Raider M.D.   On: 08/27/2018 04:42   Dg Chest Port 1  View  Result Date: 09/01/2018 CLINICAL DATA:  Acute respiratory failure with hypoxia. EXAM: PORTABLE CHEST 1 VIEW COMPARISON:  Radiograph of August 29, 2018. FINDINGS: Stable cardiomegaly. Mild central pulmonary vascular congestion is noted. No pneumothorax is noted. Minimal pleural effusions  may be present. Bony thorax is unremarkable. IMPRESSION: Stable cardiomegaly with mild central pulmonary vascular congestion. Minimal pleural effusions may be present. Electronically Signed   By: Lupita Raider, M.D.   On: 09/01/2018 15:49    Microbiology No results found for this or any previous visit (from the past 240 hour(s)).  Lab Basic Metabolic Panel: Recent Labs  Lab Sep 06, 2018 1250 08/30/18 0154 08/31/18 0529 09/01/18 0657 08/30/2018 0357  NA 140 136 138 141 140  K 4.2 4.3 4.3 4.5 4.8  CL 107 108 109 109 109  CO2 24 19* 17* 21* 21*  GLUCOSE 237* 166* 142* 120* 67*  BUN 57* 57* 66* 65* 73*  CREATININE 2.28* 2.32* 2.52* 2.67* 3.19*  CALCIUM 8.7* 8.3* 8.0* 8.6* 8.7*  MG  --   --  2.4  --  2.5*   Liver Function Tests: No results for input(s): AST, ALT, ALKPHOS, BILITOT, PROT, ALBUMIN in the last 168 hours. No results for input(s): LIPASE, AMYLASE in the last 168 hours. No results for input(s): AMMONIA in the last 168 hours. CBC: Recent Labs  Lab 2018/09/06 1250 08/30/18 0154 09/01/18 1450  WBC 6.0 5.8 5.0  HGB 14.5 13.0 13.8  HCT 50.6* 46.9* 51.6*  MCV 83.6 84.5 88.4  PLT 240 217 163   Cardiac Enzymes: Recent Labs  Lab 09-06-18 1250 2018/09/06 2021 08/30/18 0154 08/30/18 0714 09/16/2018 0357  TROPONINI 0.04* 0.04* 0.05* 0.05* 0.09*   Sepsis Labs: Recent Labs  Lab 09/06/18 1250 08/30/18 0154 09/01/18 1450  PROCALCITON  --   --  0.14  WBC 6.0 5.8 5.0    Procedures/Operations  Left femoral central line 11/10   Aquilla Voiles 09/08/2018, 5:39 PM

## 2018-09-23 NOTE — Progress Notes (Signed)
PT was SOB and experiencing dyspnea at rest. PTs mouth filled with thick Teffeteller sputum. Oral care provided. O2 sat around 72% on 7LNC. PT placed on 10L NRB. PT CXR showed congestion. TROP was 0.09. PT bladder scanned and >941mL in bladder. Urinary catheter placed with 1100 out upon insertion. PT given 60 of Lasix IV. Pt placed on Bipap QHS. Continue to monitor.

## 2018-09-23 NOTE — Progress Notes (Cosign Needed)
Called to Room by RN for patients' worsening respiratory status. Patient was found to be in respiratory distress with Crackles and Rhonchi audible. A bladder scan revealed >1000 mL  She was on 15 LPM via NRBM and her sats were in the 88-90 % range. RT was called to the room and administered a nebulized breathing treatment. Orders were placed and completed and labs were drawn to include a BNP. EKG was obtained and showed a-fib in the 70s. Lasix was ordered and administered. Portable chest x-ray was obtained.  She rapidly improved and >824mL of urine was returned at the time of completion of event. Clear yellow urine continued to drain. BiPAP was ordered and patient is in the 90s SPO2 on 12LPM [place].  Her wheezing had improved and her mentation continues to improve. She is able to converse now. She remains on 300 unit.

## 2018-09-23 NOTE — Progress Notes (Signed)
ANTICOAGULATION CONSULT NOTE - Pharmacy Consult for warfarin Indication: atrial fibrillation  Allergies  Allergen Reactions  . Penicillins Rash    Has patient had a PCN reaction causing immediate rash, facial/tongue/throat swelling, SOB or lightheadedness with hypotension: No Has patient had a PCN reaction causing severe rash involving mucus membranes or skin necrosis: Yes Has patient had a PCN reaction that required hospitalization: No Has patient had a PCN reaction occurring within the last 10 years: No If all of the above answers are "NO", then may proceed with Cephalosporin use.     Patient Measurements: Height: 5' (152.4 cm) Weight: 209 lb 14.1 oz (95.2 kg) IBW/kg (Calculated) : 45.5   Vital Signs: Temp: 97.5 F (36.4 C) (11/10 0434) Temp Source: Oral (11/10 0434) BP: 91/68 (11/10 0434) Pulse Rate: 65 (11/10 0853)  Labs: Recent Labs    08/31/18 0529 09/01/18 0657 09/01/18 1450 09/27/2018 0357  HGB  --   --  13.8  --   HCT  --   --  51.6*  --   PLT  --   --  163  --   LABPROT 25.7* 24.3*  --  32.0*  INR 2.38 2.22  --  3.16  CREATININE 2.52* 2.67*  --  3.19*  TROPONINI  --   --   --  0.09*    Estimated Creatinine Clearance: 17.4 mL/min (A) (by C-G formula based on SCr of 3.19 mg/dL (H)).   Medical History: Past Medical History:  Diagnosis Date  . Atrial fibrillation ([place])   . Chronic diastolic heart failure ([place])   . Hyperglycemia without ketosis 03/17/2015  . Hypertension   . Hypothyroidism (acquired)   . Nephrolithiasis   . Overweight(278.02)   . Right ventricular hypertrophy 12/12/2012  . Type 2 diabetes mellitus ([facility])    Type II  . UTI (urinary tract infection)     Medications:  Medications Prior to Admission  Medication Sig Dispense Refill Last Dose  . furosemide (LASIX) 20 MG tablet Take 20 mg by mouth 2 (two) times daily.    08/28/2018 at Unknown time  . levothyroxine (SYNTHROID, LEVOTHROID) 150 MCG tablet Take 150 mcg by mouth daily before  breakfast.   08/28/2018 at Unknown time  . warfarin (COUMADIN) 3 MG tablet Take 1 1/2 tablets daily or as directed (Patient taking differently: Take 3-4.5 mg by mouth See admin instructions. 21m on all days except on Wednesdays and Saturdays. Take 4.576mon Wednesdays and Saturdays only) 45 tablet 0 08/28/2018 at 1800  . amLODipine (NORVASC) 10 MG tablet Take 10 mg by mouth daily. **   Not Taking at Unknown time  . blood glucose meter kit and supplies Dispense based on patient and insurance preference. Use up to four times daily as directed. (FOR ICD-9 250.00, 250.01). 1 each 0 Taking  . glucose blood test strip 1 each by Other route 2 (two) times daily. Use as instructed   Taking  . lisinopril (PRINIVIL,ZESTRIL) 5 MG tablet Take 5 mg by mouth daily. **   Not Taking at Unknown time  . metoprolol (LOPRESSOR) 50 MG tablet Take 1.5 tablets (75 mg total) by mouth 2 (two) times daily. (Patient not taking: Reported on 08/24/2018) 90 tablet 6 Not Taking at Unknown time    Assessment: Pharmacy consulted to dose warfarin for patient with atrial fibrillation INR 2.22>>3.16.  Increased INR possibly d/t 39m23m 1 dose given 11/6 otherwise not on other medications to increase INR. Home dose is listed as 4.5 mg on Wed and Sat  and 3 mg ROW.  Goal of Therapy:  INR 2-3 Monitor platelets by anticoagulation protocol: Yes   Plan:  No coumadin today Monitor daily INR and s/s of bleeding  Isac Sarna, BS Vena [place], BCPS Clinical Pharmacist Pager [phone] 2018-09-12,10:39 AM

## 2018-09-23 NOTE — Progress Notes (Signed)
CRITICAL VALUE ALERT  Critical Value: troponin 0.09  Date & Time Notied:  08/30/2018 8099  Provider Notified: dr. Robb Matar, [phone]  Orders Received/Actions taken: MD made aware of patient troponin.   MD on floor to see patient. Patient currently being seen by respiratory and nurse.  MD has made orders for treatment.  Will continue to monitor patient.

## 2018-09-23 NOTE — Progress Notes (Signed)
Night shift floor coverage note.  Patient was seen due to respiratory distress with audible wheezing, rhonchi and crackles on auscultation.  Her O2 sat was in the high 80s to low 90s and she responded to NRB and not 15 LPM of oxygen.  Bladder scan showed thousand mL and a Foley was subsequently placed.   Chest radiograph was done which showed significant cardiomegaly, mild vascular congestion, small left pleural effusion and retrocardiac space opacity which could reflect atelectasis or pneumonia.  Troponin was 0.09 ng/mL, likely due to demand ischemia.  EKG was not significantly changed.  Furosemide 60 mg IVP x1 dose, Midodrin 10 mg p.o. x1, a DuoNeb, BiPAP ventilation followed by 0.5 mg IVP of Ativan have improved the patient's status.  Her metoprolol was discontinued.  Sanda Klein, MD

## 2018-09-23 DEATH — deceased

## 2018-10-25 ENCOUNTER — Ambulatory Visit: Payer: PPO | Admitting: "Endocrinology

## 2018-10-25 ENCOUNTER — Ambulatory Visit: Payer: PPO | Admitting: Nutrition
